# Patient Record
Sex: Female | Born: 1960
Health system: Southern US, Community
[De-identification: ages and names within clinical notes are randomized; demographics above are authoritative.]

## PROBLEM LIST (undated history)

## (undated) DIAGNOSIS — F419 Anxiety disorder, unspecified: Secondary | ICD-10-CM

## (undated) DIAGNOSIS — L719 Rosacea, unspecified: Secondary | ICD-10-CM

## (undated) DIAGNOSIS — M1990 Unspecified osteoarthritis, unspecified site: Secondary | ICD-10-CM

## (undated) DIAGNOSIS — J309 Allergic rhinitis, unspecified: Secondary | ICD-10-CM

## (undated) DIAGNOSIS — E2839 Other primary ovarian failure: Secondary | ICD-10-CM

## (undated) DIAGNOSIS — M47812 Spondylosis without myelopathy or radiculopathy, cervical region: Secondary | ICD-10-CM

## (undated) DIAGNOSIS — C801 Malignant (primary) neoplasm, unspecified: Secondary | ICD-10-CM

## (undated) DIAGNOSIS — E785 Hyperlipidemia, unspecified: Secondary | ICD-10-CM

## (undated) DIAGNOSIS — B009 Herpesviral infection, unspecified: Secondary | ICD-10-CM

## (undated) DIAGNOSIS — K5790 Diverticulosis of intestine, part unspecified, without perforation or abscess without bleeding: Secondary | ICD-10-CM

## (undated) DIAGNOSIS — G5600 Carpal tunnel syndrome, unspecified upper limb: Secondary | ICD-10-CM

## (undated) DIAGNOSIS — Z973 Presence of spectacles and contact lenses: Secondary | ICD-10-CM

## (undated) DIAGNOSIS — K219 Gastro-esophageal reflux disease without esophagitis: Secondary | ICD-10-CM

## (undated) DIAGNOSIS — C786 Secondary malignant neoplasm of retroperitoneum and peritoneum: Secondary | ICD-10-CM

## (undated) HISTORY — DX: Carpal tunnel syndrome, unspecified upper limb: G56.00

## (undated) HISTORY — DX: Other primary ovarian failure: E28.39

## (undated) HISTORY — DX: Anxiety disorder, unspecified: F41.9

## (undated) HISTORY — DX: Rosacea, unspecified: L71.9

## (undated) HISTORY — DX: Diverticulosis of intestine, part unspecified, without perforation or abscess without bleeding: K57.90

## (undated) HISTORY — DX: Spondylosis without myelopathy or radiculopathy, cervical region: M47.812

## (undated) HISTORY — PX: ANTERIOR CRUCIATE LIGAMENT REPAIR: SHX115

## (undated) HISTORY — DX: Hyperlipidemia, unspecified: E78.5

## (undated) HISTORY — DX: Allergic rhinitis, unspecified: J30.9

## (undated) HISTORY — DX: Herpesviral infection, unspecified: B00.9

---

## 2012-12-04 DIAGNOSIS — M5431 Sciatica, right side: Secondary | ICD-10-CM | POA: Insufficient documentation

## 2012-12-04 DIAGNOSIS — M1990 Unspecified osteoarthritis, unspecified site: Secondary | ICD-10-CM | POA: Insufficient documentation

## 2014-07-02 DIAGNOSIS — F419 Anxiety disorder, unspecified: Secondary | ICD-10-CM | POA: Insufficient documentation

## 2015-01-20 LAB — HM MAMMOGRAPHY

## 2015-02-06 LAB — HM PAP SMEAR: HM PAP: NEGATIVE

## 2016-01-02 DIAGNOSIS — C786 Secondary malignant neoplasm of retroperitoneum and peritoneum: Secondary | ICD-10-CM

## 2016-01-02 HISTORY — DX: Secondary malignant neoplasm of retroperitoneum and peritoneum: C78.6

## 2016-01-11 ENCOUNTER — Emergency Department
Admission: EM | Admit: 2016-01-11 | Discharge: 2016-01-11 | Disposition: A | Discharge status: Home / Self Care | Payer: BLUE CROSS/BLUE SHIELD | Attending: Emergency Medicine | Admitting: Emergency Medicine

## 2016-01-11 ENCOUNTER — Emergency Department: Payer: BLUE CROSS/BLUE SHIELD

## 2016-01-11 DIAGNOSIS — R1084 Generalized abdominal pain: Secondary | ICD-10-CM

## 2016-01-11 DIAGNOSIS — R188 Other ascites: Secondary | ICD-10-CM | POA: Diagnosis not present

## 2016-01-11 DIAGNOSIS — Z87891 Personal history of nicotine dependence: Secondary | ICD-10-CM | POA: Insufficient documentation

## 2016-01-11 DIAGNOSIS — R1010 Upper abdominal pain, unspecified: Secondary | ICD-10-CM | POA: Diagnosis present

## 2016-01-11 HISTORY — DX: Gastro-esophageal reflux disease without esophagitis: K21.9

## 2016-01-11 LAB — COMPREHENSIVE METABOLIC PANEL
ALK PHOS: 84 U/L (ref 38–126)
ALT: 11 U/L — ABNORMAL LOW (ref 14–54)
ANION GAP: 8 (ref 5–15)
AST: 17 U/L (ref 15–41)
Albumin: 3.5 g/dL (ref 3.5–5.0)
BUN: 10 mg/dL (ref 6–20)
CALCIUM: 8.8 mg/dL — AB (ref 8.9–10.3)
CHLORIDE: 102 mmol/L (ref 101–111)
CO2: 26 mmol/L (ref 22–32)
Creatinine, Ser: 0.72 mg/dL (ref 0.44–1.00)
Glucose, Bld: 93 mg/dL (ref 65–99)
Potassium: 3.7 mmol/L (ref 3.5–5.1)
SODIUM: 136 mmol/L (ref 135–145)
Total Bilirubin: 0.6 mg/dL (ref 0.3–1.2)
Total Protein: 7.2 g/dL (ref 6.5–8.1)

## 2016-01-11 LAB — CBC
HCT: 34.7 % — ABNORMAL LOW (ref 35.0–47.0)
HEMOGLOBIN: 11.9 g/dL — AB (ref 12.0–16.0)
MCH: 27.6 pg (ref 26.0–34.0)
MCHC: 34.4 g/dL (ref 32.0–36.0)
MCV: 80.2 fL (ref 80.0–100.0)
Platelets: 393 10*3/uL (ref 150–440)
RBC: 4.32 MIL/uL (ref 3.80–5.20)
RDW: 14.6 % — ABNORMAL HIGH (ref 11.5–14.5)
WBC: 7.1 10*3/uL (ref 3.6–11.0)

## 2016-01-11 LAB — URINALYSIS COMPLETE WITH MICROSCOPIC (ARMC ONLY)
BILIRUBIN URINE: NEGATIVE
Bacteria, UA: NONE SEEN
Glucose, UA: NEGATIVE mg/dL
HGB URINE DIPSTICK: NEGATIVE
LEUKOCYTES UA: NEGATIVE
Nitrite: NEGATIVE
PH: 5 (ref 5.0–8.0)
PROTEIN: 30 mg/dL — AB
SPECIFIC GRAVITY, URINE: 1.028 (ref 1.005–1.030)

## 2016-01-11 LAB — TROPONIN I: Troponin I: <0.03 ng/mL (ref <0.03)

## 2016-01-11 LAB — LIPASE, BLOOD: LIPASE: 23 U/L (ref 11–51)

## 2016-01-11 MED ORDER — IOPAMIDOL (ISOVUE-300) INJECTION 61%
100.0000 mL | Freq: Once | INTRAVENOUS | Status: AC | PRN
  Administered 2016-01-11: 100 mL via INTRAVENOUS

## 2016-01-11 MED ORDER — SODIUM CHLORIDE 0.9 % IV BOLUS (SEPSIS)
1000.0000 mL | Freq: Once | INTRAVENOUS | Status: AC
  Administered 2016-01-11: 1000 mL via INTRAVENOUS

## 2016-01-11 MED ORDER — DIATRIZOATE MEGLUMINE & SODIUM 66-10 % PO SOLN
15.0000 mL | Freq: Once | ORAL | Status: AC
  Administered 2016-01-11: 15 mL via ORAL

## 2016-01-11 NOTE — ED Notes (Signed)
Pt c/o upper abd pain, worse after eating and bloating, denies vomiting diarrhea. States having regular BM.Marland Kitchen States she was placed on protonix for acid reflux able 4 weeks ago. Also PCP was talking about possible gall bladder issues.

## 2016-01-11 NOTE — ED Provider Notes (Signed)
[facility] Emergency Department Provider Note   ____________________________________________  Time seen: Approximately 5 PM  I have reviewed the triage vital signs and the nursing notes.   HISTORY  Chief Complaint Abdominal Pain   HPI Valerie Horton is a 55 y.o. female with a history of acid reflux was presenting to the emergency department with abdominal pain. She says that the pain is been ongoing for several weeks. She says that she was placed on Protonix which helped a burning chest pain that she was having but the abdominal pain persisted. She says it worsens after eating and she often feels nauseous. She is not having any nausea or pain at this time. Says that the abdominal pain is also associated with distention. Says that she has also had loose stools but without bloody stools. Says that the pain is sharp and often radiates under her left breast. It is worsened with eating. She says that she was also put on simethicone which also has not helped with the abdominal pain. She is coming in the emergency department today after her husband brought her in because of concern for persistent symptoms.She said that she had a colonoscopy about a year ago which showed some diverticulosis as well as polyps. She says that this was a routine screening.   Past Medical History  Diagnosis Date  . Acid reflux     There are no active problems to display for this patient.   Past Surgical History  Procedure Laterality Date  . Cesarean section      No current outpatient prescriptions on file.  Allergies Review of patient's allergies indicates no known allergies.  No family history on file.  Social History Social History  Substance Use Topics  . Smoking status: Former Research scientist (life sciences)  . Smokeless tobacco: None  . Alcohol Use: Yes    Review of Systems Constitutional: No fever/chills Eyes: No visual changes. ENT: No sore throat. Cardiovascular: Denies chest  pain. Respiratory: Denies shortness of breath. Gastrointestinal:no vomiting.  No constipation. Genitourinary: Negative for dysuria. Musculoskeletal: Negative for back pain. Skin: Negative for rash. Neurological: Negative for headaches, focal weakness or numbness.  10-point ROS otherwise negative.  ____________________________________________   PHYSICAL EXAM:  VITAL SIGNS: ED Triage Vitals  Enc Vitals Group     BP 01/11/16 1520 125/55 mmHg     Pulse Rate 01/11/16 1520 93     Resp 01/11/16 1520 16     Temp 01/11/16 1520 98.5 F (36.9 C)     Temp Source 01/11/16 1520 Oral     SpO2 01/11/16 1520 98 %     Weight 01/11/16 1520 150 lb (68.04 kg)     Height 01/11/16 1520 5\' 3"  (1.6 m)     Head Cir --      Peak Flow --      Pain Score --      Pain Loc --      Pain Edu? --      Excl. in [place]? --     Constitutional: Alert and oriented. Well appearing and in no acute distress. Eyes: Conjunctivae are normal. PERRL. EOMI. Head: Atraumatic. Nose: No congestion/rhinnorhea. Mouth/Throat: Mucous membranes are moist.   Neck: No stridor.   Cardiovascular: Normal rate, regular rhythm. Grossly normal heart sounds.  Good peripheral circulation. Respiratory: Normal respiratory effort.  No retractions. Lungs CTAB. Gastrointestinal: Soft With lower abdominal tenderness to palpation without any rebound or guarding. The tenderness is across the lower abdomen. Mild distention. No CVA tenderness. Musculoskeletal:  No lower extremity tenderness nor edema.  No joint effusions. Neurologic:  Normal speech and language. No gross focal neurologic deficits are appreciated.  Skin:  Skin is warm, dry and intact. No rash noted. Psychiatric: Mood and affect are normal. Speech and behavior are normal.  ____________________________________________   LABS (all labs ordered are listed, but only abnormal results are displayed)  Labs Reviewed  COMPREHENSIVE METABOLIC PANEL - Abnormal; Notable for the following:     Calcium 8.8 (*)    ALT 11 (*)    All other components within normal limits  CBC - Abnormal; Notable for the following:    Hemoglobin 11.9 (*)    HCT 34.7 (*)    RDW 14.6 (*)    All other components within normal limits  URINALYSIS COMPLETEWITH MICROSCOPIC ([facility] ONLY) - Abnormal; Notable for the following:    Color, Urine YELLOW (*)    APPearance CLEAR (*)    Ketones, ur 1+ (*)    Protein, ur 30 (*)    Squamous Epithelial / LPF 0-5 (*)    All other components within normal limits  LIPASE, BLOOD  TROPONIN I  CA 125  CEA  CA 19-9 (SERIAL)   ____________________________________________  EKG  ED ECG REPORT I, Doran Stabler, the attending physician, personally viewed and interpreted this ECG.   Date: 01/11/2016  EKG Time: 1800  Rate: 82  Rhythm: normal sinus rhythm  Axis: Normal  Intervals:none  ST&T Change: No ST segment elevation or depression. No abnormal T-wave inversion.  ____________________________________________  RADIOLOGY  CT Abdomen Pelvis W Contrast (Final result) Result time: 01/11/16 19:42:04   Final result by Rad Results In Interface (01/11/16 19:42:04)   Narrative:   CLINICAL DATA: Generalized abdominal pain for 4 months. Distension. Bloating.  EXAM: CT ABDOMEN AND PELVIS WITH CONTRAST  TECHNIQUE: Multidetector CT imaging of the abdomen and pelvis was performed using the standard protocol following bolus administration of intravenous contrast.  CONTRAST: 134mL ISOVUE-300 IOPAMIDOL (ISOVUE-300) INJECTION 61%  COMPARISON: None.  FINDINGS: Lower chest: Mild bibasilar atelectasis.  Hepatobiliary: Liver appears normal. Gallbladder is unremarkable, difficult to definitively characterize due to surrounding fluid.  Pancreas: Normal.  Spleen: Within normal limits in size and appearance.  Adrenals/Urinary Tract: Adrenal glands appear normal. Kidneys appear normal without mass, stone or hydronephrosis. No ureteral or  bladder calculi identified.  Stomach/Bowel: Bowel is normal in caliber. No bowel wall thickening or bowel wall mass identified. Appendix is difficult to characterize, mostly obscured by adjacent fluid.  Vascular/Lymphatic: No pathologically enlarged lymph nodes. No evidence of abdominal aortic aneurysm.  Reproductive: No mass or other significant abnormality.  Other: Large volume free fluid throughout the abdomen and pelvis. Suspect nodular soft tissue thickening (omental caking) within the anterior mesentery, suggesting pseudomyxoma peritonei. No circumscribed fluid collection or abscess like collection. No free intraperitoneal air.  Musculoskeletal: No acute or suspicious osseous finding. Mild degenerative change within the lumbar spine. Superficial soft tissues are unremarkable.  IMPRESSION: 1. Large volume free fluid throughout the abdomen and pelvis. Suspect some nodular soft tissue thickening (omental caking) within the anterior mesentery. 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 No primary neoplastic mass identified. No adnexal mass seen. Appendix is mostly obscured by free fluid but does appear somewhat prominent where seen which could indicate a possible neoplastic source for a mucinous neoplasm. 2. Remainder of the exam is unremarkable, as detailed above. These results were called by telephone at the time of interpretation  on 01/11/2016 at 7:35 pm to Dr. Larae Grooms , who verbally acknowledged these results.   Electronically Signed By: Franki Cabot M.D.    ____________________________________________   PROCEDURES   Procedures  ____________________________________________   INITIAL IMPRESSION / ASSESSMENT AND PLAN / ED COURSE  Pertinent labs & imaging results that were available during my care of the patient were reviewed by me and considered in my medical decision  making (see chart for details).  ----------------------------------------- 8:06 PM on 01/11/2016 -----------------------------------------  I discussed case with Dr. Adonis Huguenin of surgery who says that the patient will likely need a paracentesis for analysis of the fluid. I also discussed case Dr. Grayland Ormond who took the patient's name and says that she should expect a call from his scheduling representative tomorrow. I explained the imaging to the patient as well as the family. She is aware of the large amount of free fluid as well as possible cancer causing this. She denies any weight loss. I feel that she'll be appropriate for outpatient follow-up with oncology in the office. She is aware that she will likely need a paracentesis for fluid sampling and further workup. As Dr. Grayland Ormond requested, I did send tumor markers. The patient understands the plan and is willing to comply. She says that if she does not see a call from the oncology office tomorrow by about midday that she will call for follow-up. She knows that she must be seen within one week. ____________________________________________   FINAL CLINICAL IMPRESSION(S) / ED DIAGNOSES  Abdominal pain with distention. Ascites.    NEW MEDICATIONS STARTED DURING THIS VISIT:  New Prescriptions   No medications on file     Note:  This document was prepared using Dragon voice recognition software and may include unintentional dictation errors.    Orbie Pyo, MD 01/11/16 2007

## 2016-01-11 NOTE — Discharge Instructions (Signed)
Abdominal Pain, Adult Many things can cause abdominal pain. Usually, abdominal pain is not caused by a disease and will improve without treatment. It can often be observed and treated at home. Your health care provider will do a physical exam and possibly order blood tests and X-rays to help determine the seriousness of your pain. However, in many cases, more time must pass before a clear cause of the pain can be found. Before that point, your health care provider may not know if you need more testing or further treatment. HOME CARE INSTRUCTIONS Monitor your abdominal pain for any changes. The following actions may help to alleviate any discomfort you are experiencing:  Only take over-the-counter or prescription medicines as directed by your health care provider.  Do not take laxatives unless directed to do so by your health care provider.  Try a clear liquid diet (broth, tea, or water) as directed by your health care provider. Slowly move to a bland diet as tolerated. SEEK MEDICAL CARE IF:  You have unexplained abdominal pain.  You have abdominal pain associated with nausea or diarrhea.  You have pain when you urinate or have a bowel movement.  You experience abdominal pain that wakes you in the night.  You have abdominal pain that is worsened or improved by eating food.  You have abdominal pain that is worsened with eating fatty foods.  You have a fever. SEEK IMMEDIATE MEDICAL CARE IF:  Your pain does not go away within 2 hours.  You keep throwing up (vomiting).  Your pain is felt only in portions of the abdomen, such as the right side or the left lower portion of the abdomen.  You pass bloody or black tarry stools. MAKE SURE YOU:  Understand these instructions.  Will watch your condition.  Will get help right away if you are not doing well or get worse.   This information is not intended to replace advice given to you by your health care provider. Make sure you discuss  any questions you have with your health care provider.   Document Released: 03/30/2005 Document Revised: 03/11/2015 Document Reviewed: 02/27/2013 Elsevier Interactive Patient Education 2016 Elsevier Inc[employer]  Ascites Ascites is a collection of excess fluid in the abdomen. Ascites can range from mild to severe. It can get worse without treatment. CAUSES Possible causes include:  Cirrhosis. This is the most common cause of ascites.  Infection or inflammation in the abdomen.  Cancer in the abdomen.  Heart failure.  Kidney disease.  Inflammation of the pancreas.  Clots in the veins of the liver. SIGNS AND SYMPTOMS Signs and symptoms may include:  A feeling of fullness in your abdomen. This is common.  An increase in the size of your abdomen or your waist.  Swelling in your legs.  Swelling of the scrotum in men.  Difficulty breathing.  Abdominal pain.  Sudden weight gain. If the condition is mild, you may not have symptoms. DIAGNOSIS To make a diagnosis, your health care provider will:  Ask about your medical history.  Perform a physical exam.  Order imaging tests, such as an ultrasound or CT scan of your abdomen. TREATMENT Treatment depends on the cause of the ascites. It may include:  Taking a pill to make you urinate. This is called a water pill (diuretic pill).  Strictly reducing your salt (sodium) intake. Salt can cause extra fluid to be kept in the body, and this makes ascites worse.  Having a procedure to remove fluid from your abdomen (  paracentesis).  Having a procedure to transfer fluid from your abdomen into a vein.  Having a procedure that connects two of the major veins within your liver and relieves pressure on your liver (TIPS procedure). Ascites may go away or improve with treatment of the condition that caused it.  HOME CARE INSTRUCTIONS  Keep track of your weight. To do this, weigh yourself at the same time every day and record your  weight.  Keep track of how much you drink and any changes in the amount you urinate.  Follow any instructions that your health care provider gives you about how much to drink.  Try not to eat salty (high-sodium) foods.  Take medicines only as directed by your health care provider.  Keep all follow-up visits as directed by your health care provider. This is important.  Report any changes in your health to your health care provider, especially if you develop new symptoms or your symptoms get worse. SEEK MEDICAL CARE IF:  Your gain more than 3 pounds in 3 days.  Your abdominal size or your waist size increases.  You have new swelling in your legs.  The swelling in your legs gets worse. SEEK IMMEDIATE MEDICAL CARE IF:  You develop a fever.  You develop confusion.  You develop new or worsening difficulty breathing.  You develop new or worsening abdominal pain.  You develop new or worsening swelling in the scrotum (in men).   This information is not intended to replace advice given to you by your health care provider. Make sure you discuss any questions you have with your health care provider.   Document Released: 06/20/2005 Document Revised: 07/11/2014 Document Reviewed: 01/17/2014 Elsevier Interactive Patient Education 2016 Elsevier Inc.   

## 2016-01-11 NOTE — ED Notes (Signed)
Discharge instructions reviewed with patient. Patient verbalized understanding. Patient ambulated to lobby without difficulty.   

## 2016-01-13 LAB — CA 19-9 (SERIAL): CA 19 9: 6 U/mL (ref 0–35)

## 2016-01-13 LAB — CEA: CEA: 1.2 ng/mL (ref 0.0–4.7)

## 2016-01-13 LAB — CA 125: CA 125: 191.9 U/mL — ABNORMAL HIGH (ref 0.0–38.1)

## 2016-01-14 ENCOUNTER — Inpatient Hospital Stay: Payer: BLUE CROSS/BLUE SHIELD | Attending: Oncology | Admitting: Oncology

## 2016-01-14 ENCOUNTER — Other Ambulatory Visit: Payer: Self-pay | Admitting: *Deleted

## 2016-01-14 VITALS — BP 132/84 | HR 111 | Temp 98.7°F | Resp 18 | Wt 150.4 lb

## 2016-01-14 DIAGNOSIS — R6881 Early satiety: Secondary | ICD-10-CM | POA: Diagnosis not present

## 2016-01-14 DIAGNOSIS — R971 Elevated cancer antigen 125 [CA 125]: Secondary | ICD-10-CM | POA: Diagnosis not present

## 2016-01-14 DIAGNOSIS — C801 Malignant (primary) neoplasm, unspecified: Secondary | ICD-10-CM | POA: Diagnosis not present

## 2016-01-14 DIAGNOSIS — B009 Herpesviral infection, unspecified: Secondary | ICD-10-CM | POA: Diagnosis not present

## 2016-01-14 DIAGNOSIS — R188 Other ascites: Secondary | ICD-10-CM

## 2016-01-14 DIAGNOSIS — C786 Secondary malignant neoplasm of retroperitoneum and peritoneum: Secondary | ICD-10-CM | POA: Insufficient documentation

## 2016-01-14 DIAGNOSIS — Z79899 Other long term (current) drug therapy: Secondary | ICD-10-CM | POA: Insufficient documentation

## 2016-01-14 DIAGNOSIS — R140 Abdominal distension (gaseous): Secondary | ICD-10-CM

## 2016-01-14 DIAGNOSIS — G893 Neoplasm related pain (acute) (chronic): Secondary | ICD-10-CM | POA: Diagnosis not present

## 2016-01-14 DIAGNOSIS — K219 Gastro-esophageal reflux disease without esophagitis: Secondary | ICD-10-CM | POA: Diagnosis not present

## 2016-01-14 DIAGNOSIS — Z87891 Personal history of nicotine dependence: Secondary | ICD-10-CM

## 2016-01-14 NOTE — Progress Notes (Signed)
  Oncology Nurse Navigator Documentation  Navigator Location: [facility] Onc (01/14/16 1600) Navigator Encounter Type: Initial MedOnc (01/14/16 1600)   Abnormal Finding Date: 01/11/16 (01/14/16 1600)       Patient Visit Type: MedOnc;Initial (01/14/16 1600) Treatment Phase: Abnormal Scans (01/14/16 1600) Barriers/Navigation Needs: Coordination of Care (01/14/16 1600)   Interventions: Coordination of Care (01/14/16 1600)   Coordination of Care: Appts (01/14/16 1600)        Acuity: Level 2 (01/14/16 1600)   Acuity Level 2: Initial guidance, education and coordination as needed;Educational needs;Assistance expediting appointments;Ongoing guidance and education throughout treatment as needed (01/14/16 1600)     Time Spent with Patient: 30 (01/14/16 1600)   Introduced Therapist, nutritional. Provided contact information for any further questions or concerns. Appr arranged with Dr Theora Gianotti Gyn Onc 7/19 at 1430. CT guided bx being arranged. Would be optimal get have bx prior to appt.

## 2016-01-14 NOTE — Progress Notes (Signed)
About 4 months ago symptoms started and pt began to experience heart burn, abdominal bloating, and abdominal fullness. Unable to eat due to always feeling full. Pt states has an appetite. Has occasional nausea with sharp, shooting, intermittent abdominal pain.

## 2016-01-15 ENCOUNTER — Other Ambulatory Visit: Payer: Self-pay | Admitting: Radiology

## 2016-01-18 ENCOUNTER — Ambulatory Visit (HOSPITAL_COMMUNITY)
Admission: RE | Admit: 2016-01-18 | Discharge: 2016-01-18 | Disposition: A | Discharge status: Home / Self Care | Payer: BLUE CROSS/BLUE SHIELD | Source: Ambulatory Visit | Attending: Oncology | Admitting: Oncology

## 2016-01-18 ENCOUNTER — Encounter (HOSPITAL_COMMUNITY): Payer: Self-pay

## 2016-01-18 ENCOUNTER — Ambulatory Visit (HOSPITAL_COMMUNITY)
Admission: RE | Admit: 2016-01-18 | Discharge: 2016-01-18 | Disposition: A | Discharge status: Home / Self Care | Payer: BLUE CROSS/BLUE SHIELD | Source: Ambulatory Visit | Attending: General Surgery | Admitting: General Surgery

## 2016-01-18 DIAGNOSIS — C801 Malignant (primary) neoplasm, unspecified: Secondary | ICD-10-CM | POA: Insufficient documentation

## 2016-01-18 DIAGNOSIS — C786 Secondary malignant neoplasm of retroperitoneum and peritoneum: Secondary | ICD-10-CM | POA: Diagnosis present

## 2016-01-18 DIAGNOSIS — Z7982 Long term (current) use of aspirin: Secondary | ICD-10-CM | POA: Insufficient documentation

## 2016-01-18 DIAGNOSIS — K219 Gastro-esophageal reflux disease without esophagitis: Secondary | ICD-10-CM | POA: Diagnosis not present

## 2016-01-18 DIAGNOSIS — Z87891 Personal history of nicotine dependence: Secondary | ICD-10-CM | POA: Diagnosis not present

## 2016-01-18 DIAGNOSIS — R188 Other ascites: Secondary | ICD-10-CM | POA: Diagnosis present

## 2016-01-18 LAB — CBC
HEMATOCRIT: 38.7 % (ref 36.0–46.0)
Hemoglobin: 12.5 g/dL (ref 12.0–15.0)
MCH: 26.8 pg (ref 26.0–34.0)
MCHC: 32.3 g/dL (ref 30.0–36.0)
MCV: 82.9 fL (ref 78.0–100.0)
Platelets: 417 10*3/uL — ABNORMAL HIGH (ref 150–400)
RBC: 4.67 MIL/uL (ref 3.87–5.11)
RDW: 14.7 % (ref 11.5–15.5)
WBC: 6.3 10*3/uL (ref 4.0–10.5)

## 2016-01-18 LAB — PROTIME-INR
INR: 1.13 (ref 0.00–1.49)
PROTHROMBIN TIME: 14.2 s (ref 11.6–15.2)

## 2016-01-18 LAB — APTT: APTT: 33 s (ref 24–37)

## 2016-01-18 MED ORDER — FENTANYL CITRATE (PF) 100 MCG/2ML IJ SOLN
INTRAMUSCULAR | Status: AC | PRN
  Administered 2016-01-18: 50 ug via INTRAVENOUS
  Administered 2016-01-18 (×2): 25 ug via INTRAVENOUS

## 2016-01-18 MED ORDER — MIDAZOLAM HCL 2 MG/2ML IJ SOLN
INTRAMUSCULAR | Status: AC
  Filled 2016-01-18: qty 6

## 2016-01-18 MED ORDER — FENTANYL CITRATE (PF) 100 MCG/2ML IJ SOLN
INTRAMUSCULAR | Status: AC
  Filled 2016-01-18: qty 4

## 2016-01-18 MED ORDER — MIDAZOLAM HCL 2 MG/2ML IJ SOLN
INTRAMUSCULAR | Status: AC | PRN
  Administered 2016-01-18 (×5): 1 mg via INTRAVENOUS

## 2016-01-18 MED ORDER — SODIUM CHLORIDE 0.9 % IV SOLN
INTRAVENOUS | Status: DC
  Administered 2016-01-18: 09:00:00 via INTRAVENOUS

## 2016-01-18 NOTE — Procedures (Signed)
Successful US guided paracentesis yielding 1.1 L of serous ascitic fluid. Sample sent to laboratory as requested.  Technically successful CT guided biopsy of omental caking   EBL: Minimal  No immediate post procedural complications.   Ronny Bacon, MD Pager #: [phone]

## 2016-01-18 NOTE — Progress Notes (Signed)
Spoke with Saverio Danker, PA re: patient current BP lower than admitting BP, patient asymptomatic after ambulation, no c/o feeling dizzy or nauseated.  OK to discharge if patient is asymptomatic.  Discussed sitting on the side of the bed before rising and standing for a few minutes before walking to prevent orthostatic hypotension.  Drink plenty of fluids to prevent hypotension.  Advised to seek medical assistance if feeling dizzy, nauseated or develop uncontrolled pain.  Patient and Ronalee Belts (husband) verbalized understanding.

## 2016-01-18 NOTE — Discharge Instructions (Signed)
Paracentesis Paracentesis is a procedure to remove excess fluid (ascites) from the belly (abdomen). Ascites can result from certain conditions, such as infection, inflammation, abdominal injury, heart failure, chronic scarring of the liver (cirrhosis), or cancer. Ascites is removed using a needle that is inserted through the skin and tissue into the abdomen. This procedure may be done:  To determine the cause of the ascites.  To relieve symptoms that are caused by the ascites, such as pain or shortness of breath.  To see if there is bleeding after an abdominal injury. LET [facility] CARE PROVIDER KNOW ABOUT:  Any allergies you have.  All medicines you are taking, including vitamins, herbs, eye drops, creams, and over-the-counter medicines.  Previous problems you or members of your family have had with the use of anesthetics.  Any blood disorders you have.  Previous surgeries you have had.  Any medical conditions you have.  Whether you are pregnant or may be pregnant. RISKS AND COMPLICATIONS Generally, this is a safe procedure. However, problems may occur, including:  Infection.  Bleeding.  Injury to an abdominal organ, such as the bowel (large intestine), liver, spleen, or bladder.  Low blood pressure (hypotension).  Spreading of cancer, if there are cancer cells in the abdominal fluid.  Mental status changes in people who have liver disease. These changes would be caused by shifts in the balance of fluids and minerals (electrolytes) in the body. BEFORE THE PROCEDURE  Ask your health care provider about:  Changing or stopping your regular medicines. This is especially important if you are taking diabetes medicines or blood thinners.  Taking medicines such as aspirin and ibuprofen. These medicines can thin your blood. Do not take these medicines before your procedure if your health care provider instructs you not to.  A blood sample may be done to determine your blood  clotting time.  You will be asked to urinate. PROCEDURE  You may be asked to lie on your back with your head raised (elevated).  To reduce your risk of infection:  Your health care team will wash or sanitize their hands.  Your skin will be washed with soap.  You will be given a medicine to numb the area (local anesthetic).  Your abdominal skin will be punctured with a needle or a scalpel.  A drainage tube will be inserted through the puncture site. Fluid will drain through the tube into a container.  After enough fluid has been removed, the tube will be removed.  A sample of the fluid will be sent for examination.  A bandage (dressing) will be placed over the puncture site. The procedure may vary among health care providers and hospitals. AFTER THE PROCEDURE  It is your responsibility to get your test results. Ask your health care provider or the department performing the test when your results will be ready.   This information is not intended to replace advice given to you by your health care provider. Make sure you discuss any questions you have with your health care provider.   Document Released: 01/03/2005 Document Revised: 03/11/2015 Document Reviewed: 09/02/2014 Elsevier Interactive Patient Education 2016 [place]. Paracentesis, Care After Refer to this sheet in the next few weeks. These instructions provide you with information about caring for yourself after your procedure. Your health care provider may also give you more specific instructions. Your treatment has been planned according to current medical practices, but problems sometimes occur. Call your health care provider if you have any problems or questions after  your procedure. WHAT TO EXPECT AFTER THE PROCEDURE After your procedure, it is common to have a small amount of clear fluid coming from the puncture site. HOME CARE INSTRUCTIONS  Return to your normal activities as told by your health care provider.  Ask your health care provider what activities are safe for you.  Take over-the-counter and prescription medicines only as told by your health care provider.  Do not take baths, swim, or use a hot tub until your health care provider approves.  Follow instructions from your health care provider about:  How to take care of your puncture site.  When and how you should change your bandage (dressing).  When you should remove your dressing.  Check your puncture area every day signs of infection. Watch for:  Redness, swelling, or pain.  Fluid, blood, or pus.  Keep all follow-up visits as told by your health care provider. This is important. SEEK MEDICAL CARE IF:  You have redness, swelling, or pain at your puncture site.  You start to have more clear fluid coming from your puncture site.  You have blood or pus coming from your puncture site.  You have chills.  You have a fever. SEEK IMMEDIATE MEDICAL CARE IF:  You develop chest pain or shortness of breath.  You develop increasing pain, discomfort, or swelling in your abdomen.  You feel dizzy or light-headed or you pass out.   This information is not intended to replace advice given to you by your health care provider. Make sure you discuss any questions you have with your health care provider.   Document Released: 11/04/2014 Document Reviewed: 11/04/2014 Elsevier Interactive Patient Education 2016 [employer] Needle Biopsy, Care After These instructions give you information about caring for yourself after your procedure. Your doctor may also give you more specific instructions. Call your doctor if you have any problems or questions after your procedure. HOME CARE  Rest as told by your doctor.  Take medicines only as told by your doctor.  There are many different ways to close and cover the biopsy site, including stitches (sutures), skin glue, and adhesive strips. Follow instructions from your doctor about:  How to  take care of your biopsy site.  When and how you should change your bandage (dressing).  When you should remove your dressing.  Removing whatever was used to close your biopsy site.  Check your biopsy site every day for signs of infection. Watch for:  Redness, swelling, or pain.  Fluid, blood, or pus. GET HELP IF:  You have a fever.  You have redness, swelling, or pain at the biopsy site, and it lasts longer than a few days.  You have fluid, blood, or pus coming from the biopsy site.  You feel sick to your stomach (nauseous).  You throw up (vomit). GET HELP RIGHT AWAY IF:  You are short of breath.  You have trouble breathing.  Your chest hurts.  You feel dizzy or you pass out (faint).  You have bleeding that does not stop with pressure or a bandage.  You cough up blood.  Your belly (abdomen) hurts.   This information is not intended to replace advice given to you by your health care provider. Make sure you discuss any questions you have with your health care provider.   Document Released: 06/02/2008 Document Revised: 11/04/2014 Document Reviewed: 06/16/2014 Elsevier Interactive Patient Education 2016 [employer]  Moderate Conscious Sedation, Adult, Care After Refer to this sheet in the next few weeks. These  instructions provide you with information on caring for yourself after your procedure. Your health care provider may also give you more specific instructions. Your treatment has been planned according to current medical practices, but problems sometimes occur. Call your health care provider if you have any problems or questions after your procedure. WHAT TO EXPECT AFTER THE PROCEDURE  After your procedure:  You may feel sleepy, clumsy, and have poor balance for several hours.  Vomiting may occur if you eat too soon after the procedure. HOME CARE INSTRUCTIONS  Do not participate in any activities where you could become injured for at least 24 hours. Do  not:  Drive.  Swim.  Ride a bicycle.  Operate heavy machinery.  Cook.  Use power tools.  Climb ladders.  Work from a high place.  Do not make important decisions or sign legal documents until you are improved.  If you vomit, drink water, juice, or soup when you can drink without vomiting. Make sure you have little or no nausea before eating solid foods.  Only take over-the-counter or prescription medicines for pain, discomfort, or fever as directed by your health care provider.  Make sure you and your family fully understand everything about the medicines given to you, including what side effects may occur.  You should not drink alcohol, take sleeping pills, or take medicines that cause drowsiness for at least 24 hours.  If you smoke, do not smoke without supervision.  If you are feeling better, you may resume normal activities 24 hours after you were sedated.  Keep all appointments with your health care provider. SEEK MEDICAL CARE IF:  Your skin is pale or bluish in color.  You continue to feel nauseous or vomit.  Your pain is getting worse and is not helped by medicine.  You have bleeding or swelling.  You are still sleepy or feeling clumsy after 24 hours. SEEK IMMEDIATE MEDICAL CARE IF:  You develop a rash.  You have difficulty breathing.  You develop any type of allergic problem.  You have a fever. MAKE SURE YOU:  Understand these instructions.  Will watch your condition.  Will get help right away if you are not doing well or get worse.   This information is not intended to replace advice given to you by your health care provider. Make sure you discuss any questions you have with your health care provider.   Document Released: 04/10/2013 Document Revised: 07/11/2014 Document Reviewed: 04/10/2013 Elsevier Interactive Patient Education [employer].

## 2016-01-18 NOTE — H&P (Signed)
Chief Complaint: omental caking, needs biopsy  Referring Physician:Dr. Juventino Slovak  Supervising Physician: Sandi Mariscal  Patient Status: Out-pt  HPI: Valerie Horton is an 55 y.o. female who has had abdominal bloating for the last several months.  She finally went to see her PCP who ordered a CT scan last week.  This revealed a large volume of free fluid along with nodular soft tissue thickening, or omental caking, within the anterior mesentery.  She was referred to oncology who has contacted [place] for an omental biopsy to help determine etiology.  The patient has no new complaints except for her chronic abdominal bloating and some intermittent discomfort.  Past Medical History:  Past Medical History  Diagnosis Date  . Acid reflux     Past Surgical History:  Past Surgical History  Procedure Laterality Date  . Cesarean section      Family History: History reviewed. No pertinent family history.  Social History:  reports that she has quit smoking. She does not have any smokeless tobacco history on file. She reports that she drinks alcohol. Her drug history is not on file.  Allergies: No Known Allergies  Medications:   Medication List    ASK your doctor about these medications        acyclovir 800 MG tablet  Commonly known as:  ZOVIRAX  Take 800 mg by mouth 3 (three) times a week.     Biotin 1000 MCG Chew  Chew 1 tablet by mouth daily.     escitalopram 10 MG tablet  Commonly known as:  LEXAPRO  Take 10 mg by mouth daily.     ibuprofen 200 MG tablet  Commonly known as:  ADVIL,MOTRIN  Take 200 mg by mouth every 6 (six) hours as needed.     pantoprazole 40 MG tablet  Commonly known as:  PROTONIX  Take 40 mg by mouth daily.     Vitamin D3 2000 units Tabs  Take 1 tablet by mouth daily.        Please HPI for pertinent positives, otherwise complete 10 system ROS negative.  Mallampati Score: MD Evaluation Airway: WNL Heart: WNL Abdomen: WNL Chest/ Lungs:  WNL ASA  Classification: 2 Mallampati/Airway Score: Two  Physical Exam: BP 119/54 mmHg  Pulse 89  Temp(Src) 98.4 F (36.9 C) (Oral)  Resp 16  Wt 149 lb (67.586 kg)  SpO2 100% Body mass index is 26.4 kg/(m^2). General: pleasant, WD, WN white female who is laying in bed in NAD HEENT: head is normocephalic, atraumatic.  Sclera are noninjected.  PERRL.  Ears and nose without any masses or lesions.  Mouth is pink and moist Heart: regular, rate, and rhythm.  Normal s1,s2. No obvious murmurs, gallops, or rubs noted.  Palpable radial and pedal pulses bilaterally Lungs: CTAB, no wheezes, rhonchi, or rales noted.  Respiratory effort nonlabored Abd: soft, NT, bloated/distended, +BS, no masses, hernias, or organomegaly MS: all 4 extremities are symmetrical with no cyanosis, clubbing, or edema. Psych: A&Ox3 with an appropriate affect.   Labs: Results for orders placed or performed during the hospital encounter of 01/18/16 (from the past 48 hour(s))  APTT upon arrival     Status: None   Collection Time: 01/18/16  9:20 AM  Result Value Ref Range   aPTT 33 24 - 37 seconds  CBC upon arrival     Status: Abnormal   Collection Time: 01/18/16  9:20 AM  Result Value Ref Range   WBC 6.3 4.0 - 10.5 K/uL  RBC 4.67 3.87 - 5.11 MIL/uL   Hemoglobin 12.5 12.0 - 15.0 g/dL   HCT 38.7 36.0 - 46.0 %   MCV 82.9 78.0 - 100.0 fL   MCH 26.8 26.0 - 34.0 pg   MCHC 32.3 30.0 - 36.0 g/dL   RDW 14.7 11.5 - 15.5 %   Platelets 417 (H) 150 - 400 K/uL  Protime-INR upon arrival     Status: None   Collection Time: 01/18/16  9:20 AM  Result Value Ref Range   Prothrombin Time 14.2 11.6 - 15.2 seconds   INR 1.13 0.00 - 1.49    Imaging: No results found.  Assessment/Plan 1. Omental caking, etiology unclear -we will plan to proceed with an omental biopsy today.   -labs and vitals have been reviewed -Risks and Benefits discussed with the patient including, but not limited to bleeding, infection, damage to adjacent  structures or low yield requiring additional tests. All of the patient's questions were answered, patient is agreeable to proceed. Consent signed and in chart.   Thank you for this interesting consult.  I greatly enjoyed meeting Valerie Horton and look forward to participating in their care.  A copy of this report was sent to the requesting provider on this date.  Electronically Signed: Henreitta Cea 01/18/2016, 10:32 AM   I spent a total of  30 Minutes  in face to face in clinical consultation, greater than 50% of which was counseling/coordinating care for omental caking

## 2016-01-20 ENCOUNTER — Inpatient Hospital Stay (HOSPITAL_BASED_OUTPATIENT_CLINIC_OR_DEPARTMENT_OTHER): Payer: BLUE CROSS/BLUE SHIELD | Admitting: Obstetrics and Gynecology

## 2016-01-20 ENCOUNTER — Encounter: Payer: Self-pay | Admitting: Obstetrics and Gynecology

## 2016-01-20 VITALS — BP 117/66 | HR 87 | Temp 98.1°F | Ht 64.0 in | Wt 151.0 lb

## 2016-01-20 DIAGNOSIS — C786 Secondary malignant neoplasm of retroperitoneum and peritoneum: Secondary | ICD-10-CM

## 2016-01-20 DIAGNOSIS — Z79899 Other long term (current) drug therapy: Secondary | ICD-10-CM

## 2016-01-20 DIAGNOSIS — R6881 Early satiety: Secondary | ICD-10-CM

## 2016-01-20 DIAGNOSIS — K219 Gastro-esophageal reflux disease without esophagitis: Secondary | ICD-10-CM

## 2016-01-20 DIAGNOSIS — C801 Malignant (primary) neoplasm, unspecified: Secondary | ICD-10-CM

## 2016-01-20 DIAGNOSIS — R978 Other abnormal tumor markers: Secondary | ICD-10-CM

## 2016-01-20 DIAGNOSIS — B009 Herpesviral infection, unspecified: Secondary | ICD-10-CM

## 2016-01-20 DIAGNOSIS — R140 Abdominal distension (gaseous): Secondary | ICD-10-CM

## 2016-01-20 DIAGNOSIS — Z87891 Personal history of nicotine dependence: Secondary | ICD-10-CM

## 2016-01-20 DIAGNOSIS — G893 Neoplasm related pain (acute) (chronic): Secondary | ICD-10-CM

## 2016-01-20 DIAGNOSIS — R971 Elevated cancer antigen 125 [CA 125]: Secondary | ICD-10-CM | POA: Diagnosis not present

## 2016-01-20 DIAGNOSIS — R188 Other ascites: Secondary | ICD-10-CM

## 2016-01-20 NOTE — Progress Notes (Signed)
Gynecologic Oncology Consult Visit   Referring Provider: Delight Hoh, MD  Chief Concern: peritoneal carcinomatosis concern for ovarian malignancy  Subjective:  Valerie Horton is a 55 y.o. female who is seen in consultation from Dr. Grayland Ormond for peritoneal carcinomatosis.  Patient is a pleasant patient who started developing symptoms of GERD which progressed to abdominal pain, bloating and fullness over the previous four months. She also complained of early satiety. Subsequent evaluation in the emergency room reviewed peritoneal carcinomatosis, omental caking and significant ascites. She had a colonoscopy in 2016 and had benign polyps removed per her report. All Paps have been normal and last Pap was 2016.   Lab Results  Component Value Date   CA125 191.9* 01/11/2016   Lab Results  Component Value Date   CEA 1.2 01/11/2016   CA19-9 = 6  Albumin = 3.5   CT scan 01/11/2016 IMPRESSION: Large volume free fluid throughout the abdomen and pelvis. Suspect some nodular soft tissue thickening (omental caking) within the anterior mesentery. 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 No primary neoplastic mass identified. No adnexal mass seen. Appendix is mostly obscured by free fluid but does appear somewhat prominent where seen which could indicate a possible neoplastic source for a mucinous neoplasm.  She underwent IR paracentesis and cytology is pending.   She was seen by Dr. Grayland Ormond. He has ordered a biopsy that has been completed. Results received during clinic visit: metastatic adenocarcinoma with signet ring cells and extracellular mucin: positive CK20 and CDX2; negative for CK7, PAX8, ER, chromogranin, CD56, and synaptophysin  Problem List: Patient Active Problem List   Diagnosis Date Noted  . Peritoneal carcinomatosis ([place]) 01/20/2016    Past Medical History: Past Medical History   Diagnosis Date  . Acid reflux     Past Surgical History: Past Surgical History  Procedure Laterality Date  . Cesarean section    . Anterior cruciate ligament repair Left     Past Gynecologic History:  Menarche: 12 History of Abnormal pap: No Last pap: 2016 History of STDs: Herpes   OB History:  OB History  Gravida Para Term Preterm AB SAB TAB Ectopic Multiple Living  3 2   1     3     # Outcome Date GA Lbr Len/2nd Weight Sex Delivery Anes PTL Lv  3 AB           2 Para      CS-Unspec        Comments: Twins  1 Para               Family History: Adopted  Social History: Social History   Social History  . Marital Status: Married    Spouse Name: N/A  . Number of Children: 3  . Years of Education: N/A   Occupational History  . Homemaker    Social History Main Topics  . Smoking status: Former Research scientist (life sciences)  . Smokeless tobacco: Not on file     Comment: Quit in her 20's  . Alcohol Use: 0.0 oz/week    0 Standard drinks or equivalent per week  . Drug Use: Not on file  . Sexual Activity: Not on file   Other Topics Concern  . Not on file   Social History Narrative    Allergies: No Known Allergies  Current Medications: Current Outpatient Prescriptions  Medication Sig Dispense Refill  . acyclovir (ZOVIRAX) 800 MG tablet Take 800 mg by mouth 3 (three) times  a week.    . Biotin 1000 MCG CHEW Chew 1 tablet by mouth daily.    . Cholecalciferol (VITAMIN D3) 2000 units TABS Take 1 tablet by mouth daily.    Marland Kitchen escitalopram (LEXAPRO) 10 MG tablet Take 10 mg by mouth daily.    Marland Kitchen ibuprofen (ADVIL,MOTRIN) 200 MG tablet Take 200 mg by mouth every 6 (six) hours as needed.    . pantoprazole (PROTONIX) 40 MG tablet Take 40 mg by mouth daily.     No current facility-administered medications for this visit.    ROS: General: weight loss/gain  HEENT: no complaints  Lungs: no complaints  Cardiac: no complaints  GI: GERD, abdominal swelling, bloating, and discomfort. Change in  stool caliber with narrowing and mucous like stool. No BRBPR or hematochezia.   GU: no complaints; no abnormal vaginal bleeding  Musculoskeletal: no complaints  Extremities: no complaints  Skin: no complaints  Neuro: no complaints  Endocrine: no complaints  Psych: no complaints       Objective:  Physical Examination:  BP 117/66 mmHg  Pulse 87  Temp(Src) 98.1 F (36.7 C) (Tympanic)  Ht 5\' 4"  (1.626 m)  Wt 151 lb 0.2 oz (68.5 kg)  BMI 25.91 kg/m2   ECOG Performance Status: 1 - Symptomatic but completely ambulatory  General appearance: alert, cooperative and appears stated age HEENT:PERRLA, extra ocular movement intact and sclera clear, anicteric Lymph node survey: non-palpable, axillary, inguinal, supraclavicular Cardiovascular: regular rate and rhythm Respiratory: normal air entry, lungs clear to auscultation Abdomen: soft, protuberant, distended, nontender, no masses palpated, no hepatosplenomegaly, positive ascites, no hernias  Back: inspection of back is normal Extremities: extremities normal, atraumatic, no cyanosis or edema Skin exam - normal coloration and turgor, no rashes, no suspicious skin lesions noted. Neurological exam reveals alert, oriented, normal speech, no focal findings or movement disorder noted.  Pelvic: exam chaperoned by nurse;  Vulva: normal appearing vulva with no masses, tenderness or lesions; Vagina: normal vagina; Adnexa:fullness bilaterally with possible nodularity on the right; Uterus: uterus is normal size, shape, consistency and nontender; Cervix: no lesions; Rectal: confirmatory with nodular mass anterior surface of the rectum    Lab Review Labs on site today: reviewed  Radiologic Imaging: Reviewed with patient and her husband    Assessment:  Valerie Horton is a 55 y.o. female diagnosed with peritoneal carcinomatosis, elevated CA125 and omental caking with signet ring cell on biopsy and findings concerning for metastatic GI malignancy.   Plan:   Problem List Items Addressed This Visit      Other   Peritoneal carcinomatosis ([place]) - Primary    Other Visit Diagnoses    Elevated tumor markers        Ascites           We discussed options for management with Dr. Rogue Bussing. He recommended chest CT to complete metastatic evaluation as well as endoscopy and colonoscopy. We will order these tests. She is scheduled to see Dr. Grayland Ormond on 01/25/2016.   The patient's diagnosis, an outline of the further diagnostic and laboratory studies which will be required, the recommendation, and alternatives were discussed.  All questions were answered to the patient's satisfaction.  We will follow up as needed.   Gillis Ends, MD    CC:  Dr. Delight Hoh

## 2016-01-20 NOTE — Progress Notes (Addendum)
[place]  [phone] [phone]  ID: Valerie Horton OB: [birth year]  MR#: [id number]  CSN#:[id number]  Patient Care Team: Provider Not In System as PCP - General  CHIEF COMPLAINT: Peritoneal carcinomatosis  INTERVAL HISTORY: Patient is a 55 year old female who started developing symptoms of abdominal pain, bloating and fullness over the previous four months. She also complained of early satiety.  Subsequent evaluation in the emergency room reviewed peritoneal carcinomatosis, omental caking and significant ascites. Patient also noted to have an increased ca-125.  Currently, she is anxious but otherwise feels well.  She has no neurological complains.  She denies any recent fevers or illnesses.  She has no chest pain or shortness of breath.  She denies and nausea, vomiting, constipation, or diarrhea. She has no melena or hematochezia. She has no urinary complains.  Patient offer no further specific complaints.  REVIEW OF SYSTEMS:   Review of Systems  Constitutional: Negative for fever, weight loss and malaise/fatigue.  Respiratory: Negative.  Negative for cough and shortness of breath.   Cardiovascular: Negative.  Negative for chest pain.  Gastrointestinal: Positive for abdominal pain. Negative for nausea, vomiting, diarrhea, constipation, blood in stool and melena.  Genitourinary: Negative.   Musculoskeletal: Negative.   Neurological: Negative.  Negative for weakness.  Psychiatric/Behavioral: The patient is nervous/anxious.     As per HPI. Otherwise, a complete review of systems is negatve.  PAST MEDICAL HISTORY: Past Medical History  Diagnosis Date  . Acid reflux     PAST SURGICAL HISTORY: Past Surgical History  Procedure Laterality Date  . Cesarean section      FAMILY HISTORY: Unknown.  Patient is adopted.     ADVANCED DIRECTIVES:    HEALTH MAINTENANCE: Social History  Substance Use Topics  . Smoking status: Former Research scientist (life sciences)  .  Smokeless tobacco: Not on file  . Alcohol Use: Yes     Colonoscopy:  PAP:  Bone density:  Lipid panel:  No Known Allergies  Current Outpatient Prescriptions  Medication Sig Dispense Refill  . acyclovir (ZOVIRAX) 800 MG tablet Take 800 mg by mouth 3 (three) times a week.    . Biotin 1000 MCG CHEW Chew 1 tablet by mouth daily.    . Cholecalciferol (VITAMIN D3) 2000 units TABS Take 1 tablet by mouth daily.    Marland Kitchen escitalopram (LEXAPRO) 10 MG tablet Take 10 mg by mouth daily.    Marland Kitchen ibuprofen (ADVIL,MOTRIN) 200 MG tablet Take 200 mg by mouth every 6 (six) hours as needed.    . pantoprazole (PROTONIX) 40 MG tablet Take 40 mg by mouth daily.     No current facility-administered medications for this visit.    OBJECTIVE: Filed Vitals:   01/14/16 1452  BP: 132/84  Pulse: 111  Temp: 98.7 F (37.1 C)  Resp: 18     Body mass index is 26.64 kg/(m^2).    ECOG FS:1 - Symptomatic but completely ambulatory  General: Well-developed, well-nourished, no acute distress. Eyes: Pink conjunctiva, anicteric sclera. HEENT: Normocephalic, moist mucous membranes, clear oropharnyx. Lungs: Clear to auscultation bilaterally. Heart: Regular rate and rhythm. No rubs, murmurs, or gallops. Abdomen: Soft, nontender, mildly distended.  Musculoskeletal: No edema, cyanosis, or clubbing. Neuro: Alert, answering all questions appropriately. Cranial nerves grossly intact. Skin: No rashes or petechiae noted. Psych: Normal affect. Lymphatics: No cervical, calvicular, axillary or inguinal LAD.   LAB RESULTS:  Lab Results  Component Value Date   NA 136 01/11/2016   K 3.7 01/11/2016   CL 102  01/11/2016   CO2 26 01/11/2016   GLUCOSE 93 01/11/2016   BUN 10 01/11/2016   CREATININE 0.72 01/11/2016   CALCIUM 8.8* 01/11/2016   PROT 7.2 01/11/2016   ALBUMIN 3.5 01/11/2016   AST 17 01/11/2016   ALT 11* 01/11/2016   ALKPHOS 84 01/11/2016   BILITOT 0.6 01/11/2016   GFRNONAA >60 01/11/2016   GFRAA >60 01/11/2016      Lab Results  Component Value Date   WBC 6.3 01/18/2016   HGB 12.5 01/18/2016   HCT 38.7 01/18/2016   MCV 82.9 01/18/2016   PLT 417* 01/18/2016   Lab Results  Component Value Date   CA125 191.9* 01/11/2016     STUDIES: Ct Abdomen Pelvis W Contrast  01/11/2016  CLINICAL DATA:  Generalized abdominal pain for 4 months. Distension. Bloating. EXAM: CT ABDOMEN AND PELVIS WITH CONTRAST TECHNIQUE: Multidetector CT imaging of the abdomen and pelvis was performed using the standard protocol following bolus administration of intravenous contrast. CONTRAST:  157mL ISOVUE-300 IOPAMIDOL (ISOVUE-300) INJECTION 61% COMPARISON:  None. FINDINGS: Lower chest:  Mild bibasilar atelectasis. Hepatobiliary: Liver appears normal. Gallbladder is unremarkable, difficult to definitively characterize due to surrounding fluid. Pancreas: Normal. Spleen: Within normal limits in size and appearance. Adrenals/Urinary Tract: Adrenal glands appear normal. Kidneys appear normal without mass, stone or hydronephrosis. No ureteral or bladder calculi identified. Stomach/Bowel: Bowel is normal in caliber. No bowel wall thickening or bowel wall mass identified. Appendix is difficult to characterize, mostly obscured by adjacent fluid. Vascular/Lymphatic: No pathologically enlarged lymph nodes. No evidence of abdominal aortic aneurysm. Reproductive: No mass or other significant abnormality. Other: Large volume free fluid throughout the abdomen and pelvis. Suspect nodular soft tissue thickening (omental caking) within the anterior mesentery, suggesting pseudomyxoma peritonei. No circumscribed fluid collection or abscess like collection. No free intraperitoneal air. Musculoskeletal: No acute or suspicious osseous finding. Mild degenerative change within the lumbar spine. Superficial soft tissues are unremarkable. IMPRESSION: 1. Large volume free fluid throughout the abdomen and pelvis. Suspect some nodular soft tissue thickening (omental  caking) within the anterior mesentery. 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 No primary neoplastic mass identified. No adnexal mass seen. Appendix is mostly obscured by free fluid but does appear somewhat prominent where seen which could indicate a possible neoplastic source for a mucinous neoplasm. 2. Remainder of the exam is unremarkable, as detailed above. These results were called by telephone at the time of interpretation on 01/11/2016 at 7:35 pm to Dr. Larae Grooms , who verbally acknowledged these results. Electronically Signed   By: Franki Cabot M.D.   On: 01/11/2016 19:42   Ct Biopsy  01/18/2016  INDICATION: No known primary, now with omental caking and presumably malignant ascites. Please perform ultrasound-guided paracentesis and CT-guided omental mass biopsy for tissue diagnostic purposes. EXAM: 1. CT-GUIDED BIOPSY OF OMENTAL CAKING 2. IR PARACENTESIS COMPARISON:  CT of the abdomen and pelvis - 01/11/2016 MEDICATIONS: None ANESTHESIA/SEDATION: Fentanyl 50 mcg IV; Versed 2 mg IV Sedation time: 19 minutes; The patient was continuously monitored during the procedure by the interventional radiology nurse under my direct supervision. CONTRAST:  None COMPLICATIONS: None immediate. PROCEDURE: Informed consent was obtained from the patient following an explanation of the procedure, risks, benefits and alternatives. A time out was performed prior to the initiation of the procedure. The patient was positioned supine on the CT table. Attention was initially paid towards the paracentesis. Initial ultrasound scanning demonstrates a moderate amount of ascites within the  right right mid abdominal quadrant. The right mid abdomen was prepped and draped in the usual sterile fashion. 1% lidocaine with epinephrine was used for local anesthesia. Under direct ultrasound guidance, a 19 gauge, 7-cm, Yueh catheter was introduced. An  ultrasound image was saved for documentation purposed. The paracentesis was performed. All aspirated fluid was sent to the laboratory for cytologic analysis. The catheter was removed and a dressing was applied. The patient tolerated the procedure well without immediate post procedural complication. Attention was now paid towards the CT-guided omental mass biopsy. Limited CT was performed for procedural planning demonstrating unchanged appearance of omental caking with dominant component centered within the ventral aspect of the lower abdomen / upper pelvis. The procedure was planned. The operative site was prepped and draped in the usual sterile fashion. Appropriate trajectory was confirmed with a 22 gauge spinal needle after the adjacent tissues were anesthetized with 1% Lidocaine with epinephrine. Under intermittent CT guidance, a 17 gauge coaxial needle was advanced into the peripheral aspect of the omental caking Appropriate positioning was confirmed and a core needle biopsy samples were obtained with an 18 gauge core needle biopsy device. The co-axial needle was removed and hemostasis was achieved with manual compression. A limited postprocedural CT was negative for hemorrhage or additional complication. A dressing was placed. The patient tolerated the procedure well without immediate postprocedural complication. IMPRESSION: 1. Technically successful CT guided core needle biopsy of omental caking. 2. Technically successful ultrasound-guided paracentesis yielding 1.1 L of serous fluid. Aspirated fluid was sent to the laboratory for cytologic analysis. Electronically Signed   By: Sandi Mariscal M.D.   On: 01/18/2016 13:11   Ir Paracentesis  01/18/2016  INDICATION: No known primary, now with omental caking and presumably malignant ascites. Please perform ultrasound-guided paracentesis and CT-guided omental mass biopsy for tissue diagnostic purposes. EXAM: 1. CT-GUIDED BIOPSY OF OMENTAL CAKING 2. IR PARACENTESIS  COMPARISON:  CT of the abdomen and pelvis - 01/11/2016 MEDICATIONS: None ANESTHESIA/SEDATION: Fentanyl 50 mcg IV; Versed 2 mg IV Sedation time: 19 minutes; The patient was continuously monitored during the procedure by the interventional radiology nurse under my direct supervision. CONTRAST:  None COMPLICATIONS: None immediate. PROCEDURE: Informed consent was obtained from the patient following an explanation of the procedure, risks, benefits and alternatives. A time out was performed prior to the initiation of the procedure. The patient was positioned supine on the CT table. Attention was initially paid towards the paracentesis. Initial ultrasound scanning demonstrates a moderate amount of ascites within the right right mid abdominal quadrant. The right mid abdomen was prepped and draped in the usual sterile fashion. 1% lidocaine with epinephrine was used for local anesthesia. Under direct ultrasound guidance, a 19 gauge, 7-cm, Yueh catheter was introduced. An ultrasound image was saved for documentation purposed. The paracentesis was performed. All aspirated fluid was sent to the laboratory for cytologic analysis. The catheter was removed and a dressing was applied. The patient tolerated the procedure well without immediate post procedural complication. Attention was now paid towards the CT-guided omental mass biopsy. Limited CT was performed for procedural planning demonstrating unchanged appearance of omental caking with dominant component centered within the ventral aspect of the lower abdomen / upper pelvis. The procedure was planned. The operative site was prepped and draped in the usual sterile fashion. Appropriate trajectory was confirmed with a 22 gauge spinal needle after the adjacent tissues were anesthetized with 1% Lidocaine with epinephrine. Under intermittent CT guidance, a 17 gauge coaxial needle was advanced into the peripheral  aspect of the omental caking Appropriate positioning was confirmed and  a core needle biopsy samples were obtained with an 18 gauge core needle biopsy device. The co-axial needle was removed and hemostasis was achieved with manual compression. A limited postprocedural CT was negative for hemorrhage or additional complication. A dressing was placed. The patient tolerated the procedure well without immediate postprocedural complication. IMPRESSION: 1. Technically successful CT guided core needle biopsy of omental caking. 2. Technically successful ultrasound-guided paracentesis yielding 1.1 L of serous fluid. Aspirated fluid was sent to the laboratory for cytologic analysis. Electronically Signed   By: Sandi Mariscal M.D.   On: 01/18/2016 13:11    ASSESSMENT: Peritoneal carcinomatosis.  PLAN:    1. Peritoneal carcinomatosis: Ct scan result reviewed independently and reported as above. Given her elevated ca-125, this is highly suspicious for underlying ovarian cancer. Will get a omental biopsy as well as paracentesis to obtain a diagnosis.  A referral was also given to Gyn-Onc for further evaluation. Once a diagnosis is obtained, patient will require a PET scan to complete the staging work up.  Ultimately, she likely will require neo-adjuvant chemotherapy, followed by surgical debulking and adjuvant chemotherapy.  Patient will return to clinic 4-5 days after her biopsy for further evaluation and to discuss the results. 2: Genetic testing: Patient is adopted, but given her age and diagnosis have recommended patient undergo genetic testing to which she agreed.  Approximately 45 minutes was spent in discussion of which greater than 50% was consultation.  Patient expressed understanding and was in agreement with this plan. She also understands that She can call clinic at any time with any questions, concerns, or complaints.   No matching staging information was found for the patient.  Lloyd Huger, MD   01/20/2016 7:55 AM

## 2016-01-20 NOTE — Progress Notes (Signed)
Patient here for consult no complaints today.

## 2016-01-21 NOTE — Progress Notes (Signed)
  Oncology Nurse Navigator Documentation  Navigator Location: [facility] (01/21/16 1300) Navigator Encounter Type: Clinic/[facility]C (01/21/16 1300)     Confirmed Diagnosis Date: 01/20/16 (01/21/16 1300)     Patient Visit Type:  (Gyn/Onc) (01/21/16 1300)                              Time Spent with Patient: 45 (01/21/16 1300)   Chaperoned pelvic exam. No preference for GI specialist. Referral placed for Dr Allen Norris for upper and lower luminal exam. Chest CT ordered. Will continue to follow. Per patient request will route Gyn note to PCP, at [facility], Dr Monia Sabal. Ebony Hail.

## 2016-01-25 ENCOUNTER — Inpatient Hospital Stay: Payer: BLUE CROSS/BLUE SHIELD | Admitting: Oncology

## 2016-01-25 ENCOUNTER — Ambulatory Visit: Admission: RE | Admit: 2016-01-25 | Payer: BLUE CROSS/BLUE SHIELD | Source: Ambulatory Visit

## 2016-01-25 ENCOUNTER — Ambulatory Visit
Admission: RE | Admit: 2016-01-25 | Discharge: 2016-01-25 | Disposition: A | Discharge status: Home / Self Care | Payer: BLUE CROSS/BLUE SHIELD | Source: Ambulatory Visit | Attending: Obstetrics and Gynecology | Admitting: Obstetrics and Gynecology

## 2016-01-25 DIAGNOSIS — C801 Malignant (primary) neoplasm, unspecified: Secondary | ICD-10-CM | POA: Diagnosis present

## 2016-01-25 DIAGNOSIS — C786 Secondary malignant neoplasm of retroperitoneum and peritoneum: Secondary | ICD-10-CM | POA: Insufficient documentation

## 2016-01-25 MED ORDER — IOPAMIDOL (ISOVUE-300) INJECTION 61%
75.0000 mL | Freq: Once | INTRAVENOUS | Status: AC | PRN
  Administered 2016-01-25: 75 mL via INTRAVENOUS

## 2016-01-26 ENCOUNTER — Telehealth: Payer: Self-pay

## 2016-01-26 ENCOUNTER — Other Ambulatory Visit: Payer: Self-pay

## 2016-01-26 ENCOUNTER — Encounter (INDEPENDENT_AMBULATORY_CARE_PROVIDER_SITE_OTHER): Payer: Self-pay

## 2016-01-26 ENCOUNTER — Inpatient Hospital Stay (HOSPITAL_BASED_OUTPATIENT_CLINIC_OR_DEPARTMENT_OTHER): Payer: BLUE CROSS/BLUE SHIELD | Admitting: Oncology

## 2016-01-26 VITALS — BP 113/76 | HR 98 | Temp 97.6°F | Wt 149.3 lb

## 2016-01-26 DIAGNOSIS — R188 Other ascites: Secondary | ICD-10-CM | POA: Diagnosis not present

## 2016-01-26 DIAGNOSIS — K219 Gastro-esophageal reflux disease without esophagitis: Secondary | ICD-10-CM

## 2016-01-26 DIAGNOSIS — C801 Malignant (primary) neoplasm, unspecified: Secondary | ICD-10-CM

## 2016-01-26 DIAGNOSIS — R140 Abdominal distension (gaseous): Secondary | ICD-10-CM

## 2016-01-26 DIAGNOSIS — C786 Secondary malignant neoplasm of retroperitoneum and peritoneum: Secondary | ICD-10-CM

## 2016-01-26 DIAGNOSIS — R971 Elevated cancer antigen 125 [CA 125]: Secondary | ICD-10-CM

## 2016-01-26 DIAGNOSIS — R6881 Early satiety: Secondary | ICD-10-CM

## 2016-01-26 DIAGNOSIS — G893 Neoplasm related pain (acute) (chronic): Secondary | ICD-10-CM

## 2016-01-26 DIAGNOSIS — Z87891 Personal history of nicotine dependence: Secondary | ICD-10-CM

## 2016-01-26 DIAGNOSIS — Z79899 Other long term (current) drug therapy: Secondary | ICD-10-CM

## 2016-01-26 MED ORDER — PEG 3350-KCL-NABCB-NACL-NASULF 236 G PO SOLR: 4000.0000 mL | Freq: Once | ORAL | 0 refills | Status: AC

## 2016-01-26 NOTE — Telephone Encounter (Signed)
Left vm for pt to return my call to schedule a colonoscopy and EGD asap.

## 2016-01-26 NOTE — Telephone Encounter (Signed)
Pt has been scheduled for a colonoscopy and EGD at [facility] on Friday, July 28th. Instructs have been emailed to pt and rx faxed to her pharmacy.

## 2016-01-27 ENCOUNTER — Other Ambulatory Visit: Payer: Self-pay | Admitting: *Deleted

## 2016-01-27 ENCOUNTER — Encounter: Payer: Self-pay | Admitting: *Deleted

## 2016-01-27 ENCOUNTER — Other Ambulatory Visit: Payer: Self-pay

## 2016-01-27 DIAGNOSIS — C786 Secondary malignant neoplasm of retroperitoneum and peritoneum: Secondary | ICD-10-CM

## 2016-01-27 DIAGNOSIS — C801 Malignant (primary) neoplasm, unspecified: Principal | ICD-10-CM

## 2016-01-27 MED ORDER — NA SULFATE-K SULFATE-MG SULF 17.5-3.13-1.6 GM/177ML PO SOLN: 1.0000 | ORAL | 0 refills | Status: DC

## 2016-01-28 MED ORDER — LIDOCAINE-PRILOCAINE 2.5-2.5 % EX CREA: TOPICAL_CREAM | CUTANEOUS | 3 refills | Status: DC

## 2016-01-28 MED ORDER — ONDANSETRON HCL 8 MG PO TABS: 8.0000 mg | ORAL_TABLET | Freq: Two times a day (BID) | ORAL | 1 refills | Status: DC | PRN

## 2016-01-28 MED ORDER — PROCHLORPERAZINE MALEATE 10 MG PO TABS: 10.0000 mg | ORAL_TABLET | Freq: Four times a day (QID) | ORAL | 1 refills | Status: DC | PRN

## 2016-01-28 NOTE — Discharge Instructions (Signed)

## 2016-01-28 NOTE — Progress Notes (Addendum)
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  INTERVAL HISTORY: Patient returns to clinic today discuss her pathology results, and imaging results, and treatment planning. He is to have mild abdominal pain and bloating, but otherwise feels well. She has no neurological complaints.  She denies any recent fevers or illnesses.  She has no chest pain or shortness of breath.  She denies and nausea, vomiting, constipation, or diarrhea. She has no melena or hematochezia. She has no urinary complains.  Patient offers no further specific complaints.  REVIEW OF SYSTEMS:   Review of Systems  Constitutional: Negative for fever, malaise/fatigue and weight loss.  Respiratory: Negative.  Negative for cough and shortness of breath.   Cardiovascular: Negative.  Negative for chest pain.  Gastrointestinal: Positive for abdominal pain. Negative for blood in stool, constipation, diarrhea, melena, nausea and vomiting.  Genitourinary: Negative.   Musculoskeletal: Negative.   Neurological: Negative.  Negative for weakness.  Psychiatric/Behavioral: The patient is not nervous/anxious.     As per HPI. Otherwise, a complete review of systems is negatve.  PAST MEDICAL HISTORY: Past Medical History:  Diagnosis Date  . Acid reflux   . Arthritis    left knee  . Wears contact lenses     PAST SURGICAL HISTORY: Past Surgical History:  Procedure Laterality Date  . ANTERIOR CRUCIATE LIGAMENT REPAIR Left   . CESAREAN SECTION      FAMILY HISTORY: Unknown.  Patient is adopted.     ADVANCED DIRECTIVES:    HEALTH MAINTENANCE: Social History  Substance Use Topics  . Smoking status: Former Research scientist (life sciences)  . Smokeless  tobacco: Never Used     Comment: Quit in her 48's  . Alcohol use 0.0 oz/week     Comment: 1 drink/mo     Colonoscopy:  PAP:  Bone density:  Lipid panel:  No Known Allergies  Current Outpatient Prescriptions  Medication Sig Dispense Refill  . acyclovir (ZOVIRAX) 800 MG tablet TAKE ONE TABLET BY MOUTH EVERY DAY    . Biotin 1 MG CAPS Take by mouth.    . Cholecalciferol (VITAMIN D3) 1000 units CAPS Take by mouth.    . escitalopram (LEXAPRO) 10 MG tablet TAKE 1 TABLET (10 MG TOTAL) BY MOUTH DAILY.    Marland Kitchen ibuprofen (ADVIL,MOTRIN) 200 MG tablet Take by mouth.    . pantoprazole (PROTONIX) 40 MG tablet Take by mouth.    . Na Sulfate-K Sulfate-Mg Sulf (SUPREP BOWEL PREP KIT) 17.5-3.13-1.6 GM/180ML SOLN Take 1 kit by mouth as directed. 1 Bottle 0   No current facility-administered medications for this visit.     OBJECTIVE: Vitals:   01/26/16 1046  BP: 113/76  Pulse: 98  Temp: 97.6 F (36.4 C)     Body mass index is 25.62 kg/m.    ECOG FS:1 - Symptomatic but completely ambulatory  General: Well-developed, well-nourished, no acute distress. Eyes: Pink conjunctiva, anicteric sclera. HEENT: Normocephalic, moist mucous membranes, clear oropharnyx. Lungs: Clear to auscultation bilaterally. Heart: Regular rate and rhythm. No rubs, murmurs, or gallops. Abdomen: Soft, nontender, mildly distended.  Musculoskeletal: No edema, cyanosis, or clubbing. Neuro: Alert, answering all questions appropriately. Cranial nerves grossly intact. Skin: No rashes or petechiae noted. Psych: Normal affect. Lymphatics: No cervical, calvicular, axillary or inguinal LAD.  LAB RESULTS:  Lab Results  Component Value Date   NA 136 01/11/2016   K 3.7 01/11/2016   CL 102 01/11/2016   CO2 26 01/11/2016   GLUCOSE 93 01/11/2016   BUN 10 01/11/2016   CREATININE 0.72 01/11/2016   CALCIUM 8.8 (L) 01/11/2016   PROT 7.2 01/11/2016   ALBUMIN 3.5 01/11/2016   AST 17 01/11/2016   ALT 11 (L) 01/11/2016   ALKPHOS  84 01/11/2016   BILITOT 0.6 01/11/2016   GFRNONAA >60 01/11/2016   GFRAA >60 01/11/2016    Lab Results  Component Value Date   WBC 6.3 01/18/2016   HGB 12.5 01/18/2016   HCT 38.7 01/18/2016   MCV 82.9 01/18/2016   PLT 417 (H) 01/18/2016   Lab Results  Component Value Date   CA125 191.9 (H) 01/11/2016     STUDIES: Ct Chest W Contrast  Result Date: 01/25/2016 CLINICAL DATA:  Recent diagnosis of peritoneal carcinomatosis. Staging chest CT. EXAM: CT CHEST WITH CONTRAST TECHNIQUE: Multidetector CT imaging of the chest was performed during intravenous contrast administration. CONTRAST:  74m ISOVUE-300 IOPAMIDOL (ISOVUE-300) INJECTION 61% COMPARISON:  Abdominal CT scan 01/11/2016 FINDINGS: Cardiovascular: The heart is normal in size. No pericardial effusion. The aorta is normal in caliber. No dissection. No significant atherosclerotic calcifications. No definite coronary artery calcifications. Mediastinum/Nodes: Small scattered mediastinal and hilar lymph nodes and small amount of fluid in the pericardial recesses. No mass or overt adenopathy. The esophagus is grossly normal. Lungs/Pleura: No acute pulmonary findings. No worrisome pulmonary lesions to suggest pulmonary metastatic disease. Linear areas of atelectasis or scarring. Subpleural dependent atelectasis. No bronchiectasis or interstitial lung disease. Upper Abdomen: Large volume ascites and omental and peritoneal surface disease. Musculoskeletal: No significant osseous findings. No breast masses, supraclavicular or axillary lymphadenopathy. The thyroid gland appears normal. IMPRESSION: 1. No CT findings for pulmonary metastatic disease. 2. No acute pulmonary findings. Electronically Signed   By: PMarijo SanesM.D.   On: 01/25/2016 15:15  Ct Abdomen Pelvis W Contrast  Result Date: 01/11/2016 CLINICAL DATA:  Generalized abdominal pain for 4 months. Distension. Bloating. EXAM: CT ABDOMEN AND PELVIS WITH CONTRAST TECHNIQUE: Multidetector CT  imaging of the abdomen and pelvis was performed using the standard protocol following bolus administration of intravenous contrast. CONTRAST:  1064mISOVUE-300 IOPAMIDOL (ISOVUE-300) INJECTION 61% COMPARISON:  None. FINDINGS: Lower chest:  Mild bibasilar atelectasis. Hepatobiliary: Liver appears normal. Gallbladder is unremarkable, difficult to definitively characterize due to surrounding fluid. Pancreas: Normal. Spleen: Within normal limits in size and appearance. Adrenals/Urinary Tract: Adrenal glands appear normal. Kidneys appear normal without mass, stone or hydronephrosis. No ureteral or bladder calculi identified. Stomach/Bowel: Bowel is normal in caliber. No bowel wall thickening or bowel wall mass identified. Appendix is difficult to characterize, mostly obscured by adjacent fluid. Vascular/Lymphatic: No pathologically enlarged lymph nodes. No evidence of abdominal aortic aneurysm. Reproductive: No mass or other significant abnormality. Other: Large volume free fluid throughout the abdomen and pelvis. Suspect nodular soft tissue thickening (omental caking) within the anterior mesentery, suggesting pseudomyxoma peritonei. No circumscribed fluid collection or abscess like collection. No free intraperitoneal air. Musculoskeletal: No acute or suspicious osseous finding. Mild degenerative change within the lumbar spine. Superficial soft tissues are unremarkable. IMPRESSION: 1. Large volume free fluid throughout the abdomen and pelvis. Suspect some nodular soft tissue thickening (omental caking) within the anterior mesentery. 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 No primary neoplastic mass identified. No adnexal mass seen. Appendix is mostly  obscured by free fluid but does appear somewhat prominent where seen which could indicate a possible neoplastic source for a mucinous neoplasm. 2. Remainder of the exam is unremarkable, as  detailed above. These results were called by telephone at the time of interpretation on 01/11/2016 at 7:35 pm to Dr. Larae Grooms , who verbally acknowledged these results. Electronically Signed   By: Franki Cabot M.D.   On: 01/11/2016 19:42   Ct Biopsy  Result Date: 01/18/2016 INDICATION: No known primary, now with omental caking and presumably malignant ascites. Please perform ultrasound-guided paracentesis and CT-guided omental mass biopsy for tissue diagnostic purposes. EXAM: 1. CT-GUIDED BIOPSY OF OMENTAL CAKING 2. IR PARACENTESIS COMPARISON:  CT of the abdomen and pelvis - 01/11/2016 MEDICATIONS: None ANESTHESIA/SEDATION: Fentanyl 50 mcg IV; Versed 2 mg IV Sedation time: 19 minutes; The patient was continuously monitored during the procedure by the interventional radiology nurse under my direct supervision. CONTRAST:  None COMPLICATIONS: None immediate. PROCEDURE: Informed consent was obtained from the patient following an explanation of the procedure, risks, benefits and alternatives. A time out was performed prior to the initiation of the procedure. The patient was positioned supine on the CT table. Attention was initially paid towards the paracentesis. Initial ultrasound scanning demonstrates a moderate amount of ascites within the right right mid abdominal quadrant. The right mid abdomen was prepped and draped in the usual sterile fashion. 1% lidocaine with epinephrine was used for local anesthesia. Under direct ultrasound guidance, a 19 gauge, 7-cm, Yueh catheter was introduced. An ultrasound image was saved for documentation purposed. The paracentesis was performed. All aspirated fluid was sent to the laboratory for cytologic analysis. The catheter was removed and a dressing was applied. The patient tolerated the procedure well without immediate post procedural complication. Attention was now paid towards the CT-guided omental mass biopsy. Limited CT was performed for procedural planning  demonstrating unchanged appearance of omental caking with dominant component centered within the ventral aspect of the lower abdomen / upper pelvis. The procedure was planned. The operative site was prepped and draped in the usual sterile fashion. Appropriate trajectory was confirmed with a 22 gauge spinal needle after the adjacent tissues were anesthetized with 1% Lidocaine with epinephrine. Under intermittent CT guidance, a 17 gauge coaxial needle was advanced into the peripheral aspect of the omental caking Appropriate positioning was confirmed and a core needle biopsy samples were obtained with an 18 gauge core needle biopsy device. The co-axial needle was removed and hemostasis was achieved with manual compression. A limited postprocedural CT was negative for hemorrhage or additional complication. A dressing was placed. The patient tolerated the procedure well without immediate postprocedural complication. IMPRESSION: 1. Technically successful CT guided core needle biopsy of omental caking. 2. Technically successful ultrasound-guided paracentesis yielding 1.1 L of serous fluid. Aspirated fluid was sent to the laboratory for cytologic analysis. Electronically Signed   By: Sandi Mariscal M.D.   On: 01/18/2016 13:11   Ir Paracentesis  Result Date: 01/18/2016 INDICATION: No known primary, now with omental caking and presumably malignant ascites. Please perform ultrasound-guided paracentesis and CT-guided omental mass biopsy for tissue diagnostic purposes. EXAM: 1. CT-GUIDED BIOPSY OF OMENTAL CAKING 2. IR PARACENTESIS COMPARISON:  CT of the abdomen and pelvis - 01/11/2016 MEDICATIONS: None ANESTHESIA/SEDATION: Fentanyl 50 mcg IV; Versed 2 mg IV Sedation time: 19 minutes; The patient was continuously monitored during the procedure by the interventional radiology nurse under my direct supervision. CONTRAST:  None COMPLICATIONS: None immediate. PROCEDURE: Informed consent was obtained from the  patient following an  explanation of the procedure, risks, benefits and alternatives. A time out was performed prior to the initiation of the procedure. The patient was positioned supine on the CT table. Attention was initially paid towards the paracentesis. Initial ultrasound scanning demonstrates a moderate amount of ascites within the right right mid abdominal quadrant. The right mid abdomen was prepped and draped in the usual sterile fashion. 1% lidocaine with epinephrine was used for local anesthesia. Under direct ultrasound guidance, a 19 gauge, 7-cm, Yueh catheter was introduced. An ultrasound image was saved for documentation purposed. The paracentesis was performed. All aspirated fluid was sent to the laboratory for cytologic analysis. The catheter was removed and a dressing was applied. The patient tolerated the procedure well without immediate post procedural complication. Attention was now paid towards the CT-guided omental mass biopsy. Limited CT was performed for procedural planning demonstrating unchanged appearance of omental caking with dominant component centered within the ventral aspect of the lower abdomen / upper pelvis. The procedure was planned. The operative site was prepped and draped in the usual sterile fashion. Appropriate trajectory was confirmed with a 22 gauge spinal needle after the adjacent tissues were anesthetized with 1% Lidocaine with epinephrine. Under intermittent CT guidance, a 17 gauge coaxial needle was advanced into the peripheral aspect of the omental caking Appropriate positioning was confirmed and a core needle biopsy samples were obtained with an 18 gauge core needle biopsy device. The co-axial needle was removed and hemostasis was achieved with manual compression. A limited postprocedural CT was negative for hemorrhage or additional complication. A dressing was placed. The patient tolerated the procedure well without immediate postprocedural complication. IMPRESSION: 1. Technically  successful CT guided core needle biopsy of omental caking. 2. Technically successful ultrasound-guided paracentesis yielding 1.1 L of serous fluid. Aspirated fluid was sent to the laboratory for cytologic analysis. Electronically Signed   By: Sandi Mariscal M.D.   On: 01/18/2016 13:11    ASSESSMENT: Peritoneal carcinomatosis, metastatic adenocarcinoma of likely lower GI primary.  PLAN:    1. Peritoneal carcinomatosis: Pathology results reviewed, immunohistochemistry suggested lower GI primary. CT results reviewed independently and reported as above with metastatic disease throughout the abdomen. Prior to initiating chemotherapy with FOLFOX, patient will have colonoscopy and EGD to confirm a primary lesion. Of note, patient had a normal colonoscopy on October 16, 2014 in [place], [place]. She also require port placement. Patient has also stated she is getting a second opinion at [facility]. Return to clinic on February 17, 2016 for further evaluation and initiation of cycle 1 of 12 of FOLFOX. Plan to reimage after 6 cycles.  2: Genetic testing: Patient is adopted, but given her age and diagnosis have recommended patient undergo genetic testing to which she agreed.  Approximately 30 minutes was spent in discussion of which greater than 50% was consultation.  Patient expressed understanding and was in agreement with this plan. She also understands that She can call clinic at any time with any questions, concerns, or complaints.   No matching staging information was found for the patient.  Lloyd Huger, MD   01/28/2016 11:10 PM

## 2016-01-29 ENCOUNTER — Ambulatory Visit: Payer: BLUE CROSS/BLUE SHIELD | Admitting: Anesthesiology

## 2016-01-29 ENCOUNTER — Ambulatory Visit
Admission: RE | Admit: 2016-01-29 | Discharge: 2016-01-29 | Disposition: A | Discharge status: Home / Self Care | Payer: BLUE CROSS/BLUE SHIELD | Source: Ambulatory Visit | Attending: Gastroenterology | Admitting: Gastroenterology

## 2016-01-29 ENCOUNTER — Encounter: Payer: Self-pay | Admitting: *Deleted

## 2016-01-29 ENCOUNTER — Encounter
Admission: RE | Disposition: A | Discharge status: Home / Self Care | Payer: Self-pay | Source: Ambulatory Visit | Attending: Gastroenterology

## 2016-01-29 DIAGNOSIS — Z87891 Personal history of nicotine dependence: Secondary | ICD-10-CM | POA: Insufficient documentation

## 2016-01-29 DIAGNOSIS — K6389 Other specified diseases of intestine: Secondary | ICD-10-CM | POA: Insufficient documentation

## 2016-01-29 DIAGNOSIS — K219 Gastro-esophageal reflux disease without esophagitis: Secondary | ICD-10-CM | POA: Insufficient documentation

## 2016-01-29 DIAGNOSIS — R933 Abnormal findings on diagnostic imaging of other parts of digestive tract: Secondary | ICD-10-CM | POA: Insufficient documentation

## 2016-01-29 DIAGNOSIS — C786 Secondary malignant neoplasm of retroperitoneum and peritoneum: Secondary | ICD-10-CM | POA: Insufficient documentation

## 2016-01-29 DIAGNOSIS — M1990 Unspecified osteoarthritis, unspecified site: Secondary | ICD-10-CM | POA: Insufficient documentation

## 2016-01-29 DIAGNOSIS — R198 Other specified symptoms and signs involving the digestive system and abdomen: Secondary | ICD-10-CM

## 2016-01-29 HISTORY — DX: Unspecified osteoarthritis, unspecified site: M19.90

## 2016-01-29 HISTORY — DX: Presence of spectacles and contact lenses: Z97.3

## 2016-01-29 HISTORY — PX: ESOPHAGOGASTRODUODENOSCOPY (EGD) WITH PROPOFOL: SHX5813

## 2016-01-29 HISTORY — PX: COLONOSCOPY WITH PROPOFOL: SHX5780

## 2016-01-29 SURGERY — COLONOSCOPY WITH PROPOFOL
Anesthesia: Monitor Anesthesia Care | Site: Throat | Wound class: Contaminated

## 2016-01-29 MED ORDER — OXYCODONE HCL 5 MG/5ML PO SOLN: 5.0000 mg | Freq: Once | ORAL | Status: DC | PRN

## 2016-01-29 MED ORDER — STERILE WATER FOR IRRIGATION IR SOLN
Status: DC | PRN
  Administered 2016-01-29: 11:00:00

## 2016-01-29 MED ORDER — GLYCOPYRROLATE 0.2 MG/ML IJ SOLN
INTRAMUSCULAR | Status: DC | PRN
  Administered 2016-01-29: 0.2 mg via INTRAVENOUS

## 2016-01-29 MED ORDER — OXYCODONE HCL 5 MG PO TABS: 5.0000 mg | ORAL_TABLET | Freq: Once | ORAL | Status: DC | PRN

## 2016-01-29 MED ORDER — PROPOFOL 10 MG/ML IV BOLUS
INTRAVENOUS | Status: DC | PRN
  Administered 2016-01-29 (×3): 50 mg via INTRAVENOUS
  Administered 2016-01-29 (×2): 20 mg via INTRAVENOUS
  Administered 2016-01-29: 50 mg via INTRAVENOUS
  Administered 2016-01-29 (×2): 20 mg via INTRAVENOUS

## 2016-01-29 MED ORDER — LACTATED RINGERS IV SOLN
INTRAVENOUS | Status: DC
  Administered 2016-01-29: 10:00:00 via INTRAVENOUS

## 2016-01-29 MED ORDER — LIDOCAINE HCL (CARDIAC) 20 MG/ML IV SOLN
INTRAVENOUS | Status: DC | PRN
  Administered 2016-01-29: 50 mg via INTRAVENOUS

## 2016-01-29 SURGICAL SUPPLY — 35 items
BALLN DILATOR 10-12 8 (BALLOONS)
BALLN DILATOR 12-15 8 (BALLOONS)
BALLN DILATOR 15-18 8 (BALLOONS)
BALLN DILATOR CRE 0-12 8 (BALLOONS)
BALLN DILATOR ESOPH 8 10 CRE (MISCELLANEOUS) IMPLANT
BALLOON DILATOR 12-15 8 (BALLOONS) IMPLANT
BALLOON DILATOR 15-18 8 (BALLOONS) IMPLANT
BALLOON DILATOR CRE 0-12 8 (BALLOONS) IMPLANT
BLOCK BITE 60FR ADLT L/F GRN (MISCELLANEOUS) ×3 IMPLANT
CANISTER SUCT 1200ML W/VALVE (MISCELLANEOUS) ×3 IMPLANT
CLIP HMST 235XBRD CATH ROT (MISCELLANEOUS) ×2 IMPLANT
CLIP RESOLUTION 360 11X235 (MISCELLANEOUS) ×1
FCP ESCP3.2XJMB 240X2.8X (MISCELLANEOUS)
FORCEPS BIOP RAD 4 LRG CAP 4 (CUTTING FORCEPS) ×3 IMPLANT
FORCEPS BIOP RJ4 240 W/NDL (MISCELLANEOUS)
FORCEPS ESCP3.2XJMB 240X2.8X (MISCELLANEOUS) IMPLANT
GOWN CVR UNV OPN BCK APRN NK (MISCELLANEOUS) ×4 IMPLANT
GOWN ISOL THUMB LOOP REG UNIV (MISCELLANEOUS) ×2
INJECTOR VARIJECT VIN23 (MISCELLANEOUS) IMPLANT
KIT DEFENDO VALVE AND CONN (KITS) IMPLANT
KIT ENDO PROCEDURE OLY (KITS) ×3 IMPLANT
MARKER SPOT ENDO TATTOO 5ML (MISCELLANEOUS) IMPLANT
PAD GROUND ADULT SPLIT (MISCELLANEOUS) IMPLANT
PROBE APC STR FIRE (PROBE) IMPLANT
RETRIEVER NET PLAT FOOD (MISCELLANEOUS) IMPLANT
RETRIEVER NET ROTH 2.5X230 LF (MISCELLANEOUS) ×3 IMPLANT
SNARE SHORT THROW 13M SML OVAL (MISCELLANEOUS) IMPLANT
SNARE SHORT THROW 30M LRG OVAL (MISCELLANEOUS) IMPLANT
SNARE SNG USE RND 15MM (INSTRUMENTS) IMPLANT
SPOT EX ENDOSCOPIC TATTOO (MISCELLANEOUS)
SYR INFLATION 60ML (SYRINGE) IMPLANT
TRAP ETRAP POLY (MISCELLANEOUS) IMPLANT
VARIJECT INJECTOR VIN23 (MISCELLANEOUS)
WATER STERILE IRR 250ML POUR (IV SOLUTION) ×3 IMPLANT
WIRE CRE 18-20MM 8CM F G (MISCELLANEOUS) IMPLANT

## 2016-01-29 NOTE — Transfer of Care (Signed)
Immediate Anesthesia Transfer of Care Note  Patient: Valerie Horton  Procedure(s) Performed: Procedure(s): COLONOSCOPY WITH PROPOFOL (N/A) ESOPHAGOGASTRODUODENOSCOPY (EGD) WITH PROPOFOL (N/A)  Patient Location: PACU  Anesthesia Type: MAC  Level of Consciousness: awake, alert  and patient cooperative  Airway and Oxygen Therapy: Patient Spontanous Breathing and Patient connected to supplemental oxygen  Post-op Assessment: Post-op Vital signs reviewed, Patient's Cardiovascular Status Stable, Respiratory Function Stable, Patent Airway and No signs of Nausea or vomiting  Post-op Vital Signs: Reviewed and stable  Complications: No apparent anesthesia complications

## 2016-01-29 NOTE — Anesthesia Preprocedure Evaluation (Addendum)
Anesthesia Evaluation  Patient identified by MRN, date of birth, ID band  Reviewed: NPO status   History of Anesthesia Complications Negative for: history of anesthetic complications  Airway Mallampati: II  TM Distance: >3 FB Neck ROM: full    Dental no notable dental hx.    Pulmonary neg pulmonary ROS, former smoker,    Pulmonary exam normal        Cardiovascular Exercise Tolerance: Good negative cardio ROS Normal cardiovascular exam     Neuro/Psych Anxiety negative neurological ROS     GI/Hepatic Neg liver ROS, GERD  Controlled,  Endo/Other  negative endocrine ROS  Renal/GU negative Renal ROS  negative genitourinary   Musculoskeletal  (+) Arthritis ,   Abdominal   Peds  Hematology  Peritoneal carcinomatosis, metastatic adenocarcinoma  Drained 1.5 L from belly 12/2015   Anesthesia Other Findings Pt is adopted.  Reproductive/Obstetrics                            Anesthesia Physical Anesthesia Plan  ASA: II  Anesthesia Plan: MAC   Post-op Pain Management:    Induction:   Airway Management Planned:   Additional Equipment:   Intra-op Plan:   Post-operative Plan:   Informed Consent: I have reviewed the patients History and Physical, chart, labs and discussed the procedure including the risks, benefits and alternatives for the proposed anesthesia with the patient or authorized representative who has indicated his/her understanding and acceptance.     Plan Discussed with: CRNA  Anesthesia Plan Comments:        Anesthesia Quick Evaluation

## 2016-01-29 NOTE — Anesthesia Procedure Notes (Signed)
Procedure Name: MAC Performed by: Netasha Wehrli Pre-anesthesia Checklist: Patient identified, Emergency Drugs available, Suction available, Timeout performed and Patient being monitored Patient Re-evaluated:Patient Re-evaluated prior to inductionOxygen Delivery Method: Nasal cannula Placement Confirmation: positive ETCO2     

## 2016-01-29 NOTE — Op Note (Signed)
[facility] Patient Name: Valerie Horton Procedure Date: 01/29/2016 10:20 AM MRN: [id number] Account #: [id number] Date of Birth: 05/04/1961 Admit Type: Outpatient Age: 55 Room: [facility] OR ROOM 01 Gender: Female Note Status: Finalized Procedure:            Upper GI endoscopy Indications:          Abnormal CT of the GI tract Providers:            Lucilla Lame MD, MD Referring MD:         Kathlene November. Grayland Ormond, MD (Referring MD) Medicines:            Propofol per Anesthesia Complications:        No immediate complications. Procedure:            Pre-Anesthesia Assessment:                       - Prior to the procedure, a History and Physical was                        performed, and patient medications and allergies were                        reviewed. The patient's tolerance of previous                        anesthesia was also reviewed. The risks and benefits of                        the procedure and the sedation options and risks were                        discussed with the patient. All questions were                        answered, and informed consent was obtained. Prior                        Anticoagulants: The patient has taken no previous                        anticoagulant or antiplatelet agents. ASA Grade                        Assessment: II - A patient with mild systemic disease.                        After reviewing the risks and benefits, the patient was                        deemed in satisfactory condition to undergo the                        procedure.                       After obtaining informed consent, the endoscope was                        passed under direct vision. Throughout the procedure,  the patient's blood pressure, pulse, and oxygen                        saturations were monitored continuously. The Olympus                        GIF H180J colonscope [id number]) was introduced              through the mouth, and advanced to the second part of                        duodenum. The upper GI endoscopy was accomplished                        without difficulty. The patient tolerated the procedure                        well. Findings:      The esophagus was normal.      The stomach was normal.      The examined duodenum was normal. Impression:           - Normal esophagus.                       - Normal stomach.                       - Normal examined duodenum.                       - No specimens collected. Recommendation:       - Perform a colonoscopy today. Procedure Code(s):    --- Professional ---                       [phone], Esophagogastroduodenoscopy, flexible, transoral;                        diagnostic, including collection of specimen(s) by                        brushing or washing, when performed (separate procedure) Diagnosis Code(s):    --- Professional ---                       R93.3, Abnormal findings on diagnostic imaging of other                        parts of digestive tract CPT copyright 2016 [employer]. All rights reserved. The codes documented in this report are preliminary and upon coder review may  be revised to meet current compliance requirements. Lucilla Lame MD, MD 01/29/2016 10:36:00 AM This report has been signed electronically. Number of Addenda: 0 Note Initiated On: 01/29/2016 10:20 AM      [facility]

## 2016-01-29 NOTE — H&P (Signed)
Lucilla Lame, MD [address] [place], [place] [address] Phone: [phone] Fax : [phone]  Primary Care Physician:  PROVIDER NOT IN SYSTEM Primary Gastroenterologist:  Dr. Allen Norris  Pre-Procedure History & Physical: HPI:  Valerie Horton is a 55 y.o. female is here for an endoscopy and colonoscopy.   Past Medical History:  Diagnosis Date  . Acid reflux   . Arthritis    left knee  . Wears contact lenses     Past Surgical History:  Procedure Laterality Date  . ANTERIOR CRUCIATE LIGAMENT REPAIR Left   . CESAREAN SECTION      Prior to Admission medications   Medication Sig Start Date End Date Taking? Authorizing Provider  Cholecalciferol (VITAMIN D3) 1000 units CAPS Take by mouth.   Yes Historical Provider, MD  escitalopram (LEXAPRO) 10 MG tablet TAKE 1 TABLET (10 MG TOTAL) BY MOUTH DAILY. 12/11/15  Yes Historical Provider, MD  ibuprofen (ADVIL,MOTRIN) 200 MG tablet Take by mouth.   Yes Historical Provider, MD  Na Sulfate-K Sulfate-Mg Sulf (SUPREP BOWEL PREP KIT) 17.5-3.13-1.6 GM/180ML SOLN Take 1 kit by mouth as directed. 01/27/16  Yes Lucilla Lame, MD  ondansetron (ZOFRAN) 8 MG tablet Take 1 tablet (8 mg total) by mouth 2 (two) times daily as needed for refractory nausea / vomiting. 01/28/16  Yes Lloyd Huger, MD  pantoprazole (PROTONIX) 40 MG tablet Take by mouth. 01/06/16 01/05/17 Yes Historical Provider, MD  prochlorperazine (COMPAZINE) 10 MG tablet Take 1 tablet (10 mg total) by mouth every 6 (six) hours as needed (Nausea or vomiting). 01/28/16  Yes Lloyd Huger, MD  acyclovir (ZOVIRAX) 800 MG tablet TAKE ONE TABLET BY MOUTH EVERY DAY 07/03/15   Historical Provider, MD  Biotin 1 MG CAPS Take by mouth.    Historical Provider, MD  lidocaine-prilocaine (EMLA) cream Apply to affected area once Patient not taking: Reported on 01/29/2016 01/28/16   Lloyd Huger, MD    Allergies as of 01/26/2016  . (No Known Allergies)    History reviewed. No pertinent  family history.  Social History   Social History  . Marital status: Married    Spouse name: N/A  . Number of children: 3  . Years of education: N/A   Occupational History  . Homemaker    Social History Main Topics  . Smoking status: Former Research scientist (life sciences)  . Smokeless tobacco: Never Used     Comment: Quit in her 44's  . Alcohol use 0.0 oz/week     Comment: 1 drink/mo  . Drug use: Unknown  . Sexual activity: Not on file   Other Topics Concern  . Not on file   Social History Narrative  . No narrative on file    Review of Systems: See HPI, otherwise negative ROS  Physical Exam: BP (!) 110/57   Pulse 87   Temp 98.7 F (37.1 C)   Resp 16   Ht '5\' 4"'  (1.626 m)   Wt 145 lb (65.8 kg)   SpO2 95%   BMI 24.89 kg/m  General:   Alert,  pleasant and cooperative in NAD Head:  Normocephalic and atraumatic. Neck:  Supple; no masses or thyromegaly. Lungs:  Clear throughout to auscultation.    Heart:  Regular rate and rhythm. Abdomen:  Soft, nontender and nondistended. Normal bowel sounds, without guarding, and without rebound.   Neurologic:  Alert and  oriented x4;  grossly normal neurologically.  Impression/Plan: Valerie Horton is here for an endoscopy and colonoscopy to be performed for  peritoneal carcinomatosis  Risks, benefits, limitations, and alternatives regarding  endoscopy and colonoscopy have been reviewed with the patient.  Questions have been answered.  All parties agreeable.   Lucilla Lame, MD  01/29/2016, 9:49 AM

## 2016-01-29 NOTE — Op Note (Signed)
[facility] Gastroenterology Patient Name: Valerie Horton Procedure Date: 01/29/2016 10:36 AM MRN: [id number] Account #: [id number] Date of Birth: 1960/08/07 Admit Type: Outpatient Age: 55 Room: [facility] OR ROOM 01 Gender: Female Note Status: Finalized Procedure:            Colonoscopy Indications:          Abnormal CT of the GI tract Providers:            Lucilla Lame MD, MD Referring MD:         Kathlene November. Grayland Ormond, MD (Referring MD) Medicines:            Propofol per Anesthesia Complications:        No immediate complications. Procedure:            Pre-Anesthesia Assessment:                       - Prior to the procedure, a History and Physical was                        performed, and patient medications and allergies were                        reviewed. The patient's tolerance of previous                        anesthesia was also reviewed. The risks and benefits of                        the procedure and the sedation options and risks were                        discussed with the patient. All questions were                        answered, and informed consent was obtained. Prior                        Anticoagulants: The patient has taken no previous                        anticoagulant or antiplatelet agents. ASA Grade                        Assessment: II - A patient with mild systemic disease.                        After reviewing the risks and benefits, the patient was                        deemed in satisfactory condition to undergo the                        procedure.                       After obtaining informed consent, the colonoscope was                        passed under direct vision. Throughout the procedure,  the patient's blood pressure, pulse, and oxygen                        saturations were monitored continuously. The was                        introduced through the anus and advanced to the the        cecum, identified by appendiceal orifice and ileocecal                        valve. The colonoscopy was performed without                        difficulty. The patient tolerated the procedure well.                        The quality of the bowel preparation was excellent. Findings:      The perianal and digital rectal examinations were normal.      A localized area of moderately erythematous mucosa was found appendiceal       orifice. This was biopsied with a cold forceps for histology. To repair       the defect, the tissue edges were approximated and two hemostatic clips       were successfully placed (MR conditional). Closure of the defect was       successful. There was no bleeding at the end of the procedure. Impression:           - Erythematous mucosa at the appendiceal orifice.                        Biopsied. Clips (MR conditional) were placed. Recommendation:       - Await pathology results. Procedure Code(s):    --- Professional ---                       [phone], Colonoscopy, flexible; with biopsy, single or                        multiple Diagnosis Code(s):    --- Professional ---                       R93.3, Abnormal findings on diagnostic imaging of other                        parts of digestive tract                       K63.89, Other specified diseases of intestine CPT copyright 2016 American Medical Association. All rights reserved. The codes documented in this report are preliminary and upon coder review may  be revised to meet current compliance requirements. Lucilla Lame MD, MD 01/29/2016 10:54:47 AM This report has been signed electronically. Number of Addenda: 0 Note Initiated On: 01/29/2016 10:36 AM Scope Withdrawal Time: 0 hours 10 minutes 54 seconds  Total Procedure Duration: 0 hours 15 minutes 13 seconds       [facility]

## 2016-01-29 NOTE — Anesthesia Postprocedure Evaluation (Signed)
Anesthesia Post Note  Patient: Valerie Horton  Procedure(s) Performed: Procedure(s) (LRB): COLONOSCOPY WITH PROPOFOL (N/A) ESOPHAGOGASTRODUODENOSCOPY (EGD) WITH PROPOFOL (N/A)  Patient location during evaluation: PACU Anesthesia Type: MAC Level of consciousness: awake and alert Pain management: pain level controlled Vital Signs Assessment: post-procedure vital signs reviewed and stable Respiratory status: spontaneous breathing, nonlabored ventilation, respiratory function stable and patient connected to nasal cannula oxygen Cardiovascular status: stable and blood pressure returned to baseline Anesthetic complications: no    Laquasha Groome

## 2016-02-01 ENCOUNTER — Other Ambulatory Visit: Payer: Self-pay | Admitting: Vascular Surgery

## 2016-02-01 ENCOUNTER — Ambulatory Visit: Payer: BLUE CROSS/BLUE SHIELD | Admitting: Oncology

## 2016-02-01 ENCOUNTER — Encounter: Payer: Self-pay | Admitting: Gastroenterology

## 2016-02-01 NOTE — Progress Notes (Signed)
  Oncology Nurse Navigator Documentation  Navigator Location: [facility] (02/01/16 1600) Navigator Encounter Type: Letter/Fax/Email (02/01/16 1600)                                          Time Spent with Patient: 30 (02/01/16 1600)   Per spouse request, I have contacted Dr Fanny Skates and inquired if there are any further records I can provide before her 8/14 consult at [facility]. He has also requested that prognosis and life expectancy not be mentioned at this consult.

## 2016-02-02 LAB — SURGICAL PATHOLOGY

## 2016-02-03 ENCOUNTER — Encounter: Payer: Self-pay | Admitting: Gastroenterology

## 2016-02-09 ENCOUNTER — Other Ambulatory Visit: Payer: Self-pay | Admitting: Vascular Surgery

## 2016-02-09 ENCOUNTER — Other Ambulatory Visit
Admission: RE | Admit: 2016-02-09 | Payer: BLUE CROSS/BLUE SHIELD | Source: Ambulatory Visit | Admitting: Vascular Surgery

## 2016-02-09 NOTE — Patient Instructions (Signed)

## 2016-02-10 ENCOUNTER — Encounter: Payer: Self-pay | Admitting: *Deleted

## 2016-02-10 ENCOUNTER — Encounter
Admission: RE | Disposition: A | Discharge status: Home / Self Care | Payer: Self-pay | Source: Ambulatory Visit | Attending: Vascular Surgery

## 2016-02-10 ENCOUNTER — Ambulatory Visit
Admission: RE | Admit: 2016-02-10 | Discharge: 2016-02-10 | Disposition: A | Discharge status: Home / Self Care | Payer: BLUE CROSS/BLUE SHIELD | Source: Ambulatory Visit | Attending: Vascular Surgery | Admitting: Vascular Surgery

## 2016-02-10 DIAGNOSIS — Z87891 Personal history of nicotine dependence: Secondary | ICD-10-CM | POA: Insufficient documentation

## 2016-02-10 DIAGNOSIS — K219 Gastro-esophageal reflux disease without esophagitis: Secondary | ICD-10-CM | POA: Diagnosis not present

## 2016-02-10 DIAGNOSIS — M1712 Unilateral primary osteoarthritis, left knee: Secondary | ICD-10-CM | POA: Insufficient documentation

## 2016-02-10 DIAGNOSIS — C801 Malignant (primary) neoplasm, unspecified: Secondary | ICD-10-CM | POA: Diagnosis not present

## 2016-02-10 DIAGNOSIS — C786 Secondary malignant neoplasm of retroperitoneum and peritoneum: Secondary | ICD-10-CM

## 2016-02-10 HISTORY — PX: PERIPHERAL VASCULAR CATHETERIZATION: SHX172C

## 2016-02-10 SURGERY — PORTA CATH INSERTION
Anesthesia: Moderate Sedation

## 2016-02-10 MED ORDER — SODIUM CHLORIDE 0.9 % IV SOLN: INTRAVENOUS | Status: DC

## 2016-02-10 MED ORDER — HEPARIN (PORCINE) IN NACL 2-0.9 UNIT/ML-% IJ SOLN
INTRAMUSCULAR | Status: AC
  Filled 2016-02-10: qty 500

## 2016-02-10 MED ORDER — SODIUM CHLORIDE 0.9 % IR SOLN: Freq: Once | Status: DC

## 2016-02-10 MED ORDER — MIDAZOLAM HCL 2 MG/2ML IJ SOLN
INTRAMUSCULAR | Status: DC | PRN
  Administered 2016-02-10: 1 mg via INTRAVENOUS
  Administered 2016-02-10: 2 mg via INTRAVENOUS

## 2016-02-10 MED ORDER — ONDANSETRON HCL 4 MG/2ML IJ SOLN: 4.0000 mg | Freq: Four times a day (QID) | INTRAMUSCULAR | Status: DC | PRN

## 2016-02-10 MED ORDER — HYDROMORPHONE HCL 1 MG/ML IJ SOLN: 1.0000 mg | Freq: Once | INTRAMUSCULAR | Status: DC

## 2016-02-10 MED ORDER — DEXTROSE 5 % IV SOLN: 1.5000 g | INTRAVENOUS | Status: DC

## 2016-02-10 MED ORDER — FENTANYL CITRATE (PF) 100 MCG/2ML IJ SOLN
INTRAMUSCULAR | Status: DC | PRN
  Administered 2016-02-10: 50 ug via INTRAVENOUS
  Administered 2016-02-10 (×2): 25 ug via INTRAVENOUS

## 2016-02-10 MED ORDER — FENTANYL CITRATE (PF) 100 MCG/2ML IJ SOLN
INTRAMUSCULAR | Status: AC
  Filled 2016-02-10: qty 2

## 2016-02-10 MED ORDER — LIDOCAINE-EPINEPHRINE (PF) 1 %-1:200000 IJ SOLN
INTRAMUSCULAR | Status: AC
  Filled 2016-02-10: qty 30

## 2016-02-10 MED ORDER — MIDAZOLAM HCL 5 MG/5ML IJ SOLN
INTRAMUSCULAR | Status: AC
  Filled 2016-02-10: qty 5

## 2016-02-10 MED ORDER — DEXTROSE 5 % IV SOLN
1.5000 g | INTRAVENOUS | Status: AC
  Administered 2016-02-10: 1.5 g via INTRAVENOUS

## 2016-02-10 SURGICAL SUPPLY — 10 items
BAG DECANTER STRL (MISCELLANEOUS) ×2 IMPLANT
KIT PORT POWER 8FR ISP CVUE (Catheter) ×2 IMPLANT
PACK ANGIOGRAPHY (CUSTOM PROCEDURE TRAY) ×2 IMPLANT
PAD GROUND ADULT SPLIT (MISCELLANEOUS) ×2 IMPLANT
PENCIL ELECTRO HAND CTR (MISCELLANEOUS) ×2 IMPLANT
PREP CHG 10.5 TEAL (MISCELLANEOUS) ×2 IMPLANT
SUT MNCRL AB 4-0 PS2 18 (SUTURE) ×2 IMPLANT
SUT PROLENE 0 CT 1 30 (SUTURE) ×2 IMPLANT
SUTURE VIC 3-0 (SUTURE) ×2 IMPLANT
TOWEL OR 17X26 4PK STRL BLUE (TOWEL DISPOSABLE) ×2 IMPLANT

## 2016-02-10 NOTE — Op Note (Signed)
      [facility]       Operative Note  Date: 02/10/2016  Preoperative diagnosis:  1. Peritoneal carcinomatosis  Postoperative diagnosis:  Same as above  Procedures: #1. Ultrasound guidance for vascular access to Valerie right internal jugular vein. #2. Fluoroscopic guidance for placement of catheter. #3. Placement of CT compatible Port-A-Cath, right internal jugular vein.  Surgeon: Leotis Pain, MD.   Anesthesia: Local with moderate conscious sedation for approximately 25  minutes using 3 mg of Versed and 100 mcg of Fentanyl  Fluoroscopy time: less than 1 minute  Contrast used: 0  Estimated blood loss: 15 cc  Indication for Valerie procedure:  Valerie Horton is a 55 y.o.female with peritoneal carcinomatosis.  Valerie Horton needs a Port-A-Cath for durable venous access, chemotherapy, lab draws, and CT scans. We are asked to place this. Risks and benefits were discussed and informed consent was obtained.  Description of procedure: Valerie Horton was brought to Valerie vascular and interventional radiology suite.  Moderate conscious sedation was administered throughout Valerie procedure during a face to face encounter with Valerie Horton with my supervision of the RN administering medicines and monitoring Valerie Horton's vital signs, pulse oximetry, telemetry and mental status throughout from Valerie start of Valerie procedure until Valerie Horton was taken to Valerie recovery room. Valerie right neck chest and shoulder were sterilely prepped and draped, and a sterile surgical field was created. Ultrasound was used to help visualize a patent right internal jugular vein. This was then accessed under direct ultrasound guidance without difficulty with Valerie Seldinger needle and a permanent image was recorded. A J-wire was placed. After skin nick and dilatation, Valerie peel-away sheath was then placed over Valerie wire. I then anesthetized an area under Valerie clavicle approximately 1-2 fingerbreadths. A transverse incision was created  and an inferior pocket was created with electrocautery and blunt dissection. Valerie port was then brought onto Valerie field, placed into Valerie pocket and secured to Valerie chest wall with 2 Prolene sutures. Valerie catheter was connected to Valerie port and tunneled from Valerie subclavicular incision to Valerie access site. Fluoroscopic guidance was then used to cut Valerie catheter to an appropriate length. Valerie catheter was then placed through Valerie peel-away sheath and Valerie peel-away sheath was removed. Valerie catheter tip was parked in excellent location under fluorocoscopic guidance in Valerie cavoatrial junction. Valerie pocket was then irrigated with antibiotic impregnated saline and Valerie wound was closed with a running 3-0 Vicryl and a 4-0 Monocryl. Valerie access incision was closed with a single 4-0 Monocryl. Valerie Huber needle was used to withdraw blood and flush Valerie port with heparinized saline. Dermabond was then placed as a dressing. Valerie Horton tolerated Valerie procedure well and was taken to Valerie recovery room in stable condition.   DEW,JASON 02/10/2016 9:53 AM

## 2016-02-10 NOTE — H&P (Signed)
  [facility] History & Physical Update  The patient was interviewed and re-examined.  The patient's previous History and Physical has been reviewed and is unchanged.  There is no change in the plan of care. We plan to proceed with the scheduled procedure.  DEW,JASON, MD  02/10/2016, 8:09 AM

## 2016-02-11 ENCOUNTER — Inpatient Hospital Stay: Payer: BLUE CROSS/BLUE SHIELD | Attending: Oncology

## 2016-02-11 ENCOUNTER — Telehealth: Payer: Self-pay

## 2016-02-11 ENCOUNTER — Other Ambulatory Visit: Payer: Self-pay | Admitting: *Deleted

## 2016-02-11 ENCOUNTER — Ambulatory Visit: Admission: RE | Admit: 2016-02-11 | Payer: BLUE CROSS/BLUE SHIELD | Source: Ambulatory Visit

## 2016-02-11 ENCOUNTER — Inpatient Hospital Stay: Payer: BLUE CROSS/BLUE SHIELD

## 2016-02-11 ENCOUNTER — Encounter: Payer: Self-pay | Admitting: Vascular Surgery

## 2016-02-11 DIAGNOSIS — Z5111 Encounter for antineoplastic chemotherapy: Secondary | ICD-10-CM | POA: Diagnosis not present

## 2016-02-11 DIAGNOSIS — C801 Malignant (primary) neoplasm, unspecified: Secondary | ICD-10-CM | POA: Insufficient documentation

## 2016-02-11 DIAGNOSIS — G629 Polyneuropathy, unspecified: Secondary | ICD-10-CM | POA: Insufficient documentation

## 2016-02-11 DIAGNOSIS — R109 Unspecified abdominal pain: Secondary | ICD-10-CM | POA: Diagnosis not present

## 2016-02-11 DIAGNOSIS — R140 Abdominal distension (gaseous): Secondary | ICD-10-CM | POA: Insufficient documentation

## 2016-02-11 DIAGNOSIS — M1990 Unspecified osteoarthritis, unspecified site: Secondary | ICD-10-CM | POA: Insufficient documentation

## 2016-02-11 DIAGNOSIS — Z79899 Other long term (current) drug therapy: Secondary | ICD-10-CM | POA: Diagnosis not present

## 2016-02-11 DIAGNOSIS — Z87891 Personal history of nicotine dependence: Secondary | ICD-10-CM | POA: Insufficient documentation

## 2016-02-11 DIAGNOSIS — C786 Secondary malignant neoplasm of retroperitoneum and peritoneum: Secondary | ICD-10-CM

## 2016-02-11 DIAGNOSIS — K219 Gastro-esophageal reflux disease without esophagitis: Secondary | ICD-10-CM | POA: Insufficient documentation

## 2016-02-11 DIAGNOSIS — D649 Anemia, unspecified: Secondary | ICD-10-CM | POA: Diagnosis not present

## 2016-02-11 LAB — CBC WITH DIFFERENTIAL/PLATELET
Basophils Absolute: 0 10*3/uL (ref 0–0.1)
Basophils Relative: 1 %
Eosinophils Absolute: 0 10*3/uL (ref 0–0.7)
Eosinophils Relative: 1 %
HCT: 34 % — ABNORMAL LOW (ref 35.0–47.0)
Hemoglobin: 11.3 g/dL — ABNORMAL LOW (ref 12.0–16.0)
Lymphocytes Relative: 11 %
Lymphs Abs: 0.9 10*3/uL — ABNORMAL LOW (ref 1.0–3.6)
MCH: 26.1 pg (ref 26.0–34.0)
MCHC: 33.2 g/dL (ref 32.0–36.0)
MCV: 78.5 fL — ABNORMAL LOW (ref 80.0–100.0)
Monocytes Absolute: 0.6 10*3/uL (ref 0.2–0.9)
Monocytes Relative: 7 %
Neutro Abs: 6.6 10*3/uL — ABNORMAL HIGH (ref 1.4–6.5)
Neutrophils Relative %: 80 %
Platelets: 465 10*3/uL — ABNORMAL HIGH (ref 150–440)
RBC: 4.33 MIL/uL (ref 3.80–5.20)
RDW: 15.4 % — ABNORMAL HIGH (ref 11.5–14.5)
WBC: 8.1 10*3/uL (ref 3.6–11.0)

## 2016-02-11 LAB — PROTIME-INR
INR: 1.09
Prothrombin Time: 14.1 seconds (ref 11.4–15.2)

## 2016-02-11 NOTE — Progress Notes (Signed)
  Oncology Nurse Navigator Documentation  Navigator Location: [facility] (02/11/16 1100) Navigator Encounter Type: Other (02/11/16 1100)               Barriers/Navigation Needs: Coordination of Care (02/11/16 1100)   Interventions: Coordination of Care (02/11/16 1100)                      Time Spent with Patient: 30 (02/11/16 1100)   Met with Valerie Horton this am during chemo class. She is feeling distended and tender again in her abdomen. Asking for therapeutic paracentesis. Hayley RN arranged with Dr Mike Gip and paracentesis will be performed today. Scheduling will notify of instructions after chemo class. I have instructed to go to the cancer center lab after class for labs prior to paracentesis.

## 2016-02-11 NOTE — Telephone Encounter (Signed)
  Oncology Nurse Navigator Documentation  Navigator Location: [facility] (02/11/16 1600) Navigator Encounter Type: Telephone (02/11/16 1600) Telephone: Lahoma Crocker Call;Appt Confirmation/Clarification (02/11/16 1600)             Barriers/Navigation Needs: Coordination of Care (02/11/16 1600)   Interventions: Coordination of Care (02/11/16 1600)   Coordination of Care: Appts (02/11/16 1600)                  Time Spent with Patient: 15 (02/11/16 1600)   Called and spoke with Valerie Horton. Notified that scheduling at cancer center is in the process of getting paracentesis rescheduled due to miscommunication. We will notify her when it has been arranged. She verbalized understanding.

## 2016-02-12 ENCOUNTER — Ambulatory Visit
Admission: RE | Admit: 2016-02-12 | Discharge: 2016-02-12 | Disposition: A | Discharge status: Home / Self Care | Payer: BLUE CROSS/BLUE SHIELD | Source: Ambulatory Visit | Attending: Hematology and Oncology | Admitting: Hematology and Oncology

## 2016-02-12 DIAGNOSIS — C786 Secondary malignant neoplasm of retroperitoneum and peritoneum: Secondary | ICD-10-CM | POA: Insufficient documentation

## 2016-02-12 DIAGNOSIS — R188 Other ascites: Secondary | ICD-10-CM | POA: Diagnosis not present

## 2016-02-12 DIAGNOSIS — C801 Malignant (primary) neoplasm, unspecified: Secondary | ICD-10-CM | POA: Insufficient documentation

## 2016-02-12 NOTE — Procedures (Signed)
Successful US guided paracentesis yielding 600 cc of serous ascitic fluid.  EBL: None  No immediate post procedural complications.   Ronny Bacon, MD Pager #: [phone]

## 2016-02-15 NOTE — Progress Notes (Signed)
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  INTERVAL HISTORY: Patient returns to clinic today for further evaluation and initiation of cycle 1 of 12 of FOLFOX. She continues to have mild abdominal pain and bloating, but otherwise feels well. She has no neurological complaints.  She denies any recent fevers or illnesses.  She has no chest pain or shortness of breath.  She denies and nausea, vomiting, constipation, or diarrhea. She has no melena or hematochezia. She has no urinary complains.  Patient offers no further specific complaints.  REVIEW OF SYSTEMS:   Review of Systems  Constitutional: Negative for fever, malaise/fatigue and weight loss.  Respiratory: Negative.  Negative for cough and shortness of breath.   Cardiovascular: Negative.  Negative for chest pain.  Gastrointestinal: Positive for abdominal pain. Negative for blood in stool, constipation, diarrhea, melena, nausea and vomiting.  Genitourinary: Negative.   Musculoskeletal: Negative.   Neurological: Negative.  Negative for weakness.  Psychiatric/Behavioral: The patient is not nervous/anxious.     As per HPI. Otherwise, a complete review of systems is negatve.  PAST MEDICAL HISTORY: Past Medical History:  Diagnosis Date  . Acid reflux   . Arthritis    left knee  . Wears contact lenses     PAST SURGICAL HISTORY: Past Surgical History:  Procedure Laterality Date  . ANTERIOR CRUCIATE LIGAMENT REPAIR Left   . CESAREAN SECTION    . COLONOSCOPY WITH PROPOFOL N/A 01/29/2016   Procedure: COLONOSCOPY WITH PROPOFOL;  Surgeon: Lucilla Lame, MD;  Location: [facility];  Service: Endoscopy;  Laterality: N/A;  .  ESOPHAGOGASTRODUODENOSCOPY (EGD) WITH PROPOFOL N/A 01/29/2016   Procedure: ESOPHAGOGASTRODUODENOSCOPY (EGD) WITH PROPOFOL;  Surgeon: Lucilla Lame, MD;  Location: [facility];  Service: Endoscopy;  Laterality: N/A;  . PERIPHERAL VASCULAR CATHETERIZATION N/A 02/10/2016   Procedure: Glori Luis Cath Insertion;  Surgeon: Algernon Huxley, MD;  Location: [facility];  Service: Cardiovascular;  Laterality: N/A;    FAMILY HISTORY: Unknown.  Patient is adopted.     ADVANCED DIRECTIVES:    HEALTH MAINTENANCE: Social History  Substance Use Topics  . Smoking status: Former Research scientist (life sciences)  . Smokeless tobacco: Never Used     Comment: Quit in her 40's  . Alcohol use 0.0 oz/week     Comment: 1 drink/mo     Colonoscopy:  PAP:  Bone density:  Lipid panel:  No Known Allergies  Current Outpatient Prescriptions  Medication Sig Dispense Refill  . acyclovir (ZOVIRAX) 800 MG tablet TAKE ONE TABLET BY MOUTH EVERY DAY    . escitalopram (LEXAPRO) 10 MG tablet TAKE 1 TABLET (10 MG TOTAL) BY MOUTH DAILY.    Marland Kitchen ibuprofen (ADVIL,MOTRIN) 200 MG tablet Take 200 mg by mouth every 6 (six) hours as needed for moderate pain.     Marland Kitchen lidocaine-prilocaine (EMLA) cream Apply to affected area once 30 g 3  . Na Sulfate-K Sulfate-Mg Sulf (SUPREP BOWEL PREP KIT) 17.5-3.13-1.6 GM/180ML SOLN Take 1 kit by mouth as directed. 1 Bottle 0  . ondansetron (ZOFRAN) 8 MG tablet Take 1 tablet (8 mg total) by mouth 2 (two) times daily as needed for refractory nausea / vomiting. 30 tablet 1  . pantoprazole (PROTONIX) 40 MG tablet Take 40 mg by mouth daily.     Marland Kitchen  prochlorperazine (COMPAZINE) 10 MG tablet Take 1 tablet (10 mg total) by mouth every 6 (six) hours as needed (Nausea or vomiting). 30 tablet 1   No current facility-administered medications for this visit.     OBJECTIVE: Vitals:   02/16/16 0941  BP: 115/70  Pulse: 97  Resp: 18  Temp: 98.5 F (36.9 C)     Body mass index is 25.22 kg/m.    ECOG FS:1 - Symptomatic but  completely ambulatory  General: Well-developed, well-nourished, no acute distress. Eyes: Pink conjunctiva, anicteric sclera. HEENT: Normocephalic, moist mucous membranes, clear oropharnyx. Lungs: Clear to auscultation bilaterally. Heart: Regular rate and rhythm. No rubs, murmurs, or gallops. Abdomen: Soft, nontender, mildly distended.  Musculoskeletal: No edema, cyanosis, or clubbing. Neuro: Alert, answering all questions appropriately. Cranial nerves grossly intact. Skin: No rashes or petechiae noted. Psych: Normal affect. Lymphatics: No cervical, calvicular, axillary or inguinal LAD.   LAB RESULTS:  Lab Results  Component Value Date   NA 132 (L) 02/16/2016   K 3.7 02/16/2016   CL 102 02/16/2016   CO2 23 02/16/2016   GLUCOSE 105 (H) 02/16/2016   BUN 10 02/16/2016   CREATININE 0.62 02/16/2016   CALCIUM 8.3 (L) 02/16/2016   PROT 7.0 02/16/2016   ALBUMIN 3.3 (L) 02/16/2016   AST 15 02/16/2016   ALT 10 (L) 02/16/2016   ALKPHOS 80 02/16/2016   BILITOT 0.4 02/16/2016   GFRNONAA >60 02/16/2016   GFRAA >60 02/16/2016    Lab Results  Component Value Date   WBC 7.3 02/16/2016   NEUTROABS 5.7 02/16/2016   HGB 11.4 (L) 02/16/2016   HCT 34.0 (L) 02/16/2016   MCV 77.3 (L) 02/16/2016   PLT 375 02/16/2016   Lab Results  Component Value Date   CA125 191.9 (H) 01/11/2016     STUDIES: Ct Chest W Contrast  Result Date: 01/25/2016 CLINICAL DATA:  Recent diagnosis of peritoneal carcinomatosis. Staging chest CT. EXAM: CT CHEST WITH CONTRAST TECHNIQUE: Multidetector CT imaging of the chest was performed during intravenous contrast administration. CONTRAST:  10m ISOVUE-300 IOPAMIDOL (ISOVUE-300) INJECTION 61% COMPARISON:  Abdominal CT scan 01/11/2016 FINDINGS: Cardiovascular: The heart is normal in size. No pericardial effusion. The aorta is normal in caliber. No dissection. No significant atherosclerotic calcifications. No definite coronary artery calcifications. Mediastinum/Nodes:  Small scattered mediastinal and hilar lymph nodes and small amount of fluid in the pericardial recesses. No mass or overt adenopathy. The esophagus is grossly normal. Lungs/Pleura: No acute pulmonary findings. No worrisome pulmonary lesions to suggest pulmonary metastatic disease. Linear areas of atelectasis or scarring. Subpleural dependent atelectasis. No bronchiectasis or interstitial lung disease. Upper Abdomen: Large volume ascites and omental and peritoneal surface disease. Musculoskeletal: No significant osseous findings. No breast masses, supraclavicular or axillary lymphadenopathy. The thyroid gland appears normal. IMPRESSION: 1. No CT findings for pulmonary metastatic disease. 2. No acute pulmonary findings. Electronically Signed   By: PMarijo SanesM.D.   On: 01/25/2016 15:15  [place]acentesis  Result Date: 02/12/2016 INDICATION: History of peritoneal carcinomatosis, now with recurrent symptomatic ascites. Please perform ultrasound-guided paracentesis for therapeutic purposes. EXAM: ULTRASOUND-GUIDED PARACENTESIS COMPARISON:  CT the abdomen pelvis - 01/11/2016; ultrasound-guided paracentesis - 01/18/2016 MEDICATIONS: None. COMPLICATIONS: None immediate. TECHNIQUE: Informed written consent was obtained from the patient after a discussion of the risks, benefits and alternatives to treatment. A timeout was performed prior to the initiation of the procedure. Initial ultrasound scanning demonstrates a moderate amount of ascites within the abdomen. Note is made of rather significant peritoneal and omental caking, findings  compatible with recent abdominal CT. The right mid lateral abdomen was prepped and draped in the usual sterile fashion. 1% lidocaine with epinephrine was used for local anesthesia. Initially, an 8 [place] Safe-T-Centesis catheter was introduced however only a trace amount of abdominal ascites could be aspirated secondary to apposition of the catheter against the peritoneal / omental caking.  As such, under direct ultrasound guidance, a 19 gauge, 7-cm, Yueh catheter was introduced. An ultrasound image was saved for documentation purposed. The paracentesis was performed. The catheter was removed and a dressing was applied. The patient tolerated the procedure well without immediate post procedural complication. FINDINGS: A total of approximately 600 cc of serous fluid was removed. Postprocedural scanning demonstrated a small amount of residual intra-abdominal ascites. IMPRESSION: Successful ultrasound-guided paracentesis yielding 600 cc liters of peritoneal fluid. Note, there is incomplete evacuation of the abdominal ascites secondary to significant peritoneal and omental caking which resulted in premature occlusion of the paracentesis catheter. Electronically Signed   By: Sandi Mariscal M.D.   On: 02/12/2016 10:30   Ct Biopsy  Result Date: 01/18/2016 INDICATION: No known primary, now with omental caking and presumably malignant ascites. Please perform ultrasound-guided paracentesis and CT-guided omental mass biopsy for tissue diagnostic purposes. EXAM: 1. CT-GUIDED BIOPSY OF OMENTAL CAKING 2. IR PARACENTESIS COMPARISON:  CT of the abdomen and pelvis - 01/11/2016 MEDICATIONS: None ANESTHESIA/SEDATION: Fentanyl 50 mcg IV; Versed 2 mg IV Sedation time: 19 minutes; The patient was continuously monitored during the procedure by the interventional radiology nurse under my direct supervision. CONTRAST:  None COMPLICATIONS: None immediate. PROCEDURE: Informed consent was obtained from the patient following an explanation of the procedure, risks, benefits and alternatives. A time out was performed prior to the initiation of the procedure. The patient was positioned supine on the CT table. Attention was initially paid towards the paracentesis. Initial ultrasound scanning demonstrates a moderate amount of ascites within the right right mid abdominal quadrant. The right mid abdomen was prepped and draped in the  usual sterile fashion. 1% lidocaine with epinephrine was used for local anesthesia. Under direct ultrasound guidance, a 19 gauge, 7-cm, Yueh catheter was introduced. An ultrasound image was saved for documentation purposed. The paracentesis was performed. All aspirated fluid was sent to the laboratory for cytologic analysis. The catheter was removed and a dressing was applied. The patient tolerated the procedure well without immediate post procedural complication. Attention was now paid towards the CT-guided omental mass biopsy. Limited CT was performed for procedural planning demonstrating unchanged appearance of omental caking with dominant component centered within the ventral aspect of the lower abdomen / upper pelvis. The procedure was planned. The operative site was prepped and draped in the usual sterile fashion. Appropriate trajectory was confirmed with a 22 gauge spinal needle after the adjacent tissues were anesthetized with 1% Lidocaine with epinephrine. Under intermittent CT guidance, a 17 gauge coaxial needle was advanced into the peripheral aspect of the omental caking Appropriate positioning was confirmed and a core needle biopsy samples were obtained with an 18 gauge core needle biopsy device. The co-axial needle was removed and hemostasis was achieved with manual compression. A limited postprocedural CT was negative for hemorrhage or additional complication. A dressing was placed. The patient tolerated the procedure well without immediate postprocedural complication. IMPRESSION: 1. Technically successful CT guided core needle biopsy of omental caking. 2. Technically successful ultrasound-guided paracentesis yielding 1.1 L of serous fluid. Aspirated fluid was sent to the laboratory for cytologic analysis. Electronically Signed   By: Jenny Reichmann  Watts M.D.   On: 01/18/2016 13:11   Ir Paracentesis  Result Date: 01/18/2016 INDICATION: No known primary, now with omental caking and presumably malignant  ascites. Please perform ultrasound-guided paracentesis and CT-guided omental mass biopsy for tissue diagnostic purposes. EXAM: 1. CT-GUIDED BIOPSY OF OMENTAL CAKING 2. IR PARACENTESIS COMPARISON:  CT of the abdomen and pelvis - 01/11/2016 MEDICATIONS: None ANESTHESIA/SEDATION: Fentanyl 50 mcg IV; Versed 2 mg IV Sedation time: 19 minutes; The patient was continuously monitored during the procedure by the interventional radiology nurse under my direct supervision. CONTRAST:  None COMPLICATIONS: None immediate. PROCEDURE: Informed consent was obtained from the patient following an explanation of the procedure, risks, benefits and alternatives. A time out was performed prior to the initiation of the procedure. The patient was positioned supine on the CT table. Attention was initially paid towards the paracentesis. Initial ultrasound scanning demonstrates a moderate amount of ascites within the right right mid abdominal quadrant. The right mid abdomen was prepped and draped in the usual sterile fashion. 1% lidocaine with epinephrine was used for local anesthesia. Under direct ultrasound guidance, a 19 gauge, 7-cm, Yueh catheter was introduced. An ultrasound image was saved for documentation purposed. The paracentesis was performed. All aspirated fluid was sent to the laboratory for cytologic analysis. The catheter was removed and a dressing was applied. The patient tolerated the procedure well without immediate post procedural complication. Attention was now paid towards the CT-guided omental mass biopsy. Limited CT was performed for procedural planning demonstrating unchanged appearance of omental caking with dominant component centered within the ventral aspect of the lower abdomen / upper pelvis. The procedure was planned. The operative site was prepped and draped in the usual sterile fashion. Appropriate trajectory was confirmed with a 22 gauge spinal needle after the adjacent tissues were anesthetized with 1%  Lidocaine with epinephrine. Under intermittent CT guidance, a 17 gauge coaxial needle was advanced into the peripheral aspect of the omental caking Appropriate positioning was confirmed and a core needle biopsy samples were obtained with an 18 gauge core needle biopsy device. The co-axial needle was removed and hemostasis was achieved with manual compression. A limited postprocedural CT was negative for hemorrhage or additional complication. A dressing was placed. The patient tolerated the procedure well without immediate postprocedural complication. IMPRESSION: 1. Technically successful CT guided core needle biopsy of omental caking. 2. Technically successful ultrasound-guided paracentesis yielding 1.1 L of serous fluid. Aspirated fluid was sent to the laboratory for cytologic analysis. Electronically Signed   By: Sandi Mariscal M.D.   On: 01/18/2016 13:11    ASSESSMENT: Peritoneal carcinomatosis, metastatic adenocarcinoma of likely lower GI primary.  PLAN:    1. Peritoneal carcinomatosis: Pathology results reviewed, immunohistochemistry suggested lower GI primary. CT results reviewed independently and reported as above with metastatic disease throughout the abdomen. Of note, patient had a normal colonoscopy on October 16, 2014 in [place], [place]. Patient also had second opinion at [facility] that concurs with the plan. Proceed with cycle 1 of 12 of FOLFOX. Return to clinic in 2 days for pump removal, 1 week for laboratory work, and then in 2 weeks for consideration of cycle 2. Plan to reimage after 6 cycles.  2: Genetic testing: Patient is adopted, but given her age and diagnosis have recommended patient undergo genetic testing to which she agreed. 3. Anemia: Mild, monitor.  Patient expressed understanding and was in agreement with this plan. She also understands that She can call clinic at any time with any questions, concerns, or  complaints.   No matching staging information was found for  the patient.  Lloyd Huger, MD   02/17/2016 10:24 AM

## 2016-02-16 ENCOUNTER — Inpatient Hospital Stay: Payer: BLUE CROSS/BLUE SHIELD

## 2016-02-16 ENCOUNTER — Inpatient Hospital Stay (HOSPITAL_BASED_OUTPATIENT_CLINIC_OR_DEPARTMENT_OTHER): Payer: BLUE CROSS/BLUE SHIELD | Admitting: Oncology

## 2016-02-16 ENCOUNTER — Other Ambulatory Visit: Payer: BLUE CROSS/BLUE SHIELD

## 2016-02-16 VITALS — BP 115/70 | HR 97 | Temp 98.5°F | Resp 18 | Wt 146.9 lb

## 2016-02-16 DIAGNOSIS — Z87891 Personal history of nicotine dependence: Secondary | ICD-10-CM

## 2016-02-16 DIAGNOSIS — C801 Malignant (primary) neoplasm, unspecified: Secondary | ICD-10-CM

## 2016-02-16 DIAGNOSIS — R140 Abdominal distension (gaseous): Secondary | ICD-10-CM

## 2016-02-16 DIAGNOSIS — M1990 Unspecified osteoarthritis, unspecified site: Secondary | ICD-10-CM

## 2016-02-16 DIAGNOSIS — K219 Gastro-esophageal reflux disease without esophagitis: Secondary | ICD-10-CM

## 2016-02-16 DIAGNOSIS — D649 Anemia, unspecified: Secondary | ICD-10-CM | POA: Diagnosis not present

## 2016-02-16 DIAGNOSIS — C786 Secondary malignant neoplasm of retroperitoneum and peritoneum: Secondary | ICD-10-CM

## 2016-02-16 DIAGNOSIS — R109 Unspecified abdominal pain: Secondary | ICD-10-CM

## 2016-02-16 DIAGNOSIS — Z79899 Other long term (current) drug therapy: Secondary | ICD-10-CM

## 2016-02-16 LAB — CBC WITH DIFFERENTIAL/PLATELET
BASOS ABS: 0 10*3/uL (ref 0–0.1)
Basophils Relative: 1 %
EOS ABS: 0.1 10*3/uL (ref 0–0.7)
EOS PCT: 1 %
HEMATOCRIT: 34 % — AB (ref 35.0–47.0)
Hemoglobin: 11.4 g/dL — ABNORMAL LOW (ref 12.0–16.0)
Lymphocytes Relative: 13 %
Lymphs Abs: 1 10*3/uL (ref 1.0–3.6)
MCH: 25.9 pg — ABNORMAL LOW (ref 26.0–34.0)
MCHC: 33.5 g/dL (ref 32.0–36.0)
MCV: 77.3 fL — AB (ref 80.0–100.0)
Monocytes Absolute: 0.6 10*3/uL (ref 0.2–0.9)
Monocytes Relative: 8 %
Neutro Abs: 5.7 10*3/uL (ref 1.4–6.5)
Neutrophils Relative %: 77 %
Platelets: 375 10*3/uL (ref 150–440)
RBC: 4.4 MIL/uL (ref 3.80–5.20)
RDW: 15.9 % — AB (ref 11.5–14.5)
WBC: 7.3 10*3/uL (ref 3.6–11.0)

## 2016-02-16 LAB — COMPREHENSIVE METABOLIC PANEL
ALBUMIN: 3.3 g/dL — AB (ref 3.5–5.0)
ALK PHOS: 80 U/L (ref 38–126)
ALT: 10 U/L — AB (ref 14–54)
AST: 15 U/L (ref 15–41)
Anion gap: 7 (ref 5–15)
BILIRUBIN TOTAL: 0.4 mg/dL (ref 0.3–1.2)
BUN: 10 mg/dL (ref 6–20)
CO2: 23 mmol/L (ref 22–32)
CREATININE: 0.62 mg/dL (ref 0.44–1.00)
Calcium: 8.3 mg/dL — ABNORMAL LOW (ref 8.9–10.3)
Chloride: 102 mmol/L (ref 101–111)
GFR calc Af Amer: >60 mL/min (ref >60)
GLUCOSE: 105 mg/dL — AB (ref 65–99)
POTASSIUM: 3.7 mmol/L (ref 3.5–5.1)
Sodium: 132 mmol/L — ABNORMAL LOW (ref 135–145)
TOTAL PROTEIN: 7 g/dL (ref 6.5–8.1)

## 2016-02-16 MED ORDER — SODIUM CHLORIDE 0.9% FLUSH
10.0000 mL | Freq: Once | INTRAVENOUS | Status: AC
  Administered 2016-02-16: 10 mL via INTRAVENOUS
  Filled 2016-02-16: qty 10

## 2016-02-16 MED ORDER — DEXTROSE 5 % IV SOLN
Freq: Once | INTRAVENOUS | Status: AC
  Administered 2016-02-16: 10:00:00 via INTRAVENOUS
  Filled 2016-02-16: qty 1000

## 2016-02-16 MED ORDER — SODIUM CHLORIDE 0.9 % IV SOLN
10.0000 mg | Freq: Once | INTRAVENOUS | Status: AC
  Administered 2016-02-16: 10 mg via INTRAVENOUS
  Filled 2016-02-16: qty 1

## 2016-02-16 MED ORDER — HEPARIN SOD (PORK) LOCK FLUSH 100 UNIT/ML IV SOLN: 500.0000 [IU] | Freq: Once | INTRAVENOUS | Status: DC

## 2016-02-16 MED ORDER — FLUOROURACIL CHEMO INJECTION 5 GM/100ML
2400.0000 mg/m2 | INTRAVENOUS | Status: DC
  Administered 2016-02-16: 4200 mg via INTRAVENOUS
  Filled 2016-02-16: qty 84

## 2016-02-16 MED ORDER — OXALIPLATIN CHEMO INJECTION 100 MG/20ML
85.0000 mg/m2 | Freq: Once | INTRAVENOUS | Status: AC
  Administered 2016-02-16: 150 mg via INTRAVENOUS
  Filled 2016-02-16: qty 10

## 2016-02-16 MED ORDER — PALONOSETRON HCL INJECTION 0.25 MG/5ML
0.2500 mg | Freq: Once | INTRAVENOUS | Status: AC
  Administered 2016-02-16: 0.25 mg via INTRAVENOUS
  Filled 2016-02-16: qty 5

## 2016-02-16 MED ORDER — LEUCOVORIN CALCIUM INJECTION 350 MG
400.0000 mg/m2 | Freq: Once | INTRAVENOUS | Status: AC
  Administered 2016-02-16: 700 mg via INTRAVENOUS
  Filled 2016-02-16: qty 25

## 2016-02-16 MED ORDER — FLUOROURACIL CHEMO INJECTION 2.5 GM/50ML
400.0000 mg/m2 | Freq: Once | INTRAVENOUS | Status: AC
  Administered 2016-02-16: 700 mg via INTRAVENOUS
  Filled 2016-02-16: qty 14

## 2016-02-16 NOTE — Progress Notes (Signed)
  Oncology Nurse Navigator Documentation  Navigator Location: [facility] (02/16/16 1200) Navigator Encounter Type: Treatment (02/16/16 1200)         Treatment Initiated Date: 02/16/16 (02/16/16 1200)   Treatment Phase: First Chemo Tx (02/16/16 1200) Barriers/Navigation Needs: No barriers at this time (02/16/16 1200)                          Time Spent with Patient: 15 (02/16/16 1200)   Doing well during first chemo treatment. Reports no navigational needs at present.

## 2016-02-16 NOTE — Progress Notes (Signed)
States has abdominal discomfort and bloating. Had paracentesis last week.

## 2016-02-18 ENCOUNTER — Inpatient Hospital Stay: Payer: BLUE CROSS/BLUE SHIELD

## 2016-02-18 DIAGNOSIS — C786 Secondary malignant neoplasm of retroperitoneum and peritoneum: Secondary | ICD-10-CM | POA: Diagnosis not present

## 2016-02-18 DIAGNOSIS — C801 Malignant (primary) neoplasm, unspecified: Secondary | ICD-10-CM

## 2016-02-18 MED ORDER — HEPARIN SOD (PORK) LOCK FLUSH 100 UNIT/ML IV SOLN
500.0000 [IU] | Freq: Once | INTRAVENOUS | Status: AC
  Administered 2016-02-18: 500 [IU] via INTRAVENOUS

## 2016-02-18 MED ORDER — HEPARIN SOD (PORK) LOCK FLUSH 100 UNIT/ML IV SOLN
INTRAVENOUS | Status: AC
  Filled 2016-02-18: qty 5

## 2016-02-18 MED ORDER — SODIUM CHLORIDE 0.9 % IJ SOLN
10.0000 mL | Freq: Once | INTRAMUSCULAR | Status: AC
  Administered 2016-02-18: 10 mL via INTRAVENOUS
  Filled 2016-02-18: qty 10

## 2016-02-23 ENCOUNTER — Telehealth: Payer: Self-pay | Admitting: *Deleted

## 2016-02-23 ENCOUNTER — Inpatient Hospital Stay: Payer: BLUE CROSS/BLUE SHIELD

## 2016-02-23 DIAGNOSIS — C786 Secondary malignant neoplasm of retroperitoneum and peritoneum: Secondary | ICD-10-CM | POA: Diagnosis not present

## 2016-02-23 DIAGNOSIS — C801 Malignant (primary) neoplasm, unspecified: Principal | ICD-10-CM

## 2016-02-23 LAB — CBC WITH DIFFERENTIAL/PLATELET
BASOS ABS: 0 10*3/uL (ref 0–0.1)
BASOS PCT: 1 %
Eosinophils Absolute: 0.1 10*3/uL (ref 0–0.7)
Eosinophils Relative: 2 %
HEMATOCRIT: 33.6 % — AB (ref 35.0–47.0)
Hemoglobin: 11.3 g/dL — ABNORMAL LOW (ref 12.0–16.0)
LYMPHS PCT: 21 %
Lymphs Abs: 1.1 10*3/uL (ref 1.0–3.6)
MCH: 26 pg (ref 26.0–34.0)
MCHC: 33.6 g/dL (ref 32.0–36.0)
MCV: 77.4 fL — AB (ref 80.0–100.0)
Monocytes Absolute: 0.3 10*3/uL (ref 0.2–0.9)
Monocytes Relative: 6 %
NEUTROS ABS: 3.8 10*3/uL (ref 1.4–6.5)
Neutrophils Relative %: 70 %
PLATELETS: 261 10*3/uL (ref 150–440)
RBC: 4.34 MIL/uL (ref 3.80–5.20)
RDW: 15.8 % — ABNORMAL HIGH (ref 11.5–14.5)
WBC: 5.3 10*3/uL (ref 3.6–11.0)

## 2016-02-23 LAB — COMPREHENSIVE METABOLIC PANEL
ALBUMIN: 3.2 g/dL — AB (ref 3.5–5.0)
ALT: 9 U/L — AB (ref 14–54)
AST: 15 U/L (ref 15–41)
Alkaline Phosphatase: 74 U/L (ref 38–126)
Anion gap: 7 (ref 5–15)
BILIRUBIN TOTAL: 0.3 mg/dL (ref 0.3–1.2)
BUN: 11 mg/dL (ref 6–20)
CHLORIDE: 102 mmol/L (ref 101–111)
CO2: 25 mmol/L (ref 22–32)
CREATININE: 0.61 mg/dL (ref 0.44–1.00)
Calcium: 8.3 mg/dL — ABNORMAL LOW (ref 8.9–10.3)
GFR calc Af Amer: >60 mL/min (ref >60)
GLUCOSE: 110 mg/dL — AB (ref 65–99)
POTASSIUM: 3.6 mmol/L (ref 3.5–5.1)
Sodium: 134 mmol/L — ABNORMAL LOW (ref 135–145)
Total Protein: 7 g/dL (ref 6.5–8.1)

## 2016-02-23 MED ORDER — MAGIC MOUTHWASH: 5.0000 mL | Freq: Three times a day (TID) | ORAL | 0 refills | Status: DC | PRN

## 2016-02-23 NOTE — Telephone Encounter (Signed)
Pt having mouth sores and abd cramping. Per Dr. Grayland Ormond, will call in magic mouthwash for mouth sores and cramping is caused from last chemotherapy treatment.

## 2016-02-29 NOTE — Progress Notes (Signed)
[place]  [phone] [phone]  ID: Valerie Horton OB: Aug 27, 1960  MR#: [id number]  [id number]  Patient Care Team: Provider Not In System as PCP - General Clent Jacks, RN as Registered Nurse  CHIEF COMPLAINT: Peritoneal carcinomatosis, metastatic adenocarcinoma of likely lower GI primary.  INTERVAL HISTORY: Patient returns to clinic today for further evaluation and initiation of cycle 2 of 12 of FOLFOX. She tolerated her first treatment well without significant side effects. 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 She continues to have mild abdominal pain and bloating. She has no neurological complaints.  She denies any recent fevers or illnesses.  She has no chest pain or shortness of breath.  She denies and nausea, vomiting, constipation, or diarrhea. She has no melena or hematochezia. She has no urinary complains.  Patient offers no further specific complaints.  REVIEW OF SYSTEMS:   Review of Systems  Constitutional: Negative for fever, malaise/fatigue and weight loss.  Respiratory: Negative.  Negative for cough and shortness of breath.   Cardiovascular: Negative.  Negative for chest pain.  Gastrointestinal: Positive for abdominal pain. Negative for blood in stool, constipation, diarrhea, melena, nausea and vomiting.  Genitourinary: Negative.   Musculoskeletal: Negative.   Neurological: Positive for sensory change. Negative for weakness.  Psychiatric/Behavioral: The patient is not nervous/anxious.     As per HPI. Otherwise, a complete review of systems is negative.  PAST MEDICAL HISTORY: Past Medical History:  Diagnosis Date  . Acid reflux   . Arthritis    left knee  . Wears contact lenses     PAST SURGICAL HISTORY: Past Surgical History:  Procedure Laterality Date  . ANTERIOR CRUCIATE LIGAMENT REPAIR Left   . CESAREAN SECTION    . COLONOSCOPY WITH PROPOFOL N/A 01/29/2016   Procedure: COLONOSCOPY WITH PROPOFOL;   Surgeon: Lucilla Lame, MD;  Location: [facility];  Service: Endoscopy;  Laterality: N/A;  . ESOPHAGOGASTRODUODENOSCOPY (EGD) WITH PROPOFOL N/A 01/29/2016   Procedure: ESOPHAGOGASTRODUODENOSCOPY (EGD) WITH PROPOFOL;  Surgeon: Lucilla Lame, MD;  Location: [facility];  Service: Endoscopy;  Laterality: N/A;  . PERIPHERAL VASCULAR CATHETERIZATION N/A 02/10/2016   Procedure: Glori Luis Cath Insertion;  Surgeon: Algernon Huxley, MD;  Location: [facility];  Service: Cardiovascular;  Laterality: N/A;    FAMILY HISTORY: Unknown.  Patient is adopted.     ADVANCED DIRECTIVES:    HEALTH MAINTENANCE: Social History  Substance Use Topics  . Smoking status: Former Research scientist (life sciences)  . Smokeless tobacco: Never Used     Comment: Quit in her 1's  . Alcohol use 0.0 oz/week     Comment: 1 drink/mo     Colonoscopy:  PAP:  Bone density:  Lipid panel:  No Known Allergies  Current Outpatient Prescriptions  Medication Sig Dispense Refill  . acyclovir (ZOVIRAX) 800 MG tablet TAKE ONE TABLET BY MOUTH EVERY DAY    . escitalopram (LEXAPRO) 10 MG tablet TAKE 1 TABLET (10 MG TOTAL) BY MOUTH DAILY.    Marland Kitchen ibuprofen (ADVIL,MOTRIN) 200 MG tablet Take 200 mg by mouth every 6 (six) hours as needed for moderate pain.     Marland Kitchen lidocaine-prilocaine (EMLA) cream Apply to affected area once 30 g 3  . ondansetron (ZOFRAN) 8 MG tablet Take 1 tablet (8 mg total) by mouth 2 (two) times daily as needed for refractory nausea / vomiting. 30 tablet 1  . pantoprazole (PROTONIX) 40 MG tablet Take 40 mg by mouth daily.     . prochlorperazine (COMPAZINE) 10 MG tablet  Take 1 tablet (10 mg total) by mouth every 6 (six) hours as needed (Nausea or vomiting). 30 tablet 1  . magic mouthwash SOLN Take 5 mLs by mouth 3 (three) times daily as needed for mouth pain. (Patient not taking: Reported on 03/01/2016) 240 mL 0   No current facility-administered medications for this visit.    Facility-Administered Medications Ordered in Other  Visits  Medication Dose Route Frequency Provider Last Rate Last Dose  . fluorouracil (ADRUCIL) 4,200 mg in sodium chloride 0.9 % 66 mL chemo infusion  2,400 mg/m2 (Treatment Plan Recorded) Intravenous 1 day or 1 dose Lloyd Huger, MD      . fluorouracil (ADRUCIL) chemo injection 700 mg  400 mg/m2 (Treatment Plan Recorded) Intravenous Once Lloyd Huger, MD      . leucovorin 700 mg in dextrose 5 % 250 mL infusion  400 mg/m2 (Treatment Plan Recorded) Intravenous Once Lloyd Huger, MD 143 mL/hr at 03/01/16 1115 700 mg at 03/01/16 1115  . oxaliplatin (ELOXATIN) 150 mg in dextrose 5 % 500 mL chemo infusion  85 mg/m2 (Treatment Plan Recorded) Intravenous Once Lloyd Huger, MD 265 mL/hr at 03/01/16 1115 150 mg at 03/01/16 1115    OBJECTIVE: Vitals:   03/01/16 0928  BP: 112/75  Pulse: 85  Resp: 16  Temp: 97.2 F (36.2 C)     Body mass index is 24.6 kg/m.    ECOG FS:0 - Asymptomatic  General: Well-developed, well-nourished, no acute distress. Eyes: Pink conjunctiva, anicteric sclera. Lungs: Clear to auscultation bilaterally. Heart: Regular rate and rhythm. No rubs, murmurs, or gallops. Abdomen: Soft, nontender, mildly distended.  Musculoskeletal: No edema, cyanosis, or clubbing. Neuro: Alert, answering all questions appropriately. Cranial nerves grossly intact. Skin: No rashes or petechiae noted. Psych: Normal affect.   LAB RESULTS:  Lab Results  Component Value Date   NA 134 (L) 03/01/2016   K 3.5 03/01/2016   CL 104 03/01/2016   CO2 24 03/01/2016   GLUCOSE 99 03/01/2016   BUN 10 03/01/2016   CREATININE 0.58 03/01/2016   CALCIUM 8.4 (L) 03/01/2016   PROT 6.9 03/01/2016   ALBUMIN 3.3 (L) 03/01/2016   AST 19 03/01/2016   ALT 10 (L) 03/01/2016   ALKPHOS 80 03/01/2016   BILITOT 0.3 03/01/2016   GFRNONAA >60 03/01/2016   GFRAA >60 03/01/2016    Lab Results  Component Value Date   WBC 3.9 03/01/2016   NEUTROABS 2.4 03/01/2016   HGB 10.5 (L) 03/01/2016     HCT 31.6 (L) 03/01/2016   MCV 77.7 (L) 03/01/2016   PLT 265 03/01/2016   Lab Results  Component Value Date   CA125 191.9 (H) 01/11/2016     STUDIES: US Paracentesis  Result Date: 02/12/2016 INDICATION: History of peritoneal carcinomatosis, now with recurrent symptomatic ascites. Please perform ultrasound-guided paracentesis for therapeutic purposes. EXAM: ULTRASOUND-GUIDED PARACENTESIS COMPARISON:  CT the abdomen pelvis - 01/11/2016; ultrasound-guided paracentesis - 01/18/2016 MEDICATIONS: None. COMPLICATIONS: None immediate. TECHNIQUE: Informed written consent was obtained from the patient after a discussion of the risks, benefits and alternatives to treatment. A timeout was performed prior to the initiation of the procedure. Initial ultrasound scanning demonstrates a moderate amount of ascites within the abdomen. Note is made of rather significant peritoneal and omental caking, findings compatible with recent abdominal CT. The right mid lateral abdomen was prepped and draped in the usual sterile fashion. 1% lidocaine with epinephrine was used for local anesthesia. Initially, an 8 [place] Safe-T-Centesis catheter was introduced however only a trace  amount of abdominal ascites could be aspirated secondary to apposition of the catheter against the peritoneal / omental caking. As such, under direct ultrasound guidance, a 19 gauge, 7-cm, Yueh catheter was introduced. An ultrasound image was saved for documentation purposed. The paracentesis was performed. The catheter was removed and a dressing was applied. The patient tolerated the procedure well without immediate post procedural complication. FINDINGS: A total of approximately 600 cc of serous fluid was removed. Postprocedural scanning demonstrated a small amount of residual intra-abdominal ascites. IMPRESSION: Successful ultrasound-guided paracentesis yielding 600 cc liters of peritoneal fluid. Note, there is incomplete evacuation of the abdominal  ascites secondary to significant peritoneal and omental caking which resulted in premature occlusion of the paracentesis catheter. Electronically Signed   By: Sandi Mariscal M.D.   On: 02/12/2016 10:30    ASSESSMENT: Peritoneal carcinomatosis, metastatic adenocarcinoma of likely lower GI primary.  PLAN:    1. Peritoneal carcinomatosis: Pathology results reviewed, immunohistochemistry suggested lower GI primary. CT results reviewed independently and reported as above with metastatic disease throughout the abdomen. Of note, patient had a normal colonoscopy on October 16, 2014 in [place], [place]. Patient also had second opinion at [facility] that concurs with the plan. Proceed with cycle 2 of 12 of FOLFOX. Return to clinic in 2 days for pump removal and then in 2 weeks for consideration of cycle 2. Plan to reimage after 6 cycles.  2: Genetic testing: Patient is adopted, but given her age and diagnosis have recommended patient undergo genetic testing to which she agreed. Blood work was sent today. 3. Anemia: Mild, monitor.  Patient expressed understanding and was in agreement with this plan. She also understands that She can call clinic at any time with any questions, concerns, or complaints.   No matching staging information was found for the patient.  Lloyd Huger, MD   03/01/2016 12:17 PM

## 2016-03-01 ENCOUNTER — Encounter: Payer: Self-pay | Admitting: Oncology

## 2016-03-01 ENCOUNTER — Inpatient Hospital Stay: Payer: BLUE CROSS/BLUE SHIELD

## 2016-03-01 ENCOUNTER — Inpatient Hospital Stay (HOSPITAL_BASED_OUTPATIENT_CLINIC_OR_DEPARTMENT_OTHER): Payer: BLUE CROSS/BLUE SHIELD | Admitting: Oncology

## 2016-03-01 VITALS — BP 112/75 | HR 85 | Temp 97.2°F | Resp 16 | Ht 64.0 in | Wt 143.3 lb

## 2016-03-01 DIAGNOSIS — Z79899 Other long term (current) drug therapy: Secondary | ICD-10-CM

## 2016-03-01 DIAGNOSIS — K219 Gastro-esophageal reflux disease without esophagitis: Secondary | ICD-10-CM

## 2016-03-01 DIAGNOSIS — C801 Malignant (primary) neoplasm, unspecified: Secondary | ICD-10-CM | POA: Diagnosis not present

## 2016-03-01 DIAGNOSIS — R140 Abdominal distension (gaseous): Secondary | ICD-10-CM

## 2016-03-01 DIAGNOSIS — C786 Secondary malignant neoplasm of retroperitoneum and peritoneum: Secondary | ICD-10-CM

## 2016-03-01 DIAGNOSIS — G629 Polyneuropathy, unspecified: Secondary | ICD-10-CM

## 2016-03-01 DIAGNOSIS — R109 Unspecified abdominal pain: Secondary | ICD-10-CM

## 2016-03-01 DIAGNOSIS — Z87891 Personal history of nicotine dependence: Secondary | ICD-10-CM

## 2016-03-01 DIAGNOSIS — D649 Anemia, unspecified: Secondary | ICD-10-CM

## 2016-03-01 DIAGNOSIS — M1990 Unspecified osteoarthritis, unspecified site: Secondary | ICD-10-CM

## 2016-03-01 LAB — CBC WITH DIFFERENTIAL/PLATELET
BASOS ABS: 0 10*3/uL (ref 0–0.1)
Basophils Relative: 1 %
EOS ABS: 0 10*3/uL (ref 0–0.7)
EOS PCT: 1 %
HCT: 31.6 % — ABNORMAL LOW (ref 35.0–47.0)
Hemoglobin: 10.5 g/dL — ABNORMAL LOW (ref 12.0–16.0)
Lymphocytes Relative: 25 %
Lymphs Abs: 1 10*3/uL (ref 1.0–3.6)
MCH: 25.7 pg — ABNORMAL LOW (ref 26.0–34.0)
MCHC: 33.1 g/dL (ref 32.0–36.0)
MCV: 77.7 fL — ABNORMAL LOW (ref 80.0–100.0)
Monocytes Absolute: 0.4 10*3/uL (ref 0.2–0.9)
Monocytes Relative: 11 %
NEUTROS PCT: 62 %
Neutro Abs: 2.4 10*3/uL (ref 1.4–6.5)
PLATELETS: 265 10*3/uL (ref 150–440)
RBC: 4.07 MIL/uL (ref 3.80–5.20)
RDW: 16.6 % — ABNORMAL HIGH (ref 11.5–14.5)
WBC: 3.9 10*3/uL (ref 3.6–11.0)

## 2016-03-01 LAB — COMPREHENSIVE METABOLIC PANEL
ALBUMIN: 3.3 g/dL — AB (ref 3.5–5.0)
ALT: 10 U/L — AB (ref 14–54)
AST: 19 U/L (ref 15–41)
Alkaline Phosphatase: 80 U/L (ref 38–126)
Anion gap: 6 (ref 5–15)
BUN: 10 mg/dL (ref 6–20)
CHLORIDE: 104 mmol/L (ref 101–111)
CO2: 24 mmol/L (ref 22–32)
CREATININE: 0.58 mg/dL (ref 0.44–1.00)
Calcium: 8.4 mg/dL — ABNORMAL LOW (ref 8.9–10.3)
GFR calc Af Amer: >60 mL/min (ref >60)
GFR calc non Af Amer: >60 mL/min (ref >60)
Glucose, Bld: 99 mg/dL (ref 65–99)
Potassium: 3.5 mmol/L (ref 3.5–5.1)
SODIUM: 134 mmol/L — AB (ref 135–145)
Total Bilirubin: 0.3 mg/dL (ref 0.3–1.2)
Total Protein: 6.9 g/dL (ref 6.5–8.1)

## 2016-03-01 MED ORDER — OXALIPLATIN CHEMO INJECTION 100 MG/20ML
85.0000 mg/m2 | Freq: Once | INTRAVENOUS | Status: AC
  Administered 2016-03-01: 150 mg via INTRAVENOUS
  Filled 2016-03-01: qty 10

## 2016-03-01 MED ORDER — PALONOSETRON HCL INJECTION 0.25 MG/5ML
0.2500 mg | Freq: Once | INTRAVENOUS | Status: AC
  Administered 2016-03-01: 0.25 mg via INTRAVENOUS
  Filled 2016-03-01: qty 5

## 2016-03-01 MED ORDER — SODIUM CHLORIDE 0.9 % IV SOLN
10.0000 mg | Freq: Once | INTRAVENOUS | Status: AC
  Administered 2016-03-01: 10 mg via INTRAVENOUS
  Filled 2016-03-01: qty 1

## 2016-03-01 MED ORDER — SODIUM CHLORIDE 0.9 % IV SOLN
2400.0000 mg/m2 | INTRAVENOUS | Status: DC
  Administered 2016-03-01: 4200 mg via INTRAVENOUS
  Filled 2016-03-01: qty 84

## 2016-03-01 MED ORDER — LEUCOVORIN CALCIUM INJECTION 350 MG
400.0000 mg/m2 | Freq: Once | INTRAVENOUS | Status: AC
  Administered 2016-03-01: 700 mg via INTRAVENOUS
  Filled 2016-03-01: qty 35

## 2016-03-01 MED ORDER — DEXTROSE 5 % IV SOLN
Freq: Once | INTRAVENOUS | Status: AC
  Administered 2016-03-01: 11:00:00 via INTRAVENOUS
  Filled 2016-03-01: qty 1000

## 2016-03-01 MED ORDER — FLUOROURACIL CHEMO INJECTION 2.5 GM/50ML
400.0000 mg/m2 | Freq: Once | INTRAVENOUS | Status: AC
  Administered 2016-03-01: 700 mg via INTRAVENOUS
  Filled 2016-03-01: qty 14

## 2016-03-01 NOTE — Progress Notes (Signed)
No changes since last visit. 

## 2016-03-03 ENCOUNTER — Inpatient Hospital Stay: Payer: BLUE CROSS/BLUE SHIELD

## 2016-03-03 VITALS — BP 113/72 | HR 80 | Temp 97.2°F | Resp 18

## 2016-03-03 DIAGNOSIS — C801 Malignant (primary) neoplasm, unspecified: Principal | ICD-10-CM

## 2016-03-03 DIAGNOSIS — C786 Secondary malignant neoplasm of retroperitoneum and peritoneum: Secondary | ICD-10-CM

## 2016-03-03 MED ORDER — SODIUM CHLORIDE 0.9% FLUSH
10.0000 mL | INTRAVENOUS | Status: DC | PRN
  Administered 2016-03-03: 10 mL
  Filled 2016-03-03: qty 10

## 2016-03-03 MED ORDER — HEPARIN SOD (PORK) LOCK FLUSH 100 UNIT/ML IV SOLN
INTRAVENOUS | Status: AC
  Filled 2016-03-03: qty 5

## 2016-03-03 MED ORDER — HEPARIN SOD (PORK) LOCK FLUSH 100 UNIT/ML IV SOLN
500.0000 [IU] | Freq: Once | INTRAVENOUS | Status: AC | PRN
  Administered 2016-03-03: 500 [IU]

## 2016-03-03 NOTE — Progress Notes (Signed)
Patient returned to clinic to d/c pump.  Stated that she has itchy rash on chest and abdomen.  Dr. Grayland Ormond informed.  He ask that patient monitor for now to make sure it does not get worse and take benadryl if needed. Patient understands these directions.

## 2016-03-10 ENCOUNTER — Ambulatory Visit (INDEPENDENT_AMBULATORY_CARE_PROVIDER_SITE_OTHER): Payer: BLUE CROSS/BLUE SHIELD | Admitting: Family Medicine

## 2016-03-10 ENCOUNTER — Encounter: Payer: Self-pay | Admitting: Family Medicine

## 2016-03-10 VITALS — BP 125/85 | HR 85 | Temp 98.7°F | Ht 63.4 in | Wt 139.0 lb

## 2016-03-10 DIAGNOSIS — B009 Herpesviral infection, unspecified: Secondary | ICD-10-CM | POA: Diagnosis not present

## 2016-03-10 DIAGNOSIS — E785 Hyperlipidemia, unspecified: Secondary | ICD-10-CM | POA: Diagnosis not present

## 2016-03-10 DIAGNOSIS — F419 Anxiety disorder, unspecified: Secondary | ICD-10-CM | POA: Insufficient documentation

## 2016-03-10 DIAGNOSIS — L719 Rosacea, unspecified: Secondary | ICD-10-CM

## 2016-03-10 DIAGNOSIS — C801 Malignant (primary) neoplasm, unspecified: Secondary | ICD-10-CM

## 2016-03-10 DIAGNOSIS — C786 Secondary malignant neoplasm of retroperitoneum and peritoneum: Secondary | ICD-10-CM

## 2016-03-10 DIAGNOSIS — Z23 Encounter for immunization: Secondary | ICD-10-CM | POA: Diagnosis not present

## 2016-03-10 DIAGNOSIS — F411 Generalized anxiety disorder: Secondary | ICD-10-CM

## 2016-03-10 MED ORDER — ACYCLOVIR 800 MG PO TABS: 800.0000 mg | ORAL_TABLET | Freq: Every day | ORAL | 1 refills | Status: DC

## 2016-03-10 MED ORDER — ESCITALOPRAM OXALATE 10 MG PO TABS: ORAL_TABLET | ORAL | 1 refills | Status: DC

## 2016-03-10 NOTE — Progress Notes (Signed)
BP 125/85 (BP Location: Left Arm, Patient Position: Sitting, Cuff Size: Normal)   Pulse 85   Temp 98.7 F (37.1 C)   Ht 5' 3.4" (1.61 m)   Wt 139 lb (63 kg)   SpO2 98%   BMI 24.31 kg/m    Subjective:    Patient ID: Natale Lay, female    DOB: September 01, 1960, 55 y.o.   MRN: [id number]  HPI: ORELIA SPERBECK is a 55 y.o. female who presents today to establish care.   Chief Complaint  Patient presents with  . [place] presented to the ER at the beginning of July for chronic abdominal pain that had concerned her and her husband because it wasn't going away after 4 months. Had had a colonoscopy done a year ago that had diverticulosis and polyps, but just a routine screening. Started on protonix, which helped heart burn, but not abdominal pain. 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 Large amount of free fluid with nodular soft tissue caking.  While in the ER on 7/10, patient was hooked up with Dr. Adonis Huguenin and Dr. Grayland Ormond. She saw Dr. Grayland Ormond 1st time in his office on 01/14/16. Had an elevated CA-125, he was very concerned for underlying ovarian cancer. He got her a referral to GYN-Onc and set her up for a PET scan. He also set her up for genetic testing, as she is adopted.   Had biopsy done which showed metastatic adenocarcinoma with signet ring cells concerning for metastatic GI malignancy, was set up to have CT of her chest with EGD and colonoscopy.   01/25/16- CT of the chest showed no pulmonary metastatic disease.  01/29/16- EGD normal (no biopsies) 01/29/16- Colonoscopy- erythematous mucosa at appediceal orifice (Biopsied)   Has had port placed (02/10/16). Started on Chemo 03/03/16 (FOLFOX)  Has had a 2nd opinion through [facility]. They agree that it is from the GI tract and did not start with GYN organs.   Had to have another paracentesis done 02/12/16 due to recurrent ascites.  Has been doing OK.  Tolerating every thing pretty well. Has a history of anxiety, but doing well with her lexapro. Finally started feeling better. Not having as much bloating and pressure. Hasn't had any pain.   Health maintenance-  Colonoscopy 2016- benign polyps removed Last Pap- 2016, all have been normal Last Mammo- 2016 in [facility]- thinks she is up to date Pneumovax- never had  Active Ambulatory Problems    Diagnosis Date Noted  . Peritoneal carcinomatosis ([place]) 01/20/2016  . Abnormal findings-gastrointestinal tract   . Other specified diseases of intestine   . Anxiety disorder 03/10/2016  . Rosacea   . HSV-2 (herpes simplex virus 2) infection   . Hyperlipidemia    Resolved Ambulatory Problems    Diagnosis Date Noted  . No Resolved Ambulatory Problems   Past Medical History:  Diagnosis Date  . Acid reflux   . Allergic rhinitis   . Anxiety   . Arthritis   . Cancer ([place])   . Cervical spondylosis   . CTS (carpal tunnel syndrome)   . Diverticulosis   . HSV-2 (herpes simplex virus 2) infection   . Hyperlipidemia   . Ovarian failure   . Rosacea   . Wears contact lenses    No Known Allergies  Past Surgical History:  Procedure Laterality Date  . ANTERIOR CRUCIATE LIGAMENT REPAIR Left   . CESAREAN SECTION    .  COLONOSCOPY WITH PROPOFOL N/A 01/29/2016   Procedure: COLONOSCOPY WITH PROPOFOL;  Surgeon: Lucilla Lame, MD;  Location: [facility];  Service: Endoscopy;  Laterality: N/A;  . ESOPHAGOGASTRODUODENOSCOPY (EGD) WITH PROPOFOL N/A 01/29/2016   Procedure: ESOPHAGOGASTRODUODENOSCOPY (EGD) WITH PROPOFOL;  Surgeon: Lucilla Lame, MD;  Location: [facility];  Service: Endoscopy;  Laterality: N/A;  . PERIPHERAL VASCULAR CATHETERIZATION N/A 02/10/2016   Procedure: Glori Luis Cath Insertion;  Surgeon: Algernon Huxley, MD;  Location: [facility];  Service: Cardiovascular;  Laterality: N/A;   Social History   Social History  . Marital status: Married    Spouse name: N/A  .  Number of children: 3  . Years of education: N/A   Occupational History  . Homemaker    Social History Main Topics  . Smoking status: Former Research scientist (life sciences)  . Smokeless tobacco: Never Used     Comment: Quit in her 5's  . Alcohol use 0.0 oz/week     Comment: 1 drink/mo  . Drug use: No  . Sexual activity: Yes   Other Topics Concern  . Not on file   Social History Narrative  . No narrative on file   Family History  Problem Relation Age of Onset  . Adopted: Yes   Review of Systems  Constitutional: Negative.   Respiratory: Negative.   Cardiovascular: Negative.   Gastrointestinal: Positive for abdominal distention. Negative for abdominal pain, anal bleeding, blood in stool, constipation, diarrhea, nausea, rectal pain and vomiting.  Psychiatric/Behavioral: Negative.     Per HPI unless specifically indicated above     Objective:    BP 125/85 (BP Location: Left Arm, Patient Position: Sitting, Cuff Size: Normal)   Pulse 85   Temp 98.7 F (37.1 C)   Ht 5' 3.4" (1.61 m)   Wt 139 lb (63 kg)   SpO2 98%   BMI 24.31 kg/m   Wt Readings from Last 3 Encounters:  03/10/16 139 lb (63 kg)  03/01/16 143 lb 4.8 oz (65 kg)  02/16/16 146 lb 15 oz (66.7 kg)    Physical Exam  Constitutional: She is oriented to person, place, and time. She appears well-developed and well-nourished. No distress.  HENT:  Head: Normocephalic and atraumatic.  Right Ear: Hearing normal.  Left Ear: Hearing normal.  Nose: Nose normal.  Eyes: Conjunctivae and lids are normal. Right eye exhibits no discharge. Left eye exhibits no discharge. No scleral icterus.  Cardiovascular: Normal rate, regular rhythm, normal heart sounds and intact distal pulses.  Exam reveals no gallop and no friction rub.   No murmur heard. Pulmonary/Chest: Effort normal and breath sounds normal. No respiratory distress. She has no wheezes. She has no rales. She exhibits no tenderness.  Abdominal: Soft. Bowel sounds are normal. She exhibits  distension. She exhibits no mass. There is no tenderness. There is no rebound and no guarding.  Musculoskeletal: Normal range of motion.  Neurological: She is alert and oriented to person, place, and time.  Skin: Skin is warm, dry and intact. No rash noted. No erythema. No pallor.  Psychiatric: She has a normal mood and affect. Her speech is normal and behavior is normal. Judgment and thought content normal. Cognition and memory are normal.  Nursing note and vitals reviewed.   Results for orders placed or performed in visit on 03/10/16  HM MAMMOGRAPHY  Result Value Ref Range   HM Mammogram 0-4 Bi-Rad 0-4 Bi-Rad, Self Reported Normal  HM PAP SMEAR  Result Value Ref Range   HM Pap smear Neg  Pap, No cotesting       Assessment & Plan:   Problem List Items Addressed This Visit      Musculoskeletal and Integument   Rosacea    Was seeing dermatology in the past. Had laser treatment.         Other   Peritoneal carcinomatosis ([place])    Continue to follow with oncology. Tolerating chemo well. Continue to monitor.       Relevant Medications   acyclovir (ZOVIRAX) 800 MG tablet   Anxiety disorder    Under good control. Continue current regimen. Continue to monitor. Refill given today.       HSV-2 (herpes simplex virus 2) infection    Refill of acyclovir given today.       Relevant Medications   acyclovir (ZOVIRAX) 800 MG tablet   Hyperlipidemia    Will recheck in 6 months.        Other Visit Diagnoses    Immunization due    -  Primary   Pneumovax given today, will check with Dr. Grayland Ormond about when to have her flu shot.    Relevant Orders   Pneumococcal polysaccharide vaccine 23-valent greater than or equal to 2yo subcutaneous/IM (Completed)       Follow up plan: Return in about 6 months (around 09/07/2016) for Follow up anxiety, Records release please.

## 2016-03-10 NOTE — Patient Instructions (Addendum)
Pneumococcal Polysaccharide Vaccine: What You Need to Know  1. Why get vaccinated?  Vaccination can protect older adults (and some children and younger adults) from pneumococcal disease.  Pneumococcal disease is caused by bacteria that can spread from person to person through close contact. It can cause ear infections, and it can also lead to more serious infections of the:   · Lungs (pneumonia),  · Blood (bacteremia), and  · Covering of the brain and spinal cord (meningitis). Meningitis can cause deafness and brain damage, and it can be fatal.  Anyone can get pneumococcal disease, but children under 2 years of age, people with certain medical conditions, adults over 65 years of age, and cigarette smokers are at the highest risk.  About 18,000 older adults die each year from pneumococcal disease in the [place].  Treatment of pneumococcal infections with penicillin and other drugs used to be more effective. But some strains of the disease have become resistant to these drugs. This makes prevention of the disease, through vaccination, even more important.  2. Pneumococcal polysaccharide vaccine (PPSV23)  Pneumococcal polysaccharide vaccine (PPSV23) protects against 23 types of pneumococcal bacteria. It will not prevent all pneumococcal disease.  PPSV23 is recommended for:  · All adults 65 years of age and older,  · Anyone 2 through 57 years of age with certain long-term health problems,  · Anyone 2 through 57 years of age with a weakened immune system,  · Adults 19 through 57 years of age who smoke cigarettes or have asthma.  Most people need only one dose of PPSV. A second dose is recommended for certain high-risk groups. People 65 and older should get a dose even if they have gotten one or more doses of the vaccine before they turned 65.  Your healthcare provider can give you more information about these recommendations.  Most healthy adults develop protection within 2 to 3 weeks of getting the shot.  3. Some  people should not get this vaccine  · Anyone who has had a life-threatening allergic reaction to PPSV should not get another dose.  · Anyone who has a severe allergy to any component of PPSV should not receive it. Tell your provider if you have any severe allergies.  · Anyone who is moderately or severely ill when the shot is scheduled may be asked to wait until they recover before getting the vaccine. Someone with a mild illness can usually be vaccinated.  · Children less than 2 years of age should not receive this vaccine.  · There is no evidence that PPSV is harmful to either a pregnant woman or to her fetus. However, as a precaution, women who need the vaccine should be vaccinated before becoming pregnant, if possible.  4. Risks of a vaccine reaction  With any medicine, including vaccines, there is a chance of side effects. These are usually mild and go away on their own, but serious reactions are also possible.  About half of people who get PPSV have mild side effects, such as redness or pain where the shot is given, which go away within about two days.  Less than 1 out of 100 people develop a fever, muscle aches, or more severe local reactions.  Problems that could happen after any vaccine:  · People sometimes faint after a medical procedure, including vaccination. Sitting or lying down for about 15 minutes can help prevent fainting, and injuries caused by a fall. Tell your doctor if you feel dizzy, or have vision changes or   ringing in the ears.  · Some people get severe pain in the shoulder and have difficulty moving the arm where a shot was given. This happens very rarely.  · Any medication can cause a severe allergic reaction. Such reactions from a vaccine are very rare, estimated at about 1 in a million doses, and would happen within a few minutes to a few hours after the vaccination.  As with any medicine, there is a very remote chance of a vaccine causing a serious injury or death.  The safety of  vaccines is always being monitored. For more information, visit: www.cdc.gov/vaccinesafety/  5. What if there is a serious reaction?  What should I look for?  Look for anything that concerns you, such as signs of a severe allergic reaction, very high fever, or unusual behavior.   Signs of a severe allergic reaction can include hives, swelling of the face and throat, difficulty breathing, a fast heartbeat, dizziness, and weakness. These would usually start a few minutes to a few hours after the vaccination.  What should I do?  If you think it is a severe allergic reaction or other emergency that can't wait, call 9-1-1 or get to the nearest hospital. Otherwise, call your doctor.  Afterward, the reaction should be reported to the Vaccine Adverse Event Reporting System (VAERS). Your doctor might file this report, or you can do it yourself through the VAERS web site at www.vaers.hhs.gov, or by calling 1-800-822-7967.   VAERS does not give medical advice.  6. How can I learn more?  · Ask your doctor. He or she can give you the vaccine package insert or suggest other sources of information.  · Call your local or state health department.  · Contact the Centers for Disease Control and Prevention (CDC):    Call 1-800-232-4636 (1-800-CDC-INFO) or    Visit CDC's website at www.cdc.gov/vaccines  CDC Pneumococcal Polysaccharide Vaccine VIS (10/25/13)     This information is not intended to replace advice given to you by your health care provider. Make sure you discuss any questions you have with your health care provider.     Document Released: 04/17/2006 Document Revised: 07/11/2014 Document Reviewed: 10/28/2013  Elsevier Interactive Patient Education ©2016 Elsevier Inc.

## 2016-03-10 NOTE — Assessment & Plan Note (Signed)
Will recheck in 6 months.

## 2016-03-10 NOTE — Assessment & Plan Note (Signed)
Under good control. Continue current regimen. Continue to monitor. Refill given today. 

## 2016-03-10 NOTE — Assessment & Plan Note (Signed)
Continue to follow with oncology. Tolerating chemo well. Continue to monitor.

## 2016-03-10 NOTE — Assessment & Plan Note (Signed)
Was seeing dermatology in the past. Had laser treatment.

## 2016-03-10 NOTE — Assessment & Plan Note (Signed)
Refill of acyclovir given today.

## 2016-03-14 NOTE — Progress Notes (Signed)
[place]  Telephone:(336) [phone] Fax:(336) [phone]  ID: Valerie Horton OB: [birth year]  MR#: [id number]  [id number]  Patient Care Team: Provider Not In System as PCP - General Clent Jacks, RN as Registered Nurse  CHIEF COMPLAINT: Peritoneal carcinomatosis, metastatic adenocarcinoma of likely lower GI primary.  INTERVAL HISTORY: Patient returns to clinic today for further evaluation and initiation of cycle 3 of 12 of FOLFOX. She had some mild nausea after her last treatment, but this resolves with her prescription medication. 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 Her abdominal pain and bloating have improved. She has no neurological complaints.  She denies any recent fevers or illnesses.  She has no chest pain or shortness of breath.  She denies and nausea, vomiting, constipation, or diarrhea. She has no melena or hematochezia. She has no urinary complains.  Patient offers no further specific complaints.  REVIEW OF SYSTEMS:   Review of Systems  Constitutional: Negative for fever, malaise/fatigue and weight loss.  Respiratory: Negative.  Negative for cough and shortness of breath.   Cardiovascular: Negative.  Negative for chest pain.  Gastrointestinal: Positive for abdominal pain. Negative for blood in stool, constipation, diarrhea, melena, nausea and vomiting.  Genitourinary: Negative.   Musculoskeletal: Negative.   Neurological: Positive for sensory change. Negative for weakness.  Psychiatric/Behavioral: The patient is not nervous/anxious.     As per HPI. Otherwise, a complete review of systems is negative.  PAST MEDICAL HISTORY: Past Medical History:  Diagnosis Date  . Acid reflux   . Allergic rhinitis   . Anxiety   . Arthritis    left knee  . Cancer ([place])   . Cervical spondylosis   . CTS (carpal tunnel syndrome)    Right  . Diverticulosis   . HSV-2 (herpes simplex virus 2) infection   . Hyperlipidemia   . Ovarian failure   .  Rosacea   . Wears contact lenses     PAST SURGICAL HISTORY: Past Surgical History:  Procedure Laterality Date  . ANTERIOR CRUCIATE LIGAMENT REPAIR Left   . CESAREAN SECTION    . COLONOSCOPY WITH PROPOFOL N/A 01/29/2016   Procedure: COLONOSCOPY WITH PROPOFOL;  Surgeon: Lucilla Lame, MD;  Location: [facility] [place];  Service: Endoscopy;  Laterality: N/A;  . ESOPHAGOGASTRODUODENOSCOPY (EGD) WITH PROPOFOL N/A 01/29/2016   Procedure: ESOPHAGOGASTRODUODENOSCOPY (EGD) WITH PROPOFOL;  Surgeon: Lucilla Lame, MD;  Location: [place];  Service: Endoscopy;  Laterality: N/A;  . PERIPHERAL VASCULAR CATHETERIZATION N/A 02/10/2016   Procedure: Glori Luis Cath Insertion;  Surgeon: Algernon Huxley, MD;  Location: [facility];  Service: Cardiovascular;  Laterality: N/A;    FAMILY HISTORY: Unknown.  Patient is adopted.     ADVANCED DIRECTIVES:    HEALTH MAINTENANCE: Social History  Substance Use Topics  . Smoking status: Former Research scientist (life sciences)  . Smokeless tobacco: Never Used     Comment: Quit in her 11's  . Alcohol use 0.0 oz/week     Comment: 1 drink/mo     Colonoscopy:  PAP:  Bone density:  Lipid panel:  No Known Allergies  Current Outpatient Prescriptions  Medication Sig Dispense Refill  . acyclovir (ZOVIRAX) 800 MG tablet Take 1 tablet (800 mg total) by mouth daily. 90 tablet 1  . escitalopram (LEXAPRO) 10 MG tablet TAKE 1 TABLET (10 MG TOTAL) BY MOUTH DAILY. 90 tablet 1  . lidocaine-prilocaine (EMLA) cream Apply to affected area once 30 g 3  . magic mouthwash SOLN Take 5 mLs by mouth 3 (three)  times daily as needed for mouth pain. 240 mL 0  . ondansetron (ZOFRAN) 8 MG tablet Take 1 tablet (8 mg total) by mouth 2 (two) times daily as needed for refractory nausea / vomiting. 30 tablet 1  . pantoprazole (PROTONIX) 40 MG tablet Take 40 mg by mouth daily.     . prochlorperazine (COMPAZINE) 10 MG tablet Take 1 tablet (10 mg total) by mouth every 6 (six) hours as needed (Nausea or  vomiting). 30 tablet 1   No current facility-administered medications for this visit.    Facility-Administered Medications Ordered in Other Visits  Medication Dose Route Frequency Provider Last Rate Last Dose  . fluorouracil (ADRUCIL) 4,200 mg in sodium chloride 0.9 % 66 mL chemo infusion  2,400 mg/m2 (Treatment Plan Recorded) Intravenous 1 day or 1 dose Lloyd Huger, MD      . fluorouracil (ADRUCIL) chemo injection 700 mg  400 mg/m2 (Treatment Plan Recorded) Intravenous Once Lloyd Huger, MD      . heparin lock flush 100 unit/mL  500 Units Intracatheter Once PRN Lloyd Huger, MD      . leucovorin 700 mg in dextrose 5 % 250 mL infusion  400 mg/m2 (Treatment Plan Recorded) Intravenous Once Lloyd Huger, MD 143 mL/hr at 03/15/16 1030 700 mg at 03/15/16 1030  . oxaliplatin (ELOXATIN) 150 mg in dextrose 5 % 500 mL chemo infusion  85 mg/m2 (Treatment Plan Recorded) Intravenous Once Lloyd Huger, MD 265 mL/hr at 03/15/16 1030 150 mg at 03/15/16 1030  . sodium chloride flush (NS) 0.9 % injection 10 mL  10 mL Intracatheter PRN Lloyd Huger, MD        OBJECTIVE: Vitals:   03/15/16 0903  BP: 125/75  Pulse: 82  Resp: 18  Temp: 97.3 F (36.3 C)     Body mass index is 24.29 kg/m.    ECOG FS:0 - Asymptomatic  General: Well-developed, well-nourished, no acute distress. Eyes: Pink conjunctiva, anicteric sclera. Lungs: Clear to auscultation bilaterally. Heart: Regular rate and rhythm. No rubs, murmurs, or gallops. Abdomen: Soft, nontender, mildly distended.  Musculoskeletal: No edema, cyanosis, or clubbing. Neuro: Alert, answering all questions appropriately. Cranial nerves grossly intact. Skin: No rashes or petechiae noted. Psych: Normal affect.   LAB RESULTS:  Lab Results  Component Value Date   NA 131 (L) 03/15/2016   K 3.7 03/15/2016   CL 102 03/15/2016   CO2 22 03/15/2016   GLUCOSE 102 (H) 03/15/2016   BUN 12 03/15/2016   CREATININE 0.62  03/15/2016   CALCIUM 8.3 (L) 03/15/2016   PROT 7.0 03/15/2016   ALBUMIN 3.5 03/15/2016   AST 24 03/15/2016   ALT 15 03/15/2016   ALKPHOS 86 03/15/2016   BILITOT 0.4 03/15/2016   GFRNONAA >60 03/15/2016   GFRAA >60 03/15/2016    Lab Results  Component Value Date   WBC 4.2 03/15/2016   NEUTROABS 2.6 03/15/2016   HGB 10.5 (L) 03/15/2016   HCT 31.0 (L) 03/15/2016   MCV 77.8 (L) 03/15/2016   PLT 168 03/15/2016   Lab Results  Component Value Date   CA125 191.9 (H) 01/11/2016     STUDIES: No results found.  ASSESSMENT: Peritoneal carcinomatosis, metastatic adenocarcinoma of likely lower GI primary.  PLAN:    1. Peritoneal carcinomatosis: Pathology results reviewed, immunohistochemistry suggested lower GI primary. CT results reviewed independently and reported as above with metastatic disease throughout the abdomen. Of note, patient had a normal colonoscopy on October 16, 2014 in [place], [place]. Patient  also had second opinion at [facility] that concurs with the plan. Proceed with cycle 3 of 12 of FOLFOX. Return to clinic in 2 days for pump removal and then in 2 weeks for consideration of cycle 4. Plan to reimage after 6 cycles.  2: Genetic testing: Patient is adopted, but given her age and diagnosis have recommended patient undergo genetic testing to which she agreed. Results are pending at time of dictation. 3. Anemia: Mild, monitor.  Patient expressed understanding and was in agreement with this plan. She also understands that She can call clinic at any time with any questions, concerns, or complaints.   No matching staging information was found for the patient.  Lloyd Huger, MD   03/15/2016 11:58 AM

## 2016-03-15 ENCOUNTER — Inpatient Hospital Stay: Payer: BLUE CROSS/BLUE SHIELD | Attending: Oncology | Admitting: Oncology

## 2016-03-15 ENCOUNTER — Inpatient Hospital Stay: Payer: BLUE CROSS/BLUE SHIELD

## 2016-03-15 VITALS — BP 125/75 | HR 82 | Temp 97.3°F | Resp 18 | Wt 138.9 lb

## 2016-03-15 DIAGNOSIS — C801 Malignant (primary) neoplasm, unspecified: Principal | ICD-10-CM

## 2016-03-15 DIAGNOSIS — G629 Polyneuropathy, unspecified: Secondary | ICD-10-CM | POA: Insufficient documentation

## 2016-03-15 DIAGNOSIS — E785 Hyperlipidemia, unspecified: Secondary | ICD-10-CM | POA: Diagnosis not present

## 2016-03-15 DIAGNOSIS — C786 Secondary malignant neoplasm of retroperitoneum and peritoneum: Secondary | ICD-10-CM

## 2016-03-15 DIAGNOSIS — T451X5S Adverse effect of antineoplastic and immunosuppressive drugs, sequela: Secondary | ICD-10-CM | POA: Insufficient documentation

## 2016-03-15 DIAGNOSIS — D649 Anemia, unspecified: Secondary | ICD-10-CM | POA: Insufficient documentation

## 2016-03-15 DIAGNOSIS — K219 Gastro-esophageal reflux disease without esophagitis: Secondary | ICD-10-CM | POA: Insufficient documentation

## 2016-03-15 DIAGNOSIS — K5790 Diverticulosis of intestine, part unspecified, without perforation or abscess without bleeding: Secondary | ICD-10-CM | POA: Insufficient documentation

## 2016-03-15 DIAGNOSIS — M1712 Unilateral primary osteoarthritis, left knee: Secondary | ICD-10-CM | POA: Diagnosis not present

## 2016-03-15 DIAGNOSIS — Z87891 Personal history of nicotine dependence: Secondary | ICD-10-CM | POA: Diagnosis not present

## 2016-03-15 DIAGNOSIS — R110 Nausea: Secondary | ICD-10-CM | POA: Insufficient documentation

## 2016-03-15 DIAGNOSIS — Z79899 Other long term (current) drug therapy: Secondary | ICD-10-CM | POA: Diagnosis not present

## 2016-03-15 DIAGNOSIS — R109 Unspecified abdominal pain: Secondary | ICD-10-CM | POA: Insufficient documentation

## 2016-03-15 DIAGNOSIS — M1990 Unspecified osteoarthritis, unspecified site: Secondary | ICD-10-CM | POA: Diagnosis not present

## 2016-03-15 DIAGNOSIS — D61810 Antineoplastic chemotherapy induced pancytopenia: Secondary | ICD-10-CM | POA: Insufficient documentation

## 2016-03-15 LAB — COMPREHENSIVE METABOLIC PANEL
ALK PHOS: 86 U/L (ref 38–126)
ALT: 15 U/L (ref 14–54)
AST: 24 U/L (ref 15–41)
Albumin: 3.5 g/dL (ref 3.5–5.0)
Anion gap: 7 (ref 5–15)
BILIRUBIN TOTAL: 0.4 mg/dL (ref 0.3–1.2)
BUN: 12 mg/dL (ref 6–20)
CALCIUM: 8.3 mg/dL — AB (ref 8.9–10.3)
CO2: 22 mmol/L (ref 22–32)
CREATININE: 0.62 mg/dL (ref 0.44–1.00)
Chloride: 102 mmol/L (ref 101–111)
GFR calc non Af Amer: >60 mL/min (ref >60)
Glucose, Bld: 102 mg/dL — ABNORMAL HIGH (ref 65–99)
Potassium: 3.7 mmol/L (ref 3.5–5.1)
SODIUM: 131 mmol/L — AB (ref 135–145)
TOTAL PROTEIN: 7 g/dL (ref 6.5–8.1)

## 2016-03-15 LAB — CBC WITH DIFFERENTIAL/PLATELET
Basophils Absolute: 0 10*3/uL (ref 0–0.1)
Basophils Relative: 1 %
EOS ABS: 0 10*3/uL (ref 0–0.7)
Eosinophils Relative: 0 %
HCT: 31 % — ABNORMAL LOW (ref 35.0–47.0)
Hemoglobin: 10.5 g/dL — ABNORMAL LOW (ref 12.0–16.0)
LYMPHS ABS: 1 10*3/uL (ref 1.0–3.6)
LYMPHS PCT: 25 %
MCH: 26.4 pg (ref 26.0–34.0)
MCHC: 34 g/dL (ref 32.0–36.0)
MCV: 77.8 fL — AB (ref 80.0–100.0)
Monocytes Absolute: 0.6 10*3/uL (ref 0.2–0.9)
Monocytes Relative: 14 %
NEUTROS ABS: 2.6 10*3/uL (ref 1.4–6.5)
NEUTROS PCT: 60 %
Platelets: 168 10*3/uL (ref 150–440)
RBC: 3.99 MIL/uL (ref 3.80–5.20)
RDW: 17.6 % — ABNORMAL HIGH (ref 11.5–14.5)
WBC: 4.2 10*3/uL (ref 3.6–11.0)

## 2016-03-15 MED ORDER — DEXTROSE 5 % IV SOLN
Freq: Once | INTRAVENOUS | Status: AC
  Administered 2016-03-15: 10:00:00 via INTRAVENOUS
  Filled 2016-03-15: qty 1000

## 2016-03-15 MED ORDER — HEPARIN SOD (PORK) LOCK FLUSH 100 UNIT/ML IV SOLN: 500.0000 [IU] | Freq: Once | INTRAVENOUS | Status: DC | PRN

## 2016-03-15 MED ORDER — SODIUM CHLORIDE 0.9% FLUSH
10.0000 mL | INTRAVENOUS | Status: DC | PRN
  Filled 2016-03-15: qty 10

## 2016-03-15 MED ORDER — DEXTROSE 5 % IV SOLN
85.0000 mg/m2 | Freq: Once | INTRAVENOUS | Status: AC
  Administered 2016-03-15: 150 mg via INTRAVENOUS
  Filled 2016-03-15: qty 20

## 2016-03-15 MED ORDER — SODIUM CHLORIDE 0.9 % IV SOLN
2400.0000 mg/m2 | INTRAVENOUS | Status: DC
  Administered 2016-03-15: 4200 mg via INTRAVENOUS
  Filled 2016-03-15: qty 84

## 2016-03-15 MED ORDER — PALONOSETRON HCL INJECTION 0.25 MG/5ML
0.2500 mg | Freq: Once | INTRAVENOUS | Status: AC
  Administered 2016-03-15: 0.25 mg via INTRAVENOUS
  Filled 2016-03-15: qty 5

## 2016-03-15 MED ORDER — SODIUM CHLORIDE 0.9 % IV SOLN
10.0000 mg | Freq: Once | INTRAVENOUS | Status: AC
Start: 2016-03-15 — End: 2016-03-15
  Administered 2016-03-15: 10 mg via INTRAVENOUS
  Filled 2016-03-15: qty 1

## 2016-03-15 MED ORDER — LEUCOVORIN CALCIUM INJECTION 350 MG
400.0000 mg/m2 | Freq: Once | INTRAVENOUS | Status: AC
  Administered 2016-03-15: 700 mg via INTRAVENOUS
  Filled 2016-03-15: qty 35

## 2016-03-15 MED ORDER — FLUOROURACIL CHEMO INJECTION 2.5 GM/50ML
400.0000 mg/m2 | Freq: Once | INTRAVENOUS | Status: AC
  Administered 2016-03-15: 700 mg via INTRAVENOUS
  Filled 2016-03-15: qty 14

## 2016-03-15 NOTE — Progress Notes (Signed)
States is feeling well. Tolerated last treatment well. States felt nauseated once and had mild fatigue.

## 2016-03-17 ENCOUNTER — Inpatient Hospital Stay: Payer: BLUE CROSS/BLUE SHIELD

## 2016-03-17 DIAGNOSIS — C786 Secondary malignant neoplasm of retroperitoneum and peritoneum: Secondary | ICD-10-CM

## 2016-03-17 DIAGNOSIS — C801 Malignant (primary) neoplasm, unspecified: Secondary | ICD-10-CM

## 2016-03-17 MED ORDER — SODIUM CHLORIDE 0.9 % IJ SOLN
10.0000 mL | Freq: Once | INTRAMUSCULAR | Status: AC
  Administered 2016-03-17: 10 mL via INTRAVENOUS
  Filled 2016-03-17: qty 10

## 2016-03-17 MED ORDER — HEPARIN SOD (PORK) LOCK FLUSH 100 UNIT/ML IV SOLN
500.0000 [IU] | Freq: Once | INTRAVENOUS | Status: AC
  Administered 2016-03-17: 500 [IU] via INTRAVENOUS
  Filled 2016-03-17: qty 5

## 2016-03-28 NOTE — Progress Notes (Signed)
[place]  Telephone:(336) [phone] Fax:(336) [phone]  ID: Valerie Horton OB: 11/29/1960  MR#: [id number]  [id number]  Patient Care Team: Provider Not In System as PCP - General Clent Jacks, RN as Registered Nurse  CHIEF COMPLAINT: Peritoneal carcinomatosis, metastatic adenocarcinoma of likely lower GI primary.  INTERVAL HISTORY: Patient returns to clinic today for further evaluation and consideration of cycle 4 of 12 of FOLFOX. She has some mild nausea after treatments, but otherwise is tolerating them well. 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 Her abdominal pain and bloating have improved. She has no neurological complaints.  She denies any recent fevers or illnesses.  She has no chest pain or shortness of breath.  She denies and nausea, vomiting, constipation, or diarrhea. She has no melena or hematochezia. She has no urinary complains.  Patient offers no further specific complaints.  REVIEW OF SYSTEMS:   Review of Systems  Constitutional: Negative for fever, malaise/fatigue and weight loss.  Respiratory: Negative.  Negative for cough and shortness of breath.   Cardiovascular: Negative.  Negative for chest pain.  Gastrointestinal: Negative for abdominal pain, blood in stool, constipation, diarrhea, melena and vomiting.  Genitourinary: Negative.   Musculoskeletal: Negative.   Neurological: Positive for sensory change. Negative for weakness.  Psychiatric/Behavioral: The patient is not nervous/anxious.     As per HPI. Otherwise, a complete review of systems is negative.  PAST MEDICAL HISTORY: Past Medical History:  Diagnosis Date  . Acid reflux   . Allergic rhinitis   . Anxiety   . Arthritis    left knee  . Cancer ([place])   . Cervical spondylosis   . CTS (carpal tunnel syndrome)    Right  . Diverticulosis   . HSV-2 (herpes simplex virus 2) infection   . Hyperlipidemia   . Ovarian failure   . Rosacea   . Wears contact lenses      PAST SURGICAL HISTORY: Past Surgical History:  Procedure Laterality Date  . ANTERIOR CRUCIATE LIGAMENT REPAIR Left   . CESAREAN SECTION    . COLONOSCOPY WITH PROPOFOL N/A 01/29/2016   Procedure: COLONOSCOPY WITH PROPOFOL;  Surgeon: Lucilla Lame, MD;  Location: [place];  Service: Endoscopy;  Laterality: N/A;  . ESOPHAGOGASTRODUODENOSCOPY (EGD) WITH PROPOFOL N/A 01/29/2016   Procedure: ESOPHAGOGASTRODUODENOSCOPY (EGD) WITH PROPOFOL;  Surgeon: Lucilla Lame, MD;  Location: [place];  Service: Endoscopy;  Laterality: N/A;  . PERIPHERAL VASCULAR CATHETERIZATION N/A 02/10/2016   Procedure: Glori Luis Cath Insertion;  Surgeon: Algernon Huxley, MD;  Location: [place] [facility];  Service: Cardiovascular;  Laterality: N/A;    FAMILY HISTORY: Unknown.  Patient is adopted.     ADVANCED DIRECTIVES:    HEALTH MAINTENANCE: Social History  Substance Use Topics  . Smoking status: Former Research scientist (life sciences)  . Smokeless tobacco: Never Used     Comment: Quit in her 47's  . Alcohol use 0.0 oz/week     Comment: 1 drink/mo     Colonoscopy:  PAP:  Bone density:  Lipid panel:  No Known Allergies  Current Outpatient Prescriptions  Medication Sig Dispense Refill  . acyclovir (ZOVIRAX) 800 MG tablet Take 1 tablet (800 mg total) by mouth daily. 90 tablet 1  . escitalopram (LEXAPRO) 10 MG tablet TAKE 1 TABLET (10 MG TOTAL) BY MOUTH DAILY. 90 tablet 1  . lidocaine-prilocaine (EMLA) cream Apply to affected area once 30 g 3  . magic mouthwash SOLN Take 5 mLs by mouth 3 (three) times daily as needed for mouth  pain. 240 mL 0  . ondansetron (ZOFRAN) 8 MG tablet Take 1 tablet (8 mg total) by mouth 2 (two) times daily as needed for refractory nausea / vomiting. 30 tablet 1  . pantoprazole (PROTONIX) 40 MG tablet Take 40 mg by mouth daily.     . prochlorperazine (COMPAZINE) 10 MG tablet Take 1 tablet (10 mg total) by mouth every 6 (six) hours as needed (Nausea or vomiting). 30 tablet 1   No current  facility-administered medications for this visit.     OBJECTIVE: Vitals:   03/29/16 0933  BP: 121/68  Pulse: 81  Resp: 18  Temp: (!) 96.8 F (36 C)     Body mass index is 23.89 kg/m.    ECOG FS:0 - Asymptomatic  General: Well-developed, well-nourished, no acute distress. Eyes: Pink conjunctiva, anicteric sclera. Lungs: Clear to auscultation bilaterally. Heart: Regular rate and rhythm. No rubs, murmurs, or gallops. Abdomen: Soft, nontender, mildly distended.  Musculoskeletal: No edema, cyanosis, or clubbing. Neuro: Alert, answering all questions appropriately. Cranial nerves grossly intact. Skin: No rashes or petechiae noted. Psych: Normal affect.   LAB RESULTS:  Lab Results  Component Value Date   NA 135 03/29/2016   K 3.9 03/29/2016   CL 103 03/29/2016   CO2 25 03/29/2016   GLUCOSE 100 (H) 03/29/2016   BUN 11 03/29/2016   CREATININE 0.57 03/29/2016   CALCIUM 8.8 (L) 03/29/2016   PROT 7.3 03/29/2016   ALBUMIN 3.5 03/29/2016   AST 52 (H) 03/29/2016   ALT 40 03/29/2016   ALKPHOS 107 03/29/2016   BILITOT 0.5 03/29/2016   GFRNONAA >60 03/29/2016   GFRAA >60 03/29/2016    Lab Results  Component Value Date   WBC 3.2 (L) 03/29/2016   NEUTROABS 1.5 03/29/2016   HGB 10.6 (L) 03/29/2016   HCT 31.4 (L) 03/29/2016   MCV 79.1 (L) 03/29/2016   PLT 121 (L) 03/29/2016   Lab Results  Component Value Date   CA125 86.6 (H) 03/29/2016     STUDIES: No results found.  ASSESSMENT: Peritoneal carcinomatosis, metastatic adenocarcinoma of likely lower GI primary.  PLAN:    1. Peritoneal carcinomatosis: Pathology results reviewed, immunohistochemistry suggested lower GI primary. CT results reviewed independently and reported as above with metastatic disease throughout the abdomen. Of note, patient had a normal colonoscopy on October 16, 2014 in [place], [place]. Patient also had second opinion at [facility] that concurs with the plan. Proceed with cycle 4 of 12  of FOLFOX. Return to clinic in 2 days for pump removal and then in 2 weeks for consideration of cycle 5. Plan to reimage after 6 cycles.  2: Genetic testing: Negative. 3. Anemia: Mild, monitor. 4. Pancytopenia: Secondary chemotherapy, monitor.  Patient expressed understanding and was in agreement with this plan. She also understands that She can call clinic at any time with any questions, concerns, or complaints.   No matching staging information was found for the patient.  Lloyd Huger, MD   03/31/2016 8:55 AM

## 2016-03-29 ENCOUNTER — Inpatient Hospital Stay: Payer: BLUE CROSS/BLUE SHIELD

## 2016-03-29 ENCOUNTER — Inpatient Hospital Stay (HOSPITAL_BASED_OUTPATIENT_CLINIC_OR_DEPARTMENT_OTHER): Payer: BLUE CROSS/BLUE SHIELD | Admitting: Oncology

## 2016-03-29 VITALS — BP 121/68 | HR 81 | Temp 96.8°F | Resp 18 | Wt 136.6 lb

## 2016-03-29 DIAGNOSIS — C786 Secondary malignant neoplasm of retroperitoneum and peritoneum: Secondary | ICD-10-CM

## 2016-03-29 DIAGNOSIS — D61810 Antineoplastic chemotherapy induced pancytopenia: Secondary | ICD-10-CM

## 2016-03-29 DIAGNOSIS — K219 Gastro-esophageal reflux disease without esophagitis: Secondary | ICD-10-CM

## 2016-03-29 DIAGNOSIS — Z79899 Other long term (current) drug therapy: Secondary | ICD-10-CM

## 2016-03-29 DIAGNOSIS — M1990 Unspecified osteoarthritis, unspecified site: Secondary | ICD-10-CM

## 2016-03-29 DIAGNOSIS — Z87891 Personal history of nicotine dependence: Secondary | ICD-10-CM

## 2016-03-29 DIAGNOSIS — T451X5S Adverse effect of antineoplastic and immunosuppressive drugs, sequela: Secondary | ICD-10-CM | POA: Diagnosis not present

## 2016-03-29 DIAGNOSIS — E785 Hyperlipidemia, unspecified: Secondary | ICD-10-CM

## 2016-03-29 DIAGNOSIS — K5790 Diverticulosis of intestine, part unspecified, without perforation or abscess without bleeding: Secondary | ICD-10-CM

## 2016-03-29 DIAGNOSIS — C801 Malignant (primary) neoplasm, unspecified: Principal | ICD-10-CM

## 2016-03-29 DIAGNOSIS — G629 Polyneuropathy, unspecified: Secondary | ICD-10-CM

## 2016-03-29 DIAGNOSIS — R110 Nausea: Secondary | ICD-10-CM

## 2016-03-29 DIAGNOSIS — M1712 Unilateral primary osteoarthritis, left knee: Secondary | ICD-10-CM

## 2016-03-29 LAB — CBC WITH DIFFERENTIAL/PLATELET
Basophils Absolute: 0 10*3/uL (ref 0–0.1)
Basophils Relative: 1 %
EOS PCT: 1 %
Eosinophils Absolute: 0 10*3/uL (ref 0–0.7)
HEMATOCRIT: 31.4 % — AB (ref 35.0–47.0)
Hemoglobin: 10.6 g/dL — ABNORMAL LOW (ref 12.0–16.0)
LYMPHS ABS: 1.2 10*3/uL (ref 1.0–3.6)
LYMPHS PCT: 38 %
MCH: 26.6 pg (ref 26.0–34.0)
MCHC: 33.6 g/dL (ref 32.0–36.0)
MCV: 79.1 fL — AB (ref 80.0–100.0)
MONO ABS: 0.5 10*3/uL (ref 0.2–0.9)
MONOS PCT: 15 %
NEUTROS ABS: 1.5 10*3/uL (ref 1.4–6.5)
Neutrophils Relative %: 47 %
PLATELETS: 121 10*3/uL — AB (ref 150–440)
RBC: 3.97 MIL/uL (ref 3.80–5.20)
RDW: 20.7 % — AB (ref 11.5–14.5)
WBC: 3.2 10*3/uL — ABNORMAL LOW (ref 3.6–11.0)

## 2016-03-29 LAB — COMPREHENSIVE METABOLIC PANEL
ALT: 40 U/L (ref 14–54)
AST: 52 U/L — AB (ref 15–41)
Albumin: 3.5 g/dL (ref 3.5–5.0)
Alkaline Phosphatase: 107 U/L (ref 38–126)
Anion gap: 7 (ref 5–15)
BILIRUBIN TOTAL: 0.5 mg/dL (ref 0.3–1.2)
BUN: 11 mg/dL (ref 6–20)
CO2: 25 mmol/L (ref 22–32)
CREATININE: 0.57 mg/dL (ref 0.44–1.00)
Calcium: 8.8 mg/dL — ABNORMAL LOW (ref 8.9–10.3)
Chloride: 103 mmol/L (ref 101–111)
GFR calc Af Amer: >60 mL/min (ref >60)
GFR calc non Af Amer: >60 mL/min (ref >60)
GLUCOSE: 100 mg/dL — AB (ref 65–99)
POTASSIUM: 3.9 mmol/L (ref 3.5–5.1)
Sodium: 135 mmol/L (ref 135–145)
TOTAL PROTEIN: 7.3 g/dL (ref 6.5–8.1)

## 2016-03-29 MED ORDER — SODIUM CHLORIDE 0.9 % IJ SOLN
10.0000 mL | Freq: Once | INTRAMUSCULAR | Status: AC
  Administered 2016-03-29: 10 mL via INTRAVENOUS
  Filled 2016-03-29: qty 10

## 2016-03-29 MED ORDER — LEUCOVORIN CALCIUM INJECTION 350 MG
400.0000 mg/m2 | Freq: Once | INTRAMUSCULAR | Status: AC
  Administered 2016-03-29: 700 mg via INTRAVENOUS
  Filled 2016-03-29: qty 35

## 2016-03-29 MED ORDER — PALONOSETRON HCL INJECTION 0.25 MG/5ML
0.2500 mg | Freq: Once | INTRAVENOUS | Status: AC
  Administered 2016-03-29: 0.25 mg via INTRAVENOUS
  Filled 2016-03-29: qty 5

## 2016-03-29 MED ORDER — SODIUM CHLORIDE 0.9 % IV SOLN
10.0000 mg | Freq: Once | INTRAVENOUS | Status: AC
  Administered 2016-03-29: 10 mg via INTRAVENOUS
  Filled 2016-03-29 (×2): qty 1

## 2016-03-29 MED ORDER — FLUOROURACIL CHEMO INJECTION 2.5 GM/50ML
400.0000 mg/m2 | Freq: Once | INTRAVENOUS | Status: AC
  Administered 2016-03-29: 700 mg via INTRAVENOUS
  Filled 2016-03-29: qty 14

## 2016-03-29 MED ORDER — DEXTROSE 5 % IV SOLN
Freq: Once | INTRAVENOUS | Status: AC
  Administered 2016-03-29: 10:00:00 via INTRAVENOUS
  Filled 2016-03-29: qty 1000

## 2016-03-29 MED ORDER — HEPARIN SOD (PORK) LOCK FLUSH 100 UNIT/ML IV SOLN
500.0000 [IU] | Freq: Once | INTRAVENOUS | Status: DC
  Filled 2016-03-29: qty 5

## 2016-03-29 MED ORDER — SODIUM CHLORIDE 0.9 % IV SOLN
2400.0000 mg/m2 | INTRAVENOUS | Status: DC
  Administered 2016-03-29: 4200 mg via INTRAVENOUS
  Filled 2016-03-29: qty 84

## 2016-03-29 MED ORDER — OXALIPLATIN CHEMO INJECTION 100 MG/20ML
85.0000 mg/m2 | Freq: Once | INTRAVENOUS | Status: AC
  Administered 2016-03-29: 150 mg via INTRAVENOUS
  Filled 2016-03-29: qty 20

## 2016-03-29 NOTE — Progress Notes (Signed)
States is feeling well today. Complains of rash and itchiness to port site and pt thinks it is a mild adhesive allergy.

## 2016-03-30 LAB — CA 125: CA 125: 86.6 U/mL — AB (ref 0.0–38.1)

## 2016-03-31 ENCOUNTER — Inpatient Hospital Stay: Payer: BLUE CROSS/BLUE SHIELD

## 2016-03-31 VITALS — BP 99/65 | HR 74 | Temp 97.0°F | Resp 18

## 2016-03-31 DIAGNOSIS — C786 Secondary malignant neoplasm of retroperitoneum and peritoneum: Secondary | ICD-10-CM

## 2016-03-31 DIAGNOSIS — C801 Malignant (primary) neoplasm, unspecified: Principal | ICD-10-CM

## 2016-03-31 MED ORDER — HEPARIN SOD (PORK) LOCK FLUSH 100 UNIT/ML IV SOLN
500.0000 [IU] | Freq: Once | INTRAVENOUS | Status: AC | PRN
  Administered 2016-03-31: 500 [IU]

## 2016-03-31 MED ORDER — SODIUM CHLORIDE 0.9% FLUSH
10.0000 mL | INTRAVENOUS | Status: DC | PRN
  Administered 2016-03-31: 10 mL
  Filled 2016-03-31: qty 10

## 2016-03-31 MED ORDER — HEPARIN SOD (PORK) LOCK FLUSH 100 UNIT/ML IV SOLN
INTRAVENOUS | Status: AC
  Filled 2016-03-31: qty 5

## 2016-04-10 NOTE — Progress Notes (Signed)
[place]  Telephone:(336) [phone] Fax:(336) [phone]  ID: Valerie Horton OB: Jun 03, 1961  MR#: [id number]  [id number]  Patient Care Team: Provider Not In System as PCP - General Clent Jacks, RN as Registered Nurse  CHIEF COMPLAINT: Peritoneal carcinomatosis, metastatic adenocarcinoma of likely lower GI primary.  INTERVAL HISTORY: Patient returns to clinic today for further evaluation and consideration of cycle 5 of 12 of FOLFOX. She has some mild nausea after treatments, but otherwise is tolerating them well. 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 Her abdominal pain and bloating have improved. She has no neurological complaints.  She denies any recent fevers or illnesses.  She has no chest pain or shortness of breath.  She denies and nausea, vomiting, constipation, or diarrhea. She has no melena or hematochezia. She has no urinary complains.  Patient offers no further specific complaints.  REVIEW OF SYSTEMS:   Review of Systems  Constitutional: Negative for fever, malaise/fatigue and weight loss.  Respiratory: Negative.  Negative for cough and shortness of breath.   Cardiovascular: Negative.  Negative for chest pain.  Gastrointestinal: Negative for abdominal pain, blood in stool, constipation, diarrhea, melena and vomiting.  Genitourinary: Negative.   Musculoskeletal: Negative.   Neurological: Positive for sensory change. Negative for weakness.  Psychiatric/Behavioral: The patient is not nervous/anxious.     As per HPI. Otherwise, a complete review of systems is negative.  PAST MEDICAL HISTORY: Past Medical History:  Diagnosis Date  . Acid reflux   . Allergic rhinitis   . Anxiety   . Arthritis    left knee  . Cancer ([place])   . Cervical spondylosis   . CTS (carpal tunnel syndrome)    Right  . Diverticulosis   . HSV-2 (herpes simplex virus 2) infection   . Hyperlipidemia   . Ovarian failure   . Rosacea   . Wears contact lenses      PAST SURGICAL HISTORY: Past Surgical History:  Procedure Laterality Date  . ANTERIOR CRUCIATE LIGAMENT REPAIR Left   . CESAREAN SECTION    . COLONOSCOPY WITH PROPOFOL N/A 01/29/2016   Procedure: COLONOSCOPY WITH PROPOFOL;  Surgeon: Lucilla Lame, MD;  Location: [place];  Service: Endoscopy;  Laterality: N/A;  . ESOPHAGOGASTRODUODENOSCOPY (EGD) WITH PROPOFOL N/A 01/29/2016   Procedure: ESOPHAGOGASTRODUODENOSCOPY (EGD) WITH PROPOFOL;  Surgeon: Lucilla Lame, MD;  Location: [place];  Service: Endoscopy;  Laterality: N/A;  . PERIPHERAL VASCULAR CATHETERIZATION N/A 02/10/2016   Procedure: Glori Luis Cath Insertion;  Surgeon: Algernon Huxley, MD;  Location: [place] [facility];  Service: Cardiovascular;  Laterality: N/A;    FAMILY HISTORY: Unknown.  Patient is adopted.     ADVANCED DIRECTIVES:    HEALTH MAINTENANCE: Social History  Substance Use Topics  . Smoking status: Former Research scientist (life sciences)  . Smokeless tobacco: Never Used     Comment: Quit in her 14's  . Alcohol use 0.0 oz/week     Comment: 1 drink/mo     Colonoscopy:  PAP:  Bone density:  Lipid panel:  No Known Allergies  Current Outpatient Prescriptions  Medication Sig Dispense Refill  . acyclovir (ZOVIRAX) 800 MG tablet Take 1 tablet (800 mg total) by mouth daily. 90 tablet 1  . escitalopram (LEXAPRO) 10 MG tablet TAKE 1 TABLET (10 MG TOTAL) BY MOUTH DAILY. 90 tablet 1  . lidocaine-prilocaine (EMLA) cream Apply to affected area once 30 g 3  . magic mouthwash SOLN Take 5 mLs by mouth 3 (three) times daily as needed for mouth  pain. 240 mL 0  . ondansetron (ZOFRAN) 8 MG tablet Take 1 tablet (8 mg total) by mouth 2 (two) times daily as needed for refractory nausea / vomiting. 30 tablet 1  . pantoprazole (PROTONIX) 40 MG tablet Take 40 mg by mouth daily.     . prochlorperazine (COMPAZINE) 10 MG tablet Take 1 tablet (10 mg total) by mouth every 6 (six) hours as needed (Nausea or vomiting). 30 tablet 1   No current  facility-administered medications for this visit.    Facility-Administered Medications Ordered in Other Visits  Medication Dose Route Frequency Provider Last Rate Last Dose  . sodium chloride flush (NS) 0.9 % injection 10 mL  10 mL Intracatheter PRN Lloyd Huger, MD   10 mL at 03/31/16 1144    OBJECTIVE: Vitals:   04/12/16 0936  BP: 116/73  Pulse: 87  Resp: 18  Temp: (!) 95.9 F (35.5 C)     Body mass index is 23.62 kg/m.    ECOG FS:0 - Asymptomatic  General: Well-developed, well-nourished, no acute distress. Eyes: Pink conjunctiva, anicteric sclera. Lungs: Clear to auscultation bilaterally. Heart: Regular rate and rhythm. No rubs, murmurs, or gallops. Abdomen: Soft, nontender, mildly distended.  Musculoskeletal: No edema, cyanosis, or clubbing. Neuro: Alert, answering all questions appropriately. Cranial nerves grossly intact. Skin: No rashes or petechiae noted. Psych: Normal affect.   LAB RESULTS:  Lab Results  Component Value Date   NA 135 04/12/2016   K 3.7 04/12/2016   CL 103 04/12/2016   CO2 24 04/12/2016   GLUCOSE 104 (H) 04/12/2016   BUN 12 04/12/2016   CREATININE 0.74 04/12/2016   CALCIUM 8.8 (L) 04/12/2016   PROT 7.2 04/12/2016   ALBUMIN 3.4 (L) 04/12/2016   AST 35 04/12/2016   ALT 22 04/12/2016   ALKPHOS 112 04/12/2016   BILITOT 0.6 04/12/2016   GFRNONAA >60 04/12/2016   GFRAA >60 04/12/2016    Lab Results  Component Value Date   WBC 2.4 (L) 04/12/2016   NEUTROABS 0.7 (L) 04/12/2016   HGB 10.6 (L) 04/12/2016   HCT 31.8 (L) 04/12/2016   MCV 81.8 04/12/2016   PLT 107 (L) 04/12/2016   Lab Results  Component Value Date   CA125 86.6 (H) 03/29/2016     STUDIES: No results found.  ASSESSMENT: Peritoneal carcinomatosis, metastatic adenocarcinoma of likely lower GI primary.  PLAN:    1. Peritoneal carcinomatosis: Pathology results reviewed, immunohistochemistry suggested lower GI primary. CT results reviewed independently with  metastatic disease throughout the abdomen. Of note, patient had a normal colonoscopy on October 16, 2014 in [place], [place]. Patient also had second opinion at [facility] that concurs with the plan. Delay cycle 5 of 12 of FOLFOX secondary to neutropenia. Return to clinic in 1 week for reconsideration of treatment. We will add Neulasta with the remainder of her treatments. Plan to reimage after 6 cycles.  2: Genetic testing: Negative. 3. Anemia: Mild, monitor. 4. Thrombocytopenia: Mild, secondary to chemotherapy, monitor. 5. Neutropenia: Neulasta with the remainder treatments as above.  Patient expressed understanding and was in agreement with this plan. She also understands that She can call clinic at any time with any questions, concerns, or complaints.   No matching staging information was found for the patient.  Lloyd Huger, MD   04/17/2016 7:15 AM

## 2016-04-12 ENCOUNTER — Telehealth: Payer: Self-pay | Admitting: *Deleted

## 2016-04-12 ENCOUNTER — Inpatient Hospital Stay (HOSPITAL_BASED_OUTPATIENT_CLINIC_OR_DEPARTMENT_OTHER): Payer: BLUE CROSS/BLUE SHIELD | Admitting: Oncology

## 2016-04-12 ENCOUNTER — Inpatient Hospital Stay: Payer: BLUE CROSS/BLUE SHIELD | Attending: Oncology

## 2016-04-12 ENCOUNTER — Inpatient Hospital Stay: Payer: BLUE CROSS/BLUE SHIELD

## 2016-04-12 VITALS — BP 116/73 | HR 87 | Temp 95.9°F | Resp 18 | Wt 135.0 lb

## 2016-04-12 DIAGNOSIS — R110 Nausea: Secondary | ICD-10-CM | POA: Diagnosis not present

## 2016-04-12 DIAGNOSIS — Z9221 Personal history of antineoplastic chemotherapy: Secondary | ICD-10-CM | POA: Insufficient documentation

## 2016-04-12 DIAGNOSIS — Z87891 Personal history of nicotine dependence: Secondary | ICD-10-CM | POA: Insufficient documentation

## 2016-04-12 DIAGNOSIS — K219 Gastro-esophageal reflux disease without esophagitis: Secondary | ICD-10-CM

## 2016-04-12 DIAGNOSIS — E785 Hyperlipidemia, unspecified: Secondary | ICD-10-CM | POA: Insufficient documentation

## 2016-04-12 DIAGNOSIS — Z79899 Other long term (current) drug therapy: Secondary | ICD-10-CM | POA: Diagnosis not present

## 2016-04-12 DIAGNOSIS — C801 Malignant (primary) neoplasm, unspecified: Principal | ICD-10-CM

## 2016-04-12 DIAGNOSIS — M1990 Unspecified osteoarthritis, unspecified site: Secondary | ICD-10-CM | POA: Diagnosis not present

## 2016-04-12 DIAGNOSIS — D6959 Other secondary thrombocytopenia: Secondary | ICD-10-CM

## 2016-04-12 DIAGNOSIS — C786 Secondary malignant neoplasm of retroperitoneum and peritoneum: Secondary | ICD-10-CM

## 2016-04-12 DIAGNOSIS — T451X5S Adverse effect of antineoplastic and immunosuppressive drugs, sequela: Secondary | ICD-10-CM | POA: Insufficient documentation

## 2016-04-12 DIAGNOSIS — D701 Agranulocytosis secondary to cancer chemotherapy: Secondary | ICD-10-CM | POA: Insufficient documentation

## 2016-04-12 DIAGNOSIS — F419 Anxiety disorder, unspecified: Secondary | ICD-10-CM | POA: Insufficient documentation

## 2016-04-12 DIAGNOSIS — Z5111 Encounter for antineoplastic chemotherapy: Secondary | ICD-10-CM | POA: Insufficient documentation

## 2016-04-12 DIAGNOSIS — D649 Anemia, unspecified: Secondary | ICD-10-CM | POA: Diagnosis not present

## 2016-04-12 LAB — COMPREHENSIVE METABOLIC PANEL
ALT: 22 U/L (ref 14–54)
ANION GAP: 8 (ref 5–15)
AST: 35 U/L (ref 15–41)
Albumin: 3.4 g/dL — ABNORMAL LOW (ref 3.5–5.0)
Alkaline Phosphatase: 112 U/L (ref 38–126)
BUN: 12 mg/dL (ref 6–20)
CHLORIDE: 103 mmol/L (ref 101–111)
CO2: 24 mmol/L (ref 22–32)
CREATININE: 0.74 mg/dL (ref 0.44–1.00)
Calcium: 8.8 mg/dL — ABNORMAL LOW (ref 8.9–10.3)
Glucose, Bld: 104 mg/dL — ABNORMAL HIGH (ref 65–99)
POTASSIUM: 3.7 mmol/L (ref 3.5–5.1)
SODIUM: 135 mmol/L (ref 135–145)
Total Bilirubin: 0.6 mg/dL (ref 0.3–1.2)
Total Protein: 7.2 g/dL (ref 6.5–8.1)

## 2016-04-12 LAB — CBC WITH DIFFERENTIAL/PLATELET
Basophils Absolute: 0 10*3/uL (ref 0–0.1)
Basophils Relative: 0 %
EOS PCT: 1 %
Eosinophils Absolute: 0 10*3/uL (ref 0–0.7)
HCT: 31.8 % — ABNORMAL LOW (ref 35.0–47.0)
Hemoglobin: 10.6 g/dL — ABNORMAL LOW (ref 12.0–16.0)
LYMPHS ABS: 1.2 10*3/uL (ref 1.0–3.6)
LYMPHS PCT: 51 %
MCH: 27.2 pg (ref 26.0–34.0)
MCHC: 33.2 g/dL (ref 32.0–36.0)
MCV: 81.8 fL (ref 80.0–100.0)
MONO ABS: 0.4 10*3/uL (ref 0.2–0.9)
Monocytes Relative: 18 %
Neutro Abs: 0.7 10*3/uL — ABNORMAL LOW (ref 1.4–6.5)
Neutrophils Relative %: 30 %
PLATELETS: 107 10*3/uL — AB (ref 150–440)
RBC: 3.88 MIL/uL (ref 3.80–5.20)
RDW: 23.8 % — AB (ref 11.5–14.5)
WBC: 2.4 10*3/uL — AB (ref 3.6–11.0)

## 2016-04-12 MED ORDER — HEPARIN SOD (PORK) LOCK FLUSH 100 UNIT/ML IV SOLN
500.0000 [IU] | Freq: Once | INTRAVENOUS | Status: AC
  Administered 2016-04-12: 500 [IU] via INTRAVENOUS
  Filled 2016-04-12: qty 5

## 2016-04-12 MED ORDER — SODIUM CHLORIDE 0.9% FLUSH
10.0000 mL | INTRAVENOUS | Status: DC | PRN
  Administered 2016-04-12: 10 mL via INTRAVENOUS
  Filled 2016-04-12: qty 10

## 2016-04-12 NOTE — Progress Notes (Signed)
Questions if can stop taking protonix since current prescription is almost finished. Offers no complaints.

## 2016-04-12 NOTE — Telephone Encounter (Signed)
Asking if she needs to continue to take Protonix. Please advise, if so, she needs a refill

## 2016-04-12 NOTE — Telephone Encounter (Signed)
Per Dr Grayland Ormond, Stop Protinix for now and if sx return, call back and can refill at that time

## 2016-04-14 ENCOUNTER — Inpatient Hospital Stay: Payer: BLUE CROSS/BLUE SHIELD

## 2016-04-18 NOTE — Progress Notes (Signed)
[facility]  Telephone:(336) [phone] [phone]) [phone]  ID: Valerie Horton OB: 1960-09-29  MR#: [id number]  [id number]  Patient Care Team: Provider Not In System as PCP - General Clent Jacks, RN as Registered Nurse  CHIEF COMPLAINT: Peritoneal carcinomatosis, metastatic adenocarcinoma of likely lower GI primary.  INTERVAL HISTORY: Patient returns to clinic today for further evaluation and reconsideration of cycle 5 of 12 of FOLFOX. She has some mild nausea after treatments, but otherwise is tolerating them well. 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 Her abdominal pain and bloating have improved. She has no neurological complaints.  She denies any recent fevers or illnesses.  She has no chest pain or shortness of breath.  She denies and nausea, vomiting, constipation, or diarrhea. She has no melena or hematochezia. She has no urinary complains.  Patient offers no further specific complaints.  REVIEW OF SYSTEMS:   Review of Systems  Constitutional: Negative for fever, malaise/fatigue and weight loss.  Respiratory: Negative.  Negative for cough and shortness of breath.   Cardiovascular: Negative.  Negative for chest pain.  Gastrointestinal: Negative for abdominal pain, blood in stool, constipation, diarrhea, melena and vomiting.  Genitourinary: Negative.   Musculoskeletal: Negative.   Neurological: Positive for sensory change. Negative for weakness.  Psychiatric/Behavioral: The patient is not nervous/anxious.     As per HPI. Otherwise, a complete review of systems is negative.  PAST MEDICAL HISTORY: Past Medical History:  Diagnosis Date  . Acid reflux   . Allergic rhinitis   . Anxiety   . Arthritis    left knee  . Cancer ([place])   . Cervical spondylosis   . CTS (carpal tunnel syndrome)    Right  . Diverticulosis   . HSV-2 (herpes simplex virus 2) infection   . Hyperlipidemia   . Ovarian failure   . Rosacea   . Wears contact lenses      PAST SURGICAL HISTORY: Past Surgical History:  Procedure Laterality Date  . ANTERIOR CRUCIATE LIGAMENT REPAIR Left   . CESAREAN SECTION    . COLONOSCOPY WITH PROPOFOL N/A 01/29/2016   Procedure: COLONOSCOPY WITH PROPOFOL;  Surgeon: Lucilla Lame, MD;  Location: [facility];  Service: Endoscopy;  Laterality: N/A;  . ESOPHAGOGASTRODUODENOSCOPY (EGD) WITH PROPOFOL N/A 01/29/2016   Procedure: ESOPHAGOGASTRODUODENOSCOPY (EGD) WITH PROPOFOL;  Surgeon: Lucilla Lame, MD;  Location: [facility];  Service: Endoscopy;  Laterality: N/A;  . PERIPHERAL VASCULAR CATHETERIZATION N/A 02/10/2016   Procedure: Glori Luis Cath Insertion;  Surgeon: Algernon Huxley, MD;  Location: [facility];  Service: Cardiovascular;  Laterality: N/A;    FAMILY HISTORY: Unknown.  Patient is adopted.     ADVANCED DIRECTIVES:    HEALTH MAINTENANCE: Social History  Substance Use Topics  . Smoking status: Former Research scientist (life sciences)  . Smokeless tobacco: Never Used     Comment: Quit in her 27's  . Alcohol use 0.0 oz/week     Comment: 1 drink/mo     Colonoscopy:  PAP:  Bone density:  Lipid panel:  No Known Allergies  Current Outpatient Prescriptions  Medication Sig Dispense Refill  . acyclovir (ZOVIRAX) 800 MG tablet Take 1 tablet (800 mg total) by mouth daily. 90 tablet 1  . escitalopram (LEXAPRO) 10 MG tablet TAKE 1 TABLET (10 MG TOTAL) BY MOUTH DAILY. 90 tablet 1  . lidocaine-prilocaine (EMLA) cream Apply to affected area once 30 g 3  . magic mouthwash SOLN Take 5 mLs by mouth 3 (three) times daily as needed for mouth  pain. 240 mL 0  . ondansetron (ZOFRAN) 8 MG tablet Take 1 tablet (8 mg total) by mouth 2 (two) times daily as needed for refractory nausea / vomiting. 30 tablet 1  . prochlorperazine (COMPAZINE) 10 MG tablet Take 1 tablet (10 mg total) by mouth every 6 (six) hours as needed (Nausea or vomiting). 30 tablet 1   No current facility-administered medications for this visit.    Facility-Administered  Medications Ordered in Other Visits  Medication Dose Route Frequency Provider Last Rate Last Dose  . sodium chloride flush (NS) 0.9 % injection 10 mL  10 mL Intracatheter PRN Lloyd Huger, MD   10 mL at 03/31/16 1144    OBJECTIVE: Vitals:   04/19/16 0918  BP: 116/75  Pulse: 92  Resp: 18  Temp: 98.1 F (36.7 C)     Body mass index is 23.54 kg/m.    ECOG FS:0 - Asymptomatic  General: Well-developed, well-nourished, no acute distress. Eyes: Pink conjunctiva, anicteric sclera. Lungs: Clear to auscultation bilaterally. Heart: Regular rate and rhythm. No rubs, murmurs, or gallops. Abdomen: Soft, nontender, non-distended.  Musculoskeletal: No edema, cyanosis, or clubbing. Neuro: Alert, answering all questions appropriately. Cranial nerves grossly intact. Skin: No rashes or petechiae noted. Psych: Normal affect.   LAB RESULTS:  Lab Results  Component Value Date   NA 133 (L) 04/19/2016   K 3.9 04/19/2016   CL 102 04/19/2016   CO2 24 04/19/2016   GLUCOSE 101 (H) 04/19/2016   BUN 12 04/19/2016   CREATININE 0.69 04/19/2016   CALCIUM 8.7 (L) 04/19/2016   PROT 7.6 04/19/2016   ALBUMIN 3.5 04/19/2016   AST 31 04/19/2016   ALT 17 04/19/2016   ALKPHOS 126 04/19/2016   BILITOT 0.6 04/19/2016   GFRNONAA >60 04/19/2016   GFRAA >60 04/19/2016    Lab Results  Component Value Date   WBC 3.4 (L) 04/19/2016   NEUTROABS 1.5 04/19/2016   HGB 11.2 (L) 04/19/2016   HCT 32.9 (L) 04/19/2016   MCV 82.1 04/19/2016   PLT 235 04/19/2016   Lab Results  Component Value Date   CA125 86.6 (H) 03/29/2016     STUDIES: No results found.  ASSESSMENT: Peritoneal carcinomatosis, metastatic adenocarcinoma of likely lower GI primary.  PLAN:    1. Peritoneal carcinomatosis: Pathology results reviewed, immunohistochemistry suggested lower GI primary. CT results reviewed independently with metastatic disease throughout the abdomen. Of note, patient had a normal colonoscopy on October 16, 2014 in [place], [place]. Patient also had second opinion at [facility] that concurs with the plan. Proceed with cycle 5 of 12 of FOLFOX today. Will add Neulasta with discontinuation of pump for the remainder of the cycles. Return to clinic in 2 days for pump removal and Neulasta, in 1 week for laboratory work only, then in 2 weeks for consideration of cycle 6. Plan to reimage after 6 cycles.  2: Genetic testing: Negative. 3. Anemia: Mild, monitor. 4. Thrombocytopenia: Resolved. 5. Neutropenia: Neulasta with the remainder treatments as above.  Patient expressed understanding and was in agreement with this plan. She also understands that She can call clinic at any time with any questions, concerns, or complaints.   No matching staging information was found for the patient.  Lloyd Huger, MD   04/22/2016 12:51 PM

## 2016-04-19 ENCOUNTER — Inpatient Hospital Stay: Payer: BLUE CROSS/BLUE SHIELD

## 2016-04-19 ENCOUNTER — Inpatient Hospital Stay (HOSPITAL_BASED_OUTPATIENT_CLINIC_OR_DEPARTMENT_OTHER): Payer: BLUE CROSS/BLUE SHIELD | Admitting: Oncology

## 2016-04-19 VITALS — BP 116/75 | HR 92 | Temp 98.1°F | Resp 18 | Wt 134.6 lb

## 2016-04-19 DIAGNOSIS — C786 Secondary malignant neoplasm of retroperitoneum and peritoneum: Secondary | ICD-10-CM | POA: Diagnosis not present

## 2016-04-19 DIAGNOSIS — T451X5S Adverse effect of antineoplastic and immunosuppressive drugs, sequela: Secondary | ICD-10-CM | POA: Diagnosis not present

## 2016-04-19 DIAGNOSIS — C801 Malignant (primary) neoplasm, unspecified: Secondary | ICD-10-CM

## 2016-04-19 DIAGNOSIS — F419 Anxiety disorder, unspecified: Secondary | ICD-10-CM

## 2016-04-19 DIAGNOSIS — D701 Agranulocytosis secondary to cancer chemotherapy: Secondary | ICD-10-CM | POA: Diagnosis not present

## 2016-04-19 DIAGNOSIS — R110 Nausea: Secondary | ICD-10-CM

## 2016-04-19 DIAGNOSIS — M1990 Unspecified osteoarthritis, unspecified site: Secondary | ICD-10-CM

## 2016-04-19 DIAGNOSIS — K219 Gastro-esophageal reflux disease without esophagitis: Secondary | ICD-10-CM

## 2016-04-19 DIAGNOSIS — E785 Hyperlipidemia, unspecified: Secondary | ICD-10-CM

## 2016-04-19 DIAGNOSIS — D649 Anemia, unspecified: Secondary | ICD-10-CM

## 2016-04-19 DIAGNOSIS — Z87891 Personal history of nicotine dependence: Secondary | ICD-10-CM

## 2016-04-19 DIAGNOSIS — Z79899 Other long term (current) drug therapy: Secondary | ICD-10-CM

## 2016-04-19 LAB — COMPREHENSIVE METABOLIC PANEL
ALT: 17 U/L (ref 14–54)
ANION GAP: 7 (ref 5–15)
AST: 31 U/L (ref 15–41)
Albumin: 3.5 g/dL (ref 3.5–5.0)
Alkaline Phosphatase: 126 U/L (ref 38–126)
BILIRUBIN TOTAL: 0.6 mg/dL (ref 0.3–1.2)
BUN: 12 mg/dL (ref 6–20)
CO2: 24 mmol/L (ref 22–32)
Calcium: 8.7 mg/dL — ABNORMAL LOW (ref 8.9–10.3)
Chloride: 102 mmol/L (ref 101–111)
Creatinine, Ser: 0.69 mg/dL (ref 0.44–1.00)
Glucose, Bld: 101 mg/dL — ABNORMAL HIGH (ref 65–99)
POTASSIUM: 3.9 mmol/L (ref 3.5–5.1)
Sodium: 133 mmol/L — ABNORMAL LOW (ref 135–145)
TOTAL PROTEIN: 7.6 g/dL (ref 6.5–8.1)

## 2016-04-19 LAB — CBC WITH DIFFERENTIAL/PLATELET
Basophils Absolute: 0 10*3/uL (ref 0–0.1)
Basophils Relative: 1 %
EOS PCT: 2 %
Eosinophils Absolute: 0.1 10*3/uL (ref 0–0.7)
HEMATOCRIT: 32.9 % — AB (ref 35.0–47.0)
Hemoglobin: 11.2 g/dL — ABNORMAL LOW (ref 12.0–16.0)
LYMPHS PCT: 35 %
Lymphs Abs: 1.2 10*3/uL (ref 1.0–3.6)
MCH: 28 pg (ref 26.0–34.0)
MCHC: 34.1 g/dL (ref 32.0–36.0)
MCV: 82.1 fL (ref 80.0–100.0)
MONO ABS: 0.7 10*3/uL (ref 0.2–0.9)
Monocytes Relative: 20 %
NEUTROS ABS: 1.5 10*3/uL (ref 1.4–6.5)
Neutrophils Relative %: 42 %
PLATELETS: 235 10*3/uL (ref 150–440)
RBC: 4.01 MIL/uL (ref 3.80–5.20)
RDW: 24.5 % — AB (ref 11.5–14.5)
WBC: 3.4 10*3/uL — ABNORMAL LOW (ref 3.6–11.0)

## 2016-04-19 MED ORDER — HEPARIN SOD (PORK) LOCK FLUSH 100 UNIT/ML IV SOLN
500.0000 [IU] | Freq: Once | INTRAVENOUS | Status: DC
  Filled 2016-04-19: qty 5

## 2016-04-19 MED ORDER — SODIUM CHLORIDE 0.9 % IV SOLN: 10.0000 mg | Freq: Once | INTRAVENOUS | Status: DC

## 2016-04-19 MED ORDER — LEUCOVORIN CALCIUM INJECTION 350 MG
400.0000 mg/m2 | Freq: Once | INTRAVENOUS | Status: AC
  Administered 2016-04-19: 700 mg via INTRAVENOUS
  Filled 2016-04-19: qty 35

## 2016-04-19 MED ORDER — FLUOROURACIL CHEMO INJECTION 2.5 GM/50ML
400.0000 mg/m2 | Freq: Once | INTRAVENOUS | Status: AC
  Administered 2016-04-19: 700 mg via INTRAVENOUS
  Filled 2016-04-19: qty 14

## 2016-04-19 MED ORDER — SODIUM CHLORIDE 0.9 % IJ SOLN
10.0000 mL | Freq: Once | INTRAMUSCULAR | Status: AC
  Administered 2016-04-19: 10 mL via INTRAVENOUS
  Filled 2016-04-19: qty 10

## 2016-04-19 MED ORDER — PALONOSETRON HCL INJECTION 0.25 MG/5ML
0.2500 mg | Freq: Once | INTRAVENOUS | Status: AC
  Administered 2016-04-19: 0.25 mg via INTRAVENOUS
  Filled 2016-04-19: qty 5

## 2016-04-19 MED ORDER — DEXAMETHASONE SODIUM PHOSPHATE 10 MG/ML IJ SOLN
10.0000 mg | Freq: Once | INTRAMUSCULAR | Status: AC
  Administered 2016-04-19: 10 mg via INTRAVENOUS
  Filled 2016-04-19: qty 1

## 2016-04-19 MED ORDER — SODIUM CHLORIDE 0.9 % IV SOLN
2400.0000 mg/m2 | INTRAVENOUS | Status: DC
  Administered 2016-04-19: 4200 mg via INTRAVENOUS
  Filled 2016-04-19: qty 84

## 2016-04-19 MED ORDER — OXALIPLATIN CHEMO INJECTION 100 MG/20ML
85.0000 mg/m2 | Freq: Once | INTRAVENOUS | Status: AC
  Administered 2016-04-19: 150 mg via INTRAVENOUS
  Filled 2016-04-19: qty 30

## 2016-04-19 MED ORDER — DEXTROSE 5 % IV SOLN
Freq: Once | INTRAVENOUS | Status: AC
  Administered 2016-04-19: 10:00:00 via INTRAVENOUS
  Filled 2016-04-19: qty 1000

## 2016-04-19 NOTE — Progress Notes (Signed)
Has occasional cramping. Stopped protonix and has not had any episodes of acid reflux at this time.

## 2016-04-21 ENCOUNTER — Inpatient Hospital Stay: Payer: BLUE CROSS/BLUE SHIELD

## 2016-04-21 VITALS — BP 108/71 | HR 72 | Temp 96.0°F | Resp 20

## 2016-04-21 DIAGNOSIS — C786 Secondary malignant neoplasm of retroperitoneum and peritoneum: Secondary | ICD-10-CM

## 2016-04-21 DIAGNOSIS — C801 Malignant (primary) neoplasm, unspecified: Principal | ICD-10-CM

## 2016-04-21 MED ORDER — HEPARIN SOD (PORK) LOCK FLUSH 100 UNIT/ML IV SOLN
500.0000 [IU] | Freq: Once | INTRAVENOUS | Status: AC | PRN
Start: 2016-04-21 — End: 2016-04-21
  Administered 2016-04-21: 500 [IU]
  Filled 2016-04-21: qty 5

## 2016-04-21 MED ORDER — PEGFILGRASTIM INJECTION 6 MG/0.6ML ~~LOC~~
6.0000 mg | PREFILLED_SYRINGE | Freq: Once | SUBCUTANEOUS | Status: AC
  Administered 2016-04-21: 6 mg via SUBCUTANEOUS
  Filled 2016-04-21: qty 0.6

## 2016-04-21 MED ORDER — SODIUM CHLORIDE 0.9% FLUSH
10.0000 mL | INTRAVENOUS | Status: DC | PRN
  Administered 2016-04-21: 10 mL
  Filled 2016-04-21: qty 10

## 2016-04-26 ENCOUNTER — Other Ambulatory Visit: Payer: Self-pay

## 2016-04-26 ENCOUNTER — Inpatient Hospital Stay: Payer: BLUE CROSS/BLUE SHIELD

## 2016-04-26 DIAGNOSIS — C801 Malignant (primary) neoplasm, unspecified: Principal | ICD-10-CM

## 2016-04-26 DIAGNOSIS — C786 Secondary malignant neoplasm of retroperitoneum and peritoneum: Secondary | ICD-10-CM

## 2016-04-26 LAB — COMPREHENSIVE METABOLIC PANEL
ALBUMIN: 3.5 g/dL (ref 3.5–5.0)
ALK PHOS: 149 U/L — AB (ref 38–126)
ALT: 13 U/L — AB (ref 14–54)
AST: 23 U/L (ref 15–41)
Anion gap: 9 (ref 5–15)
BILIRUBIN TOTAL: 0.5 mg/dL (ref 0.3–1.2)
BUN: 11 mg/dL (ref 6–20)
CALCIUM: 8.7 mg/dL — AB (ref 8.9–10.3)
CO2: 21 mmol/L — AB (ref 22–32)
Chloride: 102 mmol/L (ref 101–111)
Creatinine, Ser: 0.6 mg/dL (ref 0.44–1.00)
GFR calc Af Amer: >60 mL/min (ref >60)
GFR calc non Af Amer: >60 mL/min (ref >60)
GLUCOSE: 102 mg/dL — AB (ref 65–99)
Potassium: 3.9 mmol/L (ref 3.5–5.1)
SODIUM: 132 mmol/L — AB (ref 135–145)
TOTAL PROTEIN: 7.2 g/dL (ref 6.5–8.1)

## 2016-04-26 LAB — CBC WITH DIFFERENTIAL/PLATELET
BASOS ABS: 0 10*3/uL (ref 0–0.1)
BASOS PCT: 0 %
EOS ABS: 0 10*3/uL (ref 0–0.7)
Eosinophils Relative: 0 %
HEMATOCRIT: 32.4 % — AB (ref 35.0–47.0)
HEMOGLOBIN: 10.9 g/dL — AB (ref 12.0–16.0)
Lymphocytes Relative: 20 %
Lymphs Abs: 1.4 10*3/uL (ref 1.0–3.6)
MCH: 28.1 pg (ref 26.0–34.0)
MCHC: 33.8 g/dL (ref 32.0–36.0)
MCV: 83 fL (ref 80.0–100.0)
MONOS PCT: 13 %
Monocytes Absolute: 0.9 10*3/uL (ref 0.2–0.9)
NEUTROS ABS: 4.7 10*3/uL (ref 1.4–6.5)
NEUTROS PCT: 67 %
Platelets: 79 10*3/uL — ABNORMAL LOW (ref 150–440)
RBC: 3.9 MIL/uL (ref 3.80–5.20)
RDW: 23.4 % — ABNORMAL HIGH (ref 11.5–14.5)
WBC: 7.1 10*3/uL (ref 3.6–11.0)

## 2016-05-02 NOTE — Progress Notes (Signed)
[facility]  [phone]) [phone] [phone]) [phone]  ID: Valerie Horton OB: May 21, 1961  MR#: [id number]  [id number]  Patient Care Team: Provider Not In System as PCP - General Clent Jacks, RN as Registered Nurse  CHIEF COMPLAINT: Peritoneal carcinomatosis, metastatic adenocarcinoma of likely lower GI primary.  INTERVAL HISTORY: Patient returns to clinic today for further evaluation and consideration of cycle 6 of 12 of FOLFOX. She has some mild nausea after treatments, but otherwise is tolerating them well. 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 Her abdominal pain and bloating have improved. She has no neurological complaints.  She denies any recent fevers or illnesses.  She has no chest pain or shortness of breath.  She denies and nausea, vomiting, constipation, or diarrhea. She has no melena or hematochezia. She has no urinary complains.  Patient offers no further specific complaints.  REVIEW OF SYSTEMS:   Review of Systems  Constitutional: Negative for fever, malaise/fatigue and weight loss.  Respiratory: Negative.  Negative for cough and shortness of breath.   Cardiovascular: Negative.  Negative for chest pain.  Gastrointestinal: Negative for abdominal pain, blood in stool, constipation, diarrhea, melena and vomiting.  Genitourinary: Negative.   Musculoskeletal: Negative.   Neurological: Positive for sensory change. Negative for weakness.  Psychiatric/Behavioral: The patient is not nervous/anxious.     As per HPI. Otherwise, a complete review of systems is negative.  PAST MEDICAL HISTORY: Past Medical History:  Diagnosis Date  . Acid reflux   . Allergic rhinitis   . Anxiety   . Arthritis    left knee  . Cancer ([place])   . Cervical spondylosis   . CTS (carpal tunnel syndrome)    Right  . Diverticulosis   . HSV-2 (herpes simplex virus 2) infection   . Hyperlipidemia   . Ovarian failure   . Rosacea   . Wears contact lenses      PAST SURGICAL HISTORY: Past Surgical History:  Procedure Laterality Date  . ANTERIOR CRUCIATE LIGAMENT REPAIR Left   . CESAREAN SECTION    . COLONOSCOPY WITH PROPOFOL N/A 01/29/2016   Procedure: COLONOSCOPY WITH PROPOFOL;  Surgeon: Lucilla Lame, MD;  Location: [facility];  Service: Endoscopy;  Laterality: N/A;  . ESOPHAGOGASTRODUODENOSCOPY (EGD) WITH PROPOFOL N/A 01/29/2016   Procedure: ESOPHAGOGASTRODUODENOSCOPY (EGD) WITH PROPOFOL;  Surgeon: Lucilla Lame, MD;  Location: [facility];  Service: Endoscopy;  Laterality: N/A;  . PERIPHERAL VASCULAR CATHETERIZATION N/A 02/10/2016   Procedure: Glori Luis Cath Insertion;  Surgeon: Algernon Huxley, MD;  Location: [facility];  Service: Cardiovascular;  Laterality: N/A;    FAMILY HISTORY: Unknown.  Patient is adopted.     ADVANCED DIRECTIVES:    HEALTH MAINTENANCE: Social History  Substance Use Topics  . Smoking status: Former Research scientist (life sciences)  . Smokeless tobacco: Never Used     Comment: Quit in her 30's  . Alcohol use 0.0 oz/week     Comment: 1 drink/mo     Colonoscopy:  PAP:  Bone density:  Lipid panel:  No Known Allergies  Current Outpatient Prescriptions  Medication Sig Dispense Refill  . acyclovir (ZOVIRAX) 800 MG tablet Take 1 tablet (800 mg total) by mouth daily. 90 tablet 1  . escitalopram (LEXAPRO) 10 MG tablet TAKE 1 TABLET (10 MG TOTAL) BY MOUTH DAILY. 90 tablet 1  . lidocaine-prilocaine (EMLA) cream Apply to affected area once 30 g 3  . magic mouthwash SOLN Take 5 mLs by mouth 3 (three) times daily as needed for mouth  pain. 240 mL 0  . ondansetron (ZOFRAN) 8 MG tablet Take 1 tablet (8 mg total) by mouth 2 (two) times daily as needed for refractory nausea / vomiting. 30 tablet 1  . prochlorperazine (COMPAZINE) 10 MG tablet Take 1 tablet (10 mg total) by mouth every 6 (six) hours as needed (Nausea or vomiting). 30 tablet 1   No current facility-administered medications for this visit.    Facility-Administered  Medications Ordered in Other Visits  Medication Dose Route Frequency Provider Last Rate Last Dose  . fluorouracil (ADRUCIL) 4,200 mg in sodium chloride 0.9 % 66 mL chemo infusion  2,400 mg/m2 (Treatment Plan Recorded) Intravenous 1 day or 1 dose Lloyd Huger, MD      . fluorouracil (ADRUCIL) chemo injection 700 mg  400 mg/m2 (Treatment Plan Recorded) Intravenous Once Lloyd Huger, MD      . leucovorin 700 mg in dextrose 5 % 250 mL infusion  400 mg/m2 (Treatment Plan Recorded) Intravenous Once Lloyd Huger, MD 143 mL/hr at 05/03/16 1128 700 mg at 05/03/16 1128  . oxaliplatin (ELOXATIN) 150 mg in dextrose 5 % 500 mL chemo infusion  85 mg/m2 (Treatment Plan Recorded) Intravenous Once Lloyd Huger, MD 265 mL/hr at 05/03/16 1128 150 mg at 05/03/16 1128  . sodium chloride flush (NS) 0.9 % injection 10 mL  10 mL Intracatheter PRN Lloyd Huger, MD   10 mL at 03/31/16 1144  . sodium chloride flush (NS) 0.9 % injection 10 mL  10 mL Intracatheter PRN Lloyd Huger, MD   10 mL at 05/03/16 0923    OBJECTIVE: Vitals:   05/03/16 0944  BP: 111/74  Pulse: 85  Resp: 18  Temp: 98.9 F (37.2 C)     Body mass index is 23.37 kg/m.    ECOG FS:0 - Asymptomatic  General: Well-developed, well-nourished, no acute distress. Eyes: Pink conjunctiva, anicteric sclera. Lungs: Clear to auscultation bilaterally. Heart: Regular rate and rhythm. No rubs, murmurs, or gallops. Abdomen: Soft, nontender, non-distended.  Musculoskeletal: No edema, cyanosis, or clubbing. Neuro: Alert, answering all questions appropriately. Cranial nerves grossly intact. Skin: No rashes or petechiae noted. Psych: Normal affect.   LAB RESULTS:  Lab Results  Component Value Date   NA 135 05/03/2016   K 3.9 05/03/2016   CL 104 05/03/2016   CO2 24 05/03/2016   GLUCOSE 106 (H) 05/03/2016   BUN 11 05/03/2016   CREATININE 0.73 05/03/2016   CALCIUM 8.8 (L) 05/03/2016   PROT 7.4 05/03/2016   ALBUMIN 3.4  (L) 05/03/2016   AST 34 05/03/2016   ALT 20 05/03/2016   ALKPHOS 152 (H) 05/03/2016   BILITOT 0.5 05/03/2016   GFRNONAA >60 05/03/2016   GFRAA >60 05/03/2016    Lab Results  Component Value Date   WBC 11.7 (H) 05/03/2016   NEUTROABS 8.7 (H) 05/03/2016   HGB 10.8 (L) 05/03/2016   HCT 32.5 (L) 05/03/2016   MCV 84.1 05/03/2016   PLT 144 (L) 05/03/2016   Lab Results  Component Value Date   CA125 86.6 (H) 03/29/2016     STUDIES: No results found.  ASSESSMENT: Peritoneal carcinomatosis, metastatic adenocarcinoma of likely lower GI primary.  PLAN:    1. Peritoneal carcinomatosis: Pathology results reviewed, immunohistochemistry suggested lower GI primary. CT results reviewed independently with metastatic disease throughout the abdomen. Of note, patient had a normal colonoscopy on October 16, 2014 in [place], [place]. Patient also had second opinion at [facility] that concurs with the plan. Proceed with cycle  6 of 12 of FOLFOX today. Will add Neulasta with discontinuation of pump for the remainder of the cycles. Return to clinic in 2 days for pump removal and Neulasta, then in 2 weeks for consideration of cycle 6. Plan to reimage after In 2 weeks prior to cycle 6. 2: Genetic testing: Negative. 3. Anemia: Mild, monitor. 4. Thrombocytopenia: Resolved. 5. Neutropenia: Neulasta with the remainder treatments as above.  Patient expressed understanding and was in agreement with this plan. She also understands that She can call clinic at any time with any questions, concerns, or complaints.   No matching staging information was found for the patient.  Lloyd Huger, MD   05/03/2016 1:07 PM

## 2016-05-03 ENCOUNTER — Inpatient Hospital Stay: Payer: BLUE CROSS/BLUE SHIELD

## 2016-05-03 ENCOUNTER — Inpatient Hospital Stay (HOSPITAL_BASED_OUTPATIENT_CLINIC_OR_DEPARTMENT_OTHER): Payer: BLUE CROSS/BLUE SHIELD

## 2016-05-03 ENCOUNTER — Inpatient Hospital Stay (HOSPITAL_BASED_OUTPATIENT_CLINIC_OR_DEPARTMENT_OTHER): Payer: BLUE CROSS/BLUE SHIELD | Admitting: Oncology

## 2016-05-03 VITALS — BP 111/74 | HR 85 | Temp 98.9°F | Resp 18 | Wt 133.6 lb

## 2016-05-03 DIAGNOSIS — D6959 Other secondary thrombocytopenia: Secondary | ICD-10-CM

## 2016-05-03 DIAGNOSIS — E785 Hyperlipidemia, unspecified: Secondary | ICD-10-CM

## 2016-05-03 DIAGNOSIS — Z9221 Personal history of antineoplastic chemotherapy: Secondary | ICD-10-CM

## 2016-05-03 DIAGNOSIS — C801 Malignant (primary) neoplasm, unspecified: Secondary | ICD-10-CM

## 2016-05-03 DIAGNOSIS — D649 Anemia, unspecified: Secondary | ICD-10-CM

## 2016-05-03 DIAGNOSIS — Z87891 Personal history of nicotine dependence: Secondary | ICD-10-CM

## 2016-05-03 DIAGNOSIS — C786 Secondary malignant neoplasm of retroperitoneum and peritoneum: Secondary | ICD-10-CM | POA: Diagnosis not present

## 2016-05-03 DIAGNOSIS — M1990 Unspecified osteoarthritis, unspecified site: Secondary | ICD-10-CM

## 2016-05-03 DIAGNOSIS — Z79899 Other long term (current) drug therapy: Secondary | ICD-10-CM

## 2016-05-03 DIAGNOSIS — T451X5S Adverse effect of antineoplastic and immunosuppressive drugs, sequela: Secondary | ICD-10-CM

## 2016-05-03 DIAGNOSIS — D701 Agranulocytosis secondary to cancer chemotherapy: Secondary | ICD-10-CM

## 2016-05-03 DIAGNOSIS — K219 Gastro-esophageal reflux disease without esophagitis: Secondary | ICD-10-CM

## 2016-05-03 DIAGNOSIS — F419 Anxiety disorder, unspecified: Secondary | ICD-10-CM

## 2016-05-03 DIAGNOSIS — R110 Nausea: Secondary | ICD-10-CM

## 2016-05-03 LAB — COMPREHENSIVE METABOLIC PANEL
ALBUMIN: 3.4 g/dL — AB (ref 3.5–5.0)
ALK PHOS: 152 U/L — AB (ref 38–126)
ALT: 20 U/L (ref 14–54)
ANION GAP: 7 (ref 5–15)
AST: 34 U/L (ref 15–41)
BUN: 11 mg/dL (ref 6–20)
CALCIUM: 8.8 mg/dL — AB (ref 8.9–10.3)
CO2: 24 mmol/L (ref 22–32)
Chloride: 104 mmol/L (ref 101–111)
Creatinine, Ser: 0.73 mg/dL (ref 0.44–1.00)
GFR calc Af Amer: >60 mL/min (ref >60)
GFR calc non Af Amer: >60 mL/min (ref >60)
GLUCOSE: 106 mg/dL — AB (ref 65–99)
Potassium: 3.9 mmol/L (ref 3.5–5.1)
SODIUM: 135 mmol/L (ref 135–145)
Total Bilirubin: 0.5 mg/dL (ref 0.3–1.2)
Total Protein: 7.4 g/dL (ref 6.5–8.1)

## 2016-05-03 LAB — CBC WITH DIFFERENTIAL/PLATELET
BASOS ABS: 0.1 10*3/uL (ref 0–0.1)
BASOS PCT: 1 %
EOS ABS: 0 10*3/uL (ref 0–0.7)
Eosinophils Relative: 0 %
HCT: 32.5 % — ABNORMAL LOW (ref 35.0–47.0)
HEMOGLOBIN: 10.8 g/dL — AB (ref 12.0–16.0)
Lymphocytes Relative: 15 %
Lymphs Abs: 1.7 10*3/uL (ref 1.0–3.6)
MCH: 28 pg (ref 26.0–34.0)
MCHC: 33.3 g/dL (ref 32.0–36.0)
MCV: 84.1 fL (ref 80.0–100.0)
Monocytes Absolute: 1.1 10*3/uL — ABNORMAL HIGH (ref 0.2–0.9)
Monocytes Relative: 10 %
NEUTROS PCT: 74 %
Neutro Abs: 8.7 10*3/uL — ABNORMAL HIGH (ref 1.4–6.5)
Platelets: 144 10*3/uL — ABNORMAL LOW (ref 150–440)
RBC: 3.87 MIL/uL (ref 3.80–5.20)
RDW: 25 % — ABNORMAL HIGH (ref 11.5–14.5)
WBC: 11.7 10*3/uL — AB (ref 3.6–11.0)

## 2016-05-03 MED ORDER — FLUOROURACIL CHEMO INJECTION 5 GM/100ML
2400.0000 mg/m2 | INTRAVENOUS | Status: DC
  Administered 2016-05-03: 4200 mg via INTRAVENOUS
  Filled 2016-05-03: qty 84

## 2016-05-03 MED ORDER — PALONOSETRON HCL INJECTION 0.25 MG/5ML
0.2500 mg | Freq: Once | INTRAVENOUS | Status: AC
  Administered 2016-05-03: 0.25 mg via INTRAVENOUS
  Filled 2016-05-03: qty 5

## 2016-05-03 MED ORDER — LEUCOVORIN CALCIUM INJECTION 350 MG
400.0000 mg/m2 | Freq: Once | INTRAVENOUS | Status: AC
  Administered 2016-05-03: 700 mg via INTRAVENOUS
  Filled 2016-05-03: qty 35

## 2016-05-03 MED ORDER — DEXAMETHASONE SODIUM PHOSPHATE 10 MG/ML IJ SOLN
10.0000 mg | Freq: Once | INTRAMUSCULAR | Status: AC
  Administered 2016-05-03: 10 mg via INTRAVENOUS
  Filled 2016-05-03: qty 1

## 2016-05-03 MED ORDER — SODIUM CHLORIDE 0.9% FLUSH
10.0000 mL | INTRAVENOUS | Status: DC | PRN
  Administered 2016-05-03: 10 mL
  Filled 2016-05-03: qty 10

## 2016-05-03 MED ORDER — DEXTROSE 5 % IV SOLN
Freq: Once | INTRAVENOUS | Status: AC
  Administered 2016-05-03: 11:00:00 via INTRAVENOUS
  Filled 2016-05-03: qty 1000

## 2016-05-03 MED ORDER — FLUOROURACIL CHEMO INJECTION 2.5 GM/50ML
400.0000 mg/m2 | Freq: Once | INTRAVENOUS | Status: AC
  Administered 2016-05-03: 700 mg via INTRAVENOUS
  Filled 2016-05-03: qty 14

## 2016-05-03 MED ORDER — SODIUM CHLORIDE 0.9 % IV SOLN: 10.0000 mg | Freq: Once | INTRAVENOUS | Status: DC

## 2016-05-03 MED ORDER — OXALIPLATIN CHEMO INJECTION 100 MG/20ML
85.0000 mg/m2 | Freq: Once | INTRAVENOUS | Status: AC
  Administered 2016-05-03: 150 mg via INTRAVENOUS
  Filled 2016-05-03: qty 20

## 2016-05-03 NOTE — Progress Notes (Signed)
States has developed acid reflux since last visit and requests refill of protonix. Has intermittent abd tenderness.

## 2016-05-04 ENCOUNTER — Telehealth: Payer: Self-pay | Admitting: *Deleted

## 2016-05-04 MED ORDER — PANTOPRAZOLE SODIUM 40 MG PO TBEC: 40.0000 mg | DELAYED_RELEASE_TABLET | Freq: Every day | ORAL | 2 refills | Status: DC

## 2016-05-04 NOTE — Telephone Encounter (Signed)
Called to states she was going to get Protonix rx sent in yesterday, but pharmacy. Per Dr Grayland Ormond OK for her to have Protonix 40 mg daily #30 +2 rf. E Scribed

## 2016-05-05 ENCOUNTER — Telehealth: Payer: Self-pay | Admitting: *Deleted

## 2016-05-05 ENCOUNTER — Inpatient Hospital Stay: Payer: BLUE CROSS/BLUE SHIELD | Attending: Oncology

## 2016-05-05 ENCOUNTER — Inpatient Hospital Stay: Payer: BLUE CROSS/BLUE SHIELD

## 2016-05-05 VITALS — BP 107/71 | HR 87 | Temp 97.8°F | Resp 18

## 2016-05-05 DIAGNOSIS — Z452 Encounter for adjustment and management of vascular access device: Secondary | ICD-10-CM | POA: Diagnosis not present

## 2016-05-05 DIAGNOSIS — K219 Gastro-esophageal reflux disease without esophagitis: Secondary | ICD-10-CM | POA: Insufficient documentation

## 2016-05-05 DIAGNOSIS — Z87891 Personal history of nicotine dependence: Secondary | ICD-10-CM | POA: Insufficient documentation

## 2016-05-05 DIAGNOSIS — T451X5S Adverse effect of antineoplastic and immunosuppressive drugs, sequela: Secondary | ICD-10-CM | POA: Diagnosis not present

## 2016-05-05 DIAGNOSIS — R110 Nausea: Secondary | ICD-10-CM | POA: Insufficient documentation

## 2016-05-05 DIAGNOSIS — Z5111 Encounter for antineoplastic chemotherapy: Secondary | ICD-10-CM | POA: Insufficient documentation

## 2016-05-05 DIAGNOSIS — C786 Secondary malignant neoplasm of retroperitoneum and peritoneum: Secondary | ICD-10-CM | POA: Diagnosis not present

## 2016-05-05 DIAGNOSIS — R140 Abdominal distension (gaseous): Secondary | ICD-10-CM | POA: Diagnosis not present

## 2016-05-05 DIAGNOSIS — E785 Hyperlipidemia, unspecified: Secondary | ICD-10-CM | POA: Insufficient documentation

## 2016-05-05 DIAGNOSIS — G629 Polyneuropathy, unspecified: Secondary | ICD-10-CM | POA: Diagnosis not present

## 2016-05-05 DIAGNOSIS — D701 Agranulocytosis secondary to cancer chemotherapy: Secondary | ICD-10-CM | POA: Insufficient documentation

## 2016-05-05 DIAGNOSIS — R109 Unspecified abdominal pain: Secondary | ICD-10-CM | POA: Insufficient documentation

## 2016-05-05 DIAGNOSIS — M1712 Unilateral primary osteoarthritis, left knee: Secondary | ICD-10-CM | POA: Insufficient documentation

## 2016-05-05 DIAGNOSIS — Z79899 Other long term (current) drug therapy: Secondary | ICD-10-CM | POA: Diagnosis not present

## 2016-05-05 DIAGNOSIS — C801 Malignant (primary) neoplasm, unspecified: Secondary | ICD-10-CM | POA: Insufficient documentation

## 2016-05-05 DIAGNOSIS — D649 Anemia, unspecified: Secondary | ICD-10-CM | POA: Diagnosis not present

## 2016-05-05 MED ORDER — PEGFILGRASTIM INJECTION 6 MG/0.6ML ~~LOC~~
6.0000 mg | PREFILLED_SYRINGE | Freq: Once | SUBCUTANEOUS | Status: AC
  Administered 2016-05-05: 6 mg via SUBCUTANEOUS
  Filled 2016-05-05: qty 0.6

## 2016-05-05 MED ORDER — HEPARIN SOD (PORK) LOCK FLUSH 100 UNIT/ML IV SOLN
500.0000 [IU] | Freq: Once | INTRAVENOUS | Status: AC | PRN
  Administered 2016-05-05: 500 [IU]
  Filled 2016-05-05: qty 5

## 2016-05-05 MED ORDER — SODIUM CHLORIDE 0.9% FLUSH
10.0000 mL | INTRAVENOUS | Status: DC | PRN
  Administered 2016-05-05: 10 mL
  Filled 2016-05-05: qty 10

## 2016-05-05 NOTE — Telephone Encounter (Signed)
Patient came in today for pump d/c and reports worsening neuropathy since yesterday. Patient states yesterday neuropathy was interfering with daily activities, she could not start her car. Patient advised to monitor symptoms and call back if symptoms persist or worsen. Patient also advised worsening neuropathy would be addressed at her next clinic visit.

## 2016-05-05 NOTE — Telephone Encounter (Signed)
Agreed.  Thank you

## 2016-05-09 ENCOUNTER — Telehealth: Payer: Self-pay

## 2016-05-09 NOTE — Telephone Encounter (Signed)
  Oncology Nurse Navigator Documentation Received call from Valerie Horton stating that when she picked up her contrast for her CT it reminded her that she is unable to tolerate contrast without vomiting it back up. It also causes her to have diarrhea immediately after drinking. She is asking if there is an alternative or if it will pose a problem not drinking all of the contrast. Please advise.   Navigator Location: [facility] (05/09/16 1300)   )Navigator Encounter Type: Telephone (05/09/16 1300) Telephone: Incoming Call (05/09/16 1300)                                                  Time Spent with Patient: 15 (05/09/16 1300)

## 2016-05-09 NOTE — Telephone Encounter (Signed)
We could do it non-contrast, but would not be as good a study.

## 2016-05-10 ENCOUNTER — Ambulatory Visit: Payer: BLUE CROSS/BLUE SHIELD | Admitting: Oncology

## 2016-05-10 ENCOUNTER — Other Ambulatory Visit: Payer: BLUE CROSS/BLUE SHIELD

## 2016-05-10 ENCOUNTER — Ambulatory Visit: Payer: BLUE CROSS/BLUE SHIELD

## 2016-05-10 NOTE — Telephone Encounter (Signed)
That's fine.  Thank you.

## 2016-05-10 NOTE — Telephone Encounter (Signed)
Spoke with [place] in [place]. She has a different option for contrast that it tolerated better. She can also take imodium before the starts contrast to prevent diarrhea. Notified Valerie Horton and she will come to pick up new contrast.

## 2016-05-16 ENCOUNTER — Inpatient Hospital Stay: Payer: BLUE CROSS/BLUE SHIELD

## 2016-05-16 ENCOUNTER — Ambulatory Visit
Admission: RE | Admit: 2016-05-16 | Discharge: 2016-05-16 | Disposition: A | Discharge status: Home / Self Care | Payer: BLUE CROSS/BLUE SHIELD | Source: Ambulatory Visit | Attending: Oncology | Admitting: Oncology

## 2016-05-16 ENCOUNTER — Ambulatory Visit: Admission: RE | Admit: 2016-05-16 | Payer: BLUE CROSS/BLUE SHIELD | Source: Ambulatory Visit

## 2016-05-16 DIAGNOSIS — C801 Malignant (primary) neoplasm, unspecified: Secondary | ICD-10-CM | POA: Diagnosis present

## 2016-05-16 DIAGNOSIS — C786 Secondary malignant neoplasm of retroperitoneum and peritoneum: Secondary | ICD-10-CM | POA: Insufficient documentation

## 2016-05-16 DIAGNOSIS — J9811 Atelectasis: Secondary | ICD-10-CM | POA: Diagnosis not present

## 2016-05-16 DIAGNOSIS — J90 Pleural effusion, not elsewhere classified: Secondary | ICD-10-CM | POA: Diagnosis not present

## 2016-05-16 LAB — COMPREHENSIVE METABOLIC PANEL
ALK PHOS: 157 U/L — AB (ref 38–126)
ALT: 17 U/L (ref 14–54)
AST: 31 U/L (ref 15–41)
Albumin: 3.3 g/dL — ABNORMAL LOW (ref 3.5–5.0)
Anion gap: 8 (ref 5–15)
BILIRUBIN TOTAL: 0.2 mg/dL — AB (ref 0.3–1.2)
BUN: 12 mg/dL (ref 6–20)
CALCIUM: 8.5 mg/dL — AB (ref 8.9–10.3)
CO2: 26 mmol/L (ref 22–32)
CREATININE: 0.59 mg/dL (ref 0.44–1.00)
Chloride: 100 mmol/L — ABNORMAL LOW (ref 101–111)
Glucose, Bld: 108 mg/dL — ABNORMAL HIGH (ref 65–99)
Potassium: 3.5 mmol/L (ref 3.5–5.1)
SODIUM: 134 mmol/L — AB (ref 135–145)
Total Protein: 7.4 g/dL (ref 6.5–8.1)

## 2016-05-16 LAB — CBC WITH DIFFERENTIAL/PLATELET
Basophils Absolute: 0 10*3/uL (ref 0–0.1)
Basophils Relative: 0 %
Eosinophils Absolute: 0 10*3/uL (ref 0–0.7)
Eosinophils Relative: 0 %
HEMATOCRIT: 34.5 % — AB (ref 35.0–47.0)
HEMOGLOBIN: 11.3 g/dL — AB (ref 12.0–16.0)
LYMPHS ABS: 1.7 10*3/uL (ref 1.0–3.6)
LYMPHS PCT: 14 %
MCH: 28.8 pg (ref 26.0–34.0)
MCHC: 32.8 g/dL (ref 32.0–36.0)
MCV: 88 fL (ref 80.0–100.0)
Monocytes Absolute: 0.8 10*3/uL (ref 0.2–0.9)
Monocytes Relative: 7 %
NEUTROS ABS: 9.3 10*3/uL — AB (ref 1.4–6.5)
NEUTROS PCT: 79 %
Platelets: 113 10*3/uL — ABNORMAL LOW (ref 150–440)
RBC: 3.92 MIL/uL (ref 3.80–5.20)
RDW: 24.8 % — ABNORMAL HIGH (ref 11.5–14.5)
WBC: 11.8 10*3/uL — AB (ref 3.6–11.0)

## 2016-05-16 MED ORDER — IOPAMIDOL (ISOVUE-300) INJECTION 61%
100.0000 mL | Freq: Once | INTRAVENOUS | Status: AC | PRN
  Administered 2016-05-16: 100 mL via INTRAVENOUS

## 2016-05-16 NOTE — Progress Notes (Signed)
[place]  [phone]) [phone] [phone]  ID: Valerie Horton OB: 1961/04/04  MR#: [id number]  [id number]  Patient Care Team: Valerie Roys, DO as PCP - General (Family Medicine) Clent Jacks, RN as Registered Nurse  CHIEF COMPLAINT: Peritoneal carcinomatosis, metastatic adenocarcinoma of likely lower GI primary.  INTERVAL HISTORY: Patient returns to clinic today for further evaluation and consideration of cycle 7 of 12 of FOLFOX and discussion of her imaging results. She has some mild nausea after treatments, but otherwise is tolerating them well. 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 Her abdominal pain and bloating have improved. She has no neurological complaints.  She denies any recent fevers or illnesses.  She has no chest pain or shortness of breath.  She denies and nausea, vomiting, constipation, or diarrhea. She has no melena or hematochezia. She has no urinary complains.  Patient offers no further specific complaints.  REVIEW OF SYSTEMS:   Review of Systems  Constitutional: Negative for fever, malaise/fatigue and weight loss.  Respiratory: Negative.  Negative for cough and shortness of breath.   Cardiovascular: Negative.  Negative for chest pain.  Gastrointestinal: Negative for abdominal pain, blood in stool, constipation, diarrhea, melena and vomiting.  Genitourinary: Negative.   Musculoskeletal: Negative.   Neurological: Positive for sensory change. Negative for weakness.  Psychiatric/Behavioral: The patient is not nervous/anxious.     As per HPI. Otherwise, a complete review of systems is negative.  PAST MEDICAL HISTORY: Past Medical History:  Diagnosis Date  . Acid reflux   . Allergic rhinitis   . Anxiety   . Arthritis    left knee  . Cancer ([place])   . Cervical spondylosis   . CTS (carpal tunnel syndrome)    Right  . Diverticulosis   . HSV-2 (herpes simplex virus 2) infection   . Hyperlipidemia   . Ovarian  failure   . Rosacea   . Wears contact lenses     PAST SURGICAL HISTORY: Past Surgical History:  Procedure Laterality Date  . ANTERIOR CRUCIATE LIGAMENT REPAIR Left   . CESAREAN SECTION    . COLONOSCOPY WITH PROPOFOL N/A 01/29/2016   Procedure: COLONOSCOPY WITH PROPOFOL;  Surgeon: Lucilla Lame, MD;  Location: [facility];  Service: Endoscopy;  Laterality: N/A;  . ESOPHAGOGASTRODUODENOSCOPY (EGD) WITH PROPOFOL N/A 01/29/2016   Procedure: ESOPHAGOGASTRODUODENOSCOPY (EGD) WITH PROPOFOL;  Surgeon: Lucilla Lame, MD;  Location: [place];  Service: Endoscopy;  Laterality: N/A;  . PERIPHERAL VASCULAR CATHETERIZATION N/A 02/10/2016   Procedure: Glori Luis Cath Insertion;  Surgeon: Algernon Huxley, MD;  Location: [facility];  Service: Cardiovascular;  Laterality: N/A;    FAMILY HISTORY: Unknown.  Patient is adopted.     ADVANCED DIRECTIVES:    HEALTH MAINTENANCE: Social History  Substance Use Topics  . Smoking status: Former Research scientist (life sciences)  . Smokeless tobacco: Never Used     Comment: Quit in her 24's  . Alcohol use 0.0 oz/week     Comment: 1 drink/mo     Colonoscopy:  PAP:  Bone density:  Lipid panel:  No Known Allergies  Current Outpatient Prescriptions  Medication Sig Dispense Refill  . acyclovir (ZOVIRAX) 800 MG tablet Take 1 tablet (800 mg total) by mouth daily. 90 tablet 1  . escitalopram (LEXAPRO) 10 MG tablet TAKE 1 TABLET (10 MG TOTAL) BY MOUTH DAILY. 90 tablet 1  . lidocaine-prilocaine (EMLA) cream Apply to affected area once 30 g 3  . magic mouthwash SOLN Take 5 mLs by mouth  3 (three) times daily as needed for mouth pain. 240 mL 0  . ondansetron (ZOFRAN) 8 MG tablet Take 1 tablet (8 mg total) by mouth 2 (two) times daily as needed for refractory nausea / vomiting. 30 tablet 1  . pantoprazole (PROTONIX) 40 MG tablet Take 1 tablet (40 mg total) by mouth daily. 30 tablet 2  . prochlorperazine (COMPAZINE) 10 MG tablet Take 1 tablet (10 mg total) by mouth every 6  (six) hours as needed (Nausea or vomiting). 30 tablet 1   No current facility-administered medications for this visit.    Facility-Administered Medications Ordered in Other Visits  Medication Dose Route Frequency Provider Last Rate Last Dose  . sodium chloride flush (NS) 0.9 % injection 10 mL  10 mL Intracatheter PRN Lloyd Huger, MD   10 mL at 03/31/16 1144    OBJECTIVE: Vitals:   05/17/16 0953  BP: 120/74  Pulse: 88  Resp: 18  Temp: 97.5 F (36.4 C)     Body mass index is 23.52 kg/m.    ECOG FS:0 - Asymptomatic  General: Well-developed, well-nourished, no acute distress. Eyes: Pink conjunctiva, anicteric sclera. Lungs: Clear to auscultation bilaterally. Heart: Regular rate and rhythm. No rubs, murmurs, or gallops. Abdomen: Soft, nontender, non-distended.  Musculoskeletal: No edema, cyanosis, or clubbing. Neuro: Alert, answering all questions appropriately. Cranial nerves grossly intact. Skin: No rashes or petechiae noted. Psych: Normal affect.   LAB RESULTS:  Lab Results  Component Value Date   NA 134 (L) 05/16/2016   K 3.5 05/16/2016   CL 100 (L) 05/16/2016   CO2 26 05/16/2016   GLUCOSE 108 (H) 05/16/2016   BUN 12 05/16/2016   CREATININE 0.59 05/16/2016   CALCIUM 8.5 (L) 05/16/2016   PROT 7.4 05/16/2016   ALBUMIN 3.3 (L) 05/16/2016   AST 31 05/16/2016   ALT 17 05/16/2016   ALKPHOS 157 (H) 05/16/2016   BILITOT 0.2 (L) 05/16/2016   GFRNONAA >60 05/16/2016   GFRAA >60 05/16/2016    Lab Results  Component Value Date   WBC 11.8 (H) 05/16/2016   NEUTROABS 9.3 (H) 05/16/2016   HGB 11.3 (L) 05/16/2016   HCT 34.5 (L) 05/16/2016   MCV 88.0 05/16/2016   PLT 113 (L) 05/16/2016   Lab Results  Component Value Date   CA125 86.6 (H) 03/29/2016     STUDIES: Ct Abdomen Pelvis W Contrast  Result Date: 05/16/2016 CLINICAL DATA:  Peritoneal carcinomatosis. EXAM: CT ABDOMEN AND PELVIS WITH CONTRAST TECHNIQUE: Multidetector CT imaging of the abdomen and  pelvis was performed using the standard protocol following bolus administration of intravenous contrast. CONTRAST:  17mL ISOVUE-300 IOPAMIDOL (ISOVUE-300) INJECTION 61% COMPARISON:  01/11/2016. FINDINGS: Lower chest: Subsegmental atelectasis identified right lower lobe with compressive atelectasis left lung base and tiny left pleural effusion. Hepatobiliary: No focal abnormality within the liver parenchyma. There is no evidence for gallstones, gallbladder wall thickening, or pericholecystic fluid. No intrahepatic or extrahepatic biliary dilation. Pancreas: No focal mass lesion. No dilatation of the main duct. No intraparenchymal cyst. No peripancreatic edema. Spleen: No splenomegaly. No focal mass lesion. Adrenals/Urinary Tract: No adrenal nodule or mass. Kidneys are unremarkable. No evidence for hydroureter. The urinary bladder appears normal for the degree of distention. Stomach/Bowel: Stomach is nondistended. No gastric wall thickening. No evidence of outlet obstruction. Duodenum is normally positioned as is the ligament of Treitz. No small bowel wall thickening. No small bowel dilatation. The terminal ileum is normal. The appendix is not visualized, but there is no edema or inflammation in  the region of the cecum. No gross colonic mass. No colonic wall thickening. No substantial diverticular change. Vascular/Lymphatic: No abdominal aortic aneurysm. No abdominal aortic atherosclerotic calcification. Portal vein is patent. Paraesophageal varices are evident. Superior mesenteric vein and splenic vein are patent. No evidence for retroperitoneal lymphadenopathy. No pelvic sidewall lymphadenopathy. Reproductive: The uterus has normal CT imaging appearance. There is no adnexal mass. Other: Fluid volume around the liver appears decreased in the interval. Measuring adjacent to the inferior right hepatic lobe, the thickness of the peritoneal disease/ fluid was about 2.3 cm on the prior study which compares to 0.7 cm on  today's exam. Scattered areas of peritoneal thickening/irregularity are again noted. Omental disease appears stable in the interval. The peritoneal thickening and fluid in the cul-de-sac is stable in the interval. Musculoskeletal: Bone windows reveal no worrisome lytic or sclerotic osseous lesions. IMPRESSION: 1. No substantial interval change in exam. There may be slightly less fluid attenuation disease in the upper abdomen, but the degree of peritoneal thickening/irregularity and fluid volume in the pelvis is not appreciably changed in the interval. Electronically Signed   By: Misty Stanley M.D.   On: 05/16/2016 08:59    ASSESSMENT: Peritoneal carcinomatosis, metastatic adenocarcinoma of likely lower GI primary.  PLAN:    1. Peritoneal carcinomatosis: Pathology results reviewed, immunohistochemistry suggested lower GI primary. CT results from May 16, 2016 essentially revealed stable disease. Of note, patient had a normal colonoscopy on October 16, 2014 in [place], [place]. Patient also had second opinion at [facility] that concurs with the plan. Proceed with cycle 7 of 12 of FOLFOX today. Will add Neulasta with discontinuation of pump for the remainder of the cycles. Return to clinic in 2 days for pump removal and Neulasta, then in 2 weeks for consideration of cycle 8. Plan to reimage after at the conclusion of cycle 12. 2: Genetic testing: Negative. 3. Anemia: Mild, monitor. 4. Thrombocytopenia: Resolved. 5. Neutropenia: Neulasta with the remainder treatments as above. 6. Peripheral neuropathy: Monitor while receiving oxaliplatin.  Patient expressed understanding and was in agreement with this plan. She also understands that She can call clinic at any time with any questions, concerns, or complaints.   No matching staging information was found for the patient.  Lloyd Huger, MD   05/17/2016 10:16 AM

## 2016-05-17 ENCOUNTER — Inpatient Hospital Stay: Payer: BLUE CROSS/BLUE SHIELD

## 2016-05-17 ENCOUNTER — Other Ambulatory Visit: Payer: BLUE CROSS/BLUE SHIELD

## 2016-05-17 ENCOUNTER — Inpatient Hospital Stay (HOSPITAL_BASED_OUTPATIENT_CLINIC_OR_DEPARTMENT_OTHER): Payer: BLUE CROSS/BLUE SHIELD | Admitting: Oncology

## 2016-05-17 VITALS — BP 120/74 | HR 88 | Temp 97.5°F | Resp 18 | Wt 134.5 lb

## 2016-05-17 DIAGNOSIS — C801 Malignant (primary) neoplasm, unspecified: Secondary | ICD-10-CM

## 2016-05-17 DIAGNOSIS — K219 Gastro-esophageal reflux disease without esophagitis: Secondary | ICD-10-CM

## 2016-05-17 DIAGNOSIS — C786 Secondary malignant neoplasm of retroperitoneum and peritoneum: Secondary | ICD-10-CM

## 2016-05-17 DIAGNOSIS — T451X5S Adverse effect of antineoplastic and immunosuppressive drugs, sequela: Secondary | ICD-10-CM | POA: Diagnosis not present

## 2016-05-17 DIAGNOSIS — Z79899 Other long term (current) drug therapy: Secondary | ICD-10-CM

## 2016-05-17 DIAGNOSIS — Z87891 Personal history of nicotine dependence: Secondary | ICD-10-CM

## 2016-05-17 DIAGNOSIS — M1712 Unilateral primary osteoarthritis, left knee: Secondary | ICD-10-CM

## 2016-05-17 DIAGNOSIS — D701 Agranulocytosis secondary to cancer chemotherapy: Secondary | ICD-10-CM | POA: Diagnosis not present

## 2016-05-17 DIAGNOSIS — D649 Anemia, unspecified: Secondary | ICD-10-CM

## 2016-05-17 DIAGNOSIS — R110 Nausea: Secondary | ICD-10-CM

## 2016-05-17 DIAGNOSIS — E785 Hyperlipidemia, unspecified: Secondary | ICD-10-CM

## 2016-05-17 DIAGNOSIS — R140 Abdominal distension (gaseous): Secondary | ICD-10-CM

## 2016-05-17 DIAGNOSIS — G629 Polyneuropathy, unspecified: Secondary | ICD-10-CM

## 2016-05-17 MED ORDER — SODIUM CHLORIDE 0.9 % IV SOLN: 10.0000 mg | Freq: Once | INTRAVENOUS | Status: DC

## 2016-05-17 MED ORDER — FLUOROURACIL CHEMO INJECTION 2.5 GM/50ML
400.0000 mg/m2 | Freq: Once | INTRAVENOUS | Status: AC
  Administered 2016-05-17: 700 mg via INTRAVENOUS
  Filled 2016-05-17: qty 14

## 2016-05-17 MED ORDER — DEXTROSE 5 % IV SOLN
Freq: Once | INTRAVENOUS | Status: AC
  Administered 2016-05-17: 11:00:00 via INTRAVENOUS
  Filled 2016-05-17: qty 1000

## 2016-05-17 MED ORDER — PALONOSETRON HCL INJECTION 0.25 MG/5ML
0.2500 mg | Freq: Once | INTRAVENOUS | Status: AC
  Administered 2016-05-17: 0.25 mg via INTRAVENOUS
  Filled 2016-05-17: qty 5

## 2016-05-17 MED ORDER — SODIUM CHLORIDE 0.9 % IV SOLN
2400.0000 mg/m2 | INTRAVENOUS | Status: DC
  Administered 2016-05-17: 4200 mg via INTRAVENOUS
  Filled 2016-05-17: qty 84

## 2016-05-17 MED ORDER — DEXAMETHASONE SODIUM PHOSPHATE 10 MG/ML IJ SOLN
10.0000 mg | Freq: Once | INTRAMUSCULAR | Status: AC
  Administered 2016-05-17: 10 mg via INTRAVENOUS

## 2016-05-17 MED ORDER — LEUCOVORIN CALCIUM INJECTION 350 MG
400.0000 mg/m2 | Freq: Once | INTRAVENOUS | Status: AC
  Administered 2016-05-17: 700 mg via INTRAVENOUS
  Filled 2016-05-17: qty 20

## 2016-05-17 MED ORDER — OXALIPLATIN CHEMO INJECTION 100 MG/20ML
77.0000 mg/m2 | Freq: Once | INTRAVENOUS | Status: AC
  Administered 2016-05-17: 135 mg via INTRAVENOUS
  Filled 2016-05-17: qty 20

## 2016-05-17 NOTE — Progress Notes (Signed)
Had a couple days of severe neuropathy and could not start car. Neuropathy has resolved today.

## 2016-05-19 ENCOUNTER — Encounter: Payer: Self-pay | Admitting: Oncology

## 2016-05-19 ENCOUNTER — Other Ambulatory Visit: Payer: Self-pay | Admitting: *Deleted

## 2016-05-19 ENCOUNTER — Inpatient Hospital Stay: Payer: BLUE CROSS/BLUE SHIELD

## 2016-05-19 VITALS — BP 106/67 | HR 86 | Temp 99.1°F | Resp 20

## 2016-05-19 DIAGNOSIS — C786 Secondary malignant neoplasm of retroperitoneum and peritoneum: Secondary | ICD-10-CM | POA: Diagnosis not present

## 2016-05-19 DIAGNOSIS — C801 Malignant (primary) neoplasm, unspecified: Secondary | ICD-10-CM

## 2016-05-19 MED ORDER — SODIUM CHLORIDE 0.9 % IJ SOLN
10.0000 mL | Freq: Once | INTRAMUSCULAR | Status: AC
  Administered 2016-05-19: 10 mL via INTRAVENOUS
  Filled 2016-05-19: qty 10

## 2016-05-19 MED ORDER — HEPARIN SOD (PORK) LOCK FLUSH 100 UNIT/ML IV SOLN
500.0000 [IU] | Freq: Once | INTRAVENOUS | Status: AC
  Administered 2016-05-19: 500 [IU] via INTRAVENOUS
  Filled 2016-05-19: qty 5

## 2016-05-19 MED ORDER — PEGFILGRASTIM INJECTION 6 MG/0.6ML ~~LOC~~
6.0000 mg | PREFILLED_SYRINGE | Freq: Once | SUBCUTANEOUS | Status: AC
  Administered 2016-05-19: 6 mg via SUBCUTANEOUS
  Filled 2016-05-19: qty 0.6

## 2016-05-30 NOTE — Progress Notes (Signed)
[place]  [phone]) [phone] [phone]) [phone]  ID: SKARLET TROCHEZ OB: [birth year]  MR#: [id number]  [id number]  Patient Care Team: Valerie Roys, DO as PCP - General (Family Medicine) Clent Jacks, RN as Registered Nurse Garnetta Buddy, MD as Consulting Physician (Internal Medicine)  CHIEF COMPLAINT: Peritoneal carcinomatosis, metastatic adenocarcinoma of likely lower GI primary.  INTERVAL HISTORY: Patient returns to clinic today for further evaluation and consideration of cycle 8 of 12 of FOLFOX. She has some mild nausea after treatments, but otherwise is tolerating them well. 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 Her abdominal pain and bloating have improved. She has no neurological complaints.  She denies any recent fevers or illnesses.  She has no chest pain or shortness of breath.  She denies and nausea, vomiting, constipation, or diarrhea. She has no melena or hematochezia. She has no urinary complains.  Patient offers no further specific complaints.  REVIEW OF SYSTEMS:   Review of Systems  Constitutional: Negative for fever, malaise/fatigue and weight loss.  Respiratory: Negative.  Negative for cough and shortness of breath.   Cardiovascular: Negative.  Negative for chest pain.  Gastrointestinal: Negative for abdominal pain, blood in stool, constipation, diarrhea, melena and vomiting.  Genitourinary: Negative.   Musculoskeletal: Negative.   Neurological: Positive for sensory change. Negative for weakness.  Psychiatric/Behavioral: The patient is not nervous/anxious.     As per HPI. Otherwise, a complete review of systems is negative.  PAST MEDICAL HISTORY: Past Medical History:  Diagnosis Date  . Acid reflux   . Allergic rhinitis   . Anxiety   . Arthritis    left knee  . Cancer ([place])   . Cervical spondylosis   . CTS (carpal tunnel syndrome)    Right  . Diverticulosis   . HSV-2 (herpes simplex virus 2) infection   .  Hyperlipidemia   . Ovarian failure   . Rosacea   . Wears contact lenses     PAST SURGICAL HISTORY: Past Surgical History:  Procedure Laterality Date  . ANTERIOR CRUCIATE LIGAMENT REPAIR Left   . CESAREAN SECTION    . COLONOSCOPY WITH PROPOFOL N/A 01/29/2016   Procedure: COLONOSCOPY WITH PROPOFOL;  Surgeon: Lucilla Lame, MD;  Location: [facility];  Service: Endoscopy;  Laterality: N/A;  . ESOPHAGOGASTRODUODENOSCOPY (EGD) WITH PROPOFOL N/A 01/29/2016   Procedure: ESOPHAGOGASTRODUODENOSCOPY (EGD) WITH PROPOFOL;  Surgeon: Lucilla Lame, MD;  Location: [facility];  Service: Endoscopy;  Laterality: N/A;  . PERIPHERAL VASCULAR CATHETERIZATION N/A 02/10/2016   Procedure: Glori Luis Cath Insertion;  Surgeon: Algernon Huxley, MD;  Location: [facility];  Service: Cardiovascular;  Laterality: N/A;    FAMILY HISTORY: Unknown.  Patient is adopted.     ADVANCED DIRECTIVES:    HEALTH MAINTENANCE: Social History  Substance Use Topics  . Smoking status: Former Research scientist (life sciences)  . Smokeless tobacco: Never Used     Comment: Quit in her 75's  . Alcohol use 0.0 oz/week     Comment: 1 drink/mo     Colonoscopy:  PAP:  Bone density:  Lipid panel:  No Known Allergies  Current Outpatient Prescriptions  Medication Sig Dispense Refill  . acyclovir (ZOVIRAX) 800 MG tablet Take 1 tablet (800 mg total) by mouth daily. 90 tablet 1  . escitalopram (LEXAPRO) 10 MG tablet TAKE 1 TABLET (10 MG TOTAL) BY MOUTH DAILY. 90 tablet 1  . lidocaine-prilocaine (EMLA) cream Apply to affected area once 30 g 3  . magic mouthwash SOLN Take 5  mLs by mouth 3 (three) times daily as needed for mouth pain. 240 mL 0  . ondansetron (ZOFRAN) 8 MG tablet Take 1 tablet (8 mg total) by mouth 2 (two) times daily as needed for refractory nausea / vomiting. 30 tablet 1  . pantoprazole (PROTONIX) 40 MG tablet Take 1 tablet (40 mg total) by mouth daily. 30 tablet 2  . prochlorperazine (COMPAZINE) 10 MG tablet Take 1 tablet (10 mg  total) by mouth every 6 (six) hours as needed (Nausea or vomiting). 30 tablet 1   No current facility-administered medications for this visit.    Facility-Administered Medications Ordered in Other Visits  Medication Dose Route Frequency Provider Last Rate Last Dose  . sodium chloride flush (NS) 0.9 % injection 10 mL  10 mL Intracatheter PRN Lloyd Huger, MD   10 mL at 03/31/16 1144    OBJECTIVE: Vitals:   05/31/16 1039  BP: 114/76  Pulse: 84  Resp: 18  Temp: 98.7 F (37.1 C)     Body mass index is 24 kg/m.    ECOG FS:0 - Asymptomatic  General: Well-developed, well-nourished, no acute distress. Eyes: Pink conjunctiva, anicteric sclera. Lungs: Clear to auscultation bilaterally. Heart: Regular rate and rhythm. No rubs, murmurs, or gallops. Abdomen: Soft, nontender, non-distended.  Musculoskeletal: No edema, cyanosis, or clubbing. Neuro: Alert, answering all questions appropriately. Cranial nerves grossly intact. Skin: No rashes or petechiae noted. Psych: Normal affect.   LAB RESULTS:  Lab Results  Component Value Date   NA 135 05/31/2016   K 3.6 05/31/2016   CL 104 05/31/2016   CO2 23 05/31/2016   GLUCOSE 107 (H) 05/31/2016   BUN 11 05/31/2016   CREATININE 0.66 05/31/2016   CALCIUM 8.3 (L) 05/31/2016   PROT 6.9 05/31/2016   ALBUMIN 3.2 (L) 05/31/2016   AST 32 05/31/2016   ALT 16 05/31/2016   ALKPHOS 154 (H) 05/31/2016   BILITOT 0.4 05/31/2016   GFRNONAA >60 05/31/2016   GFRAA >60 05/31/2016    Lab Results  Component Value Date   WBC 12.1 (H) 05/31/2016   NEUTROABS 9.3 (H) 05/31/2016   HGB 10.3 (L) 05/31/2016   HCT 30.4 (L) 05/31/2016   MCV 91.1 05/31/2016   PLT 95 (L) 05/31/2016   Lab Results  Component Value Date   CA125 86.6 (H) 03/29/2016     STUDIES: Ct Abdomen Pelvis W Contrast  Result Date: 05/16/2016 CLINICAL DATA:  Peritoneal carcinomatosis. EXAM: CT ABDOMEN AND PELVIS WITH CONTRAST TECHNIQUE: Multidetector CT imaging of the abdomen  and pelvis was performed using the standard protocol following bolus administration of intravenous contrast. CONTRAST:  15mL ISOVUE-300 IOPAMIDOL (ISOVUE-300) INJECTION 61% COMPARISON:  01/11/2016. FINDINGS: Lower chest: Subsegmental atelectasis identified right lower lobe with compressive atelectasis left lung base and tiny left pleural effusion. Hepatobiliary: No focal abnormality within the liver parenchyma. There is no evidence for gallstones, gallbladder wall thickening, or pericholecystic fluid. No intrahepatic or extrahepatic biliary dilation. Pancreas: No focal mass lesion. No dilatation of the main duct. No intraparenchymal cyst. No peripancreatic edema. Spleen: No splenomegaly. No focal mass lesion. Adrenals/Urinary Tract: No adrenal nodule or mass. Kidneys are unremarkable. No evidence for hydroureter. The urinary bladder appears normal for the degree of distention. Stomach/Bowel: Stomach is nondistended. No gastric wall thickening. No evidence of outlet obstruction. Duodenum is normally positioned as is the ligament of Treitz. No small bowel wall thickening. No small bowel dilatation. The terminal ileum is normal. The appendix is not visualized, but there is no edema or inflammation in  the region of the cecum. No gross colonic mass. No colonic wall thickening. No substantial diverticular change. Vascular/Lymphatic: No abdominal aortic aneurysm. No abdominal aortic atherosclerotic calcification. Portal vein is patent. Paraesophageal varices are evident. Superior mesenteric vein and splenic vein are patent. No evidence for retroperitoneal lymphadenopathy. No pelvic sidewall lymphadenopathy. Reproductive: The uterus has normal CT imaging appearance. There is no adnexal mass. Other: Fluid volume around the liver appears decreased in the interval. Measuring adjacent to the inferior right hepatic lobe, the thickness of the peritoneal disease/ fluid was about 2.3 cm on the prior study which compares to 0.7 cm  on today's exam. Scattered areas of peritoneal thickening/irregularity are again noted. Omental disease appears stable in the interval. The peritoneal thickening and fluid in the cul-de-sac is stable in the interval. Musculoskeletal: Bone windows reveal no worrisome lytic or sclerotic osseous lesions. IMPRESSION: 1. No substantial interval change in exam. There may be slightly less fluid attenuation disease in the upper abdomen, but the degree of peritoneal thickening/irregularity and fluid volume in the pelvis is not appreciably changed in the interval. Electronically Signed   By: Misty Stanley M.D.   On: 05/16/2016 08:59    ASSESSMENT: Peritoneal carcinomatosis, metastatic adenocarcinoma of likely lower GI primary.  PLAN:    1. Peritoneal carcinomatosis: Pathology results reviewed, immunohistochemistry suggested lower GI primary. CT results from May 16, 2016 essentially revealed stable disease. Of note, patient had a normal colonoscopy on October 16, 2014 in [place], [place]. Patient also had [facility] opinion at [facility] that concurs with the plan. Proceed with cycle 8 of 12 of FOLFOX today. Will add Neulasta with discontinuation of pump for the remainder of the cycles. Return to clinic in 2 days for pump removal and Neulasta, then in 2 weeks for consideration of cycle 9. Plan to reimage after at the conclusion of cycle 12. 2: Genetic testing: Negative. 3. Anemia: Mild, monitor. 4. Thrombocytopenia: Resolved. 5. Neutropenia: Neulasta with the remainder treatments as above. 6. Peripheral neuropathy: Monitor while receiving oxaliplatin.  Patient expressed understanding and was in agreement with this plan. She also understands that She can call clinic at any time with any questions, concerns, or complaints.   No matching staging information was found for the patient.  Lloyd Huger, MD   06/04/2016 7:34 AM

## 2016-05-31 ENCOUNTER — Inpatient Hospital Stay: Payer: BLUE CROSS/BLUE SHIELD

## 2016-05-31 ENCOUNTER — Inpatient Hospital Stay (HOSPITAL_BASED_OUTPATIENT_CLINIC_OR_DEPARTMENT_OTHER): Payer: BLUE CROSS/BLUE SHIELD | Admitting: Oncology

## 2016-05-31 VITALS — BP 114/76 | HR 84 | Temp 98.7°F | Resp 18 | Wt 137.2 lb

## 2016-05-31 DIAGNOSIS — D701 Agranulocytosis secondary to cancer chemotherapy: Secondary | ICD-10-CM

## 2016-05-31 DIAGNOSIS — C786 Secondary malignant neoplasm of retroperitoneum and peritoneum: Secondary | ICD-10-CM | POA: Diagnosis not present

## 2016-05-31 DIAGNOSIS — C801 Malignant (primary) neoplasm, unspecified: Principal | ICD-10-CM

## 2016-05-31 DIAGNOSIS — Z79899 Other long term (current) drug therapy: Secondary | ICD-10-CM

## 2016-05-31 DIAGNOSIS — Z87891 Personal history of nicotine dependence: Secondary | ICD-10-CM

## 2016-05-31 DIAGNOSIS — T451X5S Adverse effect of antineoplastic and immunosuppressive drugs, sequela: Secondary | ICD-10-CM | POA: Diagnosis not present

## 2016-05-31 DIAGNOSIS — R140 Abdominal distension (gaseous): Secondary | ICD-10-CM

## 2016-05-31 DIAGNOSIS — K219 Gastro-esophageal reflux disease without esophagitis: Secondary | ICD-10-CM

## 2016-05-31 DIAGNOSIS — R109 Unspecified abdominal pain: Secondary | ICD-10-CM

## 2016-05-31 DIAGNOSIS — D649 Anemia, unspecified: Secondary | ICD-10-CM

## 2016-05-31 DIAGNOSIS — G629 Polyneuropathy, unspecified: Secondary | ICD-10-CM

## 2016-05-31 DIAGNOSIS — E785 Hyperlipidemia, unspecified: Secondary | ICD-10-CM

## 2016-05-31 DIAGNOSIS — M1712 Unilateral primary osteoarthritis, left knee: Secondary | ICD-10-CM

## 2016-05-31 LAB — COMPREHENSIVE METABOLIC PANEL
ALK PHOS: 154 U/L — AB (ref 38–126)
ALT: 16 U/L (ref 14–54)
AST: 32 U/L (ref 15–41)
Albumin: 3.2 g/dL — ABNORMAL LOW (ref 3.5–5.0)
Anion gap: 8 (ref 5–15)
BUN: 11 mg/dL (ref 6–20)
CALCIUM: 8.3 mg/dL — AB (ref 8.9–10.3)
CO2: 23 mmol/L (ref 22–32)
CREATININE: 0.66 mg/dL (ref 0.44–1.00)
Chloride: 104 mmol/L (ref 101–111)
GFR calc non Af Amer: >60 mL/min (ref >60)
Glucose, Bld: 107 mg/dL — ABNORMAL HIGH (ref 65–99)
Potassium: 3.6 mmol/L (ref 3.5–5.1)
SODIUM: 135 mmol/L (ref 135–145)
Total Bilirubin: 0.4 mg/dL (ref 0.3–1.2)
Total Protein: 6.9 g/dL (ref 6.5–8.1)

## 2016-05-31 LAB — CBC WITH DIFFERENTIAL/PLATELET
Basophils Absolute: 0 10*3/uL (ref 0–0.1)
Basophils Relative: 0 %
Eosinophils Absolute: 0 10*3/uL (ref 0–0.7)
Eosinophils Relative: 0 %
HCT: 30.4 % — ABNORMAL LOW (ref 35.0–47.0)
HEMOGLOBIN: 10.3 g/dL — AB (ref 12.0–16.0)
LYMPHS ABS: 1.7 10*3/uL (ref 1.0–3.6)
LYMPHS PCT: 14 %
MCH: 30.8 pg (ref 26.0–34.0)
MCHC: 33.9 g/dL (ref 32.0–36.0)
MCV: 91.1 fL (ref 80.0–100.0)
Monocytes Absolute: 1 10*3/uL — ABNORMAL HIGH (ref 0.2–0.9)
Monocytes Relative: 9 %
NEUTROS PCT: 77 %
Neutro Abs: 9.3 10*3/uL — ABNORMAL HIGH (ref 1.4–6.5)
Platelets: 95 10*3/uL — ABNORMAL LOW (ref 150–440)
RBC: 3.34 MIL/uL — AB (ref 3.80–5.20)
RDW: 25.2 % — ABNORMAL HIGH (ref 11.5–14.5)
WBC: 12.1 10*3/uL — AB (ref 3.6–11.0)

## 2016-05-31 MED ORDER — OXALIPLATIN CHEMO INJECTION 100 MG/20ML
77.0000 mg/m2 | Freq: Once | INTRAVENOUS | Status: AC
  Administered 2016-05-31: 135 mg via INTRAVENOUS
  Filled 2016-05-31: qty 7

## 2016-05-31 MED ORDER — DEXAMETHASONE SODIUM PHOSPHATE 10 MG/ML IJ SOLN
10.0000 mg | Freq: Once | INTRAMUSCULAR | Status: AC
  Administered 2016-05-31: 10 mg via INTRAVENOUS
  Filled 2016-05-31: qty 1

## 2016-05-31 MED ORDER — FLUOROURACIL CHEMO INJECTION 2.5 GM/50ML
400.0000 mg/m2 | Freq: Once | INTRAVENOUS | Status: AC
  Administered 2016-05-31: 700 mg via INTRAVENOUS
  Filled 2016-05-31: qty 14

## 2016-05-31 MED ORDER — DEXAMETHASONE SODIUM PHOSPHATE 100 MG/10ML IJ SOLN: 10.0000 mg | Freq: Once | INTRAMUSCULAR | Status: DC

## 2016-05-31 MED ORDER — SODIUM CHLORIDE 0.9 % IV SOLN
2400.0000 mg/m2 | INTRAVENOUS | Status: DC
  Administered 2016-05-31: 4200 mg via INTRAVENOUS
  Filled 2016-05-31: qty 84

## 2016-05-31 MED ORDER — LEUCOVORIN CALCIUM INJECTION 350 MG
400.0000 mg/m2 | Freq: Once | INTRAVENOUS | Status: AC
  Administered 2016-05-31: 700 mg via INTRAVENOUS
  Filled 2016-05-31: qty 35

## 2016-05-31 MED ORDER — SODIUM CHLORIDE 0.9% FLUSH
10.0000 mL | INTRAVENOUS | Status: DC | PRN
  Administered 2016-05-31: 10 mL via INTRAVENOUS
  Filled 2016-05-31: qty 10

## 2016-05-31 MED ORDER — DEXTROSE 5 % IV SOLN
Freq: Once | INTRAVENOUS | Status: AC
  Administered 2016-05-31: 12:00:00 via INTRAVENOUS
  Filled 2016-05-31: qty 1000

## 2016-05-31 MED ORDER — HEPARIN SOD (PORK) LOCK FLUSH 100 UNIT/ML IV SOLN: 500.0000 [IU] | Freq: Once | INTRAVENOUS | Status: DC

## 2016-05-31 MED ORDER — PALONOSETRON HCL INJECTION 0.25 MG/5ML
0.2500 mg | Freq: Once | INTRAVENOUS | Status: AC
  Administered 2016-05-31: 0.25 mg via INTRAVENOUS

## 2016-05-31 NOTE — Progress Notes (Signed)
Platelets: 95,000. MD, Dr. Grayland Ormond, notified via telephone and aware. Per MD order: proceed with treatment.

## 2016-05-31 NOTE — Progress Notes (Signed)
Offers no complaints. Feeling well. 

## 2016-06-02 ENCOUNTER — Inpatient Hospital Stay: Payer: BLUE CROSS/BLUE SHIELD

## 2016-06-02 VITALS — BP 118/69 | HR 83 | Temp 97.0°F | Resp 20

## 2016-06-02 DIAGNOSIS — C786 Secondary malignant neoplasm of retroperitoneum and peritoneum: Secondary | ICD-10-CM

## 2016-06-02 DIAGNOSIS — C801 Malignant (primary) neoplasm, unspecified: Principal | ICD-10-CM

## 2016-06-02 MED ORDER — HEPARIN SOD (PORK) LOCK FLUSH 100 UNIT/ML IV SOLN
500.0000 [IU] | Freq: Once | INTRAVENOUS | Status: AC | PRN
  Administered 2016-06-02: 500 [IU]
  Filled 2016-06-02: qty 5

## 2016-06-02 MED ORDER — PEGFILGRASTIM INJECTION 6 MG/0.6ML ~~LOC~~
6.0000 mg | PREFILLED_SYRINGE | Freq: Once | SUBCUTANEOUS | Status: AC
  Administered 2016-06-02: 6 mg via SUBCUTANEOUS
  Filled 2016-06-02: qty 0.6

## 2016-06-02 MED ORDER — SODIUM CHLORIDE 0.9% FLUSH
10.0000 mL | INTRAVENOUS | Status: DC | PRN
  Administered 2016-06-02: 10 mL
  Filled 2016-06-02: qty 10

## 2016-06-12 NOTE — Progress Notes (Signed)
[place]  Telephone:(336) [phone] [phone]) [phone]  ID: Valerie Horton OB: January 12, 1961  MR#: [id number]  [id number]  Patient Care Team: Valerie Roys, DO as PCP - General (Family Medicine) Clent Jacks, RN as Registered Nurse Garnetta Buddy, MD as Consulting Physician (Internal Medicine)  CHIEF COMPLAINT: Peritoneal carcinomatosis, metastatic adenocarcinoma of likely lower GI primary.  INTERVAL HISTORY: Patient returns to clinic today for further evaluation and consideration of cycle 9 of 12 of FOLFOX. She has some mild nausea after treatments, but otherwise is tolerating them well. 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 Her abdominal pain and bloating have improved. She has no neurological complaints.  She denies any recent fevers or illnesses.  She has no chest pain or shortness of breath.  She denies and nausea, vomiting, constipation, or diarrhea. She has no melena or hematochezia. She has no urinary complains.  Patient offers no further specific complaints.  REVIEW OF SYSTEMS:   Review of Systems  Constitutional: Negative for fever, malaise/fatigue and weight loss.  Respiratory: Negative.  Negative for cough and shortness of breath.   Cardiovascular: Negative.  Negative for chest pain.  Gastrointestinal: Negative for abdominal pain, blood in stool, constipation, diarrhea, melena and vomiting.  Genitourinary: Negative.   Musculoskeletal: Negative.   Neurological: Positive for sensory change. Negative for weakness.  Psychiatric/Behavioral: The patient is not nervous/anxious.     As per HPI. Otherwise, a complete review of systems is negative.  PAST MEDICAL HISTORY: Past Medical History:  Diagnosis Date  . Acid reflux   . Allergic rhinitis   . Anxiety   . Arthritis    left knee  . Cancer ([place])   . Cervical spondylosis   . CTS (carpal tunnel syndrome)    Right  . Diverticulosis   . HSV-2 (herpes simplex virus 2) infection   .  Hyperlipidemia   . Ovarian failure   . Rosacea   . Wears contact lenses     PAST SURGICAL HISTORY: Past Surgical History:  Procedure Laterality Date  . ANTERIOR CRUCIATE LIGAMENT REPAIR Left   . CESAREAN SECTION    . COLONOSCOPY WITH PROPOFOL N/A 01/29/2016   Procedure: COLONOSCOPY WITH PROPOFOL;  Surgeon: Lucilla Lame, MD;  Location: [facility];  Service: Endoscopy;  Laterality: N/A;  . ESOPHAGOGASTRODUODENOSCOPY (EGD) WITH PROPOFOL N/A 01/29/2016   Procedure: ESOPHAGOGASTRODUODENOSCOPY (EGD) WITH PROPOFOL;  Surgeon: Lucilla Lame, MD;  Location: [facility];  Service: Endoscopy;  Laterality: N/A;  . PERIPHERAL VASCULAR CATHETERIZATION N/A 02/10/2016   Procedure: Glori Luis Cath Insertion;  Surgeon: Algernon Huxley, MD;  Location: [facility];  Service: Cardiovascular;  Laterality: N/A;    FAMILY HISTORY: Unknown.  Patient is adopted.     ADVANCED DIRECTIVES:    HEALTH MAINTENANCE: Social History  Substance Use Topics  . Smoking status: Former Research scientist (life sciences)  . Smokeless tobacco: Never Used     Comment: Quit in her 74's  . Alcohol use 0.0 oz/week     Comment: 1 drink/mo     Colonoscopy:  PAP:  Bone density:  Lipid panel:  No Known Allergies  Current Outpatient Prescriptions  Medication Sig Dispense Refill  . acyclovir (ZOVIRAX) 800 MG tablet Take 1 tablet (800 mg total) by mouth daily. 90 tablet 1  . escitalopram (LEXAPRO) 10 MG tablet TAKE 1 TABLET (10 MG TOTAL) BY MOUTH DAILY. 90 tablet 1  . lidocaine-prilocaine (EMLA) cream Apply to affected area once 30 g 3  . magic mouthwash SOLN Take 5  mLs by mouth 3 (three) times daily as needed for mouth pain. 240 mL 0  . ondansetron (ZOFRAN) 8 MG tablet Take 1 tablet (8 mg total) by mouth 2 (two) times daily as needed for refractory nausea / vomiting. 30 tablet 1  . pantoprazole (PROTONIX) 40 MG tablet Take 1 tablet (40 mg total) by mouth daily. 30 tablet 2  . prochlorperazine (COMPAZINE) 10 MG tablet Take 1 tablet (10 mg  total) by mouth every 6 (six) hours as needed (Nausea or vomiting). 30 tablet 1   No current facility-administered medications for this visit.    Facility-Administered Medications Ordered in Other Visits  Medication Dose Route Frequency Provider Last Rate Last Dose  . sodium chloride flush (NS) 0.9 % injection 10 mL  10 mL Intracatheter PRN Lloyd Huger, MD   10 mL at 03/31/16 1144    OBJECTIVE: Vitals:   06/14/16 0922  BP: 104/61  Pulse: 91  Resp: 18  Temp: 97.9 F (36.6 C)     Body mass index is 24.04 kg/m.    ECOG FS:0 - Asymptomatic  General: Well-developed, well-nourished, no acute distress. Eyes: Pink conjunctiva, anicteric sclera. Lungs: Clear to auscultation bilaterally. Heart: Regular rate and rhythm. No rubs, murmurs, or gallops. Abdomen: Soft, nontender, non-distended.  Musculoskeletal: No edema, cyanosis, or clubbing. Neuro: Alert, answering all questions appropriately. Cranial nerves grossly intact. Skin: No rashes or petechiae noted. Psych: Normal affect.   LAB RESULTS:  Lab Results  Component Value Date   NA 137 06/14/2016   K 3.9 06/14/2016   CL 106 06/14/2016   CO2 23 06/14/2016   GLUCOSE 105 (H) 06/14/2016   BUN 10 06/14/2016   CREATININE 0.66 06/14/2016   CALCIUM 8.5 (L) 06/14/2016   PROT 7.0 06/14/2016   ALBUMIN 3.2 (L) 06/14/2016   AST 34 06/14/2016   ALT 16 06/14/2016   ALKPHOS 156 (H) 06/14/2016   BILITOT 0.4 06/14/2016   GFRNONAA >60 06/14/2016   GFRAA >60 06/14/2016    Lab Results  Component Value Date   WBC 8.5 06/14/2016   NEUTROABS 6.4 06/14/2016   HGB 10.6 (L) 06/14/2016   HCT 31.9 (L) 06/14/2016   MCV 95.3 06/14/2016   PLT 78 (L) 06/14/2016   Lab Results  Component Value Date   CA125 86.6 (H) 03/29/2016     STUDIES: Ct Abdomen Pelvis W Contrast  Result Date: 05/16/2016 CLINICAL DATA:  Peritoneal carcinomatosis. EXAM: CT ABDOMEN AND PELVIS WITH CONTRAST TECHNIQUE: Multidetector CT imaging of the abdomen and  pelvis was performed using the standard protocol following bolus administration of intravenous contrast. CONTRAST:  136mL ISOVUE-300 IOPAMIDOL (ISOVUE-300) INJECTION 61% COMPARISON:  01/11/2016. FINDINGS: Lower chest: Subsegmental atelectasis identified right lower lobe with compressive atelectasis left lung base and tiny left pleural effusion. Hepatobiliary: No focal abnormality within the liver parenchyma. There is no evidence for gallstones, gallbladder wall thickening, or pericholecystic fluid. No intrahepatic or extrahepatic biliary dilation. Pancreas: No focal mass lesion. No dilatation of the main duct. No intraparenchymal cyst. No peripancreatic edema. Spleen: No splenomegaly. No focal mass lesion. Adrenals/Urinary Tract: No adrenal nodule or mass. Kidneys are unremarkable. No evidence for hydroureter. The urinary bladder appears normal for the degree of distention. Stomach/Bowel: Stomach is nondistended. No gastric wall thickening. No evidence of outlet obstruction. Duodenum is normally positioned as is the ligament of Treitz. No small bowel wall thickening. No small bowel dilatation. The terminal ileum is normal. The appendix is not visualized, but there is no edema or inflammation in the region  of the cecum. No gross colonic mass. No colonic wall thickening. No substantial diverticular change. Vascular/Lymphatic: No abdominal aortic aneurysm. No abdominal aortic atherosclerotic calcification. Portal vein is patent. Paraesophageal varices are evident. Superior mesenteric vein and splenic vein are patent. No evidence for retroperitoneal lymphadenopathy. No pelvic sidewall lymphadenopathy. Reproductive: The uterus has normal CT imaging appearance. There is no adnexal mass. Other: Fluid volume around the liver appears decreased in the interval. Measuring adjacent to the inferior right hepatic lobe, the thickness of the peritoneal disease/ fluid was about 2.3 cm on the prior study which compares to 0.7 cm on  today's exam. Scattered areas of peritoneal thickening/irregularity are again noted. Omental disease appears stable in the interval. The peritoneal thickening and fluid in the cul-de-sac is stable in the interval. Musculoskeletal: Bone windows reveal no worrisome lytic or sclerotic osseous lesions. IMPRESSION: 1. No substantial interval change in exam. There may be slightly less fluid attenuation disease in the upper abdomen, but the degree of peritoneal thickening/irregularity and fluid volume in the pelvis is not appreciably changed in the interval. Electronically Signed   By: Misty Stanley M.D.   On: 05/16/2016 08:59    ASSESSMENT: Peritoneal carcinomatosis, metastatic adenocarcinoma of likely lower GI primary.  PLAN:    1. Peritoneal carcinomatosis: Pathology results reviewed, immunohistochemistry suggested lower GI primary. CT results from May 16, 2016 essentially revealed stable disease. Of note, patient had a normal colonoscopy on October 16, 2014 in [place], [place]. Patient also had second opinion at [facility] that concurs with the plan. Postpone cycle 9 due to platelet count of 78. Will reconsider cycle 9 in one week. Return in one week for repeat labs and reconsideration of cycle 9.  2: Genetic testing: Negative. 3. Anemia: Mild, monitor. 4. Thrombocytopenia: Resolved. 5. Neutropenia: Neulasta with the remainder treatments as above. 6. Peripheral neuropathy: Monitor while receiving oxaliplatin.  Patient expressed understanding and was in agreement with this plan. She also understands that She can call clinic at any time with any questions, concerns, or complaints.   No matching staging information was found for the patient.   Faythe Casa, NP 06/14/2016 12:00 PM   Patient was seen and evaluated independently and I agree with the assessment and plan as dictated above. Plan to reimage at the conclusion of cycle 12.  Lloyd Huger, MD 06/16/16 6:11 PM

## 2016-06-14 ENCOUNTER — Inpatient Hospital Stay: Payer: BLUE CROSS/BLUE SHIELD

## 2016-06-14 ENCOUNTER — Inpatient Hospital Stay: Payer: BLUE CROSS/BLUE SHIELD | Attending: Oncology | Admitting: Oncology

## 2016-06-14 VITALS — BP 104/61 | HR 91 | Temp 97.9°F | Resp 18 | Wt 137.5 lb

## 2016-06-14 DIAGNOSIS — G629 Polyneuropathy, unspecified: Secondary | ICD-10-CM

## 2016-06-14 DIAGNOSIS — Z87891 Personal history of nicotine dependence: Secondary | ICD-10-CM

## 2016-06-14 DIAGNOSIS — R140 Abdominal distension (gaseous): Secondary | ICD-10-CM

## 2016-06-14 DIAGNOSIS — K219 Gastro-esophageal reflux disease without esophagitis: Secondary | ICD-10-CM | POA: Insufficient documentation

## 2016-06-14 DIAGNOSIS — R109 Unspecified abdominal pain: Secondary | ICD-10-CM | POA: Insufficient documentation

## 2016-06-14 DIAGNOSIS — Z5111 Encounter for antineoplastic chemotherapy: Secondary | ICD-10-CM | POA: Insufficient documentation

## 2016-06-14 DIAGNOSIS — D701 Agranulocytosis secondary to cancer chemotherapy: Secondary | ICD-10-CM | POA: Diagnosis not present

## 2016-06-14 DIAGNOSIS — C801 Malignant (primary) neoplasm, unspecified: Secondary | ICD-10-CM | POA: Insufficient documentation

## 2016-06-14 DIAGNOSIS — Z79899 Other long term (current) drug therapy: Secondary | ICD-10-CM

## 2016-06-14 DIAGNOSIS — E785 Hyperlipidemia, unspecified: Secondary | ICD-10-CM | POA: Diagnosis not present

## 2016-06-14 DIAGNOSIS — T451X5S Adverse effect of antineoplastic and immunosuppressive drugs, sequela: Secondary | ICD-10-CM | POA: Insufficient documentation

## 2016-06-14 DIAGNOSIS — Z9221 Personal history of antineoplastic chemotherapy: Secondary | ICD-10-CM

## 2016-06-14 DIAGNOSIS — R110 Nausea: Secondary | ICD-10-CM

## 2016-06-14 DIAGNOSIS — C786 Secondary malignant neoplasm of retroperitoneum and peritoneum: Secondary | ICD-10-CM | POA: Insufficient documentation

## 2016-06-14 DIAGNOSIS — D649 Anemia, unspecified: Secondary | ICD-10-CM | POA: Diagnosis not present

## 2016-06-14 LAB — COMPREHENSIVE METABOLIC PANEL
ALT: 16 U/L (ref 14–54)
AST: 34 U/L (ref 15–41)
Albumin: 3.2 g/dL — ABNORMAL LOW (ref 3.5–5.0)
Alkaline Phosphatase: 156 U/L — ABNORMAL HIGH (ref 38–126)
Anion gap: 8 (ref 5–15)
BILIRUBIN TOTAL: 0.4 mg/dL (ref 0.3–1.2)
BUN: 10 mg/dL (ref 6–20)
CALCIUM: 8.5 mg/dL — AB (ref 8.9–10.3)
CO2: 23 mmol/L (ref 22–32)
CREATININE: 0.66 mg/dL (ref 0.44–1.00)
Chloride: 106 mmol/L (ref 101–111)
GFR calc Af Amer: >60 mL/min (ref >60)
Glucose, Bld: 105 mg/dL — ABNORMAL HIGH (ref 65–99)
POTASSIUM: 3.9 mmol/L (ref 3.5–5.1)
Sodium: 137 mmol/L (ref 135–145)
TOTAL PROTEIN: 7 g/dL (ref 6.5–8.1)

## 2016-06-14 LAB — CBC WITH DIFFERENTIAL/PLATELET
BASOS ABS: 0 10*3/uL (ref 0–0.1)
Basophils Relative: 0 %
Eosinophils Absolute: 0 10*3/uL (ref 0–0.7)
Eosinophils Relative: 0 %
HEMATOCRIT: 31.9 % — AB (ref 35.0–47.0)
Hemoglobin: 10.6 g/dL — ABNORMAL LOW (ref 12.0–16.0)
LYMPHS PCT: 15 %
Lymphs Abs: 1.3 10*3/uL (ref 1.0–3.6)
MCH: 31.7 pg (ref 26.0–34.0)
MCHC: 33.3 g/dL (ref 32.0–36.0)
MCV: 95.3 fL (ref 80.0–100.0)
MONO ABS: 0.8 10*3/uL (ref 0.2–0.9)
Monocytes Relative: 9 %
NEUTROS ABS: 6.4 10*3/uL (ref 1.4–6.5)
Neutrophils Relative %: 76 %
Platelets: 78 10*3/uL — ABNORMAL LOW (ref 150–440)
RBC: 3.34 MIL/uL — ABNORMAL LOW (ref 3.80–5.20)
RDW: 23.6 % — AB (ref 11.5–14.5)
WBC: 8.5 10*3/uL (ref 3.6–11.0)

## 2016-06-14 MED ORDER — HEPARIN SOD (PORK) LOCK FLUSH 100 UNIT/ML IV SOLN
500.0000 [IU] | Freq: Once | INTRAVENOUS | Status: AC
Start: 2016-06-14 — End: 2016-06-14
  Administered 2016-06-14: 500 [IU] via INTRAVENOUS

## 2016-06-14 NOTE — Progress Notes (Signed)
Complains of dryness in nasal passage with intermittent bleeding.

## 2016-06-16 ENCOUNTER — Inpatient Hospital Stay: Payer: BLUE CROSS/BLUE SHIELD

## 2016-06-20 NOTE — Progress Notes (Signed)
[place]  [phone]) [phone] Fax:(336) [phone]  ID: ESMER ECHARD OB: [birth year]  MR#: [id number]  [id number]  Patient Care Team: Valerie Roys, DO as PCP - General (Family Medicine) Clent Jacks, RN as Registered Nurse Garnetta Buddy, MD as Consulting Physician (Internal Medicine)  CHIEF COMPLAINT: Peritoneal carcinomatosis, metastatic adenocarcinoma of likely lower GI primary.  INTERVAL HISTORY: Patient returns to clinic today for further evaluation and reconsideration of cycle 9 of 12 of FOLFOX. She does complain of mild bloating in her abdomen. It is not causing her pain. She has some mild nausea after treatments, but otherwise is tolerating them well. Her neuropathy has improved. She has no neurological complaints.  She denies any recent fevers or illnesses.  She has no chest pain or shortness of breath.  She denies and nausea, vomiting, constipation, or diarrhea. She has no melena or hematochezia. She has no urinary complains.  Patient offers no further specific complaints.  REVIEW OF SYSTEMS:   Review of Systems  Constitutional: Negative for fever, malaise/fatigue and weight loss.  Respiratory: Negative.  Negative for cough and shortness of breath.   Cardiovascular: Negative.  Negative for chest pain.  Gastrointestinal: Negative for abdominal pain, blood in stool, constipation, diarrhea, melena and vomiting.       Positive for abdominal bloating  Genitourinary: Negative.   Musculoskeletal: Negative.   Neurological: Positive for sensory change. Negative for weakness.  Psychiatric/Behavioral: The patient is not nervous/anxious.     As per HPI. Otherwise, a complete review of systems is negative.  PAST MEDICAL HISTORY: Past Medical History:  Diagnosis Date  . Acid reflux   . Allergic rhinitis   . Anxiety   . Arthritis    left knee  . Cancer ([place])   . Cervical spondylosis   . CTS (carpal tunnel syndrome)    Right  . Diverticulosis    . HSV-2 (herpes simplex virus 2) infection   . Hyperlipidemia   . Ovarian failure   . Rosacea   . Wears contact lenses     PAST SURGICAL HISTORY: Past Surgical History:  Procedure Laterality Date  . ANTERIOR CRUCIATE LIGAMENT REPAIR Left   . CESAREAN SECTION    . COLONOSCOPY WITH PROPOFOL N/A 01/29/2016   Procedure: COLONOSCOPY WITH PROPOFOL;  Surgeon: Lucilla Lame, MD;  Location: [facility];  Service: Endoscopy;  Laterality: N/A;  . ESOPHAGOGASTRODUODENOSCOPY (EGD) WITH PROPOFOL N/A 01/29/2016   Procedure: ESOPHAGOGASTRODUODENOSCOPY (EGD) WITH PROPOFOL;  Surgeon: Lucilla Lame, MD;  Location: [facility];  Service: Endoscopy;  Laterality: N/A;  . PERIPHERAL VASCULAR CATHETERIZATION N/A 02/10/2016   Procedure: Glori Luis Cath Insertion;  Surgeon: Algernon Huxley, MD;  Location: [facility];  Service: Cardiovascular;  Laterality: N/A;    FAMILY HISTORY: Unknown.  Patient is adopted.     ADVANCED DIRECTIVES:    HEALTH MAINTENANCE: Social History  Substance Use Topics  . Smoking status: Former Research scientist (life sciences)  . Smokeless tobacco: Never Used     Comment: Quit in her 52's  . Alcohol use 0.0 oz/week     Comment: 1 drink/mo     Colonoscopy:  PAP:  Bone density:  Lipid panel:  No Known Allergies  Current Outpatient Prescriptions  Medication Sig Dispense Refill  . acyclovir (ZOVIRAX) 800 MG tablet Take 1 tablet (800 mg total) by mouth daily. 90 tablet 1  . escitalopram (LEXAPRO) 10 MG tablet TAKE 1 TABLET (10 MG TOTAL) BY MOUTH DAILY. 90 tablet 1  . lidocaine-prilocaine (EMLA) cream Apply  to affected area once 30 g 3  . magic mouthwash SOLN Take 5 mLs by mouth 3 (three) times daily as needed for mouth pain. 240 mL 0  . ondansetron (ZOFRAN) 8 MG tablet Take 1 tablet (8 mg total) by mouth 2 (two) times daily as needed for refractory nausea / vomiting. 30 tablet 1  . pantoprazole (PROTONIX) 40 MG tablet Take 1 tablet (40 mg total) by mouth daily. 30 tablet 2  .  prochlorperazine (COMPAZINE) 10 MG tablet Take 1 tablet (10 mg total) by mouth every 6 (six) hours as needed (Nausea or vomiting). 30 tablet 1   No current facility-administered medications for this visit.    Facility-Administered Medications Ordered in Other Visits  Medication Dose Route Frequency Provider Last Rate Last Dose  . fluorouracil (ADRUCIL) 4,200 mg in sodium chloride 0.9 % 66 mL chemo infusion  2,400 mg/m2 (Treatment Plan Recorded) Intravenous 1 day or 1 dose Lloyd Huger, MD      . fluorouracil (ADRUCIL) chemo injection 700 mg  400 mg/m2 (Treatment Plan Recorded) Intravenous Once Lloyd Huger, MD      . heparin lock flush 100 unit/mL  500 Units Intravenous Once Lloyd Huger, MD      . sodium chloride flush (NS) 0.9 % injection 10 mL  10 mL Intracatheter PRN Lloyd Huger, MD   10 mL at 03/31/16 1144    OBJECTIVE: Vitals:   06/21/16 0906  BP: 112/73  Pulse: 84  Resp: 18  Temp: 97.2 F (36.2 C)     Body mass index is 24.51 kg/m.    ECOG FS:0 - Asymptomatic  General: Well-developed, well-nourished, no acute distress. Eyes: Pink conjunctiva, anicteric sclera. Lungs: Clear to auscultation bilaterally. Heart: Regular rate and rhythm. No rubs, murmurs, or gallops. Abdomen: Soft, nontender, non-distended.  Musculoskeletal: No edema, cyanosis, or clubbing. Neuro: Alert, answering all questions appropriately. Cranial nerves grossly intact. Skin: No rashes or petechiae noted. Psych: Normal affect.   LAB RESULTS:  Lab Results  Component Value Date   NA 136 06/21/2016   K 3.8 06/21/2016   CL 106 06/21/2016   CO2 24 06/21/2016   GLUCOSE 102 (H) 06/21/2016   BUN 12 06/21/2016   CREATININE 0.57 06/21/2016   CALCIUM 8.4 (L) 06/21/2016   PROT 6.9 06/21/2016   ALBUMIN 3.1 (L) 06/21/2016   AST 29 06/21/2016   ALT 14 06/21/2016   ALKPHOS 132 (H) 06/21/2016   BILITOT 0.5 06/21/2016   GFRNONAA >60 06/21/2016   GFRAA >60 06/21/2016    Lab Results    Component Value Date   WBC 6.6 06/21/2016   NEUTROABS 4.7 06/21/2016   HGB 10.7 (L) 06/21/2016   HCT 31.9 (L) 06/21/2016   MCV 95.4 06/21/2016   PLT 147 (L) 06/21/2016   Lab Results  Component Value Date   CA125 86.6 (H) 03/29/2016     STUDIES: No results found.  ASSESSMENT: Peritoneal carcinomatosis, metastatic adenocarcinoma of likely lower GI primary.  PLAN:    1. Peritoneal carcinomatosis: Pathology results reviewed, immunohistochemistry suggested lower GI primary. CT results from May 16, 2016 essentially revealed stable disease. Of note, patient had a normal colonoscopy on October 16, 2014 in [place], [place]. Patient also had second opinion at [facility] that concurs with the plan. Continue with cycle 9 today. Platelet count is much better at 147 this morning. Return in 2 days for pump removal and Neulasta and then in two weeks for labs and consideration of cycle 10.  2:  Genetic testing: Negative. 3. Anemia: Mild, monitor. 4. Thrombocytopenia: Resolving. Better this morning. 5. Neutropenia: Neulasta with the remainder treatments as above. 6. Peripheral neuropathy: Resolved. Monitor while receiving oxaliplatin.  Patient expressed understanding and was in agreement with this plan. She also understands that She can call clinic at any time with any questions, concerns, or complaints.   No matching staging information was found for the patient.   Faythe Casa, NP 06/21/2016 09:00 AM  Patient was seen and evaluated independently and I agree with the assessment and plan as dictated above. Proceed with treatment above. Plan to reimage after cycle 12.  Lloyd Huger, MD 06/21/16 2:43 PM

## 2016-06-21 ENCOUNTER — Inpatient Hospital Stay (HOSPITAL_BASED_OUTPATIENT_CLINIC_OR_DEPARTMENT_OTHER): Payer: BLUE CROSS/BLUE SHIELD | Admitting: Oncology

## 2016-06-21 ENCOUNTER — Inpatient Hospital Stay: Payer: BLUE CROSS/BLUE SHIELD

## 2016-06-21 ENCOUNTER — Other Ambulatory Visit: Payer: BLUE CROSS/BLUE SHIELD

## 2016-06-21 ENCOUNTER — Ambulatory Visit: Payer: BLUE CROSS/BLUE SHIELD

## 2016-06-21 ENCOUNTER — Ambulatory Visit: Payer: BLUE CROSS/BLUE SHIELD | Admitting: Oncology

## 2016-06-21 VITALS — BP 112/73 | HR 84 | Temp 97.2°F | Resp 18 | Wt 140.1 lb

## 2016-06-21 DIAGNOSIS — R140 Abdominal distension (gaseous): Secondary | ICD-10-CM

## 2016-06-21 DIAGNOSIS — C801 Malignant (primary) neoplasm, unspecified: Principal | ICD-10-CM

## 2016-06-21 DIAGNOSIS — C786 Secondary malignant neoplasm of retroperitoneum and peritoneum: Secondary | ICD-10-CM

## 2016-06-21 DIAGNOSIS — Z79899 Other long term (current) drug therapy: Secondary | ICD-10-CM

## 2016-06-21 DIAGNOSIS — D649 Anemia, unspecified: Secondary | ICD-10-CM

## 2016-06-21 DIAGNOSIS — E785 Hyperlipidemia, unspecified: Secondary | ICD-10-CM

## 2016-06-21 DIAGNOSIS — G629 Polyneuropathy, unspecified: Secondary | ICD-10-CM

## 2016-06-21 DIAGNOSIS — D701 Agranulocytosis secondary to cancer chemotherapy: Secondary | ICD-10-CM | POA: Diagnosis not present

## 2016-06-21 DIAGNOSIS — Z9221 Personal history of antineoplastic chemotherapy: Secondary | ICD-10-CM | POA: Diagnosis not present

## 2016-06-21 DIAGNOSIS — R110 Nausea: Secondary | ICD-10-CM

## 2016-06-21 DIAGNOSIS — T451X5S Adverse effect of antineoplastic and immunosuppressive drugs, sequela: Secondary | ICD-10-CM

## 2016-06-21 DIAGNOSIS — Z87891 Personal history of nicotine dependence: Secondary | ICD-10-CM

## 2016-06-21 DIAGNOSIS — K219 Gastro-esophageal reflux disease without esophagitis: Secondary | ICD-10-CM

## 2016-06-21 LAB — COMPREHENSIVE METABOLIC PANEL
ALBUMIN: 3.1 g/dL — AB (ref 3.5–5.0)
ALT: 14 U/L (ref 14–54)
ANION GAP: 6 (ref 5–15)
AST: 29 U/L (ref 15–41)
Alkaline Phosphatase: 132 U/L — ABNORMAL HIGH (ref 38–126)
BUN: 12 mg/dL (ref 6–20)
CO2: 24 mmol/L (ref 22–32)
Calcium: 8.4 mg/dL — ABNORMAL LOW (ref 8.9–10.3)
Chloride: 106 mmol/L (ref 101–111)
Creatinine, Ser: 0.57 mg/dL (ref 0.44–1.00)
GFR calc Af Amer: >60 mL/min (ref >60)
GFR calc non Af Amer: >60 mL/min (ref >60)
GLUCOSE: 102 mg/dL — AB (ref 65–99)
POTASSIUM: 3.8 mmol/L (ref 3.5–5.1)
SODIUM: 136 mmol/L (ref 135–145)
TOTAL PROTEIN: 6.9 g/dL (ref 6.5–8.1)
Total Bilirubin: 0.5 mg/dL (ref 0.3–1.2)

## 2016-06-21 LAB — CBC WITH DIFFERENTIAL/PLATELET
BASOS PCT: 1 %
Basophils Absolute: 0 10*3/uL (ref 0–0.1)
EOS ABS: 0 10*3/uL (ref 0–0.7)
Eosinophils Relative: 0 %
HCT: 31.9 % — ABNORMAL LOW (ref 35.0–47.0)
Hemoglobin: 10.7 g/dL — ABNORMAL LOW (ref 12.0–16.0)
Lymphocytes Relative: 16 %
Lymphs Abs: 1 10*3/uL (ref 1.0–3.6)
MCH: 32.1 pg (ref 26.0–34.0)
MCHC: 33.6 g/dL (ref 32.0–36.0)
MCV: 95.4 fL (ref 80.0–100.0)
MONO ABS: 0.8 10*3/uL (ref 0.2–0.9)
MONOS PCT: 12 %
Neutro Abs: 4.7 10*3/uL (ref 1.4–6.5)
Neutrophils Relative %: 71 %
Platelets: 147 10*3/uL — ABNORMAL LOW (ref 150–440)
RBC: 3.34 MIL/uL — ABNORMAL LOW (ref 3.80–5.20)
RDW: 21.8 % — AB (ref 11.5–14.5)
WBC: 6.6 10*3/uL (ref 3.6–11.0)

## 2016-06-21 MED ORDER — HEPARIN SOD (PORK) LOCK FLUSH 100 UNIT/ML IV SOLN: 500.0000 [IU] | Freq: Once | INTRAVENOUS | Status: DC

## 2016-06-21 MED ORDER — OXALIPLATIN CHEMO INJECTION 100 MG/20ML
77.0000 mg/m2 | Freq: Once | INTRAVENOUS | Status: AC
  Administered 2016-06-21: 135 mg via INTRAVENOUS
  Filled 2016-06-21: qty 20

## 2016-06-21 MED ORDER — FLUOROURACIL CHEMO INJECTION 2.5 GM/50ML
400.0000 mg/m2 | Freq: Once | INTRAVENOUS | Status: AC
  Administered 2016-06-21: 700 mg via INTRAVENOUS
  Filled 2016-06-21: qty 14

## 2016-06-21 MED ORDER — SODIUM CHLORIDE 0.9% FLUSH
10.0000 mL | Freq: Once | INTRAVENOUS | Status: AC
  Administered 2016-06-21: 10 mL via INTRAVENOUS
  Filled 2016-06-21: qty 10

## 2016-06-21 MED ORDER — LEUCOVORIN CALCIUM INJECTION 350 MG
400.0000 mg/m2 | Freq: Once | INTRAMUSCULAR | Status: AC
  Administered 2016-06-21: 700 mg via INTRAVENOUS
  Filled 2016-06-21: qty 35

## 2016-06-21 MED ORDER — DEXAMETHASONE SODIUM PHOSPHATE 10 MG/ML IJ SOLN
10.0000 mg | Freq: Once | INTRAMUSCULAR | Status: AC
  Administered 2016-06-21: 10 mg via INTRAVENOUS
  Filled 2016-06-21: qty 1

## 2016-06-21 MED ORDER — DEXTROSE 5 % IV SOLN
Freq: Once | INTRAVENOUS | Status: AC
  Administered 2016-06-21: 10:00:00 via INTRAVENOUS
  Filled 2016-06-21: qty 1000

## 2016-06-21 MED ORDER — SODIUM CHLORIDE 0.9 % IV SOLN
2400.0000 mg/m2 | INTRAVENOUS | Status: DC
  Administered 2016-06-21: 4200 mg via INTRAVENOUS
  Filled 2016-06-21: qty 84

## 2016-06-21 MED ORDER — PALONOSETRON HCL INJECTION 0.25 MG/5ML
0.2500 mg | Freq: Once | INTRAVENOUS | Status: AC
  Administered 2016-06-21: 0.25 mg via INTRAVENOUS
  Filled 2016-06-21: qty 5

## 2016-06-21 NOTE — Progress Notes (Signed)
Complains of more fluid collecting in abdominal area. Feels more bloated today and having mild back pain.

## 2016-06-23 ENCOUNTER — Inpatient Hospital Stay: Payer: BLUE CROSS/BLUE SHIELD

## 2016-06-23 VITALS — BP 109/72 | HR 85 | Temp 97.0°F | Resp 20

## 2016-06-23 DIAGNOSIS — C786 Secondary malignant neoplasm of retroperitoneum and peritoneum: Secondary | ICD-10-CM | POA: Diagnosis not present

## 2016-06-23 DIAGNOSIS — C801 Malignant (primary) neoplasm, unspecified: Principal | ICD-10-CM

## 2016-06-23 MED ORDER — HEPARIN SOD (PORK) LOCK FLUSH 100 UNIT/ML IV SOLN
500.0000 [IU] | Freq: Once | INTRAVENOUS | Status: AC | PRN
  Administered 2016-06-23: 500 [IU]
  Filled 2016-06-23: qty 5

## 2016-06-23 MED ORDER — SODIUM CHLORIDE 0.9% FLUSH
10.0000 mL | INTRAVENOUS | Status: DC | PRN
  Administered 2016-06-23: 10 mL
  Filled 2016-06-23: qty 10

## 2016-06-23 MED ORDER — PEGFILGRASTIM INJECTION 6 MG/0.6ML ~~LOC~~
6.0000 mg | PREFILLED_SYRINGE | Freq: Once | SUBCUTANEOUS | Status: AC
  Administered 2016-06-23: 6 mg via SUBCUTANEOUS
  Filled 2016-06-23: qty 0.6

## 2016-07-04 NOTE — Progress Notes (Signed)
[facility]  [phone] [phone]) [phone]  ID: Valerie Horton OB: 1961-02-16  MR#: [id number]  [id number]  Patient Care Team: Valerie Roys, DO as PCP - General (Family Medicine) Clent Jacks, RN as Registered Nurse Garnetta Buddy, MD as Consulting Physician (Internal Medicine)  CHIEF COMPLAINT: Peritoneal carcinomatosis, metastatic adenocarcinoma of likely lower GI primary.  INTERVAL HISTORY: Patient returns to clinic today for further evaluation and consideration of cycle 10 of 12 of FOLFOX. She has increased fluid in her abdomen, but no pain. She has some mild nausea after treatments, but otherwise is tolerating them well. Her neuropathy has improved. She has no neurological complaints.  She denies any recent fevers or illnesses.  She has no chest pain or shortness of breath.  She denies and nausea, vomiting, constipation, or diarrhea. She has no melena or hematochezia. She has no urinary complains.  Patient offers no further specific complaints.  REVIEW OF SYSTEMS:   Review of Systems  Constitutional: Negative for fever, malaise/fatigue and weight loss.  Respiratory: Negative.  Negative for cough and shortness of breath.   Cardiovascular: Negative.  Negative for chest pain.  Gastrointestinal: Negative for abdominal pain, blood in stool, constipation, diarrhea, melena and vomiting.       Positive for abdominal bloating  Genitourinary: Negative.   Musculoskeletal: Negative.   Neurological: Positive for sensory change. Negative for weakness.  Psychiatric/Behavioral: The patient is not nervous/anxious.     As per HPI. Otherwise, a complete review of systems is negative.  PAST MEDICAL HISTORY: Past Medical History:  Diagnosis Date  . Acid reflux   . Allergic rhinitis   . Anxiety   . Arthritis    left knee  . Cancer ([place])   . Cervical spondylosis   . CTS (carpal tunnel syndrome)    Right  . Diverticulosis   . HSV-2 (herpes simplex  virus 2) infection   . Hyperlipidemia   . Ovarian failure   . Rosacea   . Wears contact lenses     PAST SURGICAL HISTORY: Past Surgical History:  Procedure Laterality Date  . ANTERIOR CRUCIATE LIGAMENT REPAIR Left   . CESAREAN SECTION    . COLONOSCOPY WITH PROPOFOL N/A 01/29/2016   Procedure: COLONOSCOPY WITH PROPOFOL;  Surgeon: Lucilla Lame, MD;  Location: [facility];  Service: Endoscopy;  Laterality: N/A;  . ESOPHAGOGASTRODUODENOSCOPY (EGD) WITH PROPOFOL N/A 01/29/2016   Procedure: ESOPHAGOGASTRODUODENOSCOPY (EGD) WITH PROPOFOL;  Surgeon: Lucilla Lame, MD;  Location: [facility];  Service: Endoscopy;  Laterality: N/A;  . PERIPHERAL VASCULAR CATHETERIZATION N/A 02/10/2016   Procedure: Glori Luis Cath Insertion;  Surgeon: Algernon Huxley, MD;  Location: [facility];  Service: Cardiovascular;  Laterality: N/A;    FAMILY HISTORY: Unknown.  Patient is adopted.     ADVANCED DIRECTIVES:    HEALTH MAINTENANCE: Social History  Substance Use Topics  . Smoking status: Former Research scientist (life sciences)  . Smokeless tobacco: Never Used     Comment: Quit in her 35's  . Alcohol use 0.0 oz/week     Comment: 1 drink/mo     Colonoscopy:  PAP:  Bone density:  Lipid panel:  No Known Allergies  Current Outpatient Prescriptions  Medication Sig Dispense Refill  . acyclovir (ZOVIRAX) 800 MG tablet Take 1 tablet (800 mg total) by mouth daily. 90 tablet 1  . escitalopram (LEXAPRO) 10 MG tablet TAKE 1 TABLET (10 MG TOTAL) BY MOUTH DAILY. 90 tablet 1  . lidocaine-prilocaine (EMLA) cream Apply to affected area once 30  g 3  . magic mouthwash SOLN Take 5 mLs by mouth 3 (three) times daily as needed for mouth pain. 240 mL 0  . ondansetron (ZOFRAN) 8 MG tablet Take 1 tablet (8 mg total) by mouth 2 (two) times daily as needed for refractory nausea / vomiting. 30 tablet 1  . pantoprazole (PROTONIX) 40 MG tablet Take 1 tablet (40 mg total) by mouth daily. 30 tablet 2  . prochlorperazine (COMPAZINE) 10 MG  tablet Take 1 tablet (10 mg total) by mouth every 6 (six) hours as needed (Nausea or vomiting). 30 tablet 1   No current facility-administered medications for this visit.    Facility-Administered Medications Ordered in Other Visits  Medication Dose Route Frequency Provider Last Rate Last Dose  . fluorouracil (ADRUCIL) 4,200 mg in sodium chloride 0.9 % 66 mL chemo infusion  2,400 mg/m2 (Treatment Plan Recorded) Intravenous 1 day or 1 dose Lloyd Huger, MD      . fluorouracil (ADRUCIL) chemo injection 700 mg  400 mg/m2 (Treatment Plan Recorded) Intravenous Once Lloyd Huger, MD      . heparin lock flush 100 unit/mL  500 Units Intracatheter Once PRN Lloyd Huger, MD      . leucovorin 700 mg in dextrose 5 % 250 mL infusion  400 mg/m2 (Treatment Plan Recorded) Intravenous Once Lloyd Huger, MD 143 mL/hr at 07/05/16 1140 700 mg at 07/05/16 1140  . oxaliplatin (ELOXATIN) 135 mg in dextrose 5 % 500 mL chemo infusion  77 mg/m2 (Treatment Plan Recorded) Intravenous Once Lloyd Huger, MD 264 mL/hr at 07/05/16 1140 135 mg at 07/05/16 1140  . sodium chloride flush (NS) 0.9 % injection 10 mL  10 mL Intracatheter PRN Lloyd Huger, MD   10 mL at 03/31/16 1144  . sodium chloride flush (NS) 0.9 % injection 10 mL  10 mL Intracatheter PRN Lloyd Huger, MD   10 mL at 07/05/16 1115    OBJECTIVE: Vitals:   07/05/16 1004  BP: 115/77  Pulse: 90  Resp: 18  Temp: 97.9 F (36.6 C)     Body mass index is 24.66 kg/m.    ECOG FS:0 - Asymptomatic  General: Well-developed, well-nourished, no acute distress. Eyes: Pink conjunctiva, anicteric sclera. Lungs: Clear to auscultation bilaterally. Heart: Regular rate and rhythm. No rubs, murmurs, or gallops. Abdomen: Mildly distended, nontender.  Musculoskeletal: No edema, cyanosis, or clubbing. Neuro: Alert, answering all questions appropriately. Cranial nerves grossly intact. Skin: No rashes or petechiae noted. Psych: Normal  affect.   LAB RESULTS:  Lab Results  Component Value Date   NA 135 07/05/2016   K 3.5 07/05/2016   CL 106 07/05/2016   CO2 23 07/05/2016   GLUCOSE 107 (H) 07/05/2016   BUN 11 07/05/2016   CREATININE 0.64 07/05/2016   CALCIUM 8.3 (L) 07/05/2016   PROT 6.9 07/05/2016   ALBUMIN 3.3 (L) 07/05/2016   AST 30 07/05/2016   ALT 13 (L) 07/05/2016   ALKPHOS 150 (H) 07/05/2016   BILITOT 0.5 07/05/2016   GFRNONAA >60 07/05/2016   GFRAA >60 07/05/2016    Lab Results  Component Value Date   WBC 9.2 07/05/2016   NEUTROABS 7.2 (H) 07/05/2016   HGB 11.2 (L) 07/05/2016   HCT 33.3 (L) 07/05/2016   MCV 96.8 07/05/2016   PLT 110 (L) 07/05/2016   Lab Results  Component Value Date   CA125 86.6 (H) 03/29/2016     STUDIES: No results found.  ASSESSMENT: Peritoneal carcinomatosis, metastatic adenocarcinoma of likely  lower GI primary.  PLAN:    1. Peritoneal carcinomatosis: Pathology results reviewed, immunohistochemistry suggested lower GI primary. CT results from May 16, 2016 essentially revealed stable disease. Of note, patient had a normal colonoscopy on October 16, 2014 in [place], [place]. Patient also had second opinion at [facility] that concurs with the plan. Proceed with cycle 10 today. Because of increased ascites, will get ultrasound guided paracentesis later this week as well as restage with CT scan. Patient will return to clinic in 1 week to discuss the results and then in 2 weeks for consideration of cycle 11 if current therapy is continued.  2: Genetic testing: Negative. 3. Anemia: Mild, monitor. 4. Thrombocytopenia: Resolved 5. Neutropenia: Neulasta with the remainder treatments as above. 6. Peripheral neuropathy: Resolved. Monitor while receiving oxaliplatin. 7. Ascites: Ultrasound-guided paracentesis as above. Repeat CT scan.  Patient expressed understanding and was in agreement with this plan. She also understands that She can call clinic at any time  with any questions, concerns, or complaints.   No matching staging information was found for the patient.   Lloyd Huger, MD 07/05/16 1:17 PM

## 2016-07-05 ENCOUNTER — Inpatient Hospital Stay: Payer: BLUE CROSS/BLUE SHIELD | Attending: Oncology | Admitting: Oncology

## 2016-07-05 ENCOUNTER — Telehealth: Payer: Self-pay | Admitting: *Deleted

## 2016-07-05 ENCOUNTER — Inpatient Hospital Stay: Payer: BLUE CROSS/BLUE SHIELD

## 2016-07-05 VITALS — BP 115/77 | HR 90 | Temp 97.9°F | Resp 18 | Wt 141.0 lb

## 2016-07-05 DIAGNOSIS — Z79899 Other long term (current) drug therapy: Secondary | ICD-10-CM | POA: Diagnosis not present

## 2016-07-05 DIAGNOSIS — R188 Other ascites: Secondary | ICD-10-CM | POA: Insufficient documentation

## 2016-07-05 DIAGNOSIS — K219 Gastro-esophageal reflux disease without esophagitis: Secondary | ICD-10-CM | POA: Diagnosis not present

## 2016-07-05 DIAGNOSIS — D6959 Other secondary thrombocytopenia: Secondary | ICD-10-CM | POA: Insufficient documentation

## 2016-07-05 DIAGNOSIS — D701 Agranulocytosis secondary to cancer chemotherapy: Secondary | ICD-10-CM | POA: Diagnosis not present

## 2016-07-05 DIAGNOSIS — Z87891 Personal history of nicotine dependence: Secondary | ICD-10-CM

## 2016-07-05 DIAGNOSIS — E785 Hyperlipidemia, unspecified: Secondary | ICD-10-CM

## 2016-07-05 DIAGNOSIS — T451X5S Adverse effect of antineoplastic and immunosuppressive drugs, sequela: Secondary | ICD-10-CM | POA: Insufficient documentation

## 2016-07-05 DIAGNOSIS — C786 Secondary malignant neoplasm of retroperitoneum and peritoneum: Secondary | ICD-10-CM

## 2016-07-05 DIAGNOSIS — D649 Anemia, unspecified: Secondary | ICD-10-CM | POA: Diagnosis not present

## 2016-07-05 DIAGNOSIS — Z5111 Encounter for antineoplastic chemotherapy: Secondary | ICD-10-CM | POA: Diagnosis not present

## 2016-07-05 DIAGNOSIS — R110 Nausea: Secondary | ICD-10-CM

## 2016-07-05 DIAGNOSIS — M1990 Unspecified osteoarthritis, unspecified site: Secondary | ICD-10-CM | POA: Insufficient documentation

## 2016-07-05 DIAGNOSIS — C801 Malignant (primary) neoplasm, unspecified: Secondary | ICD-10-CM

## 2016-07-05 DIAGNOSIS — I8500 Esophageal varices without bleeding: Secondary | ICD-10-CM | POA: Insufficient documentation

## 2016-07-05 LAB — COMPREHENSIVE METABOLIC PANEL
ALBUMIN: 3.3 g/dL — AB (ref 3.5–5.0)
ALT: 13 U/L — AB (ref 14–54)
AST: 30 U/L (ref 15–41)
Alkaline Phosphatase: 150 U/L — ABNORMAL HIGH (ref 38–126)
Anion gap: 6 (ref 5–15)
BILIRUBIN TOTAL: 0.5 mg/dL (ref 0.3–1.2)
BUN: 11 mg/dL (ref 6–20)
CO2: 23 mmol/L (ref 22–32)
CREATININE: 0.64 mg/dL (ref 0.44–1.00)
Calcium: 8.3 mg/dL — ABNORMAL LOW (ref 8.9–10.3)
Chloride: 106 mmol/L (ref 101–111)
GFR calc Af Amer: >60 mL/min (ref >60)
GLUCOSE: 107 mg/dL — AB (ref 65–99)
Potassium: 3.5 mmol/L (ref 3.5–5.1)
Sodium: 135 mmol/L (ref 135–145)
TOTAL PROTEIN: 6.9 g/dL (ref 6.5–8.1)

## 2016-07-05 LAB — CBC WITH DIFFERENTIAL/PLATELET
BASOS ABS: 0 10*3/uL (ref 0–0.1)
Basophils Relative: 1 %
Eosinophils Absolute: 0 10*3/uL (ref 0–0.7)
Eosinophils Relative: 0 %
HEMATOCRIT: 33.3 % — AB (ref 35.0–47.0)
HEMOGLOBIN: 11.2 g/dL — AB (ref 12.0–16.0)
LYMPHS PCT: 13 %
Lymphs Abs: 1.2 10*3/uL (ref 1.0–3.6)
MCH: 32.6 pg (ref 26.0–34.0)
MCHC: 33.7 g/dL (ref 32.0–36.0)
MCV: 96.8 fL (ref 80.0–100.0)
MONO ABS: 0.7 10*3/uL (ref 0.2–0.9)
Monocytes Relative: 7 %
NEUTROS ABS: 7.2 10*3/uL — AB (ref 1.4–6.5)
NEUTROS PCT: 79 %
Platelets: 110 10*3/uL — ABNORMAL LOW (ref 150–440)
RBC: 3.44 MIL/uL — AB (ref 3.80–5.20)
RDW: 19.4 % — ABNORMAL HIGH (ref 11.5–14.5)
WBC: 9.2 10*3/uL (ref 3.6–11.0)

## 2016-07-05 MED ORDER — FLUOROURACIL CHEMO INJECTION 2.5 GM/50ML
400.0000 mg/m2 | Freq: Once | INTRAVENOUS | Status: AC
  Administered 2016-07-05: 700 mg via INTRAVENOUS
  Filled 2016-07-05: qty 14

## 2016-07-05 MED ORDER — SODIUM CHLORIDE 0.9% FLUSH
10.0000 mL | INTRAVENOUS | Status: DC | PRN
  Administered 2016-07-05: 10 mL
  Filled 2016-07-05: qty 10

## 2016-07-05 MED ORDER — SODIUM CHLORIDE 0.9 % IV SOLN: 10.0000 mg | Freq: Once | INTRAVENOUS | Status: DC

## 2016-07-05 MED ORDER — OXALIPLATIN CHEMO INJECTION 100 MG/20ML
77.0000 mg/m2 | Freq: Once | INTRAVENOUS | Status: AC
  Administered 2016-07-05: 135 mg via INTRAVENOUS
  Filled 2016-07-05: qty 20

## 2016-07-05 MED ORDER — DEXTROSE 5 % IV SOLN
400.0000 mg/m2 | Freq: Once | INTRAVENOUS | Status: AC
  Administered 2016-07-05: 700 mg via INTRAVENOUS
  Filled 2016-07-05: qty 35

## 2016-07-05 MED ORDER — SODIUM CHLORIDE 0.9 % IV SOLN
2400.0000 mg/m2 | INTRAVENOUS | Status: DC
  Administered 2016-07-05: 4200 mg via INTRAVENOUS
  Filled 2016-07-05: qty 84

## 2016-07-05 MED ORDER — DEXAMETHASONE SODIUM PHOSPHATE 10 MG/ML IJ SOLN
10.0000 mg | Freq: Once | INTRAMUSCULAR | Status: AC
  Administered 2016-07-05: 10 mg via INTRAVENOUS
  Filled 2016-07-05: qty 1

## 2016-07-05 MED ORDER — PALONOSETRON HCL INJECTION 0.25 MG/5ML
0.2500 mg | Freq: Once | INTRAVENOUS | Status: AC
  Administered 2016-07-05: 0.25 mg via INTRAVENOUS
  Filled 2016-07-05: qty 5

## 2016-07-05 MED ORDER — HEPARIN SOD (PORK) LOCK FLUSH 100 UNIT/ML IV SOLN: 500.0000 [IU] | Freq: Once | INTRAVENOUS | Status: DC | PRN

## 2016-07-05 MED ORDER — DEXTROSE 5 % IV SOLN
Freq: Once | INTRAVENOUS | Status: AC
  Administered 2016-07-05: 11:00:00 via INTRAVENOUS
  Filled 2016-07-05: qty 1000

## 2016-07-05 NOTE — Telephone Encounter (Signed)
Spoke with Rhonda at [facility] pathology department regarding request for additional testing. MMR/MSI to be performed on slides from 01/18/16. Pathologist at [facility] will enter orders for additional testing.  

## 2016-07-05 NOTE — Progress Notes (Signed)
Complains of more fluid in abdomen. Pt having more discomfort in abdomen and would like to discuss if treatment is working since fluid has accumulated.

## 2016-07-06 ENCOUNTER — Ambulatory Visit
Admission: RE | Admit: 2016-07-06 | Discharge: 2016-07-06 | Disposition: A | Discharge status: Home / Self Care | Payer: BLUE CROSS/BLUE SHIELD | Source: Ambulatory Visit | Attending: Oncology | Admitting: Oncology

## 2016-07-06 DIAGNOSIS — C786 Secondary malignant neoplasm of retroperitoneum and peritoneum: Secondary | ICD-10-CM | POA: Diagnosis present

## 2016-07-06 DIAGNOSIS — C801 Malignant (primary) neoplasm, unspecified: Secondary | ICD-10-CM | POA: Insufficient documentation

## 2016-07-06 NOTE — Procedures (Signed)
Paracentesis without difficulty  Complications:  None  Blood Loss: none  See dictation in canopy pacs  

## 2016-07-07 ENCOUNTER — Inpatient Hospital Stay: Payer: BLUE CROSS/BLUE SHIELD

## 2016-07-07 VITALS — BP 115/55 | HR 96 | Temp 96.6°F | Resp 18

## 2016-07-07 DIAGNOSIS — C786 Secondary malignant neoplasm of retroperitoneum and peritoneum: Secondary | ICD-10-CM

## 2016-07-07 DIAGNOSIS — C801 Malignant (primary) neoplasm, unspecified: Principal | ICD-10-CM

## 2016-07-07 MED ORDER — SODIUM CHLORIDE 0.9% FLUSH
10.0000 mL | INTRAVENOUS | Status: DC | PRN
  Administered 2016-07-07: 10 mL
  Filled 2016-07-07: qty 10

## 2016-07-07 MED ORDER — HEPARIN SOD (PORK) LOCK FLUSH 100 UNIT/ML IV SOLN
500.0000 [IU] | Freq: Once | INTRAVENOUS | Status: AC | PRN
  Administered 2016-07-07: 500 [IU]
  Filled 2016-07-07: qty 5

## 2016-07-07 MED ORDER — PEGFILGRASTIM INJECTION 6 MG/0.6ML ~~LOC~~
6.0000 mg | PREFILLED_SYRINGE | Freq: Once | SUBCUTANEOUS | Status: AC
  Administered 2016-07-07: 6 mg via SUBCUTANEOUS
  Filled 2016-07-07: qty 0.6

## 2016-07-11 ENCOUNTER — Other Ambulatory Visit: Payer: Self-pay | Admitting: Oncology

## 2016-07-11 ENCOUNTER — Ambulatory Visit
Admission: RE | Admit: 2016-07-11 | Discharge: 2016-07-11 | Disposition: A | Discharge status: Home / Self Care | Payer: BLUE CROSS/BLUE SHIELD | Source: Ambulatory Visit | Attending: Oncology | Admitting: Oncology

## 2016-07-11 DIAGNOSIS — I8500 Esophageal varices without bleeding: Secondary | ICD-10-CM | POA: Diagnosis not present

## 2016-07-11 DIAGNOSIS — C786 Secondary malignant neoplasm of retroperitoneum and peritoneum: Secondary | ICD-10-CM | POA: Insufficient documentation

## 2016-07-11 DIAGNOSIS — R188 Other ascites: Secondary | ICD-10-CM | POA: Diagnosis not present

## 2016-07-11 DIAGNOSIS — C801 Malignant (primary) neoplasm, unspecified: Secondary | ICD-10-CM | POA: Diagnosis present

## 2016-07-11 LAB — CYTOLOGY - NON PAP

## 2016-07-11 MED ORDER — IOPAMIDOL (ISOVUE-300) INJECTION 61%
85.0000 mL | Freq: Once | INTRAVENOUS | Status: AC | PRN
  Administered 2016-07-11: 85 mL via INTRAVENOUS

## 2016-07-11 NOTE — Progress Notes (Signed)
[facility]  [phone]) [phone] [phone]) [phone]  ID: Valerie Horton OB: 15-Dec-1960  MR#: [id number]  [id number]  Patient Care Team: Valerie Roys, DO as PCP - General (Family Medicine) Clent Jacks, RN as Registered Nurse Garnetta Buddy, MD as Consulting Physician (Internal Medicine)  CHIEF COMPLAINT: Peritoneal carcinomatosis, metastatic adenocarcinoma of likely lower GI primary.  INTERVAL HISTORY: Patient returns to clinic today for further evaluation and discussion of her imaging results. She feels significantly improved after having a paracentesis removing 3.1 L of ascitic fluid. She has no neurological complaints.  She denies any recent fevers or illnesses.  She has no chest pain or shortness of breath.  She denies and nausea, vomiting, constipation, or diarrhea. She has no melena or hematochezia. She has no urinary complains.  Patient offers no further specific complaints.  REVIEW OF SYSTEMS:   Review of Systems  Constitutional: Negative for fever, malaise/fatigue and weight loss.  Respiratory: Negative.  Negative for cough and shortness of breath.   Cardiovascular: Negative.  Negative for chest pain.  Gastrointestinal: Negative for abdominal pain, blood in stool, constipation, diarrhea, melena and vomiting.       Positive for abdominal bloating  Genitourinary: Negative.   Musculoskeletal: Negative.   Neurological: Positive for sensory change. Negative for weakness.  Psychiatric/Behavioral: The patient is not nervous/anxious.     As per HPI. Otherwise, a complete review of systems is negative.  PAST MEDICAL HISTORY: Past Medical History:  Diagnosis Date  . Acid reflux   . Allergic rhinitis   . Anxiety   . Arthritis    left knee  . Cancer ([place])   . Cervical spondylosis   . CTS (carpal tunnel syndrome)    Right  . Diverticulosis   . HSV-2 (herpes simplex virus 2) infection   . Hyperlipidemia   . Ovarian failure   . Rosacea   .  Wears contact lenses     PAST SURGICAL HISTORY: Past Surgical History:  Procedure Laterality Date  . ANTERIOR CRUCIATE LIGAMENT REPAIR Left   . CESAREAN SECTION    . COLONOSCOPY WITH PROPOFOL N/A 01/29/2016   Procedure: COLONOSCOPY WITH PROPOFOL;  Surgeon: Lucilla Lame, MD;  Location: [facility];  Service: Endoscopy;  Laterality: N/A;  . ESOPHAGOGASTRODUODENOSCOPY (EGD) WITH PROPOFOL N/A 01/29/2016   Procedure: ESOPHAGOGASTRODUODENOSCOPY (EGD) WITH PROPOFOL;  Surgeon: Lucilla Lame, MD;  Location: [facility];  Service: Endoscopy;  Laterality: N/A;  . PERIPHERAL VASCULAR CATHETERIZATION N/A 02/10/2016   Procedure: Glori Luis Cath Insertion;  Surgeon: Algernon Huxley, MD;  Location: [facility];  Service: Cardiovascular;  Laterality: N/A;    FAMILY HISTORY: Unknown.  Patient is adopted.     ADVANCED DIRECTIVES:    HEALTH MAINTENANCE: Social History  Substance Use Topics  . Smoking status: Former Research scientist (life sciences)  . Smokeless tobacco: Never Used     Comment: Quit in her 65's  . Alcohol use 0.0 oz/week     Comment: 1 drink/mo     Colonoscopy:  PAP:  Bone density:  Lipid panel:  No Known Allergies  Current Outpatient Prescriptions  Medication Sig Dispense Refill  . acyclovir (ZOVIRAX) 800 MG tablet Take 1 tablet (800 mg total) by mouth daily. 90 tablet 1  . escitalopram (LEXAPRO) 10 MG tablet TAKE 1 TABLET (10 MG TOTAL) BY MOUTH DAILY. 90 tablet 1  . lidocaine-prilocaine (EMLA) cream Apply to affected area once 30 g 3  . magic mouthwash SOLN Take 5 mLs by mouth 3 (three) times daily  as needed for mouth pain. 240 mL 0  . ondansetron (ZOFRAN) 8 MG tablet Take 1 tablet (8 mg total) by mouth 2 (two) times daily as needed for refractory nausea / vomiting. 30 tablet 1  . pantoprazole (PROTONIX) 40 MG tablet Take 1 tablet (40 mg total) by mouth daily. 30 tablet 2  . prochlorperazine (COMPAZINE) 10 MG tablet Take 1 tablet (10 mg total) by mouth every 6 (six) hours as needed (Nausea  or vomiting). 30 tablet 1   No current facility-administered medications for this visit.    Facility-Administered Medications Ordered in Other Visits  Medication Dose Route Frequency Provider Last Rate Last Dose  . sodium chloride flush (NS) 0.9 % injection 10 mL  10 mL Intracatheter PRN Lloyd Huger, MD   10 mL at 03/31/16 1144    OBJECTIVE: Vitals:   07/12/16 1113  BP: 113/79  Pulse: 96  Resp: 18  Temp: 98.5 F (36.9 C)     Body mass index is 24.24 kg/m.    ECOG FS:0 - Asymptomatic  General: Well-developed, well-nourished, no acute distress. Eyes: Pink conjunctiva, anicteric sclera. Lungs: Clear to auscultation bilaterally. Heart: Regular rate and rhythm. No rubs, murmurs, or gallops. Abdomen: Mildly distended, nontender.  Musculoskeletal: No edema, cyanosis, or clubbing. Neuro: Alert, answering all questions appropriately. Cranial nerves grossly intact. Skin: No rashes or petechiae noted. Psych: Normal affect.   LAB RESULTS:  Lab Results  Component Value Date   NA 135 07/05/2016   K 3.5 07/05/2016   CL 106 07/05/2016   CO2 23 07/05/2016   GLUCOSE 107 (H) 07/05/2016   BUN 11 07/05/2016   CREATININE 0.64 07/05/2016   CALCIUM 8.3 (L) 07/05/2016   PROT 6.9 07/05/2016   ALBUMIN 3.3 (L) 07/05/2016   AST 30 07/05/2016   ALT 13 (L) 07/05/2016   ALKPHOS 150 (H) 07/05/2016   BILITOT 0.5 07/05/2016   GFRNONAA >60 07/05/2016   GFRAA >60 07/05/2016    Lab Results  Component Value Date   WBC 9.2 07/05/2016   NEUTROABS 7.2 (H) 07/05/2016   HGB 11.2 (L) 07/05/2016   HCT 33.3 (L) 07/05/2016   MCV 96.8 07/05/2016   PLT 110 (L) 07/05/2016   Lab Results  Component Value Date   CA125 86.6 (H) 03/29/2016     STUDIES: Ct Abdomen Pelvis W Contrast  Result Date: 07/11/2016 CLINICAL DATA:  Restaging peritoneal carcinomatosis. EXAM: CT ABDOMEN AND PELVIS WITH CONTRAST TECHNIQUE: Multidetector CT imaging of the abdomen and pelvis was performed using the standard  protocol following bolus administration of intravenous contrast. CONTRAST:  36mL ISOVUE-300 IOPAMIDOL (ISOVUE-300) INJECTION 61% COMPARISON:  None. FINDINGS: Lower chest: No acute abnormality. Hepatobiliary: 6 mm calcified implant along the dome of liver is identified and appears unchanged, image 62 of series 6. Contour the liver is slightly irregular. Hypertrophy of the caudate lobe and lateral segment of left lobe of liver noted. The gallbladder appears normal. No biliary dilatation. Pancreas: Unremarkable. No pancreatic ductal dilatation or surrounding inflammatory changes. Spleen: Prominent spleen measures 11.5 cm in length. No focal splenic abnormality. Adrenals/Urinary Tract: Adrenal glands are unremarkable. Kidneys are normal, without renal calculi, focal lesion, or hydronephrosis. Bladder is unremarkable. Stomach/Bowel: Stomach is within normal limits. Appendix appears normal. No evidence of bowel wall thickening, distention, or inflammatory changes. Vascular/Lymphatic: Small esophageal varices identified, image 16 of series 2. Normal appearance of the abdominal aorta. The portal vein appears patent. No upper abdominal or pelvic adenopathy. No inguinal adenopathy. Reproductive: Uterus and bilateral adnexa are unremarkable.  Other: There is a moderate to marked volume of ascites identified within the abdomen and pelvis. Fluid volume around the liver appears increased from previous exam. This currently measures 2.4 cm in thickness, image 38 of series 2. Previously 0.7 cm. Soft tissue infiltration within the omentum is again noted and appears similar to previous exam. Similar appearance of peritoneal thickening and fluid in the cul-de-sac. Musculoskeletal: No acute or significant osseous findings. IMPRESSION: 1. Increase in volume of abdominal ascites compared with previous exam. 2. Similar appearance of soft tissue infiltration within the omental fat as well as peritoneal thickening/enhancement. 3. No evidence  for bowel obstruction obstructive uropathy. 4. Morphologic features a liver are noted suggestive of early cirrhosis. Additionally, there are small esophageal varices identified. Electronically Signed   By: Kerby Moors M.D.   On: 07/11/2016 11:35   US Paracentesis  Result Date: 07/06/2016 INDICATION: Ascites and history of peritoneal carcinomatosis EXAM: ULTRASOUND GUIDED  PARACENTESIS MEDICATIONS: None. COMPLICATIONS: None immediate. PROCEDURE: Informed written consent was obtained from the patient after a discussion of the risks, benefits and alternatives to treatment. A timeout was performed prior to the initiation of the procedure. Initial ultrasound scanning demonstrates a moderate amount of ascites within the abdomen. The right abdomen was prepped and draped in the usual sterile fashion. 1% lidocaine with epinephrine was used for local anesthesia. Following this, a 6 Fr Safe-T-Centesis catheter was introduced. An ultrasound image was saved for documentation purposes. The paracentesis was performed. The catheter was removed and a dressing was applied. The patient tolerated the procedure well without immediate post procedural complication. FINDINGS: A total of approximately 3.1 L of clear yellow fluid was removed. Samples were sent to the laboratory as requested by the clinical team. IMPRESSION: Successful ultrasound-guided paracentesis yielding 3.1 liters of peritoneal fluid. Electronically Signed   By: Inez Catalina M.D.   On: 07/06/2016 14:20    ASSESSMENT: Peritoneal carcinomatosis, metastatic adenocarcinoma of likely lower GI primary.  PLAN:    1. Peritoneal carcinomatosis: Pathology results reviewed, immunohistochemistry suggested lower GI primary. CT results from July 11, 2016 essentially revealed stable disease. No malignant cells were seen on the ascitic fluid.  Of note, patient had a normal colonoscopy on October 16, 2014 in [place], [place]. Patient also had second opinion at [facility] that concurs with the plan. Return to clinic as previously scheduled in 1 week for consideration of cycle 11. Patient is considering a chemotherapy holiday at the conclusion of cycle 12. 2: Genetic testing: Negative. 3. Anemia: Mild, monitor. 4. Thrombocytopenia: Resolved 5. Neutropenia: Neulasta with the remainder treatments as above. 6. Peripheral neuropathy: Resolved. Monitor while receiving oxaliplatin. 7. Ascites: Ultrasound-guided paracentesis as above. No malignant cells seen and fluid.   Patient expressed understanding and was in agreement with this plan. She also understands that She can call clinic at any time with any questions, concerns, or complaints.   No matching staging information was found for the patient.   Lloyd Huger, MD 07/13/16 9:34 AM

## 2016-07-12 ENCOUNTER — Inpatient Hospital Stay (HOSPITAL_BASED_OUTPATIENT_CLINIC_OR_DEPARTMENT_OTHER): Payer: BLUE CROSS/BLUE SHIELD | Admitting: Oncology

## 2016-07-12 VITALS — BP 113/79 | HR 96 | Temp 98.5°F | Resp 18 | Wt 138.6 lb

## 2016-07-12 DIAGNOSIS — D649 Anemia, unspecified: Secondary | ICD-10-CM | POA: Diagnosis not present

## 2016-07-12 DIAGNOSIS — K219 Gastro-esophageal reflux disease without esophagitis: Secondary | ICD-10-CM

## 2016-07-12 DIAGNOSIS — T451X5S Adverse effect of antineoplastic and immunosuppressive drugs, sequela: Secondary | ICD-10-CM

## 2016-07-12 DIAGNOSIS — Z79899 Other long term (current) drug therapy: Secondary | ICD-10-CM

## 2016-07-12 DIAGNOSIS — R188 Other ascites: Secondary | ICD-10-CM

## 2016-07-12 DIAGNOSIS — D701 Agranulocytosis secondary to cancer chemotherapy: Secondary | ICD-10-CM | POA: Diagnosis not present

## 2016-07-12 DIAGNOSIS — C801 Malignant (primary) neoplasm, unspecified: Secondary | ICD-10-CM | POA: Diagnosis not present

## 2016-07-12 DIAGNOSIS — R110 Nausea: Secondary | ICD-10-CM

## 2016-07-12 DIAGNOSIS — C786 Secondary malignant neoplasm of retroperitoneum and peritoneum: Secondary | ICD-10-CM | POA: Diagnosis not present

## 2016-07-12 DIAGNOSIS — E785 Hyperlipidemia, unspecified: Secondary | ICD-10-CM

## 2016-07-12 DIAGNOSIS — Z87891 Personal history of nicotine dependence: Secondary | ICD-10-CM

## 2016-07-12 NOTE — Progress Notes (Signed)
Patient is here for follow up, she is doing well 

## 2016-07-18 NOTE — Progress Notes (Signed)
[place]  [phone] [phone]  ID: Valerie Horton OB: [birth year]  MR#: [id number]  [id number]  Patient Care Team: Valerie Roys, DO as PCP - General (Family Medicine) Clent Jacks, RN as Registered Nurse Garnetta Buddy, MD as Consulting Physician (Internal Medicine)  CHIEF COMPLAINT: Peritoneal carcinomatosis, metastatic adenocarcinoma of likely lower GI primary.  INTERVAL HISTORY: Patient returns to clinic today for further evaluation and consideration of cycle 11 of FOLFOX. Her ascitic fluid has rapidly returned and patient once again feels bloated. She denies any abdominal pain. She has no neurological complaints. She denies any recent fevers or illnesses.  She has no chest pain or shortness of breath.  She denies and nausea, vomiting, constipation, or diarrhea. She has no melena or hematochezia. She has no urinary complains.  Patient offers no further specific complaints.  REVIEW OF SYSTEMS:   Review of Systems  Constitutional: Negative for fever, malaise/fatigue and weight loss.  Respiratory: Negative.  Negative for cough and shortness of breath.   Cardiovascular: Negative.  Negative for chest pain.  Gastrointestinal: Negative for abdominal pain, blood in stool, constipation, diarrhea, melena and vomiting.       Positive for abdominal bloating  Genitourinary: Negative.   Musculoskeletal: Negative.   Neurological: Positive for sensory change. Negative for weakness.  Psychiatric/Behavioral: The patient is not nervous/anxious.     As per HPI. Otherwise, a complete review of systems is negative.  PAST MEDICAL HISTORY: Past Medical History:  Diagnosis Date  . Acid reflux   . Allergic rhinitis   . Anxiety   . Arthritis    left knee  . Cancer ([place])   . Cervical spondylosis   . CTS (carpal tunnel syndrome)    Right  . Diverticulosis   . HSV-2 (herpes simplex virus 2) infection   . Hyperlipidemia   . Ovarian failure     . Rosacea   . Wears contact lenses     PAST SURGICAL HISTORY: Past Surgical History:  Procedure Laterality Date  . ANTERIOR CRUCIATE LIGAMENT REPAIR Left   . CESAREAN SECTION    . COLONOSCOPY WITH PROPOFOL N/A 01/29/2016   Procedure: COLONOSCOPY WITH PROPOFOL;  Surgeon: Lucilla Lame, MD;  Location: [facility];  Service: Endoscopy;  Laterality: N/A;  . ESOPHAGOGASTRODUODENOSCOPY (EGD) WITH PROPOFOL N/A 01/29/2016   Procedure: ESOPHAGOGASTRODUODENOSCOPY (EGD) WITH PROPOFOL;  Surgeon: Lucilla Lame, MD;  Location: [facility];  Service: Endoscopy;  Laterality: N/A;  . PERIPHERAL VASCULAR CATHETERIZATION N/A 02/10/2016   Procedure: Glori Luis Cath Insertion;  Surgeon: Algernon Huxley, MD;  Location: [facility];  Service: Cardiovascular;  Laterality: N/A;    FAMILY HISTORY: Unknown.  Patient is adopted.     ADVANCED DIRECTIVES:    HEALTH MAINTENANCE: Social History  Substance Use Topics  . Smoking status: Former Research scientist (life sciences)  . Smokeless tobacco: Never Used     Comment: Quit in her 65's  . Alcohol use 0.0 oz/week     Comment: 1 drink/mo     Colonoscopy:  PAP:  Bone density:  Lipid panel:  No Known Allergies  Current Outpatient Prescriptions  Medication Sig Dispense Refill  . acyclovir (ZOVIRAX) 800 MG tablet Take 1 tablet (800 mg total) by mouth daily. 90 tablet 1  . escitalopram (LEXAPRO) 10 MG tablet TAKE 1 TABLET (10 MG TOTAL) BY MOUTH DAILY. 90 tablet 1  . lidocaine-prilocaine (EMLA) cream Apply to affected area once 30 g 3  . magic mouthwash SOLN Take 5 mLs by mouth  3 (three) times daily as needed for mouth pain. 240 mL 0  . ondansetron (ZOFRAN) 8 MG tablet Take 1 tablet (8 mg total) by mouth 2 (two) times daily as needed for refractory nausea / vomiting. 30 tablet 1  . pantoprazole (PROTONIX) 40 MG tablet Take 1 tablet (40 mg total) by mouth daily. 30 tablet 2  . prochlorperazine (COMPAZINE) 10 MG tablet Take 1 tablet (10 mg total) by mouth every 6 (six) hours  as needed (Nausea or vomiting). 30 tablet 1   No current facility-administered medications for this visit.    Facility-Administered Medications Ordered in Other Visits  Medication Dose Route Frequency Provider Last Rate Last Dose  . fluorouracil (ADRUCIL) 4,200 mg in sodium chloride 0.9 % 66 mL chemo infusion  2,400 mg/m2 (Treatment Plan Recorded) Intravenous 1 day or 1 dose Lloyd Huger, MD      . fluorouracil (ADRUCIL) chemo injection 700 mg  400 mg/m2 (Treatment Plan Recorded) Intravenous Once Lloyd Huger, MD      . heparin lock flush 100 unit/mL  500 Units Intravenous Once Lloyd Huger, MD      . leucovorin 700 mg in dextrose 5 % 250 mL infusion  400 mg/m2 (Treatment Plan Recorded) Intravenous Once Lloyd Huger, MD 143 mL/hr at 07/19/16 1133 700 mg at 07/19/16 1133  . oxaliplatin (ELOXATIN) 135 mg in dextrose 5 % 500 mL chemo infusion  77 mg/m2 (Treatment Plan Recorded) Intravenous Once Lloyd Huger, MD 264 mL/hr at 07/19/16 1133 135 mg at 07/19/16 1133  . sodium chloride flush (NS) 0.9 % injection 10 mL  10 mL Intracatheter PRN Lloyd Huger, MD   10 mL at 03/31/16 1144  . sodium chloride flush (NS) 0.9 % injection 10 mL  10 mL Intravenous PRN Lloyd Huger, MD   10 mL at 07/19/16 0853    OBJECTIVE: Vitals:   07/19/16 0917  BP: 108/63  Pulse: 84  Resp: 18  Temp: 97.8 F (36.6 C)     Body mass index is 24.8 kg/m.    ECOG FS:0 - Asymptomatic  General: Well-developed, well-nourished, no acute distress. Eyes: Pink conjunctiva, anicteric sclera. Lungs: Clear to auscultation bilaterally. Heart: Regular rate and rhythm. No rubs, murmurs, or gallops. Abdomen: Distended, nontender.  Musculoskeletal: No edema, cyanosis, or clubbing. Neuro: Alert, answering all questions appropriately. Cranial nerves grossly intact. Skin: No rashes or petechiae noted. Psych: Normal affect.   LAB RESULTS:  Lab Results  Component Value Date   NA 135 07/19/2016    K 3.7 07/19/2016   CL 106 07/19/2016   CO2 23 07/19/2016   GLUCOSE 107 (H) 07/19/2016   BUN 11 07/19/2016   CREATININE 0.62 07/19/2016   CALCIUM 8.3 (L) 07/19/2016   PROT 6.3 (L) 07/19/2016   ALBUMIN 2.8 (L) 07/19/2016   AST 29 07/19/2016   ALT 13 (L) 07/19/2016   ALKPHOS 141 (H) 07/19/2016   BILITOT 0.5 07/19/2016   GFRNONAA >60 07/19/2016   GFRAA >60 07/19/2016    Lab Results  Component Value Date   WBC 6.7 07/19/2016   NEUTROABS 5.0 07/19/2016   HGB 10.5 (L) 07/19/2016   HCT 31.1 (L) 07/19/2016   MCV 98.0 07/19/2016   PLT 77 (L) 07/19/2016   Lab Results  Component Value Date   CA125 86.6 (H) 03/29/2016     STUDIES: Ct Abdomen Pelvis W Contrast  Result Date: 07/11/2016 CLINICAL DATA:  Restaging peritoneal carcinomatosis. EXAM: CT ABDOMEN AND PELVIS WITH CONTRAST TECHNIQUE: Multidetector CT  imaging of the abdomen and pelvis was performed using the standard protocol following bolus administration of intravenous contrast. CONTRAST:  3mL ISOVUE-300 IOPAMIDOL (ISOVUE-300) INJECTION 61% COMPARISON:  None. FINDINGS: Lower chest: No acute abnormality. Hepatobiliary: 6 mm calcified implant along the dome of liver is identified and appears unchanged, image 62 of series 6. Contour the liver is slightly irregular. Hypertrophy of the caudate lobe and lateral segment of left lobe of liver noted. The gallbladder appears normal. No biliary dilatation. Pancreas: Unremarkable. No pancreatic ductal dilatation or surrounding inflammatory changes. Spleen: Prominent spleen measures 11.5 cm in length. No focal splenic abnormality. Adrenals/Urinary Tract: Adrenal glands are unremarkable. Kidneys are normal, without renal calculi, focal lesion, or hydronephrosis. Bladder is unremarkable. Stomach/Bowel: Stomach is within normal limits. Appendix appears normal. No evidence of bowel wall thickening, distention, or inflammatory changes. Vascular/Lymphatic: Small esophageal varices identified, image 16 of  series 2. Normal appearance of the abdominal aorta. The portal vein appears patent. No upper abdominal or pelvic adenopathy. No inguinal adenopathy. Reproductive: Uterus and bilateral adnexa are unremarkable. Other: There is a moderate to marked volume of ascites identified within the abdomen and pelvis. Fluid volume around the liver appears increased from previous exam. This currently measures 2.4 cm in thickness, image 38 of series 2. Previously 0.7 cm. Soft tissue infiltration within the omentum is again noted and appears similar to previous exam. Similar appearance of peritoneal thickening and fluid in the cul-de-sac. Musculoskeletal: No acute or significant osseous findings. IMPRESSION: 1. Increase in volume of abdominal ascites compared with previous exam. 2. Similar appearance of soft tissue infiltration within the omental fat as well as peritoneal thickening/enhancement. 3. No evidence for bowel obstruction obstructive uropathy. 4. Morphologic features a liver are noted suggestive of early cirrhosis. Additionally, there are small esophageal varices identified. Electronically Signed   By: Kerby Moors M.D.   On: 07/11/2016 11:35   US Paracentesis  Result Date: 07/06/2016 INDICATION: Ascites and history of peritoneal carcinomatosis EXAM: ULTRASOUND GUIDED  PARACENTESIS MEDICATIONS: None. COMPLICATIONS: None immediate. PROCEDURE: Informed written consent was obtained from the patient after a discussion of the risks, benefits and alternatives to treatment. A timeout was performed prior to the initiation of the procedure. Initial ultrasound scanning demonstrates a moderate amount of ascites within the abdomen. The right abdomen was prepped and draped in the usual sterile fashion. 1% lidocaine with epinephrine was used for local anesthesia. Following this, a 6 Fr Safe-T-Centesis catheter was introduced. An ultrasound image was saved for documentation purposes. The paracentesis was performed. The catheter was  removed and a dressing was applied. The patient tolerated the procedure well without immediate post procedural complication. FINDINGS: A total of approximately 3.1 L of clear yellow fluid was removed. Samples were sent to the laboratory as requested by the clinical team. IMPRESSION: Successful ultrasound-guided paracentesis yielding 3.1 liters of peritoneal fluid. Electronically Signed   By: Inez Catalina M.D.   On: 07/06/2016 14:20    ASSESSMENT: Peritoneal carcinomatosis, metastatic adenocarcinoma of likely lower GI primary.  PLAN:    1. Peritoneal carcinomatosis: Pathology results reviewed, immunohistochemistry suggested lower GI primary. CT results from July 11, 2016 essentially revealed stable disease. No malignant cells were seen on the ascitic fluid.  Although, with reaccumulation of fluid rapidly there is concern of progression of disease.  Of note, patient had a normal colonoscopy on October 16, 2014 in [place], [place]. Patient also had second opinion at [facility] that concurs with the plan. Despite concern of progression, will proceed with cycle  11 of FOLFOX today. Plan to discuss case further with Dr. Karmen Stabs at [facility] in the next several days. Return to clinic in 2 days for pump removal and then in 2 weeks for consideration of cycle 12. Patient is considering a chemotherapy holiday at the conclusion of cycle 12. 2: Genetic testing: Negative. 3. Anemia: Mild, monitor. 4. Thrombocytopenia: Proceed with treatment as above. 5. Neutropenia: Neulasta with the remainder treatments as above. 6. Peripheral neuropathy: Resolved. Monitor while receiving oxaliplatin. 7. Ascites: Repeat ultrasound-guided paracentesis.  Patient expressed understanding and was in agreement with this plan. She also understands that She can call clinic at any time with any questions, concerns, or complaints.   No matching staging information was found for the patient.   Lloyd Huger,  MD 07/19/16 11:41 AM

## 2016-07-19 ENCOUNTER — Inpatient Hospital Stay: Payer: BLUE CROSS/BLUE SHIELD

## 2016-07-19 ENCOUNTER — Ambulatory Visit
Admission: RE | Admit: 2016-07-19 | Discharge: 2016-07-19 | Disposition: A | Discharge status: Home / Self Care | Payer: BLUE CROSS/BLUE SHIELD | Source: Ambulatory Visit | Attending: Oncology | Admitting: Oncology

## 2016-07-19 ENCOUNTER — Inpatient Hospital Stay (HOSPITAL_BASED_OUTPATIENT_CLINIC_OR_DEPARTMENT_OTHER): Payer: BLUE CROSS/BLUE SHIELD | Admitting: Oncology

## 2016-07-19 VITALS — BP 108/63 | HR 84 | Temp 97.8°F | Resp 18 | Wt 141.8 lb

## 2016-07-19 DIAGNOSIS — I8500 Esophageal varices without bleeding: Secondary | ICD-10-CM

## 2016-07-19 DIAGNOSIS — E785 Hyperlipidemia, unspecified: Secondary | ICD-10-CM

## 2016-07-19 DIAGNOSIS — C801 Malignant (primary) neoplasm, unspecified: Secondary | ICD-10-CM | POA: Insufficient documentation

## 2016-07-19 DIAGNOSIS — R188 Other ascites: Secondary | ICD-10-CM

## 2016-07-19 DIAGNOSIS — Z87891 Personal history of nicotine dependence: Secondary | ICD-10-CM

## 2016-07-19 DIAGNOSIS — C786 Secondary malignant neoplasm of retroperitoneum and peritoneum: Secondary | ICD-10-CM | POA: Diagnosis not present

## 2016-07-19 DIAGNOSIS — D701 Agranulocytosis secondary to cancer chemotherapy: Secondary | ICD-10-CM | POA: Diagnosis not present

## 2016-07-19 DIAGNOSIS — R110 Nausea: Secondary | ICD-10-CM

## 2016-07-19 DIAGNOSIS — D6959 Other secondary thrombocytopenia: Secondary | ICD-10-CM

## 2016-07-19 DIAGNOSIS — T451X5S Adverse effect of antineoplastic and immunosuppressive drugs, sequela: Secondary | ICD-10-CM

## 2016-07-19 DIAGNOSIS — Z79899 Other long term (current) drug therapy: Secondary | ICD-10-CM

## 2016-07-19 DIAGNOSIS — D649 Anemia, unspecified: Secondary | ICD-10-CM | POA: Diagnosis not present

## 2016-07-19 DIAGNOSIS — K219 Gastro-esophageal reflux disease without esophagitis: Secondary | ICD-10-CM

## 2016-07-19 LAB — COMPREHENSIVE METABOLIC PANEL
ALT: 13 U/L — ABNORMAL LOW (ref 14–54)
ANION GAP: 6 (ref 5–15)
AST: 29 U/L (ref 15–41)
Albumin: 2.8 g/dL — ABNORMAL LOW (ref 3.5–5.0)
Alkaline Phosphatase: 141 U/L — ABNORMAL HIGH (ref 38–126)
BILIRUBIN TOTAL: 0.5 mg/dL (ref 0.3–1.2)
BUN: 11 mg/dL (ref 6–20)
CO2: 23 mmol/L (ref 22–32)
Calcium: 8.3 mg/dL — ABNORMAL LOW (ref 8.9–10.3)
Chloride: 106 mmol/L (ref 101–111)
Creatinine, Ser: 0.62 mg/dL (ref 0.44–1.00)
GFR calc Af Amer: >60 mL/min (ref >60)
Glucose, Bld: 107 mg/dL — ABNORMAL HIGH (ref 65–99)
POTASSIUM: 3.7 mmol/L (ref 3.5–5.1)
Sodium: 135 mmol/L (ref 135–145)
TOTAL PROTEIN: 6.3 g/dL — AB (ref 6.5–8.1)

## 2016-07-19 LAB — CBC WITH DIFFERENTIAL/PLATELET
Basophils Absolute: 0 10*3/uL (ref 0–0.1)
Basophils Relative: 0 %
EOS PCT: 0 %
Eosinophils Absolute: 0 10*3/uL (ref 0–0.7)
HEMATOCRIT: 31.1 % — AB (ref 35.0–47.0)
Hemoglobin: 10.5 g/dL — ABNORMAL LOW (ref 12.0–16.0)
LYMPHS PCT: 16 %
Lymphs Abs: 1 10*3/uL (ref 1.0–3.6)
MCH: 33.1 pg (ref 26.0–34.0)
MCHC: 33.8 g/dL (ref 32.0–36.0)
MCV: 98 fL (ref 80.0–100.0)
MONO ABS: 0.6 10*3/uL (ref 0.2–0.9)
MONOS PCT: 10 %
NEUTROS ABS: 5 10*3/uL (ref 1.4–6.5)
Neutrophils Relative %: 74 %
PLATELETS: 77 10*3/uL — AB (ref 150–440)
RBC: 3.17 MIL/uL — ABNORMAL LOW (ref 3.80–5.20)
RDW: 18.6 % — AB (ref 11.5–14.5)
WBC: 6.7 10*3/uL (ref 3.6–11.0)

## 2016-07-19 MED ORDER — OXALIPLATIN CHEMO INJECTION 100 MG/20ML
77.0000 mg/m2 | Freq: Once | INTRAVENOUS | Status: AC
  Administered 2016-07-19: 135 mg via INTRAVENOUS
  Filled 2016-07-19: qty 20

## 2016-07-19 MED ORDER — FLUOROURACIL CHEMO INJECTION 2.5 GM/50ML
400.0000 mg/m2 | Freq: Once | INTRAVENOUS | Status: AC
  Administered 2016-07-19: 700 mg via INTRAVENOUS
  Filled 2016-07-19: qty 14

## 2016-07-19 MED ORDER — LEUCOVORIN CALCIUM INJECTION 350 MG
400.0000 mg/m2 | Freq: Once | INTRAMUSCULAR | Status: AC
  Administered 2016-07-19: 700 mg via INTRAVENOUS
  Filled 2016-07-19: qty 35

## 2016-07-19 MED ORDER — PALONOSETRON HCL INJECTION 0.25 MG/5ML
0.2500 mg | Freq: Once | INTRAVENOUS | Status: AC
  Administered 2016-07-19: 0.25 mg via INTRAVENOUS
  Filled 2016-07-19: qty 5

## 2016-07-19 MED ORDER — DEXTROSE 5 % IV SOLN
Freq: Once | INTRAVENOUS | Status: AC
  Administered 2016-07-19: 11:00:00 via INTRAVENOUS
  Filled 2016-07-19: qty 1000

## 2016-07-19 MED ORDER — SODIUM CHLORIDE 0.9% FLUSH
10.0000 mL | INTRAVENOUS | Status: DC | PRN
  Administered 2016-07-19: 10 mL via INTRAVENOUS
  Filled 2016-07-19: qty 10

## 2016-07-19 MED ORDER — SODIUM CHLORIDE 0.9 % IV SOLN
2400.0000 mg/m2 | INTRAVENOUS | Status: DC
  Administered 2016-07-19: 4200 mg via INTRAVENOUS
  Filled 2016-07-19: qty 84

## 2016-07-19 MED ORDER — HEPARIN SOD (PORK) LOCK FLUSH 100 UNIT/ML IV SOLN: 500.0000 [IU] | Freq: Once | INTRAVENOUS | Status: AC

## 2016-07-19 MED ORDER — SODIUM CHLORIDE 0.9 % IV SOLN: 10.0000 mg | Freq: Once | INTRAVENOUS | Status: DC

## 2016-07-19 MED ORDER — DEXAMETHASONE SODIUM PHOSPHATE 10 MG/ML IJ SOLN
10.0000 mg | Freq: Once | INTRAMUSCULAR | Status: AC
  Administered 2016-07-19: 10 mg via INTRAVENOUS
  Filled 2016-07-19: qty 1

## 2016-07-19 NOTE — Progress Notes (Signed)
Pt has had fluid drawn off abdomen but has gained fluid back in a few days. Pt has had appx 9 lb weight gain. Pt is very concerned as to why the fluid accumulated so quickly. Starting to have mild back pain due to increased fluid in abd.

## 2016-07-19 NOTE — Progress Notes (Signed)
Spoke with MD regarding platelets of 77, continue with tx for today

## 2016-07-21 ENCOUNTER — Inpatient Hospital Stay: Payer: BLUE CROSS/BLUE SHIELD

## 2016-07-21 VITALS — BP 105/69 | HR 89 | Temp 97.5°F | Resp 18

## 2016-07-21 DIAGNOSIS — C786 Secondary malignant neoplasm of retroperitoneum and peritoneum: Secondary | ICD-10-CM | POA: Diagnosis not present

## 2016-07-21 MED ORDER — PEGFILGRASTIM INJECTION 6 MG/0.6ML ~~LOC~~
6.0000 mg | PREFILLED_SYRINGE | Freq: Once | SUBCUTANEOUS | Status: AC
  Administered 2016-07-21: 6 mg via SUBCUTANEOUS

## 2016-07-21 MED ORDER — SODIUM CHLORIDE 0.9% FLUSH
10.0000 mL | INTRAVENOUS | Status: DC | PRN
  Administered 2016-07-21: 10 mL
  Filled 2016-07-21: qty 10

## 2016-07-21 MED ORDER — HEPARIN SOD (PORK) LOCK FLUSH 100 UNIT/ML IV SOLN
500.0000 [IU] | Freq: Once | INTRAVENOUS | Status: AC | PRN
  Administered 2016-07-21: 500 [IU]

## 2016-07-22 ENCOUNTER — Ambulatory Visit: Payer: BLUE CROSS/BLUE SHIELD

## 2016-07-24 ENCOUNTER — Encounter: Payer: Self-pay | Admitting: Oncology

## 2016-07-25 ENCOUNTER — Other Ambulatory Visit: Payer: Self-pay | Admitting: Oncology

## 2016-07-25 ENCOUNTER — Other Ambulatory Visit: Payer: Self-pay | Admitting: *Deleted

## 2016-07-25 DIAGNOSIS — C801 Malignant (primary) neoplasm, unspecified: Principal | ICD-10-CM

## 2016-07-25 DIAGNOSIS — Z7189 Other specified counseling: Secondary | ICD-10-CM | POA: Insufficient documentation

## 2016-07-25 DIAGNOSIS — R188 Other ascites: Secondary | ICD-10-CM

## 2016-07-25 DIAGNOSIS — C786 Secondary malignant neoplasm of retroperitoneum and peritoneum: Secondary | ICD-10-CM

## 2016-07-25 NOTE — Progress Notes (Signed)
START ON PATHWAY REGIMEN - Colorectal  [id number]: FOLFIRI   A cycle is every 14 days:     Irinotecan (Camptosar(R)) 180 mg/m2 in 500 mL D5W IV over 90 minutes day 1, q14 days Dose Mod: None     Leucovorin 400 mg/m2 in 250 mL D5W IV over 2 hours day 1, q14 days, followed immediately by Dose Mod: None     5-Fluorouracil 400 mg/m2 IV bolus over 2-4 minutes day 1, q14 days Dose Mod: None     5-Fluorouracil 2,400 mg/m2 in ______mL NS CIV as a 46 hour infusion starting on day 1, q14 days Dose Mod: None Additional Orders: **Note: order sheet contains two q14 day cycles** Loperamide 4 mg PO first dose then 2 mg PO with every loose bowel movement up to a total of 16 mg/day (optional)  **Always confirm dose/schedule in your pharmacy ordering system**    Patient Characteristics: Metastatic Colorectal, Second Line, KRAS Mutation Positive/Unknown, BRAF Wild-Type/Unknown, Bevacizumab Ineligible Current evidence of distant metastases? Yes AJCC T Category: [place] AJCC N Category: NX AJCC M Category: M1c AJCC 8 Stage Grouping: IVC BRAF Mutation Status: Wild Type (no mutation) KRAS/NRAS Mutation Status: Mutation Positive Line of therapy: Second Line Would you be surprised if this patient died  in the next year? I would NOT be surprised if this patient died in the next year  Intent of Therapy: Non-Curative / Palliative Intent, Discussed with Patient

## 2016-07-27 ENCOUNTER — Ambulatory Visit
Admission: RE | Admit: 2016-07-27 | Discharge: 2016-07-27 | Disposition: A | Discharge status: Home / Self Care | Payer: BLUE CROSS/BLUE SHIELD | Source: Ambulatory Visit | Attending: Oncology | Admitting: Oncology

## 2016-07-27 ENCOUNTER — Other Ambulatory Visit: Payer: Self-pay | Admitting: *Deleted

## 2016-07-27 DIAGNOSIS — R188 Other ascites: Secondary | ICD-10-CM | POA: Diagnosis not present

## 2016-07-27 DIAGNOSIS — C786 Secondary malignant neoplasm of retroperitoneum and peritoneum: Secondary | ICD-10-CM | POA: Diagnosis present

## 2016-07-27 DIAGNOSIS — C801 Malignant (primary) neoplasm, unspecified: Secondary | ICD-10-CM

## 2016-07-27 MED ORDER — PANTOPRAZOLE SODIUM 40 MG PO TBEC: 40.0000 mg | DELAYED_RELEASE_TABLET | Freq: Every day | ORAL | 2 refills | Status: DC

## 2016-07-27 NOTE — Procedures (Signed)
US paracentesis without difficulty  Complications:  None  Blood Loss: none  See dictation in canopy pacs  

## 2016-07-29 ENCOUNTER — Ambulatory Visit: Payer: BLUE CROSS/BLUE SHIELD

## 2016-08-01 ENCOUNTER — Other Ambulatory Visit: Payer: Self-pay | Admitting: Oncology

## 2016-08-01 NOTE — Progress Notes (Signed)
[place]  Telephone:(336) [phone] Fax:(336) [phone]  ID: Valerie Horton OB: 1960-11-22  MR#: [id number]  [id number]  Patient Care Team: Valerie Roys, DO as PCP - General (Family Medicine) Clent Jacks, RN as Registered Nurse Garnetta Buddy, MD as Consulting Physician (Internal Medicine)  CHIEF COMPLAINT: Peritoneal carcinomatosis, metastatic adenocarcinoma of likely lower GI primary.  INTERVAL HISTORY: Patient returns to clinic today for further evaluation and consideration of cycle 1 of FOLFIRI. Her ascitic fluid has rapidly returned and patient once again feels bloated. She denies any abdominal pain. She has no neurological complaints. She denies any recent fevers or illnesses.  She has no chest pain or shortness of breath.  She denies and nausea, vomiting, constipation, or diarrhea. She has no melena or hematochezia. She has no urinary complains.  Patient offers no further specific complaints.  REVIEW OF SYSTEMS:   Review of Systems  Constitutional: Negative for fever, malaise/fatigue and weight loss.  Respiratory: Negative.  Negative for cough and shortness of breath.   Cardiovascular: Negative.  Negative for chest pain.  Gastrointestinal: Negative for abdominal pain, blood in stool, constipation, diarrhea, melena and vomiting.       Positive for abdominal bloating  Genitourinary: Negative.   Musculoskeletal: Negative.   Neurological: Positive for sensory change. Negative for weakness.  Psychiatric/Behavioral: The patient is not nervous/anxious.     As per HPI. Otherwise, a complete review of systems is negative.  PAST MEDICAL HISTORY: Past Medical History:  Diagnosis Date  . Acid reflux   . Allergic rhinitis   . Anxiety   . Arthritis    left knee  . Cancer ([place])   . Cervical spondylosis   . CTS (carpal tunnel syndrome)    Right  . Diverticulosis   . HSV-2 (herpes simplex virus 2) infection   . Hyperlipidemia   . Ovarian failure     . Rosacea   . Wears contact lenses     PAST SURGICAL HISTORY: Past Surgical History:  Procedure Laterality Date  . ANTERIOR CRUCIATE LIGAMENT REPAIR Left   . CESAREAN SECTION    . COLONOSCOPY WITH PROPOFOL N/A 01/29/2016   Procedure: COLONOSCOPY WITH PROPOFOL;  Surgeon: Lucilla Lame, MD;  Location: [facility];  Service: Endoscopy;  Laterality: N/A;  . ESOPHAGOGASTRODUODENOSCOPY (EGD) WITH PROPOFOL N/A 01/29/2016   Procedure: ESOPHAGOGASTRODUODENOSCOPY (EGD) WITH PROPOFOL;  Surgeon: Lucilla Lame, MD;  Location: [facility];  Service: Endoscopy;  Laterality: N/A;  . PERIPHERAL VASCULAR CATHETERIZATION N/A 02/10/2016   Procedure: Glori Luis Cath Insertion;  Surgeon: Algernon Huxley, MD;  Location: [facility];  Service: Cardiovascular;  Laterality: N/A;    FAMILY HISTORY: Unknown.  Patient is adopted.     ADVANCED DIRECTIVES:    HEALTH MAINTENANCE: Social History  Substance Use Topics  . Smoking status: Former Research scientist (life sciences)  . Smokeless tobacco: Never Used     Comment: Quit in her 89's  . Alcohol use 0.0 oz/week     Comment: 1 drink/mo     Colonoscopy:  PAP:  Bone density:  Lipid panel:  No Known Allergies  Current Outpatient Prescriptions  Medication Sig Dispense Refill  . acyclovir (ZOVIRAX) 800 MG tablet Take 1 tablet (800 mg total) by mouth daily. 90 tablet 1  . escitalopram (LEXAPRO) 10 MG tablet TAKE 1 TABLET (10 MG TOTAL) BY MOUTH DAILY. 90 tablet 1  . magic mouthwash SOLN Take 5 mLs by mouth 3 (three) times daily as needed for mouth pain. 240 mL 0  .  ondansetron (ZOFRAN) 8 MG tablet Take by mouth every 8 (eight) hours as needed for nausea or vomiting.    . pantoprazole (PROTONIX) 40 MG tablet Take 1 tablet (40 mg total) by mouth daily. 30 tablet 2  . prochlorperazine (COMPAZINE) 10 MG tablet Take 10 mg by mouth every 6 (six) hours as needed for nausea or vomiting.     No current facility-administered medications for this visit.     OBJECTIVE: Vitals:    08/02/16 0928  BP: 113/70  Pulse: 96  Resp: 18  Temp: 98.4 F (36.9 C)     Body mass index is 24.39 kg/m.    ECOG FS:0 - Asymptomatic  General: Well-developed, well-nourished, no acute distress. Eyes: Pink conjunctiva, anicteric sclera. Lungs: Clear to auscultation bilaterally. Heart: Regular rate and rhythm. No rubs, murmurs, or gallops. Abdomen: Distended, nontender.  Musculoskeletal: No edema, cyanosis, or clubbing. Neuro: Alert, answering all questions appropriately. Cranial nerves grossly intact. Skin: No rashes or petechiae noted. Psych: Normal affect.   LAB RESULTS:  Lab Results  Component Value Date   NA 137 08/02/2016   K 3.5 08/02/2016   CL 108 08/02/2016   CO2 22 08/02/2016   GLUCOSE 109 (H) 08/02/2016   BUN 11 08/02/2016   CREATININE 0.65 08/02/2016   CALCIUM 8.1 (L) 08/02/2016   PROT 6.1 (L) 08/02/2016   ALBUMIN 2.5 (L) 08/02/2016   AST 29 08/02/2016   ALT 13 (L) 08/02/2016   ALKPHOS 132 (H) 08/02/2016   BILITOT 0.4 08/02/2016   GFRNONAA >60 08/02/2016   GFRAA >60 08/02/2016    Lab Results  Component Value Date   WBC 9.7 08/02/2016   NEUTROABS 7.2 (H) 08/02/2016   HGB 10.6 (L) 08/02/2016   HCT 31.2 (L) 08/02/2016   MCV 98.2 08/02/2016   PLT 85 (L) 08/02/2016   Lab Results  Component Value Date   CA125 86.6 (H) 03/29/2016     STUDIES: Ct Abdomen Pelvis W Contrast  Result Date: 07/11/2016 CLINICAL DATA:  Restaging peritoneal carcinomatosis. EXAM: CT ABDOMEN AND PELVIS WITH CONTRAST TECHNIQUE: Multidetector CT imaging of the abdomen and pelvis was performed using the standard protocol following bolus administration of intravenous contrast. CONTRAST:  44mL ISOVUE-300 IOPAMIDOL (ISOVUE-300) INJECTION 61% COMPARISON:  None. FINDINGS: Lower chest: No acute abnormality. Hepatobiliary: 6 mm calcified implant along the dome of liver is identified and appears unchanged, image 62 of series 6. Contour the liver is slightly irregular. Hypertrophy of the  caudate lobe and lateral segment of left lobe of liver noted. The gallbladder appears normal. No biliary dilatation. Pancreas: Unremarkable. No pancreatic ductal dilatation or surrounding inflammatory changes. Spleen: Prominent spleen measures 11.5 cm in length. No focal splenic abnormality. Adrenals/Urinary Tract: Adrenal glands are unremarkable. Kidneys are normal, without renal calculi, focal lesion, or hydronephrosis. Bladder is unremarkable. Stomach/Bowel: Stomach is within normal limits. Appendix appears normal. No evidence of bowel wall thickening, distention, or inflammatory changes. Vascular/Lymphatic: Small esophageal varices identified, image 16 of series 2. Normal appearance of the abdominal aorta. The portal vein appears patent. No upper abdominal or pelvic adenopathy. No inguinal adenopathy. Reproductive: Uterus and bilateral adnexa are unremarkable. Other: There is a moderate to marked volume of ascites identified within the abdomen and pelvis. Fluid volume around the liver appears increased from previous exam. This currently measures 2.4 cm in thickness, image 38 of series 2. Previously 0.7 cm. Soft tissue infiltration within the omentum is again noted and appears similar to previous exam. Similar appearance of peritoneal thickening and fluid in the  cul-de-sac. Musculoskeletal: No acute or significant osseous findings. IMPRESSION: 1. Increase in volume of abdominal ascites compared with previous exam. 2. Similar appearance of soft tissue infiltration within the omental fat as well as peritoneal thickening/enhancement. 3. No evidence for bowel obstruction obstructive uropathy. 4. Morphologic features a liver are noted suggestive of early cirrhosis. Additionally, there are small esophageal varices identified. Electronically Signed   By: Kerby Moors M.D.   On: 07/11/2016 11:35   US Paracentesis  Result Date: 07/27/2016 INDICATION: Ascites, peritoneal carcinomatosis EXAM: ULTRASOUND GUIDED  PARACENTESIS MEDICATIONS: None. COMPLICATIONS: None immediate. PROCEDURE: Informed written consent was obtained from the patient after a discussion of the risks, benefits and alternatives to treatment. A timeout was performed prior to the initiation of the procedure. Initial ultrasound scanning demonstrates a large amount of ascites within the right abdomen. The right mid abdomen was prepped and draped in the usual sterile fashion. 1% lidocaine with epinephrine was used for local anesthesia. Following this, a 6 Fr Safe-T-Centesis catheter was introduced. An ultrasound image was saved for documentation purposes. The paracentesis was performed. The catheter was removed and a dressing was applied. The patient tolerated the procedure well without immediate post procedural complication. FINDINGS: A total of approximately 4.5 L of clear yellow fluid was removed. IMPRESSION: Successful ultrasound-guided paracentesis yielding 4.5 liters of peritoneal fluid. Electronically Signed   By: Inez Catalina M.D.   On: 07/27/2016 15:42   US Paracentesis  Result Date: 07/19/2016 CLINICAL DATA:  Peritoneal carcinomatosis, recurrent abdominal ascites EXAM: ULTRASOUND GUIDED PARACENTESIS TECHNIQUE: The procedure, risks (including but not limited to bleeding, infection, organ damage ), benefits, and alternatives were explained to the patient. Questions regarding the procedure were encouraged and answered. The patient understands and consents to the procedure. Survey ultrasound of the abdomen was performed and an appropriate skin entry site in the right lateral abdomen was selected. Skin site was marked, prepped with chlorhexidine, and draped in usual sterile fashion, and infiltrated locally with 1% lidocaine. A Safe-T-Centesis needle was advanced into the peritoneal space until fluid could be aspirated. The sheath was advanced and the needle removed. 2.8 L of clear yellowascites were aspirated. COMPLICATIONS: COMPLICATIONS none  IMPRESSION: Technically successful ultrasound guided paracentesis, removing 2.8 L of ascites. Electronically Signed   By: Lucrezia Europe M.D.   On: 07/19/2016 17:07   US Paracentesis  Result Date: 07/06/2016 INDICATION: Ascites and history of peritoneal carcinomatosis EXAM: ULTRASOUND GUIDED  PARACENTESIS MEDICATIONS: None. COMPLICATIONS: None immediate. PROCEDURE: Informed written consent was obtained from the patient after a discussion of the risks, benefits and alternatives to treatment. A timeout was performed prior to the initiation of the procedure. Initial ultrasound scanning demonstrates a moderate amount of ascites within the abdomen. The right abdomen was prepped and draped in the usual sterile fashion. 1% lidocaine with epinephrine was used for local anesthesia. Following this, a 6 Fr Safe-T-Centesis catheter was introduced. An ultrasound image was saved for documentation purposes. The paracentesis was performed. The catheter was removed and a dressing was applied. The patient tolerated the procedure well without immediate post procedural complication. FINDINGS: A total of approximately 3.1 L of clear yellow fluid was removed. Samples were sent to the laboratory as requested by the clinical team. IMPRESSION: Successful ultrasound-guided paracentesis yielding 3.1 liters of peritoneal fluid. Electronically Signed   By: Inez Catalina M.D.   On: 07/06/2016 14:20    ASSESSMENT: Peritoneal carcinomatosis, metastatic adenocarcinoma of likely lower GI primary.  PLAN:    1. Peritoneal carcinomatosis: Pathology results reviewed,  immunohistochemistry suggested lower GI primary. CT results from July 11, 2016 essentially revealed stable disease. No malignant cells were seen on the ascitic fluid.  Although, with reaccumulation of fluid rapidly there is concern of progression of disease.  Of note, patient had a normal colonoscopy on October 16, 2014 in [place], [place]. Because of suspected progression of  disease, patient's treatment was switched to FOLFIRI. She will also have consultation at [facility] with surgical oncology as well as medical oncology. Return to clinic in 2 days for pump removal and Neulasta and then in 2 weeks for consideration of cycle 2.  2: Genetic testing: Negative. 3. Anemia: Mild, monitor. 4. Thrombocytopenia: Proceed with treatment as above. 5. Neutropenia: Neulasta with the remainder treatments as above. 6. Peripheral neuropathy: Resolved. Monitor while receiving oxaliplatin. 7. Ascites: Repeat ultrasound-guided paracentesis. Patient appears to require paracentesis about once per week.  Patient expressed understanding and was in agreement with this plan. She also understands that She can call clinic at any time with any questions, concerns, or complaints.   Cancer Staging No matching staging information was found for the patient.   Lloyd Huger, MD 08/05/16 8:54 AM

## 2016-08-02 ENCOUNTER — Inpatient Hospital Stay (HOSPITAL_BASED_OUTPATIENT_CLINIC_OR_DEPARTMENT_OTHER): Payer: BLUE CROSS/BLUE SHIELD | Admitting: Oncology

## 2016-08-02 ENCOUNTER — Inpatient Hospital Stay: Payer: BLUE CROSS/BLUE SHIELD

## 2016-08-02 ENCOUNTER — Other Ambulatory Visit: Payer: Self-pay | Admitting: *Deleted

## 2016-08-02 VITALS — BP 113/70 | HR 96 | Temp 98.4°F | Resp 18 | Wt 139.4 lb

## 2016-08-02 DIAGNOSIS — Z79899 Other long term (current) drug therapy: Secondary | ICD-10-CM

## 2016-08-02 DIAGNOSIS — D649 Anemia, unspecified: Secondary | ICD-10-CM

## 2016-08-02 DIAGNOSIS — Z7189 Other specified counseling: Secondary | ICD-10-CM

## 2016-08-02 DIAGNOSIS — C801 Malignant (primary) neoplasm, unspecified: Principal | ICD-10-CM

## 2016-08-02 DIAGNOSIS — D701 Agranulocytosis secondary to cancer chemotherapy: Secondary | ICD-10-CM | POA: Diagnosis not present

## 2016-08-02 DIAGNOSIS — K219 Gastro-esophageal reflux disease without esophagitis: Secondary | ICD-10-CM

## 2016-08-02 DIAGNOSIS — D6959 Other secondary thrombocytopenia: Secondary | ICD-10-CM

## 2016-08-02 DIAGNOSIS — C786 Secondary malignant neoplasm of retroperitoneum and peritoneum: Secondary | ICD-10-CM

## 2016-08-02 DIAGNOSIS — I8500 Esophageal varices without bleeding: Secondary | ICD-10-CM

## 2016-08-02 DIAGNOSIS — Z87891 Personal history of nicotine dependence: Secondary | ICD-10-CM

## 2016-08-02 DIAGNOSIS — E785 Hyperlipidemia, unspecified: Secondary | ICD-10-CM

## 2016-08-02 DIAGNOSIS — R188 Other ascites: Secondary | ICD-10-CM

## 2016-08-02 DIAGNOSIS — T451X5S Adverse effect of antineoplastic and immunosuppressive drugs, sequela: Secondary | ICD-10-CM

## 2016-08-02 DIAGNOSIS — M1990 Unspecified osteoarthritis, unspecified site: Secondary | ICD-10-CM

## 2016-08-02 LAB — COMPREHENSIVE METABOLIC PANEL WITH GFR
ALT: 13 U/L — ABNORMAL LOW (ref 14–54)
AST: 29 U/L (ref 15–41)
Albumin: 2.5 g/dL — ABNORMAL LOW (ref 3.5–5.0)
Alkaline Phosphatase: 132 U/L — ABNORMAL HIGH (ref 38–126)
Anion gap: 7 (ref 5–15)
BUN: 11 mg/dL (ref 6–20)
CO2: 22 mmol/L (ref 22–32)
Calcium: 8.1 mg/dL — ABNORMAL LOW (ref 8.9–10.3)
Chloride: 108 mmol/L (ref 101–111)
Creatinine, Ser: 0.65 mg/dL (ref 0.44–1.00)
GFR calc Af Amer: >60 mL/min
GFR calc non Af Amer: >60 mL/min
Glucose, Bld: 109 mg/dL — ABNORMAL HIGH (ref 65–99)
Potassium: 3.5 mmol/L (ref 3.5–5.1)
Sodium: 137 mmol/L (ref 135–145)
Total Bilirubin: 0.4 mg/dL (ref 0.3–1.2)
Total Protein: 6.1 g/dL — ABNORMAL LOW (ref 6.5–8.1)

## 2016-08-02 LAB — CBC WITH DIFFERENTIAL/PLATELET
BASOS ABS: 0 10*3/uL (ref 0–0.1)
Basophils Relative: 0 %
Eosinophils Absolute: 0 10*3/uL (ref 0–0.7)
Eosinophils Relative: 0 %
HEMATOCRIT: 31.2 % — AB (ref 35.0–47.0)
Hemoglobin: 10.6 g/dL — ABNORMAL LOW (ref 12.0–16.0)
LYMPHS PCT: 16 %
Lymphs Abs: 1.5 10*3/uL (ref 1.0–3.6)
MCH: 33.5 pg (ref 26.0–34.0)
MCHC: 34.1 g/dL (ref 32.0–36.0)
MCV: 98.2 fL (ref 80.0–100.0)
MONO ABS: 0.9 10*3/uL (ref 0.2–0.9)
MONOS PCT: 9 %
NEUTROS ABS: 7.2 10*3/uL — AB (ref 1.4–6.5)
Neutrophils Relative %: 75 %
Platelets: 85 10*3/uL — ABNORMAL LOW (ref 150–440)
RBC: 3.18 MIL/uL — ABNORMAL LOW (ref 3.80–5.20)
RDW: 18.6 % — AB (ref 11.5–14.5)
WBC: 9.7 10*3/uL (ref 3.6–11.0)

## 2016-08-02 MED ORDER — SODIUM CHLORIDE 0.9 % IV SOLN
Freq: Once | INTRAVENOUS | Status: AC
  Administered 2016-08-02: 11:00:00 via INTRAVENOUS
  Filled 2016-08-02: qty 1000

## 2016-08-02 MED ORDER — DEXAMETHASONE SODIUM PHOSPHATE 10 MG/ML IJ SOLN
10.0000 mg | Freq: Once | INTRAMUSCULAR | Status: AC
  Administered 2016-08-02: 10 mg via INTRAVENOUS
  Filled 2016-08-02: qty 1

## 2016-08-02 MED ORDER — IRINOTECAN HCL CHEMO INJECTION 100 MG/5ML
180.0000 mg/m2 | Freq: Once | INTRAVENOUS | Status: AC
  Administered 2016-08-02: 300 mg via INTRAVENOUS
  Filled 2016-08-02: qty 15

## 2016-08-02 MED ORDER — SODIUM CHLORIDE 0.9% FLUSH
10.0000 mL | INTRAVENOUS | Status: DC | PRN
  Administered 2016-08-02: 10 mL
  Filled 2016-08-02: qty 10

## 2016-08-02 MED ORDER — DEXAMETHASONE SODIUM PHOSPHATE 100 MG/10ML IJ SOLN: 10.0000 mg | Freq: Once | INTRAMUSCULAR | Status: DC

## 2016-08-02 MED ORDER — SODIUM CHLORIDE 0.9 % IV SOLN
2400.0000 mg/m2 | INTRAVENOUS | Status: DC
  Administered 2016-08-02: 4100 mg via INTRAVENOUS
  Filled 2016-08-02: qty 82

## 2016-08-02 MED ORDER — FLUOROURACIL CHEMO INJECTION 2.5 GM/50ML
400.0000 mg/m2 | Freq: Once | INTRAVENOUS | Status: AC
  Administered 2016-08-02: 700 mg via INTRAVENOUS
  Filled 2016-08-02: qty 14

## 2016-08-02 MED ORDER — PALONOSETRON HCL INJECTION 0.25 MG/5ML
0.2500 mg | Freq: Once | INTRAVENOUS | Status: AC
  Administered 2016-08-02: 0.25 mg via INTRAVENOUS
  Filled 2016-08-02: qty 5

## 2016-08-02 MED ORDER — PANTOPRAZOLE SODIUM 40 MG PO TBEC: 40.0000 mg | DELAYED_RELEASE_TABLET | Freq: Every day | ORAL | 2 refills | Status: DC

## 2016-08-02 MED ORDER — ATROPINE SULFATE 1 MG/ML IJ SOLN
0.5000 mg | Freq: Once | INTRAMUSCULAR | Status: AC | PRN
  Administered 2016-08-02: 0.5 mg via INTRAVENOUS
  Filled 2016-08-02: qty 1

## 2016-08-02 MED ORDER — HEPARIN SOD (PORK) LOCK FLUSH 100 UNIT/ML IV SOLN: 500.0000 [IU] | Freq: Once | INTRAVENOUS | Status: DC | PRN

## 2016-08-02 MED ORDER — LEUCOVORIN CALCIUM INJECTION 350 MG
700.0000 mg | Freq: Once | INTRAMUSCULAR | Status: AC
  Administered 2016-08-02: 700 mg via INTRAVENOUS
  Filled 2016-08-02: qty 35

## 2016-08-02 NOTE — Progress Notes (Signed)
Plt =85.  Called MD to verify. Wants to proceed with treatment today.

## 2016-08-02 NOTE — Progress Notes (Signed)
Patient is here for follow up, [facility] has not contacted patient. She needs a refill on her Protonix.

## 2016-08-03 ENCOUNTER — Encounter: Payer: Self-pay | Admitting: Oncology

## 2016-08-04 ENCOUNTER — Inpatient Hospital Stay: Payer: BLUE CROSS/BLUE SHIELD | Attending: Oncology

## 2016-08-04 VITALS — BP 102/68 | HR 79 | Temp 96.9°F | Resp 20

## 2016-08-04 DIAGNOSIS — D696 Thrombocytopenia, unspecified: Secondary | ICD-10-CM | POA: Insufficient documentation

## 2016-08-04 DIAGNOSIS — C801 Malignant (primary) neoplasm, unspecified: Secondary | ICD-10-CM | POA: Insufficient documentation

## 2016-08-04 DIAGNOSIS — F419 Anxiety disorder, unspecified: Secondary | ICD-10-CM | POA: Diagnosis not present

## 2016-08-04 DIAGNOSIS — M1990 Unspecified osteoarthritis, unspecified site: Secondary | ICD-10-CM | POA: Insufficient documentation

## 2016-08-04 DIAGNOSIS — C786 Secondary malignant neoplasm of retroperitoneum and peritoneum: Secondary | ICD-10-CM | POA: Diagnosis present

## 2016-08-04 DIAGNOSIS — Z87891 Personal history of nicotine dependence: Secondary | ICD-10-CM | POA: Insufficient documentation

## 2016-08-04 DIAGNOSIS — D701 Agranulocytosis secondary to cancer chemotherapy: Secondary | ICD-10-CM | POA: Insufficient documentation

## 2016-08-04 DIAGNOSIS — Z79899 Other long term (current) drug therapy: Secondary | ICD-10-CM | POA: Insufficient documentation

## 2016-08-04 DIAGNOSIS — T451X5S Adverse effect of antineoplastic and immunosuppressive drugs, sequela: Secondary | ICD-10-CM | POA: Insufficient documentation

## 2016-08-04 DIAGNOSIS — K219 Gastro-esophageal reflux disease without esophagitis: Secondary | ICD-10-CM | POA: Diagnosis not present

## 2016-08-04 DIAGNOSIS — Z5111 Encounter for antineoplastic chemotherapy: Secondary | ICD-10-CM | POA: Diagnosis not present

## 2016-08-04 DIAGNOSIS — R188 Other ascites: Secondary | ICD-10-CM | POA: Diagnosis not present

## 2016-08-04 DIAGNOSIS — D649 Anemia, unspecified: Secondary | ICD-10-CM | POA: Diagnosis not present

## 2016-08-04 DIAGNOSIS — E785 Hyperlipidemia, unspecified: Secondary | ICD-10-CM | POA: Diagnosis not present

## 2016-08-04 MED ORDER — HEPARIN SOD (PORK) LOCK FLUSH 100 UNIT/ML IV SOLN
500.0000 [IU] | Freq: Once | INTRAVENOUS | Status: AC | PRN
  Administered 2016-08-04: 500 [IU]
  Filled 2016-08-04 (×2): qty 5

## 2016-08-04 MED ORDER — SODIUM CHLORIDE 0.9% FLUSH
10.0000 mL | INTRAVENOUS | Status: DC | PRN
  Administered 2016-08-04: 10 mL
  Filled 2016-08-04: qty 10

## 2016-08-04 MED ORDER — PEGFILGRASTIM INJECTION 6 MG/0.6ML ~~LOC~~
6.0000 mg | PREFILLED_SYRINGE | Freq: Once | SUBCUTANEOUS | Status: AC
  Administered 2016-08-04: 6 mg via SUBCUTANEOUS
  Filled 2016-08-04: qty 0.6

## 2016-08-08 ENCOUNTER — Ambulatory Visit
Admission: RE | Admit: 2016-08-08 | Discharge: 2016-08-08 | Disposition: A | Discharge status: Home / Self Care | Payer: BLUE CROSS/BLUE SHIELD | Source: Ambulatory Visit | Attending: Family Medicine | Admitting: Family Medicine

## 2016-08-08 DIAGNOSIS — C786 Secondary malignant neoplasm of retroperitoneum and peritoneum: Secondary | ICD-10-CM

## 2016-08-08 DIAGNOSIS — C801 Malignant (primary) neoplasm, unspecified: Secondary | ICD-10-CM | POA: Insufficient documentation

## 2016-08-08 DIAGNOSIS — Z7189 Other specified counseling: Secondary | ICD-10-CM

## 2016-08-08 DIAGNOSIS — R188 Other ascites: Secondary | ICD-10-CM | POA: Insufficient documentation

## 2016-08-09 ENCOUNTER — Encounter: Payer: Self-pay | Admitting: *Deleted

## 2016-08-09 DIAGNOSIS — C786 Secondary malignant neoplasm of retroperitoneum and peritoneum: Secondary | ICD-10-CM

## 2016-08-09 DIAGNOSIS — C801 Malignant (primary) neoplasm, unspecified: Principal | ICD-10-CM

## 2016-08-09 DIAGNOSIS — R188 Other ascites: Secondary | ICD-10-CM

## 2016-08-15 NOTE — Progress Notes (Signed)
[place]  Telephone:(336) [phone] Fax:(336) [phone]  ID: TAHJANAE KINZER OB: 1960/11/15  MR#: [id number]  [id number]  Patient Care Team: Valerie Roys, DO as PCP - General (Family Medicine) Clent Jacks, RN as Registered Nurse Garnetta Buddy, MD as Consulting Physician (Internal Medicine)  CHIEF COMPLAINT: Peritoneal carcinomatosis, metastatic adenocarcinoma of likely lower GI primary.  INTERVAL HISTORY: Patient returns to clinic today for further evaluation and consideration of cycle 2 of FOLFIRI. Her ascitic fluid has returned, but not as quickly as prior. She currently feels bloated. She denies any abdominal pain. She has no neurological complaints. She denies any recent fevers or illnesses.  She has no chest pain or shortness of breath.  She denies and nausea, vomiting, constipation, or diarrhea. She has no melena or hematochezia. She has no urinary complains.  Patient offers no further specific complaints.  REVIEW OF SYSTEMS:   Review of Systems  Constitutional: Negative for fever, malaise/fatigue and weight loss.  Respiratory: Negative.  Negative for cough and shortness of breath.   Cardiovascular: Negative.  Negative for chest pain.  Gastrointestinal: Negative for abdominal pain, blood in stool, constipation, diarrhea, melena and vomiting.       Positive for abdominal bloating  Genitourinary: Negative.   Musculoskeletal: Negative.   Neurological: Positive for sensory change. Negative for weakness.  Psychiatric/Behavioral: The patient is not nervous/anxious.     As per HPI. Otherwise, a complete review of systems is negative.  PAST MEDICAL HISTORY: Past Medical History:  Diagnosis Date  . Acid reflux   . Allergic rhinitis   . Anxiety   . Arthritis    left knee  . Cancer ([place])   . Cervical spondylosis   . CTS (carpal tunnel syndrome)    Right  . Diverticulosis   . HSV-2 (herpes simplex virus 2) infection   . Hyperlipidemia   .  Ovarian failure   . Rosacea   . Wears contact lenses     PAST SURGICAL HISTORY: Past Surgical History:  Procedure Laterality Date  . ANTERIOR CRUCIATE LIGAMENT REPAIR Left   . CESAREAN SECTION    . COLONOSCOPY WITH PROPOFOL N/A 01/29/2016   Procedure: COLONOSCOPY WITH PROPOFOL;  Surgeon: Lucilla Lame, MD;  Location: [facility];  Service: Endoscopy;  Laterality: N/A;  . ESOPHAGOGASTRODUODENOSCOPY (EGD) WITH PROPOFOL N/A 01/29/2016   Procedure: ESOPHAGOGASTRODUODENOSCOPY (EGD) WITH PROPOFOL;  Surgeon: Lucilla Lame, MD;  Location: [facility];  Service: Endoscopy;  Laterality: N/A;  . PERIPHERAL VASCULAR CATHETERIZATION N/A 02/10/2016   Procedure: Glori Luis Cath Insertion;  Surgeon: Algernon Huxley, MD;  Location: [facility];  Service: Cardiovascular;  Laterality: N/A;    FAMILY HISTORY: Unknown.  Patient is adopted.     ADVANCED DIRECTIVES:    HEALTH MAINTENANCE: Social History  Substance Use Topics  . Smoking status: Former Research scientist (life sciences)  . Smokeless tobacco: Never Used     Comment: Quit in her 20's  . Alcohol use 0.0 oz/week     Comment: 1 drink/mo     Colonoscopy:  PAP:  Bone density:  Lipid panel:  No Known Allergies  Current Outpatient Prescriptions  Medication Sig Dispense Refill  . acyclovir (ZOVIRAX) 800 MG tablet Take 1 tablet (800 mg total) by mouth daily. 90 tablet 1  . escitalopram (LEXAPRO) 10 MG tablet TAKE 1 TABLET (10 MG TOTAL) BY MOUTH DAILY. 90 tablet 1  . magic mouthwash SOLN Take 5 mLs by mouth 3 (three) times daily as needed for mouth pain. 240 mL  0  . ondansetron (ZOFRAN) 8 MG tablet Take by mouth every 8 (eight) hours as needed for nausea or vomiting.    . pantoprazole (PROTONIX) 40 MG tablet Take 1 tablet (40 mg total) by mouth daily. 30 tablet 2  . prochlorperazine (COMPAZINE) 10 MG tablet Take 10 mg by mouth every 6 (six) hours as needed for nausea or vomiting.     Current Facility-Administered Medications  Medication Dose Route  Frequency Provider Last Rate Last Dose  . fluorouracil (ADRUCIL) 4,100 mg in sodium chloride 0.9 % 68 mL chemo infusion  2,400 mg/m2 (Treatment Plan Recorded) Intravenous 1 day or 1 dose Lloyd Huger, MD   4,100 mg at 08/16/16 1302   Facility-Administered Medications Ordered in Other Visits  Medication Dose Route Frequency Provider Last Rate Last Dose  . heparin lock flush 100 unit/mL  500 Units Intravenous Once Lloyd Huger, MD      . sodium chloride flush (NS) 0.9 % injection 10 mL  10 mL Intravenous PRN Lloyd Huger, MD   10 mL at 08/16/16 0925    OBJECTIVE: Vitals:   08/16/16 0937  BP: 101/67  Pulse: 91  Resp: 18  Temp: 97.7 F (36.5 C)     Body mass index is 25.03 kg/m.    ECOG FS:0 - Asymptomatic  General: Well-developed, well-nourished, no acute distress. Eyes: Pink conjunctiva, anicteric sclera. Lungs: Clear to auscultation bilaterally. Heart: Regular rate and rhythm. No rubs, murmurs, or gallops. Abdomen: Distended, nontender.  Musculoskeletal: No edema, cyanosis, or clubbing. Neuro: Alert, answering all questions appropriately. Cranial nerves grossly intact. Skin: No rashes or petechiae noted. Psych: Normal affect.   LAB RESULTS:  Lab Results  Component Value Date   NA 135 08/16/2016   K 3.8 08/16/2016   CL 106 08/16/2016   CO2 23 08/16/2016   GLUCOSE 110 (H) 08/16/2016   BUN 9 08/16/2016   CREATININE 0.67 08/16/2016   CALCIUM 8.3 (L) 08/16/2016   PROT 5.9 (L) 08/16/2016   ALBUMIN 2.5 (L) 08/16/2016   AST 31 08/16/2016   ALT 14 08/16/2016   ALKPHOS 119 08/16/2016   BILITOT 0.6 08/16/2016   GFRNONAA >60 08/16/2016   GFRAA >60 08/16/2016    Lab Results  Component Value Date   WBC 8.1 08/16/2016   NEUTROABS 6.2 08/16/2016   HGB 10.7 (L) 08/16/2016   HCT 30.7 (L) 08/16/2016   MCV 99.0 08/16/2016   PLT 108 (L) 08/16/2016   Lab Results  Component Value Date   CA125 86.6 (H) 03/29/2016     STUDIES: US Paracentesis  Result  Date: 08/08/2016 CLINICAL DATA:  Recurrent ascites, peritoneal carcinomatosis EXAM: ULTRASOUND GUIDED PARACENTESIS TECHNIQUE: The procedure, risks (including but not limited to bleeding, infection, organ damage ), benefits, and alternatives were explained to the patient. Questions regarding the procedure were encouraged and answered. The patient understands and consents to the procedure. Survey ultrasound of the abdomen was performed and an appropriate skin entry site in the right lateral abdomen was selected. Skin site was marked, prepped with chlorhexidine, and draped in usual sterile fashion, and infiltrated locally with 1% lidocaine. A Safe-T-Centesis sheath needle was advanced into the peritoneal space until fluid could be aspirated. The sheath was advanced and the needle removed. 2.75 L of clear yellowascites were aspirated. COMPLICATIONS: COMPLICATIONS none IMPRESSION: Technically successful ultrasound guided paracentesis, removing 2.75 L of ascites. Electronically Signed   By: Lucrezia Europe M.D.   On: 08/08/2016 15:29   US Paracentesis  Result Date: 07/27/2016 INDICATION:  Ascites, peritoneal carcinomatosis EXAM: ULTRASOUND GUIDED PARACENTESIS MEDICATIONS: None. COMPLICATIONS: None immediate. PROCEDURE: Informed written consent was obtained from the patient after a discussion of the risks, benefits and alternatives to treatment. A timeout was performed prior to the initiation of the procedure. Initial ultrasound scanning demonstrates a large amount of ascites within the right abdomen. The right mid abdomen was prepped and draped in the usual sterile fashion. 1% lidocaine with epinephrine was used for local anesthesia. Following this, a 6 Fr Safe-T-Centesis catheter was introduced. An ultrasound image was saved for documentation purposes. The paracentesis was performed. The catheter was removed and a dressing was applied. The patient tolerated the procedure well without immediate post procedural complication.  FINDINGS: A total of approximately 4.5 L of clear yellow fluid was removed. IMPRESSION: Successful ultrasound-guided paracentesis yielding 4.5 liters of peritoneal fluid. Electronically Signed   By: Inez Catalina M.D.   On: 07/27/2016 15:42   US Paracentesis  Result Date: 07/19/2016 CLINICAL DATA:  Peritoneal carcinomatosis, recurrent abdominal ascites EXAM: ULTRASOUND GUIDED PARACENTESIS TECHNIQUE: The procedure, risks (including but not limited to bleeding, infection, organ damage ), benefits, and alternatives were explained to the patient. Questions regarding the procedure were encouraged and answered. The patient understands and consents to the procedure. Survey ultrasound of the abdomen was performed and an appropriate skin entry site in the right lateral abdomen was selected. Skin site was marked, prepped with chlorhexidine, and draped in usual sterile fashion, and infiltrated locally with 1% lidocaine. A Safe-T-Centesis needle was advanced into the peritoneal space until fluid could be aspirated. The sheath was advanced and the needle removed. 2.8 L of clear yellowascites were aspirated. COMPLICATIONS: COMPLICATIONS none IMPRESSION: Technically successful ultrasound guided paracentesis, removing 2.8 L of ascites. Electronically Signed   By: Lucrezia Europe M.D.   On: 07/19/2016 17:07    ASSESSMENT: Peritoneal carcinomatosis, metastatic adenocarcinoma of likely lower GI primary.  PLAN:    1. Peritoneal carcinomatosis: Pathology results reviewed, immunohistochemistry suggested lower GI primary. CT results from July 11, 2016 essentially revealed stable disease. No malignant cells were seen on the ascitic fluid. Of note, patient had a normal colonoscopy on October 16, 2014 in [place], [place]. Because of suspected progression of disease, patient's treatment was switched to FOLFIRI. Patient had consultation at [facility] recently who determined that no surgical or intraperitoneal chemotherapy  would be beneficial. Proceed with cycle 2 of FOLFIRI today. Return to clinic in 2 days for pump removal and then in 2 weeks for consideration of cycle 3.  2: Genetic testing: Negative. 3. Anemia: Mild, monitor. 4. Thrombocytopenia: Proceed with treatment as above. 5. Neutropenia: Neulasta with the remainder treatments as above. 6. Peripheral neuropathy: Resolved. Monitor while receiving oxaliplatin. 7. Ascites: Repeat ultrasound-guided paracentesis. Patient appears to require paracentesis every 1-2 weeks.   Patient expressed understanding and was in agreement with this plan. She also understands that She can call clinic at any time with any questions, concerns, or complaints.   Cancer Staging No matching staging information was found for the patient.   Lloyd Huger, MD 08/17/16 3:49 PM

## 2016-08-16 ENCOUNTER — Inpatient Hospital Stay: Payer: BLUE CROSS/BLUE SHIELD

## 2016-08-16 ENCOUNTER — Inpatient Hospital Stay (HOSPITAL_BASED_OUTPATIENT_CLINIC_OR_DEPARTMENT_OTHER): Payer: BLUE CROSS/BLUE SHIELD | Admitting: Oncology

## 2016-08-16 VITALS — BP 101/67 | HR 91 | Temp 97.7°F | Resp 18 | Wt 143.1 lb

## 2016-08-16 DIAGNOSIS — C786 Secondary malignant neoplasm of retroperitoneum and peritoneum: Secondary | ICD-10-CM | POA: Diagnosis not present

## 2016-08-16 DIAGNOSIS — D696 Thrombocytopenia, unspecified: Secondary | ICD-10-CM

## 2016-08-16 DIAGNOSIS — Z79899 Other long term (current) drug therapy: Secondary | ICD-10-CM

## 2016-08-16 DIAGNOSIS — C801 Malignant (primary) neoplasm, unspecified: Secondary | ICD-10-CM | POA: Diagnosis not present

## 2016-08-16 DIAGNOSIS — M1990 Unspecified osteoarthritis, unspecified site: Secondary | ICD-10-CM

## 2016-08-16 DIAGNOSIS — D649 Anemia, unspecified: Secondary | ICD-10-CM

## 2016-08-16 DIAGNOSIS — D701 Agranulocytosis secondary to cancer chemotherapy: Secondary | ICD-10-CM

## 2016-08-16 DIAGNOSIS — T451X5S Adverse effect of antineoplastic and immunosuppressive drugs, sequela: Secondary | ICD-10-CM

## 2016-08-16 DIAGNOSIS — Z87891 Personal history of nicotine dependence: Secondary | ICD-10-CM

## 2016-08-16 DIAGNOSIS — K219 Gastro-esophageal reflux disease without esophagitis: Secondary | ICD-10-CM

## 2016-08-16 DIAGNOSIS — R188 Other ascites: Secondary | ICD-10-CM | POA: Diagnosis not present

## 2016-08-16 DIAGNOSIS — F419 Anxiety disorder, unspecified: Secondary | ICD-10-CM

## 2016-08-16 DIAGNOSIS — E785 Hyperlipidemia, unspecified: Secondary | ICD-10-CM

## 2016-08-16 LAB — CBC WITH DIFFERENTIAL/PLATELET
BASOS PCT: 1 %
Basophils Absolute: 0 10*3/uL (ref 0–0.1)
EOS ABS: 0 10*3/uL (ref 0–0.7)
EOS PCT: 0 %
HCT: 30.7 % — ABNORMAL LOW (ref 35.0–47.0)
Hemoglobin: 10.7 g/dL — ABNORMAL LOW (ref 12.0–16.0)
Lymphocytes Relative: 15 %
Lymphs Abs: 1.2 10*3/uL (ref 1.0–3.6)
MCH: 34.3 pg — ABNORMAL HIGH (ref 26.0–34.0)
MCHC: 34.7 g/dL (ref 32.0–36.0)
MCV: 99 fL (ref 80.0–100.0)
MONO ABS: 0.7 10*3/uL (ref 0.2–0.9)
MONOS PCT: 9 %
Neutro Abs: 6.2 10*3/uL (ref 1.4–6.5)
Neutrophils Relative %: 75 %
PLATELETS: 108 10*3/uL — AB (ref 150–440)
RBC: 3.1 MIL/uL — ABNORMAL LOW (ref 3.80–5.20)
RDW: 18.4 % — AB (ref 11.5–14.5)
WBC: 8.1 10*3/uL (ref 3.6–11.0)

## 2016-08-16 LAB — COMPREHENSIVE METABOLIC PANEL
ALBUMIN: 2.5 g/dL — AB (ref 3.5–5.0)
ALT: 14 U/L (ref 14–54)
ANION GAP: 6 (ref 5–15)
AST: 31 U/L (ref 15–41)
Alkaline Phosphatase: 119 U/L (ref 38–126)
BILIRUBIN TOTAL: 0.6 mg/dL (ref 0.3–1.2)
BUN: 9 mg/dL (ref 6–20)
CO2: 23 mmol/L (ref 22–32)
Calcium: 8.3 mg/dL — ABNORMAL LOW (ref 8.9–10.3)
Chloride: 106 mmol/L (ref 101–111)
Creatinine, Ser: 0.67 mg/dL (ref 0.44–1.00)
GFR calc Af Amer: >60 mL/min (ref >60)
GFR calc non Af Amer: >60 mL/min (ref >60)
GLUCOSE: 110 mg/dL — AB (ref 65–99)
POTASSIUM: 3.8 mmol/L (ref 3.5–5.1)
SODIUM: 135 mmol/L (ref 135–145)
TOTAL PROTEIN: 5.9 g/dL — AB (ref 6.5–8.1)

## 2016-08-16 MED ORDER — SODIUM CHLORIDE 0.9 % IV SOLN
2400.0000 mg/m2 | INTRAVENOUS | Status: DC
  Administered 2016-08-16: 4100 mg via INTRAVENOUS
  Filled 2016-08-16: qty 82

## 2016-08-16 MED ORDER — DEXAMETHASONE SODIUM PHOSPHATE 10 MG/ML IJ SOLN
10.0000 mg | Freq: Once | INTRAMUSCULAR | Status: AC
Start: 2016-08-16 — End: 2016-08-16
  Administered 2016-08-16: 10 mg via INTRAVENOUS
  Filled 2016-08-16: qty 1

## 2016-08-16 MED ORDER — FLUOROURACIL CHEMO INJECTION 2.5 GM/50ML
400.0000 mg/m2 | Freq: Once | INTRAVENOUS | Status: AC
  Administered 2016-08-16: 700 mg via INTRAVENOUS
  Filled 2016-08-16: qty 14

## 2016-08-16 MED ORDER — LEUCOVORIN CALCIUM INJECTION 350 MG
700.0000 mg | Freq: Once | INTRAVENOUS | Status: AC
  Administered 2016-08-16: 700 mg via INTRAVENOUS
  Filled 2016-08-16: qty 35

## 2016-08-16 MED ORDER — SODIUM CHLORIDE 0.9% FLUSH
10.0000 mL | INTRAVENOUS | Status: AC | PRN
  Administered 2016-08-16: 10 mL via INTRAVENOUS
  Filled 2016-08-16: qty 10

## 2016-08-16 MED ORDER — HEPARIN SOD (PORK) LOCK FLUSH 100 UNIT/ML IV SOLN: 500.0000 [IU] | Freq: Once | INTRAVENOUS | Status: AC

## 2016-08-16 MED ORDER — ATROPINE SULFATE 1 MG/ML IJ SOLN
0.5000 mg | Freq: Once | INTRAMUSCULAR | Status: AC | PRN
  Administered 2016-08-16: 0.5 mg via INTRAVENOUS
  Filled 2016-08-16: qty 1

## 2016-08-16 MED ORDER — SODIUM CHLORIDE 0.9 % IV SOLN
Freq: Once | INTRAVENOUS | Status: AC
  Administered 2016-08-16: 11:00:00 via INTRAVENOUS
  Filled 2016-08-16: qty 1000

## 2016-08-16 MED ORDER — PALONOSETRON HCL INJECTION 0.25 MG/5ML
0.2500 mg | Freq: Once | INTRAVENOUS | Status: AC
  Administered 2016-08-16: 0.25 mg via INTRAVENOUS
  Filled 2016-08-16: qty 5

## 2016-08-16 MED ORDER — IRINOTECAN HCL CHEMO INJECTION 100 MG/5ML
180.0000 mg/m2 | Freq: Once | INTRAVENOUS | Status: AC
  Administered 2016-08-16: 300 mg via INTRAVENOUS
  Filled 2016-08-16: qty 15

## 2016-08-16 NOTE — Progress Notes (Signed)
Patient is here for follow up, she is doing well she does have some fluid retention in her abdomen. She eats but feels full fast

## 2016-08-18 ENCOUNTER — Inpatient Hospital Stay: Payer: BLUE CROSS/BLUE SHIELD

## 2016-08-18 VITALS — BP 114/70 | HR 90 | Temp 96.3°F | Resp 20

## 2016-08-18 DIAGNOSIS — C786 Secondary malignant neoplasm of retroperitoneum and peritoneum: Secondary | ICD-10-CM

## 2016-08-18 DIAGNOSIS — C801 Malignant (primary) neoplasm, unspecified: Principal | ICD-10-CM

## 2016-08-18 MED ORDER — SODIUM CHLORIDE 0.9% FLUSH
10.0000 mL | INTRAVENOUS | Status: DC | PRN
  Administered 2016-08-18: 10 mL
  Filled 2016-08-18: qty 10

## 2016-08-18 MED ORDER — HEPARIN SOD (PORK) LOCK FLUSH 100 UNIT/ML IV SOLN
500.0000 [IU] | Freq: Once | INTRAVENOUS | Status: AC | PRN
  Administered 2016-08-18: 500 [IU]
  Filled 2016-08-18: qty 5

## 2016-08-18 MED ORDER — PEGFILGRASTIM INJECTION 6 MG/0.6ML ~~LOC~~
6.0000 mg | PREFILLED_SYRINGE | Freq: Once | SUBCUTANEOUS | Status: AC
  Administered 2016-08-18: 6 mg via SUBCUTANEOUS
  Filled 2016-08-18: qty 0.6

## 2016-08-18 NOTE — Progress Notes (Signed)
Spoke with MD, Dr. Grayland Ormond, via telephone. Per MD order: patient is to receive Neulasta Injection today.

## 2016-08-19 ENCOUNTER — Ambulatory Visit
Admission: RE | Admit: 2016-08-19 | Discharge: 2016-08-19 | Disposition: A | Discharge status: Home / Self Care | Payer: BLUE CROSS/BLUE SHIELD | Source: Ambulatory Visit | Attending: Oncology | Admitting: Oncology

## 2016-08-19 DIAGNOSIS — C786 Secondary malignant neoplasm of retroperitoneum and peritoneum: Secondary | ICD-10-CM | POA: Diagnosis present

## 2016-08-19 DIAGNOSIS — R188 Other ascites: Secondary | ICD-10-CM | POA: Diagnosis not present

## 2016-08-19 DIAGNOSIS — C801 Malignant (primary) neoplasm, unspecified: Secondary | ICD-10-CM | POA: Insufficient documentation

## 2016-08-19 NOTE — Procedures (Signed)
US guided paracentesis.  Removed 4.5 liters of fluid.  No immediate complication.

## 2016-08-22 ENCOUNTER — Other Ambulatory Visit: Payer: Self-pay | Admitting: *Deleted

## 2016-08-29 NOTE — Progress Notes (Signed)
[place]  Telephone:(336) [phone] Fax:(336) [phone]  ID: NAYA STRANO OB: 1960-11-01  MR#: [id number]  [id number]  Patient Care Team: Valerie Roys, DO as PCP - General (Family Medicine) Clent Jacks, RN as Registered Nurse Garnetta Buddy, MD as Consulting Physician (Internal Medicine)  CHIEF COMPLAINT: Peritoneal carcinomatosis, metastatic adenocarcinoma of likely lower GI primary.  INTERVAL HISTORY: Patient returns to clinic today for further evaluation and consideration of cycle 3 of FOLFIRI. She continues to require a paracentesis approximately every 2 weeks. She currently feels bloated. She denies any abdominal pain. She has no neurological complaints. She denies any recent fevers or illnesses.  She has no chest pain or shortness of breath.  She denies and nausea, vomiting, constipation, or diarrhea. She has no melena or hematochezia. She has no urinary complains.  Patient offers no further specific complaints.  REVIEW OF SYSTEMS:   Review of Systems  Constitutional: Negative for fever, malaise/fatigue and weight loss.  Respiratory: Negative.  Negative for cough and shortness of breath.   Cardiovascular: Negative.  Negative for chest pain.  Gastrointestinal: Negative for abdominal pain, blood in stool, constipation, diarrhea, melena and vomiting.       Positive for abdominal bloating  Genitourinary: Negative.   Musculoskeletal: Negative.   Neurological: Positive for sensory change. Negative for weakness.  Psychiatric/Behavioral: The patient is not nervous/anxious.     As per HPI. Otherwise, a complete review of systems is negative.  PAST MEDICAL HISTORY: Past Medical History:  Diagnosis Date  . Acid reflux   . Allergic rhinitis   . Anxiety   . Arthritis    left knee  . Cancer ([place])   . Cervical spondylosis   . CTS (carpal tunnel syndrome)    Right  . Diverticulosis   . HSV-2 (herpes simplex virus 2) infection   . Hyperlipidemia     . Ovarian failure   . Rosacea   . Wears contact lenses     PAST SURGICAL HISTORY: Past Surgical History:  Procedure Laterality Date  . ANTERIOR CRUCIATE LIGAMENT REPAIR Left   . CESAREAN SECTION    . COLONOSCOPY WITH PROPOFOL N/A 01/29/2016   Procedure: COLONOSCOPY WITH PROPOFOL;  Surgeon: Lucilla Lame, MD;  Location: [facility];  Service: Endoscopy;  Laterality: N/A;  . ESOPHAGOGASTRODUODENOSCOPY (EGD) WITH PROPOFOL N/A 01/29/2016   Procedure: ESOPHAGOGASTRODUODENOSCOPY (EGD) WITH PROPOFOL;  Surgeon: Lucilla Lame, MD;  Location: [facility];  Service: Endoscopy;  Laterality: N/A;  . PERIPHERAL VASCULAR CATHETERIZATION N/A 02/10/2016   Procedure: Glori Luis Cath Insertion;  Surgeon: Algernon Huxley, MD;  Location: [facility];  Service: Cardiovascular;  Laterality: N/A;    FAMILY HISTORY: Unknown.  Patient is adopted.     ADVANCED DIRECTIVES:    HEALTH MAINTENANCE: Social History  Substance Use Topics  . Smoking status: Former Research scientist (life sciences)  . Smokeless tobacco: Never Used     Comment: Quit in her 50's  . Alcohol use 0.0 oz/week     Comment: 1 drink/mo     Colonoscopy:  PAP:  Bone density:  Lipid panel:  No Known Allergies  Current Outpatient Prescriptions  Medication Sig Dispense Refill  . acyclovir (ZOVIRAX) 800 MG tablet Take 1 tablet (800 mg total) by mouth daily. 90 tablet 1  . escitalopram (LEXAPRO) 10 MG tablet TAKE 1 TABLET (10 MG TOTAL) BY MOUTH DAILY. 90 tablet 1  . magic mouthwash SOLN Take 5 mLs by mouth 3 (three) times daily as needed for mouth pain. 240 mL  0  . ondansetron (ZOFRAN) 8 MG tablet Take by mouth every 8 (eight) hours as needed for nausea or vomiting.    . pantoprazole (PROTONIX) 40 MG tablet Take 1 tablet (40 mg total) by mouth daily. 30 tablet 2  . prochlorperazine (COMPAZINE) 10 MG tablet Take 10 mg by mouth every 6 (six) hours as needed for nausea or vomiting.     No current facility-administered medications for this visit.     Facility-Administered Medications Ordered in Other Visits  Medication Dose Route Frequency Provider Last Rate Last Dose  . heparin lock flush 100 unit/mL  500 Units Intravenous Once Lloyd Huger, MD      . sodium chloride flush (NS) 0.9 % injection 10 mL  10 mL Intravenous PRN Lloyd Huger, MD   10 mL at 08/16/16 0925    OBJECTIVE: Vitals:   08/30/16 0931  BP: 104/69  Pulse: 90  Resp: 18  Temp: 97.9 F (36.6 C)     Body mass index is 24.99 kg/m.    ECOG FS:0 - Asymptomatic  General: Well-developed, well-nourished, no acute distress. Eyes: Pink conjunctiva, anicteric sclera. Lungs: Clear to auscultation bilaterally. Heart: Regular rate and rhythm. No rubs, murmurs, or gallops. Abdomen: Distended, nontender.  Musculoskeletal: No edema, cyanosis, or clubbing. Neuro: Alert, answering all questions appropriately. Cranial nerves grossly intact. Skin: No rashes or petechiae noted. Psych: Normal affect.   LAB RESULTS:  Lab Results  Component Value Date   NA 136 08/30/2016   K 3.2 (L) 08/30/2016   CL 106 08/30/2016   CO2 21 (L) 08/30/2016   GLUCOSE 104 (H) 08/30/2016   BUN 7 08/30/2016   CREATININE 0.64 08/30/2016   CALCIUM 8.0 (L) 08/30/2016   PROT 5.9 (L) 08/30/2016   ALBUMIN 2.5 (L) 08/30/2016   AST 31 08/30/2016   ALT 12 (L) 08/30/2016   ALKPHOS 128 (H) 08/30/2016   BILITOT 0.6 08/30/2016   GFRNONAA >60 08/30/2016   GFRAA >60 08/30/2016    Lab Results  Component Value Date   WBC 9.1 08/30/2016   NEUTROABS 7.0 (H) 08/30/2016   HGB 10.0 (L) 08/30/2016   HCT 29.3 (L) 08/30/2016   MCV 99.4 08/30/2016   PLT 97 (L) 08/30/2016   Lab Results  Component Value Date   CA125 86.6 (H) 03/29/2016     STUDIES: US Paracentesis  Result Date: 08/19/2016 INDICATION: Peritoneal carcinomatosis and recurrent ascites. EXAM: ULTRASOUND GUIDED PARACENTESIS MEDICATIONS: None. COMPLICATIONS: None immediate. PROCEDURE: Informed written consent was obtained from the  patient after a discussion of the risks, benefits and alternatives to treatment. A timeout was performed prior to the initiation of the procedure. Initial ultrasound scanning demonstrates a large amount of ascites within the right lower abdominal quadrant. The right lower abdomen was prepped and draped in the usual sterile fashion. 1% lidocaine was used for local anesthesia. Following this, a 6 Fr Safe-T-Centesis catheter was introduced. An ultrasound image was saved for documentation purposes. The paracentesis was performed. The catheter was removed and a dressing was applied. The patient tolerated the procedure well without immediate post procedural complication. FINDINGS: A total of approximately 4.5 L of yellow fluid was removed. IMPRESSION: Successful ultrasound-guided paracentesis yielding 4.5 liters of peritoneal fluid. Electronically Signed   By: Markus Daft M.D.   On: 08/19/2016 17:02   US Paracentesis  Result Date: 08/08/2016 CLINICAL DATA:  Recurrent ascites, peritoneal carcinomatosis EXAM: ULTRASOUND GUIDED PARACENTESIS TECHNIQUE: The procedure, risks (including but not limited to bleeding, infection, organ damage ), benefits, and  alternatives were explained to the patient. Questions regarding the procedure were encouraged and answered. The patient understands and consents to the procedure. Survey ultrasound of the abdomen was performed and an appropriate skin entry site in the right lateral abdomen was selected. Skin site was marked, prepped with chlorhexidine, and draped in usual sterile fashion, and infiltrated locally with 1% lidocaine. A Safe-T-Centesis sheath needle was advanced into the peritoneal space until fluid could be aspirated. The sheath was advanced and the needle removed. 2.75 L of clear yellowascites were aspirated. COMPLICATIONS: COMPLICATIONS none IMPRESSION: Technically successful ultrasound guided paracentesis, removing 2.75 L of ascites. Electronically Signed   By: Lucrezia Europe  M.D.   On: 08/08/2016 15:29    ASSESSMENT: Peritoneal carcinomatosis, metastatic adenocarcinoma of likely lower GI primary.  PLAN:    1. Peritoneal carcinomatosis: Pathology results reviewed, immunohistochemistry suggested lower GI primary. CT results from July 11, 2016 essentially revealed stable disease. No malignant cells were seen on the ascitic fluid. Of note, patient had a normal colonoscopy on October 16, 2014 in [place], [place]. Because of suspected progression of disease, patient's treatment was switched to FOLFIRI. Patient had consultation at [facility] recently who determined that no surgical or intraperitoneal chemotherapy would be beneficial. Proceed with cycle 3 of FOLFIRI today. Return to clinic in 2 days for pump removal and then in 2 weeks for consideration of cycle 4. Plan to reimage after cycle 6. 2: Genetic testing: Negative. 3. Anemia: Mild, monitor. 4. Thrombocytopenia: Proceed with treatment as above. 5. Neutropenia: Neulasta with the remainder treatments as above. 6. Peripheral neuropathy: Resolved. Monitor while receiving oxaliplatin. 7. Ascites: Repeat ultrasound-guided paracentesis. Patient appears to require paracentesis every 1-2 weeks.   Patient expressed understanding and was in agreement with this plan. She also understands that She can call clinic at any time with any questions, concerns, or complaints.   Cancer Staging No matching staging information was found for the patient.   Lloyd Huger, MD 09/02/16 11:47 AM

## 2016-08-30 ENCOUNTER — Inpatient Hospital Stay (HOSPITAL_BASED_OUTPATIENT_CLINIC_OR_DEPARTMENT_OTHER): Payer: BLUE CROSS/BLUE SHIELD | Admitting: Oncology

## 2016-08-30 ENCOUNTER — Inpatient Hospital Stay: Payer: BLUE CROSS/BLUE SHIELD

## 2016-08-30 VITALS — BP 104/69 | HR 90 | Temp 97.9°F | Resp 18 | Wt 142.9 lb

## 2016-08-30 DIAGNOSIS — D649 Anemia, unspecified: Secondary | ICD-10-CM | POA: Diagnosis not present

## 2016-08-30 DIAGNOSIS — C801 Malignant (primary) neoplasm, unspecified: Principal | ICD-10-CM

## 2016-08-30 DIAGNOSIS — F419 Anxiety disorder, unspecified: Secondary | ICD-10-CM

## 2016-08-30 DIAGNOSIS — R188 Other ascites: Secondary | ICD-10-CM

## 2016-08-30 DIAGNOSIS — D701 Agranulocytosis secondary to cancer chemotherapy: Secondary | ICD-10-CM | POA: Diagnosis not present

## 2016-08-30 DIAGNOSIS — Z79899 Other long term (current) drug therapy: Secondary | ICD-10-CM

## 2016-08-30 DIAGNOSIS — C786 Secondary malignant neoplasm of retroperitoneum and peritoneum: Secondary | ICD-10-CM

## 2016-08-30 DIAGNOSIS — M1990 Unspecified osteoarthritis, unspecified site: Secondary | ICD-10-CM | POA: Diagnosis not present

## 2016-08-30 DIAGNOSIS — T451X5S Adverse effect of antineoplastic and immunosuppressive drugs, sequela: Secondary | ICD-10-CM

## 2016-08-30 DIAGNOSIS — Z87891 Personal history of nicotine dependence: Secondary | ICD-10-CM

## 2016-08-30 DIAGNOSIS — K219 Gastro-esophageal reflux disease without esophagitis: Secondary | ICD-10-CM | POA: Diagnosis not present

## 2016-08-30 DIAGNOSIS — E785 Hyperlipidemia, unspecified: Secondary | ICD-10-CM | POA: Diagnosis not present

## 2016-08-30 DIAGNOSIS — D696 Thrombocytopenia, unspecified: Secondary | ICD-10-CM | POA: Diagnosis not present

## 2016-08-30 LAB — CBC WITH DIFFERENTIAL/PLATELET
BASOS PCT: 0 %
Basophils Absolute: 0 10*3/uL (ref 0–0.1)
EOS ABS: 0 10*3/uL (ref 0–0.7)
EOS PCT: 0 %
HCT: 29.3 % — ABNORMAL LOW (ref 35.0–47.0)
Hemoglobin: 10 g/dL — ABNORMAL LOW (ref 12.0–16.0)
Lymphocytes Relative: 14 %
Lymphs Abs: 1.2 10*3/uL (ref 1.0–3.6)
MCH: 33.9 pg (ref 26.0–34.0)
MCHC: 34.1 g/dL (ref 32.0–36.0)
MCV: 99.4 fL (ref 80.0–100.0)
MONO ABS: 0.8 10*3/uL (ref 0.2–0.9)
MONOS PCT: 9 %
Neutro Abs: 7 10*3/uL — ABNORMAL HIGH (ref 1.4–6.5)
Neutrophils Relative %: 77 %
Platelets: 97 10*3/uL — ABNORMAL LOW (ref 150–440)
RBC: 2.94 MIL/uL — ABNORMAL LOW (ref 3.80–5.20)
RDW: 18.2 % — AB (ref 11.5–14.5)
WBC: 9.1 10*3/uL (ref 3.6–11.0)

## 2016-08-30 LAB — COMPREHENSIVE METABOLIC PANEL
ALBUMIN: 2.5 g/dL — AB (ref 3.5–5.0)
ALT: 12 U/L — ABNORMAL LOW (ref 14–54)
ANION GAP: 9 (ref 5–15)
AST: 31 U/L (ref 15–41)
Alkaline Phosphatase: 128 U/L — ABNORMAL HIGH (ref 38–126)
BILIRUBIN TOTAL: 0.6 mg/dL (ref 0.3–1.2)
BUN: 7 mg/dL (ref 6–20)
CO2: 21 mmol/L — ABNORMAL LOW (ref 22–32)
Calcium: 8 mg/dL — ABNORMAL LOW (ref 8.9–10.3)
Chloride: 106 mmol/L (ref 101–111)
Creatinine, Ser: 0.64 mg/dL (ref 0.44–1.00)
GFR calc Af Amer: >60 mL/min (ref >60)
GFR calc non Af Amer: >60 mL/min (ref >60)
GLUCOSE: 104 mg/dL — AB (ref 65–99)
POTASSIUM: 3.2 mmol/L — AB (ref 3.5–5.1)
Sodium: 136 mmol/L (ref 135–145)
TOTAL PROTEIN: 5.9 g/dL — AB (ref 6.5–8.1)

## 2016-08-30 MED ORDER — PALONOSETRON HCL INJECTION 0.25 MG/5ML
0.2500 mg | Freq: Once | INTRAVENOUS | Status: AC
  Administered 2016-08-30: 0.25 mg via INTRAVENOUS
  Filled 2016-08-30: qty 5

## 2016-08-30 MED ORDER — FLUOROURACIL CHEMO INJECTION 2.5 GM/50ML
400.0000 mg/m2 | Freq: Once | INTRAVENOUS | Status: AC
  Administered 2016-08-30: 700 mg via INTRAVENOUS
  Filled 2016-08-30: qty 14

## 2016-08-30 MED ORDER — SODIUM CHLORIDE 0.9% FLUSH
10.0000 mL | Freq: Once | INTRAVENOUS | Status: AC
  Administered 2016-08-30: 10 mL via INTRAVENOUS
  Filled 2016-08-30: qty 10

## 2016-08-30 MED ORDER — SODIUM CHLORIDE 0.9 % IV SOLN
Freq: Once | INTRAVENOUS | Status: AC
  Administered 2016-08-30: 11:00:00 via INTRAVENOUS
  Filled 2016-08-30: qty 1000

## 2016-08-30 MED ORDER — DEXTROSE 5 % IV SOLN
180.0000 mg/m2 | Freq: Once | INTRAVENOUS | Status: AC
  Administered 2016-08-30: 300 mg via INTRAVENOUS
  Filled 2016-08-30: qty 15

## 2016-08-30 MED ORDER — DEXAMETHASONE SODIUM PHOSPHATE 10 MG/ML IJ SOLN
10.0000 mg | Freq: Once | INTRAMUSCULAR | Status: AC
  Administered 2016-08-30: 10 mg via INTRAVENOUS
  Filled 2016-08-30: qty 1

## 2016-08-30 MED ORDER — ATROPINE SULFATE 1 MG/ML IJ SOLN
0.5000 mg | Freq: Once | INTRAMUSCULAR | Status: AC | PRN
  Administered 2016-08-30: 0.5 mg via INTRAVENOUS
  Filled 2016-08-30: qty 1

## 2016-08-30 MED ORDER — FLUOROURACIL CHEMO INJECTION 5 GM/100ML
2400.0000 mg/m2 | INTRAVENOUS | Status: DC
  Administered 2016-08-30: 4100 mg via INTRAVENOUS
  Filled 2016-08-30: qty 82

## 2016-08-30 MED ORDER — DEXTROSE 5 % IV SOLN
700.0000 mg | Freq: Once | INTRAVENOUS | Status: AC
  Administered 2016-08-30: 700 mg via INTRAVENOUS
  Filled 2016-08-30: qty 35

## 2016-08-30 NOTE — Progress Notes (Signed)
Patient states she is a little SOB because of the fluid she is carrying in her abdomen.

## 2016-08-30 NOTE — Discharge Instructions (Signed)
Paracentesis, Care After  Refer to this sheet in the next few weeks. These instructions provide you with information about caring for yourself after your procedure. Your health care provider may also give you more specific instructions. Your treatment has been planned according to current medical practices, but problems sometimes occur. Call your health care provider if you have any problems or questions after your procedure.  What can I expect after the procedure?  After your procedure, it is common to have a small amount of clear fluid coming from the puncture site.  Follow these instructions at home:  · Return to your normal activities as told by your health care provider. Ask your health care provider what activities are safe for you.  · Take over-the-counter and prescription medicines only as told by your health care provider.  · Do not take baths, swim, or use a hot tub until your health care provider approves.  · Follow instructions from your health care provider about:  ? How to take care of your puncture site.  ? When and how you should change your bandage (dressing).  ? When you should remove your dressing.  · Check your puncture area every day signs of infection. Watch for:  ? Redness, swelling, or pain.  ? Fluid, blood, or pus.  · Keep all follow-up visits as told by your health care provider. This is important.  Contact a health care provider if:  · You have redness, swelling, or pain at your puncture site.  · You start to have more clear fluid coming from your puncture site.  · You have blood or pus coming from your puncture site.  · You have chills.  · You have a fever.  Get help right away if:  · You develop chest pain or shortness of breath.  · You develop increasing pain, discomfort, or swelling in your abdomen.  · You feel dizzy or light-headed or you pass out.  This information is not intended to replace advice given to you by your health care provider. Make sure you discuss any questions you  have with your health care provider.  Document Released: 11/04/2014 Document Revised: 11/26/2015 Document Reviewed: 09/02/2014  Elsevier Interactive Patient Education © 2017 [employer] Inc[employer]

## 2016-09-01 ENCOUNTER — Inpatient Hospital Stay: Payer: BLUE CROSS/BLUE SHIELD | Attending: Oncology

## 2016-09-01 DIAGNOSIS — D649 Anemia, unspecified: Secondary | ICD-10-CM | POA: Diagnosis not present

## 2016-09-01 DIAGNOSIS — K219 Gastro-esophageal reflux disease without esophagitis: Secondary | ICD-10-CM | POA: Insufficient documentation

## 2016-09-01 DIAGNOSIS — E785 Hyperlipidemia, unspecified: Secondary | ICD-10-CM | POA: Insufficient documentation

## 2016-09-01 DIAGNOSIS — Z79899 Other long term (current) drug therapy: Secondary | ICD-10-CM | POA: Diagnosis not present

## 2016-09-01 DIAGNOSIS — D701 Agranulocytosis secondary to cancer chemotherapy: Secondary | ICD-10-CM | POA: Diagnosis not present

## 2016-09-01 DIAGNOSIS — Z87891 Personal history of nicotine dependence: Secondary | ICD-10-CM | POA: Insufficient documentation

## 2016-09-01 DIAGNOSIS — Z5111 Encounter for antineoplastic chemotherapy: Secondary | ICD-10-CM | POA: Diagnosis not present

## 2016-09-01 DIAGNOSIS — T451X5S Adverse effect of antineoplastic and immunosuppressive drugs, sequela: Secondary | ICD-10-CM | POA: Diagnosis not present

## 2016-09-01 DIAGNOSIS — R188 Other ascites: Secondary | ICD-10-CM | POA: Diagnosis not present

## 2016-09-01 DIAGNOSIS — C786 Secondary malignant neoplasm of retroperitoneum and peritoneum: Secondary | ICD-10-CM | POA: Insufficient documentation

## 2016-09-01 DIAGNOSIS — C801 Malignant (primary) neoplasm, unspecified: Secondary | ICD-10-CM | POA: Insufficient documentation

## 2016-09-01 DIAGNOSIS — D696 Thrombocytopenia, unspecified: Secondary | ICD-10-CM | POA: Insufficient documentation

## 2016-09-01 MED ORDER — PEGFILGRASTIM INJECTION 6 MG/0.6ML ~~LOC~~
6.0000 mg | PREFILLED_SYRINGE | Freq: Once | SUBCUTANEOUS | Status: AC
  Administered 2016-09-01: 6 mg via SUBCUTANEOUS
  Filled 2016-09-01: qty 0.6

## 2016-09-01 MED ORDER — SODIUM CHLORIDE 0.9% FLUSH
10.0000 mL | INTRAVENOUS | Status: DC | PRN
  Administered 2016-09-01: 10 mL via INTRAVENOUS
  Filled 2016-09-01: qty 10

## 2016-09-01 MED ORDER — HEPARIN SOD (PORK) LOCK FLUSH 100 UNIT/ML IV SOLN
500.0000 [IU] | Freq: Once | INTRAVENOUS | Status: AC
  Administered 2016-09-01: 500 [IU] via INTRAVENOUS
  Filled 2016-09-01: qty 5

## 2016-09-02 ENCOUNTER — Ambulatory Visit
Admission: RE | Admit: 2016-09-02 | Discharge: 2016-09-02 | Disposition: A | Discharge status: Home / Self Care | Payer: BLUE CROSS/BLUE SHIELD | Source: Ambulatory Visit | Attending: Oncology | Admitting: Oncology

## 2016-09-02 DIAGNOSIS — R188 Other ascites: Secondary | ICD-10-CM | POA: Diagnosis not present

## 2016-09-02 DIAGNOSIS — C786 Secondary malignant neoplasm of retroperitoneum and peritoneum: Secondary | ICD-10-CM | POA: Diagnosis present

## 2016-09-02 DIAGNOSIS — C801 Malignant (primary) neoplasm, unspecified: Secondary | ICD-10-CM | POA: Diagnosis present

## 2016-09-02 NOTE — Procedures (Signed)
US guided paracentesis.  Removed 4 liters of fluid.  No immediate complication.  See full report in Imaging.

## 2016-09-07 ENCOUNTER — Ambulatory Visit: Payer: BLUE CROSS/BLUE SHIELD | Admitting: Family Medicine

## 2016-09-08 ENCOUNTER — Ambulatory Visit: Payer: Self-pay | Admitting: Family Medicine

## 2016-09-11 NOTE — Progress Notes (Signed)
[place]  Telephone:(336) [phone] Fax:(336) [phone]  ID: Valerie EVOLA OB: [birth year]  MR#: [id number]  UDJ#:[id number]  Patient Care Team: Valerie Roys, DO as PCP - General (Family Medicine) Clent Jacks, RN as Registered Nurse Garnetta Buddy, MD as Consulting Physician (Internal Medicine)  CHIEF COMPLAINT: Peritoneal carcinomatosis, metastatic adenocarcinoma of likely lower GI primary.  INTERVAL HISTORY: Patient returns to clinic today for further evaluation and consideration of cycle 4 of FOLFIRI. She continues to require a paracentesis approximately every 2 weeks. She currently feels bloated. She denies any abdominal pain. She has no neurological complaints. She denies any recent fevers or illnesses.  She has no chest pain or shortness of breath.  She denies and nausea, vomiting, constipation, or diarrhea. She has no melena or hematochezia. She has no urinary complains.  Patient offers no further specific complaints.  REVIEW OF SYSTEMS:   Review of Systems  Constitutional: Negative for fever, malaise/fatigue and weight loss.  Respiratory: Negative.  Negative for cough and shortness of breath.   Cardiovascular: Negative.  Negative for chest pain.  Gastrointestinal: Negative for abdominal pain, blood in stool, constipation, diarrhea, melena and vomiting.       Positive for abdominal bloating  Genitourinary: Negative.   Musculoskeletal: Negative.   Neurological: Positive for sensory change. Negative for weakness.  Psychiatric/Behavioral: The patient is not nervous/anxious.     As per HPI. Otherwise, a complete review of systems is negative.  PAST MEDICAL HISTORY: Past Medical History:  Diagnosis Date  . Acid reflux   . Allergic rhinitis   . Anxiety   . Arthritis    left knee  . Cancer ([place])   . Cervical spondylosis   . CTS (carpal tunnel syndrome)    Right  . Diverticulosis   . HSV-2 (herpes simplex virus 2) infection   . Hyperlipidemia     . Ovarian failure   . Rosacea   . Wears contact lenses     PAST SURGICAL HISTORY: Past Surgical History:  Procedure Laterality Date  . ANTERIOR CRUCIATE LIGAMENT REPAIR Left   . CESAREAN SECTION    . COLONOSCOPY WITH PROPOFOL N/A 01/29/2016   Procedure: COLONOSCOPY WITH PROPOFOL;  Surgeon: Lucilla Lame, MD;  Location: [facility];  Service: Endoscopy;  Laterality: N/A;  . ESOPHAGOGASTRODUODENOSCOPY (EGD) WITH PROPOFOL N/A 01/29/2016   Procedure: ESOPHAGOGASTRODUODENOSCOPY (EGD) WITH PROPOFOL;  Surgeon: Lucilla Lame, MD;  Location: [facility];  Service: Endoscopy;  Laterality: N/A;  . PERIPHERAL VASCULAR CATHETERIZATION N/A 02/10/2016   Procedure: Glori Luis Cath Insertion;  Surgeon: Algernon Huxley, MD;  Location: [facility];  Service: Cardiovascular;  Laterality: N/A;    FAMILY HISTORY: Unknown.  Patient is adopted.     ADVANCED DIRECTIVES:    HEALTH MAINTENANCE: Social History  Substance Use Topics  . Smoking status: Former Research scientist (life sciences)  . Smokeless tobacco: Never Used     Comment: Quit in her 91's  . Alcohol use 0.0 oz/week     Comment: 1 drink/mo     Colonoscopy:  PAP:  Bone density:  Lipid panel:  No Known Allergies  Current Outpatient Prescriptions  Medication Sig Dispense Refill  . acyclovir (ZOVIRAX) 800 MG tablet Take 1 tablet (800 mg total) by mouth daily. 90 tablet 1  . escitalopram (LEXAPRO) 10 MG tablet TAKE 1 TABLET (10 MG TOTAL) BY MOUTH DAILY. 90 tablet 1  . magic mouthwash SOLN Take 5 mLs by mouth 3 (three) times daily as needed for mouth pain. 240 mL  0  . ondansetron (ZOFRAN) 8 MG tablet Take by mouth every 8 (eight) hours as needed for nausea or vomiting.    . pantoprazole (PROTONIX) 40 MG tablet Take 1 tablet (40 mg total) by mouth daily. 30 tablet 2  . prochlorperazine (COMPAZINE) 10 MG tablet Take 10 mg by mouth every 6 (six) hours as needed for nausea or vomiting.     No current facility-administered medications for this visit.     Facility-Administered Medications Ordered in Other Visits  Medication Dose Route Frequency Provider Last Rate Last Dose  . heparin lock flush 100 unit/mL  500 Units Intravenous Once Lloyd Huger, MD      . sodium chloride flush (NS) 0.9 % injection 10 mL  10 mL Intravenous PRN Lloyd Huger, MD   10 mL at 08/16/16 0925    OBJECTIVE: Vitals:   09/13/16 0941  BP: 109/70  Pulse: 89  Resp: 18  Temp: 97.2 F (36.2 C)     Body mass index is 24.37 kg/m.    ECOG FS:0 - Asymptomatic  General: Well-developed, well-nourished, no acute distress. Eyes: Pink conjunctiva, anicteric sclera. Lungs: Clear to auscultation bilaterally. Heart: Regular rate and rhythm. No rubs, murmurs, or gallops. Abdomen: Distended, nontender.  Musculoskeletal: No edema, cyanosis, or clubbing. Neuro: Alert, answering all questions appropriately. Cranial nerves grossly intact. Skin: No rashes or petechiae noted. Psych: Normal affect.   LAB RESULTS:  Lab Results  Component Value Date   NA 134 (L) 09/13/2016   K 3.3 (L) 09/13/2016   CL 105 09/13/2016   CO2 24 09/13/2016   GLUCOSE 112 (H) 09/13/2016   BUN 10 09/13/2016   CREATININE 0.65 09/13/2016   CALCIUM 8.0 (L) 09/13/2016   PROT 5.7 (L) 09/13/2016   ALBUMIN 2.4 (L) 09/13/2016   AST 21 09/13/2016   ALT 12 (L) 09/13/2016   ALKPHOS 115 09/13/2016   BILITOT 0.4 09/13/2016   GFRNONAA >60 09/13/2016   GFRAA >60 09/13/2016    Lab Results  Component Value Date   WBC 7.8 09/13/2016   NEUTROABS 5.8 09/13/2016   HGB 9.8 (L) 09/13/2016   HCT 28.5 (L) 09/13/2016   MCV 99.1 09/13/2016   PLT 107 (L) 09/13/2016   Lab Results  Component Value Date   CA125 86.6 (H) 03/29/2016     STUDIES: US Paracentesis  Result Date: 09/02/2016 INDICATION: Peritoneal carcinomatosis and recurrent ascites. EXAM: ULTRASOUND GUIDED PARACENTESIS MEDICATIONS: None. COMPLICATIONS: None immediate. PROCEDURE: Informed written consent was obtained from the patient  after a discussion of the risks, benefits and alternatives to treatment. A timeout was performed prior to the initiation of the procedure. Initial ultrasound scanning demonstrates a large amount of ascites within the right lower abdominal quadrant. The right lower abdomen was prepped and draped in the usual sterile fashion. 1% lidocaine was used for local anesthesia. Following this, a 6 Fr Safe-T-Centesis catheter was introduced. An ultrasound image was saved for documentation purposes. The paracentesis was performed. The catheter was removed and a dressing was applied. The patient tolerated the procedure well without immediate post procedural complication. FINDINGS: A total of approximately 4.0 L of yellow fluid was removed. IMPRESSION: Successful ultrasound-guided paracentesis yielding 4 liters of peritoneal fluid. Electronically Signed   By: Markus Daft M.D.   On: 09/02/2016 17:14   US Paracentesis  Result Date: 08/19/2016 INDICATION: Peritoneal carcinomatosis and recurrent ascites. EXAM: ULTRASOUND GUIDED PARACENTESIS MEDICATIONS: None. COMPLICATIONS: None immediate. PROCEDURE: Informed written consent was obtained from the patient after a discussion of the  risks, benefits and alternatives to treatment. A timeout was performed prior to the initiation of the procedure. Initial ultrasound scanning demonstrates a large amount of ascites within the right lower abdominal quadrant. The right lower abdomen was prepped and draped in the usual sterile fashion. 1% lidocaine was used for local anesthesia. Following this, a 6 Fr Safe-T-Centesis catheter was introduced. An ultrasound image was saved for documentation purposes. The paracentesis was performed. The catheter was removed and a dressing was applied. The patient tolerated the procedure well without immediate post procedural complication. FINDINGS: A total of approximately 4.5 L of yellow fluid was removed. IMPRESSION: Successful ultrasound-guided paracentesis  yielding 4.5 liters of peritoneal fluid. Electronically Signed   By: Markus Daft M.D.   On: 08/19/2016 17:02    ASSESSMENT: Peritoneal carcinomatosis, metastatic adenocarcinoma of likely lower GI primary.  PLAN:    1. Peritoneal carcinomatosis: Pathology results reviewed, immunohistochemistry suggested lower GI primary. CT results from July 11, 2016 essentially revealed stable disease. No malignant cells were seen on the ascitic fluid. Of note, patient had a normal colonoscopy on October 16, 2014 in [place], [place]. Because of suspected progression of disease, patient's treatment was switched to FOLFIRI. Patient had consultation at [facility] recently who determined that no surgical or intraperitoneal chemotherapy would be beneficial. Proceed with cycle 4 of FOLFIRI today. We will not add Avastin since this seems to be no obvious benefit. Foundation 1 testings percent by [employer] and are pending at time of dictation. Patient also states she may go to [facility] for consideration of clinical trial.  Return to clinic in 2 days for pump removal and Neulasta and then in 2 weeks for consideration of cycle 5. Plan to reimage after cycle 6. 2: Genetic testing: Negative. 3. Anemia: Mild, monitor. 4. Thrombocytopenia: Proceed with treatment as above. 5. Neutropenia: Neulasta with the remainder treatments as above. 6. Peripheral neuropathy: Resolved. Monitor while receiving oxaliplatin. 7. Ascites: Repeat ultrasound-guided paracentesis. Patient appears to require paracentesis every 2 weeks.   Patient expressed understanding and was in agreement with this plan. She also understands that She can call clinic at any time with any questions, concerns, or complaints.   Cancer Staging No matching staging information was found for the patient.   Lloyd Huger, MD 09/13/16 9:43 AM

## 2016-09-13 ENCOUNTER — Inpatient Hospital Stay: Payer: BLUE CROSS/BLUE SHIELD

## 2016-09-13 ENCOUNTER — Inpatient Hospital Stay (HOSPITAL_BASED_OUTPATIENT_CLINIC_OR_DEPARTMENT_OTHER): Payer: BLUE CROSS/BLUE SHIELD | Admitting: Oncology

## 2016-09-13 VITALS — BP 109/70 | HR 89 | Temp 97.2°F | Resp 18 | Wt 139.3 lb

## 2016-09-13 DIAGNOSIS — C801 Malignant (primary) neoplasm, unspecified: Secondary | ICD-10-CM

## 2016-09-13 DIAGNOSIS — E785 Hyperlipidemia, unspecified: Secondary | ICD-10-CM

## 2016-09-13 DIAGNOSIS — D701 Agranulocytosis secondary to cancer chemotherapy: Secondary | ICD-10-CM | POA: Diagnosis not present

## 2016-09-13 DIAGNOSIS — C786 Secondary malignant neoplasm of retroperitoneum and peritoneum: Secondary | ICD-10-CM

## 2016-09-13 DIAGNOSIS — T451X5S Adverse effect of antineoplastic and immunosuppressive drugs, sequela: Secondary | ICD-10-CM | POA: Diagnosis not present

## 2016-09-13 DIAGNOSIS — D649 Anemia, unspecified: Secondary | ICD-10-CM

## 2016-09-13 DIAGNOSIS — R188 Other ascites: Secondary | ICD-10-CM

## 2016-09-13 DIAGNOSIS — Z79899 Other long term (current) drug therapy: Secondary | ICD-10-CM | POA: Diagnosis not present

## 2016-09-13 DIAGNOSIS — K219 Gastro-esophageal reflux disease without esophagitis: Secondary | ICD-10-CM

## 2016-09-13 DIAGNOSIS — Z87891 Personal history of nicotine dependence: Secondary | ICD-10-CM | POA: Diagnosis not present

## 2016-09-13 DIAGNOSIS — D696 Thrombocytopenia, unspecified: Secondary | ICD-10-CM

## 2016-09-13 LAB — COMPREHENSIVE METABOLIC PANEL
ALK PHOS: 115 U/L (ref 38–126)
ALT: 12 U/L — ABNORMAL LOW (ref 14–54)
ANION GAP: 5 (ref 5–15)
AST: 21 U/L (ref 15–41)
Albumin: 2.4 g/dL — ABNORMAL LOW (ref 3.5–5.0)
BILIRUBIN TOTAL: 0.4 mg/dL (ref 0.3–1.2)
BUN: 10 mg/dL (ref 6–20)
CALCIUM: 8 mg/dL — AB (ref 8.9–10.3)
CO2: 24 mmol/L (ref 22–32)
Chloride: 105 mmol/L (ref 101–111)
Creatinine, Ser: 0.65 mg/dL (ref 0.44–1.00)
GFR calc non Af Amer: >60 mL/min (ref >60)
Glucose, Bld: 112 mg/dL — ABNORMAL HIGH (ref 65–99)
Potassium: 3.3 mmol/L — ABNORMAL LOW (ref 3.5–5.1)
Sodium: 134 mmol/L — ABNORMAL LOW (ref 135–145)
TOTAL PROTEIN: 5.7 g/dL — AB (ref 6.5–8.1)

## 2016-09-13 LAB — CBC WITH DIFFERENTIAL/PLATELET
Basophils Absolute: 0.1 10*3/uL (ref 0–0.1)
Basophils Relative: 1 %
EOS ABS: 0.1 10*3/uL (ref 0–0.7)
Eosinophils Relative: 1 %
HEMATOCRIT: 28.5 % — AB (ref 35.0–47.0)
HEMOGLOBIN: 9.8 g/dL — AB (ref 12.0–16.0)
LYMPHS ABS: 1.2 10*3/uL (ref 1.0–3.6)
Lymphocytes Relative: 16 %
MCH: 34 pg (ref 26.0–34.0)
MCHC: 34.3 g/dL (ref 32.0–36.0)
MCV: 99.1 fL (ref 80.0–100.0)
MONO ABS: 0.7 10*3/uL (ref 0.2–0.9)
MONOS PCT: 9 %
NEUTROS ABS: 5.8 10*3/uL (ref 1.4–6.5)
NEUTROS PCT: 73 %
Platelets: 107 10*3/uL — ABNORMAL LOW (ref 150–440)
RBC: 2.88 MIL/uL — ABNORMAL LOW (ref 3.80–5.20)
RDW: 18.6 % — AB (ref 11.5–14.5)
WBC: 7.8 10*3/uL (ref 3.6–11.0)

## 2016-09-13 MED ORDER — SODIUM CHLORIDE 0.9 % IV SOLN: 10.0000 mg | Freq: Once | INTRAVENOUS | Status: DC

## 2016-09-13 MED ORDER — IRINOTECAN HCL CHEMO INJECTION 100 MG/5ML
180.0000 mg/m2 | Freq: Once | INTRAVENOUS | Status: AC
  Administered 2016-09-13: 300 mg via INTRAVENOUS
  Filled 2016-09-13: qty 15

## 2016-09-13 MED ORDER — FLUOROURACIL CHEMO INJECTION 2.5 GM/50ML
400.0000 mg/m2 | Freq: Once | INTRAVENOUS | Status: AC
  Administered 2016-09-13: 700 mg via INTRAVENOUS
  Filled 2016-09-13: qty 14

## 2016-09-13 MED ORDER — LEUCOVORIN CALCIUM INJECTION 350 MG
700.0000 mg | Freq: Once | INTRAVENOUS | Status: AC
  Administered 2016-09-13: 700 mg via INTRAVENOUS
  Filled 2016-09-13: qty 35

## 2016-09-13 MED ORDER — DEXAMETHASONE SODIUM PHOSPHATE 10 MG/ML IJ SOLN
10.0000 mg | Freq: Once | INTRAMUSCULAR | Status: AC
  Administered 2016-09-13: 10 mg via INTRAVENOUS
  Filled 2016-09-13: qty 1

## 2016-09-13 MED ORDER — LIDOCAINE-PRILOCAINE 2.5-2.5 % EX CREA: 1.0000 "application " | TOPICAL_CREAM | CUTANEOUS | 3 refills | Status: AC | PRN

## 2016-09-13 MED ORDER — SODIUM CHLORIDE 0.9 % IV SOLN
Freq: Once | INTRAVENOUS | Status: AC
  Administered 2016-09-13: 10:00:00 via INTRAVENOUS
  Filled 2016-09-13: qty 1000

## 2016-09-13 MED ORDER — HEPARIN SOD (PORK) LOCK FLUSH 100 UNIT/ML IV SOLN
500.0000 [IU] | Freq: Once | INTRAVENOUS | Status: DC | PRN
  Filled 2016-09-13: qty 5

## 2016-09-13 MED ORDER — FLUOROURACIL CHEMO INJECTION 5 GM/100ML
2400.0000 mg/m2 | INTRAVENOUS | Status: DC
  Administered 2016-09-13: 4100 mg via INTRAVENOUS
  Filled 2016-09-13: qty 82

## 2016-09-13 MED ORDER — GABAPENTIN 300 MG PO CAPS: 300.0000 mg | ORAL_CAPSULE | Freq: Two times a day (BID) | ORAL | 3 refills | Status: DC

## 2016-09-13 MED ORDER — PALONOSETRON HCL INJECTION 0.25 MG/5ML
0.2500 mg | Freq: Once | INTRAVENOUS | Status: AC
  Administered 2016-09-13: 0.25 mg via INTRAVENOUS
  Filled 2016-09-13: qty 5

## 2016-09-13 MED ORDER — ATROPINE SULFATE 1 MG/ML IJ SOLN
0.5000 mg | Freq: Once | INTRAMUSCULAR | Status: AC | PRN
  Administered 2016-09-13: 0.5 mg via INTRAVENOUS
  Filled 2016-09-13: qty 1

## 2016-09-13 NOTE — Progress Notes (Signed)
Complains of intermittent sharp abd pain and cramping. Having left lower extremity swelling, no pain and redness are noted. Pt questions how can increase albumin levels since is seeking second opinion at [facility] in [place], [place]. Informed pt will ask dietician and let her know.

## 2016-09-15 ENCOUNTER — Inpatient Hospital Stay: Payer: BLUE CROSS/BLUE SHIELD

## 2016-09-15 DIAGNOSIS — C786 Secondary malignant neoplasm of retroperitoneum and peritoneum: Secondary | ICD-10-CM | POA: Diagnosis not present

## 2016-09-15 DIAGNOSIS — C801 Malignant (primary) neoplasm, unspecified: Principal | ICD-10-CM

## 2016-09-15 MED ORDER — PEGFILGRASTIM INJECTION 6 MG/0.6ML ~~LOC~~
6.0000 mg | PREFILLED_SYRINGE | Freq: Once | SUBCUTANEOUS | Status: AC
  Administered 2016-09-15: 6 mg via SUBCUTANEOUS
  Filled 2016-09-15: qty 0.6

## 2016-09-15 MED ORDER — HEPARIN SOD (PORK) LOCK FLUSH 100 UNIT/ML IV SOLN
500.0000 [IU] | Freq: Once | INTRAVENOUS | Status: AC | PRN
  Administered 2016-09-15: 500 [IU]
  Filled 2016-09-15: qty 5

## 2016-09-15 MED ORDER — SODIUM CHLORIDE 0.9% FLUSH
10.0000 mL | INTRAVENOUS | Status: DC | PRN
  Administered 2016-09-15: 10 mL
  Filled 2016-09-15: qty 10

## 2016-09-16 ENCOUNTER — Ambulatory Visit
Admission: RE | Admit: 2016-09-16 | Discharge: 2016-09-16 | Disposition: A | Discharge status: Home / Self Care | Payer: BLUE CROSS/BLUE SHIELD | Source: Ambulatory Visit | Attending: Oncology | Admitting: Oncology

## 2016-09-16 DIAGNOSIS — C786 Secondary malignant neoplasm of retroperitoneum and peritoneum: Secondary | ICD-10-CM | POA: Diagnosis not present

## 2016-09-16 DIAGNOSIS — C801 Malignant (primary) neoplasm, unspecified: Secondary | ICD-10-CM | POA: Diagnosis present

## 2016-09-16 DIAGNOSIS — R188 Other ascites: Secondary | ICD-10-CM | POA: Diagnosis not present

## 2016-09-19 ENCOUNTER — Encounter: Payer: Self-pay | Admitting: Oncology

## 2016-09-20 ENCOUNTER — Ambulatory Visit (INDEPENDENT_AMBULATORY_CARE_PROVIDER_SITE_OTHER): Payer: BLUE CROSS/BLUE SHIELD | Admitting: Family Medicine

## 2016-09-20 ENCOUNTER — Encounter: Payer: Self-pay | Admitting: Family Medicine

## 2016-09-20 VITALS — BP 103/71 | HR 83 | Temp 98.9°F | Resp 17 | Ht 63.4 in | Wt 130.0 lb

## 2016-09-20 DIAGNOSIS — E782 Mixed hyperlipidemia: Secondary | ICD-10-CM

## 2016-09-20 DIAGNOSIS — B009 Herpesviral infection, unspecified: Secondary | ICD-10-CM

## 2016-09-20 DIAGNOSIS — F411 Generalized anxiety disorder: Secondary | ICD-10-CM

## 2016-09-20 MED ORDER — ESCITALOPRAM OXALATE 10 MG PO TABS: ORAL_TABLET | ORAL | 1 refills | Status: DC

## 2016-09-20 MED ORDER — ACYCLOVIR 800 MG PO TABS: 800.0000 mg | ORAL_TABLET | Freq: Every day | ORAL | 1 refills | Status: DC

## 2016-09-20 NOTE — Assessment & Plan Note (Signed)
Stable. No problems. Refill given today.

## 2016-09-20 NOTE — Progress Notes (Signed)
BP 103/71 (BP Location: Left Arm, Patient Position: Sitting, Cuff Size: Normal)   Pulse 83   Temp 98.9 F (37.2 C) (Oral)   Resp 17   Ht 5' 3.4" (1.61 m)   Wt 130 lb (59 kg)   SpO2 100%   BMI 22.74 kg/m    Subjective:    Patient ID: Valerie Horton, female    DOB: [birth year], 58 y.o.   MRN: [id number]  HPI: Valerie Horton is a 56 y.o. female  Chief Complaint  Patient presents with  . Anxiety   Has been following with the cancer center. She has been requiring paracentesis every 2 weeks or so. Continues with chemo. They think that the cancer is metastatic adenocarcinoma of a likely lower GI primary. Last CT done in January 2018 showed stable disease. No malignant cells on ascitic fluid. She has been switched to FOLFIRI because of progression of disease and has had a consult at [facility] who did not recommend surgical therapy or intraperotoneal chemo. She is considering going to [facility] for a clinical trial. They are going to re-image her after her 6th cycle and is going to have her 5th cycle in a week.   ANXIETY/STRESS Duration:controlled Anxious mood: no  Excessive worrying: no Irritability: no  Sweating: no Nausea: no Palpitations:no Hyperventilation: no Panic attacks: no Agoraphobia: no  Obscessions/compulsions: no Depressed mood: no Anhedonia: no Weight changes: no Insomnia: no   Hypersomnia: no Fatigue/loss of energy: no Feelings of worthlessness: no Feelings of guilt: no Impaired concentration/indecisiveness: no Suicidal ideations: no  Crying spells: no Recent Stressors/Life Changes: yes  Remembering to take her acyclovir when she can. Has not had any outbreaks. Feeling well.   HYPERLIPIDEMIA Hyperlipidemia status: Due for recheck today Satisfied with current treatment?  yes Side effects:  Not on anything Medication compliance: Not on anything Past cholesterol meds: none Supplements: none Aspirin:  no Chest pain:  no Coronary  artery disease:  no Family history CAD:  no  Relevant past medical, surgical, family and social history reviewed and updated as indicated. Interim medical history since our last visit reviewed. Allergies and medications reviewed and updated.  Review of Systems  Constitutional: Negative.   Respiratory: Negative.   Cardiovascular: Negative.   Gastrointestinal: Positive for abdominal distention.  Skin: Negative.   Psychiatric/Behavioral: Negative.     Per HPI unless specifically indicated above     Objective:    BP 103/71 (BP Location: Left Arm, Patient Position: Sitting, Cuff Size: Normal)   Pulse 83   Temp 98.9 F (37.2 C) (Oral)   Resp 17   Ht 5' 3.4" (1.61 m)   Wt 130 lb (59 kg)   SpO2 100%   BMI 22.74 kg/m   Wt Readings from Last 3 Encounters:  09/20/16 130 lb (59 kg)  09/13/16 139 lb 5.3 oz (63.2 kg)  08/30/16 142 lb 13.7 oz (64.8 kg)    Physical Exam  Constitutional: She is oriented to person, place, and time. She appears well-developed and well-nourished. No distress.  HENT:  Head: Normocephalic and atraumatic.  Right Ear: Hearing normal.  Left Ear: Hearing normal.  Nose: Nose normal.  Eyes: Conjunctivae and lids are normal. Right eye exhibits no discharge. Left eye exhibits no discharge. No scleral icterus.  Cardiovascular: Normal rate, regular rhythm, normal heart sounds and intact distal pulses.  Exam reveals no gallop and no friction rub.   No murmur heard. Pulmonary/Chest: Effort normal and breath sounds normal. No respiratory distress. She  has no wheezes. She has no rales. She exhibits no tenderness.  Musculoskeletal: Normal range of motion.  Neurological: She is alert and oriented to person, place, and time.  Skin: Skin is warm, dry and intact. No rash noted. No erythema. No pallor.  Psychiatric: She has a normal mood and affect. Her speech is normal and behavior is normal. Judgment and thought content normal. Cognition and memory are normal.  Nursing  note and vitals reviewed.   Results for orders placed or performed in visit on 09/13/16  CBC with Differential  Result Value Ref Range   WBC 7.8 3.6 - 11.0 K/uL   RBC 2.88 (L) 3.80 - 5.20 MIL/uL   Hemoglobin 9.8 (L) 12.0 - 16.0 g/dL   HCT 28.5 (L) 35.0 - 47.0 %   MCV 99.1 80.0 - 100.0 fL   MCH 34.0 26.0 - 34.0 pg   MCHC 34.3 32.0 - 36.0 g/dL   RDW 18.6 (H) 11.5 - 14.5 %   Platelets 107 (L) 150 - 440 K/uL   Neutrophils Relative % 73 %   Neutro Abs 5.8 1.4 - 6.5 K/uL   Lymphocytes Relative 16 %   Lymphs Abs 1.2 1.0 - 3.6 K/uL   Monocytes Relative 9 %   Monocytes Absolute 0.7 0.2 - 0.9 K/uL   Eosinophils Relative 1 %   Eosinophils Absolute 0.1 0 - 0.7 K/uL   Basophils Relative 1 %   Basophils Absolute 0.1 0 - 0.1 K/uL  Comprehensive metabolic panel  Result Value Ref Range   Sodium 134 (L) 135 - 145 mmol/L   Potassium 3.3 (L) 3.5 - 5.1 mmol/L   Chloride 105 101 - 111 mmol/L   CO2 24 22 - 32 mmol/L   Glucose, Bld 112 (H) 65 - 99 mg/dL   BUN 10 6 - 20 mg/dL   Creatinine, Ser 0.65 0.44 - 1.00 mg/dL   Calcium 8.0 (L) 8.9 - 10.3 mg/dL   Total Protein 5.7 (L) 6.5 - 8.1 g/dL   Albumin 2.4 (L) 3.5 - 5.0 g/dL   AST 21 15 - 41 U/L   ALT 12 (L) 14 - 54 U/L   Alkaline Phosphatase 115 38 - 126 U/L   Total Bilirubin 0.4 0.3 - 1.2 mg/dL   GFR calc non Af Amer >60 >60 mL/min   GFR calc Af Amer >60 >60 mL/min   Anion gap 5 5 - 15      Assessment & Plan:   Problem List Items Addressed This Visit      Other   Anxiety disorder - Primary    Under good control. Continue current regimen. Continue to monitor. Refill given today.      Relevant Orders   TSH   HSV-2 (herpes simplex virus 2) infection    Stable. No problems. Refill given today.      Relevant Medications   acyclovir (ZOVIRAX) 800 MG tablet   Hyperlipidemia    Rechecking levels today. Await results. Call with any concerns.       Relevant Orders   Lipid Panel w/o Chol/HDL Ratio       Follow up plan: Return in  about 6 months (around 03/23/2017) for Anxiety and HLD follow up.

## 2016-09-20 NOTE — Assessment & Plan Note (Signed)
Under good control. Continue current regimen. Continue to monitor. Refill given today.

## 2016-09-20 NOTE — Assessment & Plan Note (Signed)
Rechecking levels today. Await results. Call with any concerns.  

## 2016-09-21 LAB — LIPID PANEL W/O CHOL/HDL RATIO
CHOLESTEROL TOTAL: 171 mg/dL (ref 100–199)
HDL: 69 mg/dL (ref >39)
LDL CALC: 87 mg/dL (ref 0–99)
Triglycerides: 75 mg/dL (ref 0–149)
VLDL Cholesterol Cal: 15 mg/dL (ref 5–40)

## 2016-09-21 LAB — TSH: TSH: 3.98 u[IU]/mL (ref 0.450–4.500)

## 2016-09-25 NOTE — Progress Notes (Signed)
[facility]  [phone]) [phone] [phone]) [phone]  ID: Valerie Horton OB: May 07, 1961  MR#: [id number]  JKK#:[id number]  Patient Care Team: Valerie Roys, DO as PCP - General (Family Medicine) Clent Jacks, RN as Registered Nurse Garnetta Buddy, MD as Consulting Physician (Internal Medicine)  CHIEF COMPLAINT: Peritoneal carcinomatosis, metastatic adenocarcinoma of likely lower GI primary.  INTERVAL HISTORY: Patient returns to clinic today for further evaluation and consideration of cycle 5 of FOLFIRI. She continues to require a paracentesis approximately every 2 weeks, but this is improving. She denies any abdominal pain. She has no neurological complaints. She denies any recent fevers or illnesses.  She has no chest pain or shortness of breath.  She denies and nausea, vomiting, constipation, or diarrhea. She has no melena or hematochezia. She has no urinary complains.  Patient offers no further specific complaints.  REVIEW OF SYSTEMS:   Review of Systems  Constitutional: Negative for fever, malaise/fatigue and weight loss.  Respiratory: Negative.  Negative for cough and shortness of breath.   Cardiovascular: Negative.  Negative for chest pain.  Gastrointestinal: Negative for abdominal pain, blood in stool, constipation, diarrhea, melena and vomiting.       Positive for abdominal bloating  Genitourinary: Negative.   Musculoskeletal: Negative.   Neurological: Positive for sensory change. Negative for weakness.  Psychiatric/Behavioral: The patient is not nervous/anxious.     As per HPI. Otherwise, a complete review of systems is negative.  PAST MEDICAL HISTORY: Past Medical History:  Diagnosis Date  . Acid reflux   . Allergic rhinitis   . Anxiety   . Arthritis    left knee  . Cancer ([place])   . Cervical spondylosis   . CTS (carpal tunnel syndrome)    Right  . Diverticulosis   . HSV-2 (herpes simplex virus 2) infection   . Hyperlipidemia   .  Ovarian failure   . Rosacea   . Wears contact lenses     PAST SURGICAL HISTORY: Past Surgical History:  Procedure Laterality Date  . ANTERIOR CRUCIATE LIGAMENT REPAIR Left   . CESAREAN SECTION    . COLONOSCOPY WITH PROPOFOL N/A 01/29/2016   Procedure: COLONOSCOPY WITH PROPOFOL;  Surgeon: Lucilla Lame, MD;  Location: [facility];  Service: Endoscopy;  Laterality: N/A;  . ESOPHAGOGASTRODUODENOSCOPY (EGD) WITH PROPOFOL N/A 01/29/2016   Procedure: ESOPHAGOGASTRODUODENOSCOPY (EGD) WITH PROPOFOL;  Surgeon: Lucilla Lame, MD;  Location: [facility];  Service: Endoscopy;  Laterality: N/A;  . PERIPHERAL VASCULAR CATHETERIZATION N/A 02/10/2016   Procedure: Glori Luis Cath Insertion;  Surgeon: Algernon Huxley, MD;  Location: [facility];  Service: Cardiovascular;  Laterality: N/A;    FAMILY HISTORY: Unknown.  Patient is adopted.     ADVANCED DIRECTIVES:    HEALTH MAINTENANCE: Social History  Substance Use Topics  . Smoking status: Former Research scientist (life sciences)  . Smokeless tobacco: Never Used     Comment: Quit in her 64's  . Alcohol use 0.0 oz/week     Comment: 1 drink/mo     Colonoscopy:  PAP:  Bone density:  Lipid panel:  No Known Allergies  Current Outpatient Prescriptions  Medication Sig Dispense Refill  . acyclovir (ZOVIRAX) 800 MG tablet Take 1 tablet (800 mg total) by mouth daily. 90 tablet 1  . escitalopram (LEXAPRO) 10 MG tablet TAKE 1 TABLET (10 MG TOTAL) BY MOUTH DAILY. 90 tablet 1  . lidocaine-prilocaine (EMLA) cream Apply 1 application topically as needed. 30 g 3  . ondansetron (ZOFRAN) 8 MG tablet Take  by mouth every 8 (eight) hours as needed for nausea or vomiting.    . pantoprazole (PROTONIX) 40 MG tablet Take 1 tablet (40 mg total) by mouth daily. 30 tablet 2  . prochlorperazine (COMPAZINE) 10 MG tablet Take 10 mg by mouth every 6 (six) hours as needed for nausea or vomiting.     No current facility-administered medications for this visit.    Facility-Administered  Medications Ordered in Other Visits  Medication Dose Route Frequency Provider Last Rate Last Dose  . heparin lock flush 100 unit/mL  500 Units Intravenous Once Lloyd Huger, MD      . sodium chloride flush (NS) 0.9 % injection 10 mL  10 mL Intravenous PRN Lloyd Huger, MD   10 mL at 08/16/16 0925    OBJECTIVE: Vitals:   09/27/16 1028  BP: 108/65  Pulse: 92  Resp: 18  Temp: 97.8 F (36.6 C)     Body mass index is 22.44 kg/m.    ECOG FS:0 - Asymptomatic  General: Well-developed, well-nourished, no acute distress. Eyes: Pink conjunctiva, anicteric sclera. Lungs: Clear to auscultation bilaterally. Heart: Regular rate and rhythm. No rubs, murmurs, or gallops. Abdomen: Distended, nontender.  Musculoskeletal: No edema, cyanosis, or clubbing. Neuro: Alert, answering all questions appropriately. Cranial nerves grossly intact. Skin: No rashes or petechiae noted. Psych: Normal affect.   LAB RESULTS:  Lab Results  Component Value Date   NA 135 09/27/2016   K 3.4 (L) 09/27/2016   CL 104 09/27/2016   CO2 25 09/27/2016   GLUCOSE 102 (H) 09/27/2016   BUN 10 09/27/2016   CREATININE 0.66 09/27/2016   CALCIUM 8.3 (L) 09/27/2016   PROT 6.1 (L) 09/27/2016   ALBUMIN 2.7 (L) 09/27/2016   AST 25 09/27/2016   ALT 14 09/27/2016   ALKPHOS 126 09/27/2016   BILITOT 0.5 09/27/2016   GFRNONAA >60 09/27/2016   GFRAA >60 09/27/2016    Lab Results  Component Value Date   WBC 9.2 09/27/2016   NEUTROABS 7.0 (H) 09/27/2016   HGB 9.8 (L) 09/27/2016   HCT 28.9 (L) 09/27/2016   MCV 99.6 09/27/2016   PLT 101 (L) 09/27/2016   Lab Results  Component Value Date   CA125 86.6 (H) 03/29/2016     STUDIES: US Paracentesis  Result Date: 09/16/2016 CLINICAL DATA:  Peritoneal carcinomatosis, recurrent ascites and abdominal distention. EXAM: ULTRASOUND GUIDED PARACENTESIS TECHNIQUE: The procedure, risks (including but not limited to bleeding, infection, organ damage ), benefits, and  alternatives were explained to the patient. Questions regarding the procedure were encouraged and answered. The patient understands and consents to the procedure. Survey ultrasound of the abdomen was performed and an appropriate skin entry site in the right lower lateral abdomen was selected. Skin site was marked, prepped with chlorhexidine, and draped in usual sterile fashion, and infiltrated locally with 1% lidocaine. A Safe-T-Centesis sheath needle was advanced into the peritoneal space until fluid could be aspirated. The sheath was advanced and the needle removed. 3.5 L of clear yellowascites were aspirated. COMPLICATIONS: COMPLICATIONS none IMPRESSION: Technically successful ultrasound guided paracentesis, removing 3.5 L of ascites. Electronically Signed   By: Lucrezia Europe M.D.   On: 09/16/2016 13:55    ASSESSMENT: Peritoneal carcinomatosis, metastatic adenocarcinoma of likely lower GI primary.  PLAN:    1. Peritoneal carcinomatosis: Pathology results reviewed, immunohistochemistry suggested lower GI primary. CT results from July 11, 2016 essentially revealed stable disease. No malignant cells were seen on the ascitic fluid. Of note, patient had a normal colonoscopy  on October 16, 2014 in [place], [place]. Because of suspected progression of disease, patient's treatment was switched to FOLFIRI. Patient had consultation at [facility] recently who determined that no surgical or intraperitoneal chemotherapy would be beneficial. Proceed with cycle 5 of FOLFIRI today. We will not add Avastin since this seems to be no obvious benefit. Foundation 1 testing did not reveal and actionable mutations. Patient does not qualify for a clinical trial at [facility] secondary to her poor albumin.  Return to clinic in 2 days for pump removal and Neulasta and then in 2 weeks for consideration of cycle 6. Plan to reimage after cycle 6. 2: Genetic testing: Negative. 3. Anemia: Mild, monitor. 4.  Thrombocytopenia: Proceed with treatment as above. 5. Neutropenia: Neulasta with the remainder treatments as above. 6. Peripheral neuropathy: Resolved. Monitor while receiving oxaliplatin. 7. Ascites: Patient does not require an ultrasound-guided paracentesis today.  She will call if she feels she needs one prior to her next appointment.  Her last paracentesis was on September 16, 2016.  Patient expressed understanding and was in agreement with this plan. She also understands that She can call clinic at any time with any questions, concerns, or complaints.   Cancer Staging No matching staging information was found for the patient.   Lloyd Huger, MD 10/03/16 12:08 AM

## 2016-09-27 ENCOUNTER — Inpatient Hospital Stay (HOSPITAL_BASED_OUTPATIENT_CLINIC_OR_DEPARTMENT_OTHER): Payer: BLUE CROSS/BLUE SHIELD | Admitting: Oncology

## 2016-09-27 ENCOUNTER — Inpatient Hospital Stay: Payer: BLUE CROSS/BLUE SHIELD

## 2016-09-27 VITALS — BP 108/65 | HR 92 | Temp 97.8°F | Resp 18 | Wt 128.3 lb

## 2016-09-27 DIAGNOSIS — C786 Secondary malignant neoplasm of retroperitoneum and peritoneum: Secondary | ICD-10-CM

## 2016-09-27 DIAGNOSIS — K219 Gastro-esophageal reflux disease without esophagitis: Secondary | ICD-10-CM | POA: Diagnosis not present

## 2016-09-27 DIAGNOSIS — D701 Agranulocytosis secondary to cancer chemotherapy: Secondary | ICD-10-CM | POA: Diagnosis not present

## 2016-09-27 DIAGNOSIS — R188 Other ascites: Secondary | ICD-10-CM | POA: Diagnosis not present

## 2016-09-27 DIAGNOSIS — C801 Malignant (primary) neoplasm, unspecified: Secondary | ICD-10-CM | POA: Diagnosis not present

## 2016-09-27 DIAGNOSIS — D649 Anemia, unspecified: Secondary | ICD-10-CM | POA: Diagnosis not present

## 2016-09-27 DIAGNOSIS — E785 Hyperlipidemia, unspecified: Secondary | ICD-10-CM

## 2016-09-27 DIAGNOSIS — T451X5S Adverse effect of antineoplastic and immunosuppressive drugs, sequela: Secondary | ICD-10-CM

## 2016-09-27 DIAGNOSIS — Z79899 Other long term (current) drug therapy: Secondary | ICD-10-CM | POA: Diagnosis not present

## 2016-09-27 DIAGNOSIS — D696 Thrombocytopenia, unspecified: Secondary | ICD-10-CM

## 2016-09-27 DIAGNOSIS — Z87891 Personal history of nicotine dependence: Secondary | ICD-10-CM

## 2016-09-27 LAB — COMPREHENSIVE METABOLIC PANEL
ALT: 14 U/L (ref 14–54)
ANION GAP: 6 (ref 5–15)
AST: 25 U/L (ref 15–41)
Albumin: 2.7 g/dL — ABNORMAL LOW (ref 3.5–5.0)
Alkaline Phosphatase: 126 U/L (ref 38–126)
BUN: 10 mg/dL (ref 6–20)
CALCIUM: 8.3 mg/dL — AB (ref 8.9–10.3)
CHLORIDE: 104 mmol/L (ref 101–111)
CO2: 25 mmol/L (ref 22–32)
Creatinine, Ser: 0.66 mg/dL (ref 0.44–1.00)
GFR calc Af Amer: >60 mL/min (ref >60)
GFR calc non Af Amer: >60 mL/min (ref >60)
Glucose, Bld: 102 mg/dL — ABNORMAL HIGH (ref 65–99)
Potassium: 3.4 mmol/L — ABNORMAL LOW (ref 3.5–5.1)
Sodium: 135 mmol/L (ref 135–145)
TOTAL PROTEIN: 6.1 g/dL — AB (ref 6.5–8.1)
Total Bilirubin: 0.5 mg/dL (ref 0.3–1.2)

## 2016-09-27 LAB — CBC WITH DIFFERENTIAL/PLATELET
Basophils Absolute: 0 10*3/uL (ref 0–0.1)
Basophils Relative: 1 %
EOS ABS: 0.1 10*3/uL (ref 0–0.7)
EOS PCT: 1 %
HCT: 28.9 % — ABNORMAL LOW (ref 35.0–47.0)
HEMOGLOBIN: 9.8 g/dL — AB (ref 12.0–16.0)
LYMPHS ABS: 1.2 10*3/uL (ref 1.0–3.6)
Lymphocytes Relative: 13 %
MCH: 33.7 pg (ref 26.0–34.0)
MCHC: 33.9 g/dL (ref 32.0–36.0)
MCV: 99.6 fL (ref 80.0–100.0)
Monocytes Absolute: 0.8 10*3/uL (ref 0.2–0.9)
Monocytes Relative: 9 %
NEUTROS PCT: 76 %
Neutro Abs: 7 10*3/uL — ABNORMAL HIGH (ref 1.4–6.5)
PLATELETS: 101 10*3/uL — AB (ref 150–440)
RBC: 2.9 MIL/uL — AB (ref 3.80–5.20)
RDW: 17.9 % — ABNORMAL HIGH (ref 11.5–14.5)
WBC: 9.2 10*3/uL (ref 3.6–11.0)

## 2016-09-27 MED ORDER — DEXAMETHASONE SODIUM PHOSPHATE 10 MG/ML IJ SOLN
10.0000 mg | Freq: Once | INTRAMUSCULAR | Status: AC
  Administered 2016-09-27: 10 mg via INTRAVENOUS
  Filled 2016-09-27: qty 1

## 2016-09-27 MED ORDER — SODIUM CHLORIDE 0.9 % IV SOLN
2400.0000 mg/m2 | INTRAVENOUS | Status: DC
  Administered 2016-09-27: 4100 mg via INTRAVENOUS
  Filled 2016-09-27: qty 82

## 2016-09-27 MED ORDER — PALONOSETRON HCL INJECTION 0.25 MG/5ML
0.2500 mg | Freq: Once | INTRAVENOUS | Status: AC
  Administered 2016-09-27: 0.25 mg via INTRAVENOUS
  Filled 2016-09-27: qty 5

## 2016-09-27 MED ORDER — LEUCOVORIN CALCIUM INJECTION 350 MG
700.0000 mg | Freq: Once | INTRAVENOUS | Status: AC
  Administered 2016-09-27: 700 mg via INTRAVENOUS
  Filled 2016-09-27: qty 35

## 2016-09-27 MED ORDER — IRINOTECAN HCL CHEMO INJECTION 100 MG/5ML
180.0000 mg/m2 | Freq: Once | INTRAVENOUS | Status: AC
  Administered 2016-09-27: 300 mg via INTRAVENOUS
  Filled 2016-09-27: qty 15

## 2016-09-27 MED ORDER — ATROPINE SULFATE 1 MG/ML IJ SOLN
0.5000 mg | Freq: Once | INTRAMUSCULAR | Status: AC | PRN
  Administered 2016-09-27: 0.5 mg via INTRAVENOUS
  Filled 2016-09-27: qty 1

## 2016-09-27 MED ORDER — SODIUM CHLORIDE 0.9 % IV SOLN
Freq: Once | INTRAVENOUS | Status: AC
  Administered 2016-09-27: 11:00:00 via INTRAVENOUS
  Filled 2016-09-27: qty 1000

## 2016-09-27 MED ORDER — FLUOROURACIL CHEMO INJECTION 2.5 GM/50ML
400.0000 mg/m2 | Freq: Once | INTRAVENOUS | Status: AC
  Administered 2016-09-27: 700 mg via INTRAVENOUS
  Filled 2016-09-27: qty 14

## 2016-09-27 NOTE — Progress Notes (Signed)
Offers no complaints. States is feeling well. Requests prescription for cranial prosthesis.

## 2016-09-29 ENCOUNTER — Inpatient Hospital Stay: Payer: BLUE CROSS/BLUE SHIELD

## 2016-09-29 VITALS — BP 91/57 | HR 84 | Temp 97.4°F | Resp 18

## 2016-09-29 DIAGNOSIS — C786 Secondary malignant neoplasm of retroperitoneum and peritoneum: Secondary | ICD-10-CM | POA: Diagnosis not present

## 2016-09-29 DIAGNOSIS — C801 Malignant (primary) neoplasm, unspecified: Principal | ICD-10-CM

## 2016-09-29 MED ORDER — HEPARIN SOD (PORK) LOCK FLUSH 100 UNIT/ML IV SOLN
500.0000 [IU] | Freq: Once | INTRAVENOUS | Status: AC | PRN
  Administered 2016-09-29: 500 [IU]
  Filled 2016-09-29: qty 5

## 2016-09-29 MED ORDER — SODIUM CHLORIDE 0.9% FLUSH
10.0000 mL | INTRAVENOUS | Status: DC | PRN
  Administered 2016-09-29: 10 mL
  Filled 2016-09-29: qty 10

## 2016-09-29 MED ORDER — PEGFILGRASTIM INJECTION 6 MG/0.6ML ~~LOC~~
6.0000 mg | PREFILLED_SYRINGE | Freq: Once | SUBCUTANEOUS | Status: AC
  Administered 2016-09-29: 6 mg via SUBCUTANEOUS
  Filled 2016-09-29: qty 0.6

## 2016-10-03 ENCOUNTER — Other Ambulatory Visit: Payer: Self-pay | Admitting: Oncology

## 2016-10-03 DIAGNOSIS — C786 Secondary malignant neoplasm of retroperitoneum and peritoneum: Secondary | ICD-10-CM

## 2016-10-03 DIAGNOSIS — C801 Malignant (primary) neoplasm, unspecified: Principal | ICD-10-CM

## 2016-10-11 ENCOUNTER — Inpatient Hospital Stay: Payer: BLUE CROSS/BLUE SHIELD

## 2016-10-11 ENCOUNTER — Ambulatory Visit: Payer: BLUE CROSS/BLUE SHIELD | Admitting: Oncology

## 2016-10-11 ENCOUNTER — Inpatient Hospital Stay: Payer: BLUE CROSS/BLUE SHIELD | Attending: Oncology

## 2016-10-11 ENCOUNTER — Other Ambulatory Visit: Payer: Self-pay | Admitting: Oncology

## 2016-10-11 VITALS — BP 102/65 | HR 83 | Temp 96.3°F | Resp 18

## 2016-10-11 DIAGNOSIS — Z79899 Other long term (current) drug therapy: Secondary | ICD-10-CM | POA: Diagnosis not present

## 2016-10-11 DIAGNOSIS — K219 Gastro-esophageal reflux disease without esophagitis: Secondary | ICD-10-CM | POA: Insufficient documentation

## 2016-10-11 DIAGNOSIS — C801 Malignant (primary) neoplasm, unspecified: Secondary | ICD-10-CM | POA: Insufficient documentation

## 2016-10-11 DIAGNOSIS — E785 Hyperlipidemia, unspecified: Secondary | ICD-10-CM | POA: Insufficient documentation

## 2016-10-11 DIAGNOSIS — M1712 Unilateral primary osteoarthritis, left knee: Secondary | ICD-10-CM | POA: Insufficient documentation

## 2016-10-11 DIAGNOSIS — C786 Secondary malignant neoplasm of retroperitoneum and peritoneum: Secondary | ICD-10-CM | POA: Insufficient documentation

## 2016-10-11 DIAGNOSIS — F419 Anxiety disorder, unspecified: Secondary | ICD-10-CM | POA: Diagnosis not present

## 2016-10-11 DIAGNOSIS — Z5111 Encounter for antineoplastic chemotherapy: Secondary | ICD-10-CM | POA: Insufficient documentation

## 2016-10-11 DIAGNOSIS — R188 Other ascites: Secondary | ICD-10-CM | POA: Insufficient documentation

## 2016-10-11 DIAGNOSIS — D696 Thrombocytopenia, unspecified: Secondary | ICD-10-CM | POA: Diagnosis not present

## 2016-10-11 DIAGNOSIS — Z87891 Personal history of nicotine dependence: Secondary | ICD-10-CM | POA: Insufficient documentation

## 2016-10-11 DIAGNOSIS — D701 Agranulocytosis secondary to cancer chemotherapy: Secondary | ICD-10-CM | POA: Diagnosis not present

## 2016-10-11 DIAGNOSIS — T451X5S Adverse effect of antineoplastic and immunosuppressive drugs, sequela: Secondary | ICD-10-CM | POA: Diagnosis not present

## 2016-10-11 DIAGNOSIS — D649 Anemia, unspecified: Secondary | ICD-10-CM | POA: Insufficient documentation

## 2016-10-11 LAB — CBC WITH DIFFERENTIAL/PLATELET
BASOS ABS: 0 10*3/uL (ref 0–0.1)
BASOS PCT: 1 %
EOS ABS: 0.2 10*3/uL (ref 0–0.7)
Eosinophils Relative: 2 %
HEMATOCRIT: 29.7 % — AB (ref 35.0–47.0)
HEMOGLOBIN: 10.1 g/dL — AB (ref 12.0–16.0)
Lymphocytes Relative: 16 %
Lymphs Abs: 1.3 10*3/uL (ref 1.0–3.6)
MCH: 33.7 pg (ref 26.0–34.0)
MCHC: 33.9 g/dL (ref 32.0–36.0)
MCV: 99.3 fL (ref 80.0–100.0)
Monocytes Absolute: 0.9 10*3/uL (ref 0.2–0.9)
Monocytes Relative: 10 %
NEUTROS ABS: 5.9 10*3/uL (ref 1.4–6.5)
NEUTROS PCT: 71 %
Platelets: 135 10*3/uL — ABNORMAL LOW (ref 150–440)
RBC: 2.99 MIL/uL — ABNORMAL LOW (ref 3.80–5.20)
RDW: 17.5 % — AB (ref 11.5–14.5)
WBC: 8.2 10*3/uL (ref 3.6–11.0)

## 2016-10-11 LAB — COMPREHENSIVE METABOLIC PANEL
ALK PHOS: 144 U/L — AB (ref 38–126)
ALT: 15 U/L (ref 14–54)
ANION GAP: 5 (ref 5–15)
AST: 25 U/L (ref 15–41)
Albumin: 2.9 g/dL — ABNORMAL LOW (ref 3.5–5.0)
BILIRUBIN TOTAL: 0.4 mg/dL (ref 0.3–1.2)
BUN: 10 mg/dL (ref 6–20)
CO2: 25 mmol/L (ref 22–32)
Calcium: 8.4 mg/dL — ABNORMAL LOW (ref 8.9–10.3)
Chloride: 106 mmol/L (ref 101–111)
Creatinine, Ser: 0.61 mg/dL (ref 0.44–1.00)
GFR calc Af Amer: >60 mL/min (ref >60)
GFR calc non Af Amer: >60 mL/min (ref >60)
GLUCOSE: 101 mg/dL — AB (ref 65–99)
POTASSIUM: 3.6 mmol/L (ref 3.5–5.1)
SODIUM: 136 mmol/L (ref 135–145)
TOTAL PROTEIN: 6.3 g/dL — AB (ref 6.5–8.1)

## 2016-10-11 MED ORDER — FLUOROURACIL CHEMO INJECTION 2.5 GM/50ML
400.0000 mg/m2 | Freq: Once | INTRAVENOUS | Status: AC
Start: 2016-10-11 — End: 2016-10-11
  Administered 2016-10-11: 700 mg via INTRAVENOUS
  Filled 2016-10-11: qty 14

## 2016-10-11 MED ORDER — LEUCOVORIN CALCIUM INJECTION 350 MG
700.0000 mg | Freq: Once | INTRAMUSCULAR | Status: AC
  Administered 2016-10-11: 700 mg via INTRAVENOUS
  Filled 2016-10-11: qty 35

## 2016-10-11 MED ORDER — DEXAMETHASONE SODIUM PHOSPHATE 10 MG/ML IJ SOLN
10.0000 mg | Freq: Once | INTRAMUSCULAR | Status: AC
  Administered 2016-10-11: 10 mg via INTRAVENOUS
  Filled 2016-10-11: qty 1

## 2016-10-11 MED ORDER — IRINOTECAN HCL CHEMO INJECTION 100 MG/5ML
180.0000 mg/m2 | Freq: Once | INTRAVENOUS | Status: AC
  Administered 2016-10-11: 300 mg via INTRAVENOUS
  Filled 2016-10-11: qty 15

## 2016-10-11 MED ORDER — HEPARIN SOD (PORK) LOCK FLUSH 100 UNIT/ML IV SOLN: 500.0000 [IU] | Freq: Once | INTRAVENOUS | Status: DC

## 2016-10-11 MED ORDER — SODIUM CHLORIDE 0.9 % IV SOLN
Freq: Once | INTRAVENOUS | Status: AC
  Administered 2016-10-11: 10:00:00 via INTRAVENOUS
  Filled 2016-10-11: qty 1000

## 2016-10-11 MED ORDER — ATROPINE SULFATE 1 MG/ML IJ SOLN
0.5000 mg | Freq: Once | INTRAMUSCULAR | Status: AC | PRN
  Administered 2016-10-11: 0.5 mg via INTRAVENOUS
  Filled 2016-10-11: qty 1

## 2016-10-11 MED ORDER — SODIUM CHLORIDE 0.9% FLUSH
10.0000 mL | Freq: Once | INTRAVENOUS | Status: AC
  Administered 2016-10-11: 10 mL via INTRAVENOUS
  Filled 2016-10-11: qty 10

## 2016-10-11 MED ORDER — SODIUM CHLORIDE 0.9 % IV SOLN
2400.0000 mg/m2 | INTRAVENOUS | Status: DC
  Administered 2016-10-11: 4100 mg via INTRAVENOUS
  Filled 2016-10-11: qty 50

## 2016-10-11 MED ORDER — PALONOSETRON HCL INJECTION 0.25 MG/5ML
0.2500 mg | Freq: Once | INTRAVENOUS | Status: AC
  Administered 2016-10-11: 0.25 mg via INTRAVENOUS
  Filled 2016-10-11: qty 5

## 2016-10-11 MED ORDER — DEXAMETHASONE SODIUM PHOSPHATE 100 MG/10ML IJ SOLN: 10.0000 mg | Freq: Once | INTRAMUSCULAR | Status: DC

## 2016-10-13 ENCOUNTER — Inpatient Hospital Stay: Payer: BLUE CROSS/BLUE SHIELD

## 2016-10-13 ENCOUNTER — Encounter (HOSPITAL_COMMUNITY): Payer: Self-pay

## 2016-10-13 VITALS — BP 102/65 | HR 81 | Temp 97.0°F | Resp 18

## 2016-10-13 DIAGNOSIS — C801 Malignant (primary) neoplasm, unspecified: Principal | ICD-10-CM

## 2016-10-13 DIAGNOSIS — C786 Secondary malignant neoplasm of retroperitoneum and peritoneum: Secondary | ICD-10-CM

## 2016-10-13 MED ORDER — PEGFILGRASTIM INJECTION 6 MG/0.6ML ~~LOC~~
6.0000 mg | PREFILLED_SYRINGE | Freq: Once | SUBCUTANEOUS | Status: AC
  Administered 2016-10-13: 6 mg via SUBCUTANEOUS
  Filled 2016-10-13: qty 0.6

## 2016-10-13 MED ORDER — HEPARIN SOD (PORK) LOCK FLUSH 100 UNIT/ML IV SOLN
500.0000 [IU] | Freq: Once | INTRAVENOUS | Status: AC | PRN
  Administered 2016-10-13: 500 [IU]
  Filled 2016-10-13: qty 5

## 2016-10-13 MED ORDER — SODIUM CHLORIDE 0.9% FLUSH
10.0000 mL | INTRAVENOUS | Status: DC | PRN
  Administered 2016-10-13: 10 mL
  Filled 2016-10-13: qty 10

## 2016-10-14 ENCOUNTER — Other Ambulatory Visit: Payer: Self-pay | Admitting: Oncology

## 2016-10-14 ENCOUNTER — Ambulatory Visit
Admission: RE | Admit: 2016-10-14 | Discharge: 2016-10-14 | Disposition: A | Discharge status: Home / Self Care | Payer: BLUE CROSS/BLUE SHIELD | Source: Ambulatory Visit | Attending: Oncology | Admitting: Oncology

## 2016-10-14 DIAGNOSIS — C786 Secondary malignant neoplasm of retroperitoneum and peritoneum: Secondary | ICD-10-CM | POA: Insufficient documentation

## 2016-10-14 DIAGNOSIS — C801 Malignant (primary) neoplasm, unspecified: Secondary | ICD-10-CM | POA: Diagnosis present

## 2016-10-14 DIAGNOSIS — R188 Other ascites: Secondary | ICD-10-CM | POA: Diagnosis not present

## 2016-10-14 NOTE — Discharge Instructions (Signed)
Paracentesis, Care After  Refer to this sheet in the next few weeks. These instructions provide you with information about caring for yourself after your procedure. Your health care provider may also give you more specific instructions. Your treatment has been planned according to current medical practices, but problems sometimes occur. Call your health care provider if you have any problems or questions after your procedure.  What can I expect after the procedure?  After your procedure, it is common to have a small amount of clear fluid coming from the puncture site.  Follow these instructions at home:  · Return to your normal activities as told by your health care provider. Ask your health care provider what activities are safe for you.  · Take over-the-counter and prescription medicines only as told by your health care provider.  · Do not take baths, swim, or use a hot tub until your health care provider approves.  · Follow instructions from your health care provider about:  ? How to take care of your puncture site.  ? When and how you should change your bandage (dressing).  ? When you should remove your dressing.  · Check your puncture area every day signs of infection. Watch for:  ? Redness, swelling, or pain.  ? Fluid, blood, or pus.  · Keep all follow-up visits as told by your health care provider. This is important.  Contact a health care provider if:  · You have redness, swelling, or pain at your puncture site.  · You start to have more clear fluid coming from your puncture site.  · You have blood or pus coming from your puncture site.  · You have chills.  · You have a fever.  Get help right away if:  · You develop chest pain or shortness of breath.  · You develop increasing pain, discomfort, or swelling in your abdomen.  · You feel dizzy or light-headed or you pass out.  This information is not intended to replace advice given to you by your health care provider. Make sure you discuss any questions you  have with your health care provider.  Document Released: 11/04/2014 Document Revised: 11/26/2015 Document Reviewed: 09/02/2014  Elsevier Interactive Patient Education © 2017 [employer] Inc[employer]

## 2016-10-20 ENCOUNTER — Ambulatory Visit
Admission: RE | Admit: 2016-10-20 | Discharge: 2016-10-20 | Disposition: A | Discharge status: Home / Self Care | Payer: BLUE CROSS/BLUE SHIELD | Source: Ambulatory Visit | Attending: Oncology | Admitting: Oncology

## 2016-10-20 DIAGNOSIS — R911 Solitary pulmonary nodule: Secondary | ICD-10-CM | POA: Insufficient documentation

## 2016-10-20 DIAGNOSIS — J9811 Atelectasis: Secondary | ICD-10-CM | POA: Insufficient documentation

## 2016-10-20 DIAGNOSIS — C786 Secondary malignant neoplasm of retroperitoneum and peritoneum: Secondary | ICD-10-CM | POA: Diagnosis not present

## 2016-10-20 DIAGNOSIS — C801 Malignant (primary) neoplasm, unspecified: Secondary | ICD-10-CM | POA: Diagnosis present

## 2016-10-20 DIAGNOSIS — J90 Pleural effusion, not elsewhere classified: Secondary | ICD-10-CM | POA: Insufficient documentation

## 2016-10-20 DIAGNOSIS — R188 Other ascites: Secondary | ICD-10-CM | POA: Diagnosis not present

## 2016-10-20 LAB — GLUCOSE, CAPILLARY: GLUCOSE-CAPILLARY: 96 mg/dL (ref 65–99)

## 2016-10-20 MED ORDER — FLUDEOXYGLUCOSE F - 18 (FDG) INJECTION
13.0600 | Freq: Once | INTRAVENOUS | Status: AC | PRN
  Administered 2016-10-20: 13.06 via INTRAVENOUS

## 2016-10-24 NOTE — Progress Notes (Signed)
[facility]  Telephone:(336) [phone] [phone]) [phone]  ID: RICHA SHOR OB: 09/28/1960  MR#: [id number]  UDJ#:[id number]  Patient Care Team: Valerie Roys, DO as PCP - General (Family Medicine) Clent Jacks, RN as Registered Nurse Garnetta Buddy, MD as Consulting Physician (Internal Medicine)  CHIEF COMPLAINT: Peritoneal carcinomatosis, metastatic adenocarcinoma of likely lower GI primary.  INTERVAL HISTORY: Patient returns to clinic today for further evaluation and consideration of cycle 7 of FOLFIRI and discussion of her imaging results. She continues to require a paracentesis approximately every 2-4 weeks. She denies any abdominal pain. She has no neurological complaints. She denies any recent fevers or illnesses.  She has no chest pain or shortness of breath.  She denies and nausea, vomiting, constipation, or diarrhea. She has no melena or hematochezia. She has no urinary complains.  Patient offers no further specific complaints.  REVIEW OF SYSTEMS:   Review of Systems  Constitutional: Negative for fever, malaise/fatigue and weight loss.  Respiratory: Negative.  Negative for cough and shortness of breath.   Cardiovascular: Negative.  Negative for chest pain.  Gastrointestinal: Negative for abdominal pain, blood in stool, constipation, diarrhea, melena and vomiting.       Positive for abdominal bloating  Genitourinary: Negative.   Musculoskeletal: Negative.   Neurological: Positive for sensory change. Negative for weakness.  Psychiatric/Behavioral: The patient is not nervous/anxious.     As per HPI. Otherwise, a complete review of systems is negative.  PAST MEDICAL HISTORY: Past Medical History:  Diagnosis Date  . Acid reflux   . Allergic rhinitis   . Anxiety   . Arthritis    left knee  . Cancer ([place])   . Cervical spondylosis   . CTS (carpal tunnel syndrome)    Right  . Diverticulosis   . HSV-2 (herpes simplex virus 2) infection   .  Hyperlipidemia   . Ovarian failure   . Rosacea   . Wears contact lenses     PAST SURGICAL HISTORY: Past Surgical History:  Procedure Laterality Date  . ANTERIOR CRUCIATE LIGAMENT REPAIR Left   . CESAREAN SECTION    . COLONOSCOPY WITH PROPOFOL N/A 01/29/2016   Procedure: COLONOSCOPY WITH PROPOFOL;  Surgeon: Lucilla Lame, MD;  Location: [facility];  Service: Endoscopy;  Laterality: N/A;  . ESOPHAGOGASTRODUODENOSCOPY (EGD) WITH PROPOFOL N/A 01/29/2016   Procedure: ESOPHAGOGASTRODUODENOSCOPY (EGD) WITH PROPOFOL;  Surgeon: Lucilla Lame, MD;  Location: [facility];  Service: Endoscopy;  Laterality: N/A;  . PERIPHERAL VASCULAR CATHETERIZATION N/A 02/10/2016   Procedure: Glori Luis Cath Insertion;  Surgeon: Algernon Huxley, MD;  Location: [facility];  Service: Cardiovascular;  Laterality: N/A;    FAMILY HISTORY: Unknown.  Patient is adopted.     ADVANCED DIRECTIVES:    HEALTH MAINTENANCE: Social History  Substance Use Topics  . Smoking status: Former Research scientist (life sciences)  . Smokeless tobacco: Never Used     Comment: Quit in her 85's  . Alcohol use 0.0 oz/week     Comment: 1 drink/mo     Colonoscopy:  PAP:  Bone density:  Lipid panel:  No Known Allergies  Current Outpatient Prescriptions  Medication Sig Dispense Refill  . acyclovir (ZOVIRAX) 800 MG tablet Take 1 tablet (800 mg total) by mouth daily. 90 tablet 1  . escitalopram (LEXAPRO) 10 MG tablet TAKE 1 TABLET (10 MG TOTAL) BY MOUTH DAILY. 90 tablet 1  . lidocaine-prilocaine (EMLA) cream Apply 1 application topically as needed. 30 g 3  . ondansetron (ZOFRAN) 8 MG  tablet Take by mouth every 8 (eight) hours as needed for nausea or vomiting.    . pantoprazole (PROTONIX) 40 MG tablet Take 1 tablet (40 mg total) by mouth daily. 30 tablet 2  . prochlorperazine (COMPAZINE) 10 MG tablet Take 10 mg by mouth every 6 (six) hours as needed for nausea or vomiting.     No current facility-administered medications for this visit.     Facility-Administered Medications Ordered in Other Visits  Medication Dose Route Frequency Provider Last Rate Last Dose  . heparin lock flush 100 unit/mL  500 Units Intravenous Once Lloyd Huger, MD      . sodium chloride flush (NS) 0.9 % injection 10 mL  10 mL Intravenous PRN Lloyd Huger, MD   10 mL at 08/16/16 0925    OBJECTIVE: Vitals:   10/25/16 0951  BP: 107/68  Pulse: 86  Temp: 97.8 F (36.6 C)     Body mass index is 22.51 kg/m.    ECOG FS:0 - Asymptomatic  General: Well-developed, well-nourished, no acute distress. Eyes: Pink conjunctiva, anicteric sclera. Lungs: Clear to auscultation bilaterally. Heart: Regular rate and rhythm. No rubs, murmurs, or gallops. Abdomen: Distended, nontender.  Musculoskeletal: No edema, cyanosis, or clubbing. Neuro: Alert, answering all questions appropriately. Cranial nerves grossly intact. Skin: No rashes or petechiae noted. Psych: Normal affect.   LAB RESULTS:  Lab Results  Component Value Date   NA 137 10/25/2016   K 3.9 10/25/2016   CL 106 10/25/2016   CO2 24 10/25/2016   GLUCOSE 101 (H) 10/25/2016   BUN 10 10/25/2016   CREATININE 0.56 10/25/2016   CALCIUM 8.5 (L) 10/25/2016   PROT 6.6 10/25/2016   ALBUMIN 3.2 (L) 10/25/2016   AST 26 10/25/2016   ALT 16 10/25/2016   ALKPHOS 157 (H) 10/25/2016   BILITOT 0.5 10/25/2016   GFRNONAA >60 10/25/2016   GFRAA >60 10/25/2016    Lab Results  Component Value Date   WBC 8.3 10/25/2016   NEUTROABS 5.6 10/25/2016   HGB 10.5 (L) 10/25/2016   HCT 30.7 (L) 10/25/2016   MCV 97.8 10/25/2016   PLT 131 (L) 10/25/2016   Lab Results  Component Value Date   CA125 86.6 (H) 03/29/2016     STUDIES: Nm Pet Image Restag (ps) Skull Base To Thigh  Result Date: 10/20/2016 CLINICAL DATA:  Subsequent treatment strategy for peritoneal carcinomatosis. EXAM: NUCLEAR MEDICINE PET SKULL BASE TO THIGH TECHNIQUE: 13.1 mCi F-18 FDG was injected intravenously. Full-ring PET imaging was  performed from the skull base to thigh after the radiotracer. CT data was obtained and used for attenuation correction and anatomic localization. FASTING BLOOD GLUCOSE:  Value: 96 mg/dl COMPARISON:  Multiple exams, including 07/11/2016 CT scan FINDINGS: NECK No hypermetabolic lymph nodes in the neck. CHEST Right IJ Port-A-Cath tip:  Cavoatrial junction. Trace left pleural effusion without a significant degree of hypermetabolic activity. No hypermetabolic thoracic adenopathy Mild atelectasis along both hemidiaphragms. Subtle nodularity at the right lung apex is likely primarily from pleuroparenchymal scarring and some of this was present on 01/05/2016, although there is a 3 by 5 mm apical nodule on image 76/3 which may be sub solid but is not definitely seen on the prior exam. No associated hypermetabolic activity. ABDOMEN/PELVIS Large volume ascites although less than 07/11/2016, all with extensive infiltration of the omentum. This omental infiltration demonstrates low-grade activity, with a representative region having a maximum standard uptake value of 2.0 in the left abdomen. The ascites also demonstrates low-grade activity. For comparison purposes,  background liver activity is 2.6 and background mediastinal blood pool activity is 1.5. Stable calcification along the dome of the right hepatic lobe on image 119/3. Spleen measures 12.3 by 5.6 by 12.1 cm (volume = 440 cm^3), borderline for splenomegaly. SKELETON There is diffuse accentuated metabolic activity in the bony structures, not appreciably focally abnormal but rather diffusely accentuated. IMPRESSION: 1. Reduced ascites compared to 07/11/16, although there is still a large volume of ascites and extensive infiltration of the omentum compatible with tumor. The omental infiltration and the ascites both demonstrate low-grade metabolic activity about midway between the activity of the blood pool and the activity in the liver. 2. There is also a new small left  pleural effusion, nonspecific for transudative or exudative etiology. 3. Diffuse marrow activity probably reflects therapy related marrow stimulation. 4. Mild atelectasis along both hemidiaphragms. 5. 3 by 5 mm sub solid right apical nodule was not seen on prior chest CT of 01/25/2016 and may merit surveillance. Electronically Signed   By: Van Clines M.D.   On: 10/20/2016 14:09   US Abdomen Limited  Addendum Date: 10/26/2016   ADDENDUM REPORT: 10/26/2016 16:22 ADDENDUM: Agree with final report below. Electronically Signed   By: Jerilynn Mages.  Shick M.D.   On: 10/26/2016 16:22   Result Date: 10/26/2016 CLINICAL DATA:  Recurrent ascites EXAM: LIMITED ABDOMEN ULTRASOUND FOR ASCITES TECHNIQUE: Limited ultrasound survey for ascites was performed in all four abdominal quadrants. COMPARISON:  Previous paracentesis FINDINGS: Limited US was performed viewing all 4 quadrants. Very small amount of ascites noted in RUQ. Too small to merit paracentesis and without safe window for access. No paracentesis was performed. IMPRESSION: Only minimal ascites in RUQ appreciated.  No safe window for access. No paracentesis performed Read by: Lavonia Drafts [facility] Electronically Signed: By: Jerilynn Mages.  Shick M.D. On: 10/26/2016 15:15   US Abdomen Limited  Result Date: 10/14/2016 CLINICAL DATA:  Peritoneal carcinomatosis, receiving chemotherapy, abdominal distension EXAM: LIMITED ABDOMEN ULTRASOUND FOR ASCITES TECHNIQUE: Limited ultrasound survey for ascites was performed in all four abdominal quadrants. COMPARISON:  09/16/2016 FINDINGS: Survey of the abdominal quadrants demonstrates a small amount of ascites in the right upper quadrant about the inferior liver margin. Otherwise no significant abdominal ascites in the lower abdomen or left abdomen. Therefore, therapeutic paracentesis not performed today. IMPRESSION: Small amount of abdominal ascites, much less than last month. Therefore therapeutic paracentesis not performed. Findings  discussed with the patient, who is in agreement. Electronically Signed   By: Jerilynn Mages.  Shick M.D.   On: 10/14/2016 15:45    ASSESSMENT: Peritoneal carcinomatosis, metastatic adenocarcinoma of likely lower GI primary.  PLAN:    1. Peritoneal carcinomatosis: Pathology results reviewed, immunohistochemistry suggested lower GI primary. PET scan results reviewed independently and reported as above with what appears to be stable disease. Clinically she is also improving since she requires paracentesis less frequently.  Of note, patient had a normal colonoscopy on October 16, 2014 in [place], [place]. Because of suspected progression of disease, patient's treatment was switched to FOLFIRI. Patient had consultation at [facility] recently who determined that no surgical or intraperitoneal chemotherapy would be beneficial. Proceed with cycle 7 of FOLFIRI today. Will not add Avastin since this seems to be no obvious benefit. Foundation 1 testing did not reveal and actionable mutations. Patient does not qualify for a clinical trial at [facility] secondary to her poor albumin.  Return to clinic in 2 days for pump removal and Neulasta and then in 2 weeks for consideration of cycle  8. Plan to reimage after cycle 12. 2: Genetic testing: Negative. 3. Anemia: Mild, monitor. 4. Thrombocytopenia: Proceed with treatment as above. 5. Neutropenia: Neulasta with the remainder treatments as above. 6. Peripheral neuropathy: Resolved. Monitor while receiving oxaliplatin. 7. Ascites: Patient will have a repeat paracentesis in 2 days when she returns for pump removal.  Patient expressed understanding and was in agreement with this plan. She also understands that She can call clinic at any time with any questions, concerns, or complaints.   Cancer Staging No matching staging information was found for the patient.   Lloyd Huger, MD 10/28/16 1:45 PM

## 2016-10-25 ENCOUNTER — Inpatient Hospital Stay: Payer: BLUE CROSS/BLUE SHIELD

## 2016-10-25 ENCOUNTER — Inpatient Hospital Stay (HOSPITAL_BASED_OUTPATIENT_CLINIC_OR_DEPARTMENT_OTHER): Payer: BLUE CROSS/BLUE SHIELD | Admitting: Oncology

## 2016-10-25 ENCOUNTER — Encounter: Payer: Self-pay | Admitting: Oncology

## 2016-10-25 VITALS — BP 107/68 | HR 86 | Temp 97.8°F | Wt 128.7 lb

## 2016-10-25 VITALS — Resp 18

## 2016-10-25 DIAGNOSIS — F419 Anxiety disorder, unspecified: Secondary | ICD-10-CM

## 2016-10-25 DIAGNOSIS — C801 Malignant (primary) neoplasm, unspecified: Secondary | ICD-10-CM

## 2016-10-25 DIAGNOSIS — D649 Anemia, unspecified: Secondary | ICD-10-CM

## 2016-10-25 DIAGNOSIS — D696 Thrombocytopenia, unspecified: Secondary | ICD-10-CM | POA: Diagnosis not present

## 2016-10-25 DIAGNOSIS — T451X5S Adverse effect of antineoplastic and immunosuppressive drugs, sequela: Secondary | ICD-10-CM | POA: Diagnosis not present

## 2016-10-25 DIAGNOSIS — J309 Allergic rhinitis, unspecified: Secondary | ICD-10-CM | POA: Insufficient documentation

## 2016-10-25 DIAGNOSIS — M1712 Unilateral primary osteoarthritis, left knee: Secondary | ICD-10-CM

## 2016-10-25 DIAGNOSIS — E785 Hyperlipidemia, unspecified: Secondary | ICD-10-CM

## 2016-10-25 DIAGNOSIS — C786 Secondary malignant neoplasm of retroperitoneum and peritoneum: Secondary | ICD-10-CM

## 2016-10-25 DIAGNOSIS — Z79899 Other long term (current) drug therapy: Secondary | ICD-10-CM

## 2016-10-25 DIAGNOSIS — R188 Other ascites: Secondary | ICD-10-CM

## 2016-10-25 DIAGNOSIS — Z87891 Personal history of nicotine dependence: Secondary | ICD-10-CM

## 2016-10-25 DIAGNOSIS — R682 Dry mouth, unspecified: Secondary | ICD-10-CM

## 2016-10-25 DIAGNOSIS — M47812 Spondylosis without myelopathy or radiculopathy, cervical region: Secondary | ICD-10-CM | POA: Insufficient documentation

## 2016-10-25 DIAGNOSIS — D701 Agranulocytosis secondary to cancer chemotherapy: Secondary | ICD-10-CM | POA: Diagnosis not present

## 2016-10-25 DIAGNOSIS — K219 Gastro-esophageal reflux disease without esophagitis: Secondary | ICD-10-CM

## 2016-10-25 DIAGNOSIS — D126 Benign neoplasm of colon, unspecified: Secondary | ICD-10-CM | POA: Insufficient documentation

## 2016-10-25 DIAGNOSIS — K117 Disturbances of salivary secretion: Secondary | ICD-10-CM | POA: Insufficient documentation

## 2016-10-25 LAB — CBC WITH DIFFERENTIAL/PLATELET
BASOS ABS: 0.1 10*3/uL (ref 0–0.1)
BASOS PCT: 1 %
EOS ABS: 0.1 10*3/uL (ref 0–0.7)
Eosinophils Relative: 1 %
HEMATOCRIT: 30.7 % — AB (ref 35.0–47.0)
HEMOGLOBIN: 10.5 g/dL — AB (ref 12.0–16.0)
Lymphocytes Relative: 21 %
Lymphs Abs: 1.7 10*3/uL (ref 1.0–3.6)
MCH: 33.3 pg (ref 26.0–34.0)
MCHC: 34.1 g/dL (ref 32.0–36.0)
MCV: 97.8 fL (ref 80.0–100.0)
Monocytes Absolute: 0.8 10*3/uL (ref 0.2–0.9)
Monocytes Relative: 10 %
NEUTROS ABS: 5.6 10*3/uL (ref 1.4–6.5)
NEUTROS PCT: 67 %
Platelets: 131 10*3/uL — ABNORMAL LOW (ref 150–440)
RBC: 3.15 MIL/uL — AB (ref 3.80–5.20)
RDW: 17.2 % — ABNORMAL HIGH (ref 11.5–14.5)
WBC: 8.3 10*3/uL (ref 3.6–11.0)

## 2016-10-25 LAB — COMPREHENSIVE METABOLIC PANEL
ALBUMIN: 3.2 g/dL — AB (ref 3.5–5.0)
ALK PHOS: 157 U/L — AB (ref 38–126)
ALT: 16 U/L (ref 14–54)
ANION GAP: 7 (ref 5–15)
AST: 26 U/L (ref 15–41)
BILIRUBIN TOTAL: 0.5 mg/dL (ref 0.3–1.2)
BUN: 10 mg/dL (ref 6–20)
CALCIUM: 8.5 mg/dL — AB (ref 8.9–10.3)
CO2: 24 mmol/L (ref 22–32)
Chloride: 106 mmol/L (ref 101–111)
Creatinine, Ser: 0.56 mg/dL (ref 0.44–1.00)
GFR calc Af Amer: >60 mL/min (ref >60)
GFR calc non Af Amer: >60 mL/min (ref >60)
GLUCOSE: 101 mg/dL — AB (ref 65–99)
Potassium: 3.9 mmol/L (ref 3.5–5.1)
SODIUM: 137 mmol/L (ref 135–145)
Total Protein: 6.6 g/dL (ref 6.5–8.1)

## 2016-10-25 MED ORDER — HEPARIN SOD (PORK) LOCK FLUSH 100 UNIT/ML IV SOLN: 500.0000 [IU] | Freq: Once | INTRAVENOUS | Status: DC

## 2016-10-25 MED ORDER — LEUCOVORIN CALCIUM INJECTION 350 MG
700.0000 mg | Freq: Once | INTRAVENOUS | Status: AC
  Administered 2016-10-25: 700 mg via INTRAVENOUS
  Filled 2016-10-25: qty 25

## 2016-10-25 MED ORDER — SODIUM CHLORIDE 0.9 % IV SOLN
Freq: Once | INTRAVENOUS | Status: AC
  Administered 2016-10-25: 10:00:00 via INTRAVENOUS
  Filled 2016-10-25: qty 1000

## 2016-10-25 MED ORDER — SODIUM CHLORIDE 0.9 % IV SOLN
2400.0000 mg/m2 | INTRAVENOUS | Status: DC
  Administered 2016-10-25: 4100 mg via INTRAVENOUS
  Filled 2016-10-25: qty 82

## 2016-10-25 MED ORDER — FLUOROURACIL CHEMO INJECTION 2.5 GM/50ML
400.0000 mg/m2 | Freq: Once | INTRAVENOUS | Status: AC
  Administered 2016-10-25: 700 mg via INTRAVENOUS
  Filled 2016-10-25: qty 14

## 2016-10-25 MED ORDER — ATROPINE SULFATE 1 MG/ML IJ SOLN
0.5000 mg | Freq: Once | INTRAMUSCULAR | Status: AC | PRN
  Administered 2016-10-25: 0.5 mg via INTRAVENOUS
  Filled 2016-10-25: qty 1

## 2016-10-25 MED ORDER — DEXAMETHASONE SODIUM PHOSPHATE 10 MG/ML IJ SOLN
10.0000 mg | Freq: Once | INTRAMUSCULAR | Status: AC
  Administered 2016-10-25: 10 mg via INTRAVENOUS
  Filled 2016-10-25: qty 1

## 2016-10-25 MED ORDER — PALONOSETRON HCL INJECTION 0.25 MG/5ML
0.2500 mg | Freq: Once | INTRAVENOUS | Status: AC
  Administered 2016-10-25: 0.25 mg via INTRAVENOUS
  Filled 2016-10-25: qty 5

## 2016-10-25 MED ORDER — IRINOTECAN HCL CHEMO INJECTION 100 MG/5ML
180.0000 mg/m2 | Freq: Once | INTRAVENOUS | Status: AC
  Administered 2016-10-25: 300 mg via INTRAVENOUS
  Filled 2016-10-25: qty 15

## 2016-10-25 MED ORDER — SODIUM CHLORIDE 0.9% FLUSH
10.0000 mL | INTRAVENOUS | Status: DC | PRN
  Administered 2016-10-25: 10 mL via INTRAVENOUS
  Filled 2016-10-25: qty 10

## 2016-10-25 NOTE — Discharge Instructions (Signed)
Paracentesis, Care After  Refer to this sheet in the next few weeks. These instructions provide you with information about caring for yourself after your procedure. Your health care provider may also give you more specific instructions. Your treatment has been planned according to current medical practices, but problems sometimes occur. Call your health care provider if you have any problems or questions after your procedure.  What can I expect after the procedure?  After your procedure, it is common to have a small amount of clear fluid coming from the puncture site.  Follow these instructions at home:  · Return to your normal activities as told by your health care provider. Ask your health care provider what activities are safe for you.  · Take over-the-counter and prescription medicines only as told by your health care provider.  · Do not take baths, swim, or use a hot tub until your health care provider approves.  · Follow instructions from your health care provider about:  ? How to take care of your puncture site.  ? When and how you should change your bandage (dressing).  ? When you should remove your dressing.  · Check your puncture area every day signs of infection. Watch for:  ? Redness, swelling, or pain.  ? Fluid, blood, or pus.  · Keep all follow-up visits as told by your health care provider. This is important.  Contact a health care provider if:  · You have redness, swelling, or pain at your puncture site.  · You start to have more clear fluid coming from your puncture site.  · You have blood or pus coming from your puncture site.  · You have chills.  · You have a fever.  Get help right away if:  · You develop chest pain or shortness of breath.  · You develop increasing pain, discomfort, or swelling in your abdomen.  · You feel dizzy or light-headed or you pass out.  This information is not intended to replace advice given to you by your health care provider. Make sure you discuss any questions you  have with your health care provider.  Document Released: 11/04/2014 Document Revised: 11/26/2015 Document Reviewed: 09/02/2014  Elsevier Interactive Patient Education © 2017 [employer] Inc[employer]

## 2016-10-26 ENCOUNTER — Ambulatory Visit
Admission: RE | Admit: 2016-10-26 | Discharge: 2016-10-26 | Disposition: A | Discharge status: Home / Self Care | Payer: BLUE CROSS/BLUE SHIELD | Source: Ambulatory Visit | Attending: Oncology | Admitting: Oncology

## 2016-10-26 ENCOUNTER — Other Ambulatory Visit: Payer: Self-pay | Admitting: Oncology

## 2016-10-26 DIAGNOSIS — R188 Other ascites: Secondary | ICD-10-CM

## 2016-10-27 ENCOUNTER — Inpatient Hospital Stay: Payer: BLUE CROSS/BLUE SHIELD

## 2016-10-27 VITALS — BP 102/69 | HR 83 | Resp 16

## 2016-10-27 DIAGNOSIS — C786 Secondary malignant neoplasm of retroperitoneum and peritoneum: Secondary | ICD-10-CM | POA: Diagnosis not present

## 2016-10-27 DIAGNOSIS — C801 Malignant (primary) neoplasm, unspecified: Principal | ICD-10-CM

## 2016-10-27 MED ORDER — SODIUM CHLORIDE 0.9% FLUSH
10.0000 mL | INTRAVENOUS | Status: DC | PRN
  Administered 2016-10-27: 10 mL
  Filled 2016-10-27: qty 10

## 2016-10-27 MED ORDER — PEGFILGRASTIM INJECTION 6 MG/0.6ML ~~LOC~~
6.0000 mg | PREFILLED_SYRINGE | Freq: Once | SUBCUTANEOUS | Status: AC
  Administered 2016-10-27: 6 mg via SUBCUTANEOUS
  Filled 2016-10-27: qty 0.6

## 2016-10-27 MED ORDER — HEPARIN SOD (PORK) LOCK FLUSH 100 UNIT/ML IV SOLN
INTRAVENOUS | Status: AC
  Filled 2016-10-27: qty 5

## 2016-10-27 MED ORDER — HEPARIN SOD (PORK) LOCK FLUSH 100 UNIT/ML IV SOLN
500.0000 [IU] | Freq: Once | INTRAVENOUS | Status: AC | PRN
  Administered 2016-10-27: 500 [IU]

## 2016-11-06 NOTE — Progress Notes (Signed)
[facility]  Telephone:(336) [phone] Fax:(336) [phone]  ID: Valerie Horton OB: 15-Mar-1961  MR#: [id number]  VEL#:[id number]  Patient Care Team: Valerie Roys, DO as PCP - General (Family Medicine) Clent Jacks, RN as Registered Nurse Garnetta Buddy, MD as Consulting Physician (Internal Medicine)  CHIEF COMPLAINT: Peritoneal carcinomatosis, metastatic adenocarcinoma of likely lower GI primary.  INTERVAL HISTORY: Patient returns to clinic today for further evaluation and consideration of cycle 8 of FOLFIRI. She continues to require a paracentesis approximately every 2-4 weeks. Her most recent one on October 26, 2016 did not reveal a significant amount of fluid.  She denies any abdominal pain. She has no neurological complaints. She denies any recent fevers or illnesses.  She has no chest pain or shortness of breath.  She denies and nausea, vomiting, constipation, or diarrhea. She has no melena or hematochezia. She has no urinary complains.  Patient offers no further specific complaints.  REVIEW OF SYSTEMS:   Review of Systems  Constitutional: Negative for fever, malaise/fatigue and weight loss.  Respiratory: Negative.  Negative for cough and shortness of breath.   Cardiovascular: Negative.  Negative for chest pain.  Gastrointestinal: Negative for abdominal pain, blood in stool, constipation, diarrhea, melena and vomiting.       Positive for abdominal bloating  Genitourinary: Negative.   Musculoskeletal: Negative.   Neurological: Positive for sensory change. Negative for weakness.  Psychiatric/Behavioral: The patient is not nervous/anxious.     As per HPI. Otherwise, a complete review of systems is negative.  PAST MEDICAL HISTORY: Past Medical History:  Diagnosis Date  . Acid reflux   . Allergic rhinitis   . Anxiety   . Arthritis    left knee  . Cancer ([place])   . Cervical spondylosis   . CTS (carpal tunnel syndrome)    Right  . Diverticulosis   .  HSV-2 (herpes simplex virus 2) infection   . Hyperlipidemia   . Ovarian failure   . Rosacea   . Wears contact lenses     PAST SURGICAL HISTORY: Past Surgical History:  Procedure Laterality Date  . ANTERIOR CRUCIATE LIGAMENT REPAIR Left   . CESAREAN SECTION    . COLONOSCOPY WITH PROPOFOL N/A 01/29/2016   Procedure: COLONOSCOPY WITH PROPOFOL;  Surgeon: Lucilla Lame, MD;  Location: [facility];  Service: Endoscopy;  Laterality: N/A;  . ESOPHAGOGASTRODUODENOSCOPY (EGD) WITH PROPOFOL N/A 01/29/2016   Procedure: ESOPHAGOGASTRODUODENOSCOPY (EGD) WITH PROPOFOL;  Surgeon: Lucilla Lame, MD;  Location: [facility];  Service: Endoscopy;  Laterality: N/A;  . PERIPHERAL VASCULAR CATHETERIZATION N/A 02/10/2016   Procedure: Glori Luis Cath Insertion;  Surgeon: Algernon Huxley, MD;  Location: [facility];  Service: Cardiovascular;  Laterality: N/A;    FAMILY HISTORY: Unknown.  Patient is adopted.     ADVANCED DIRECTIVES:    HEALTH MAINTENANCE: Social History  Substance Use Topics  . Smoking status: Former Research scientist (life sciences)  . Smokeless tobacco: Never Used     Comment: Quit in her 67's  . Alcohol use 0.0 oz/week     Comment: 1 drink/mo     Colonoscopy:  PAP:  Bone density:  Lipid panel:  No Known Allergies  Current Outpatient Prescriptions  Medication Sig Dispense Refill  . escitalopram (LEXAPRO) 10 MG tablet TAKE 1 TABLET (10 MG TOTAL) BY MOUTH DAILY. 90 tablet 1  . lidocaine-prilocaine (EMLA) cream Apply 1 application topically as needed. 30 g 3  . ondansetron (ZOFRAN) 8 MG tablet Take by mouth every 8 (eight) hours  as needed for nausea or vomiting.    . pantoprazole (PROTONIX) 40 MG tablet Take 1 tablet (40 mg total) by mouth daily. 30 tablet 2  . prochlorperazine (COMPAZINE) 10 MG tablet Take 10 mg by mouth every 6 (six) hours as needed for nausea or vomiting.    [place] Kitchen acyclovir (ZOVIRAX) 800 MG tablet Take 1 tablet (800 mg total) by mouth daily. (Patient not taking: Reported on  11/08/2016) 90 tablet 1   No current facility-administered medications for this visit.    Facility-Administered Medications Ordered in Other Visits  Medication Dose Route Frequency Provider Last Rate Last Dose  . heparin lock flush 100 unit/mL  500 Units Intravenous Once Lloyd Huger, MD      . sodium chloride flush (NS) 0.9 % injection 10 mL  10 mL Intravenous PRN Lloyd Huger, MD   10 mL at 08/16/16 0925    OBJECTIVE: Vitals:   11/08/16 1200  BP: 111/72  Pulse: 84  Temp: 98.2 F (36.8 C)     Body mass index is 22.98 kg/m.    ECOG FS:0 - Asymptomatic  General: Well-developed, well-nourished, no acute distress. Eyes: Pink conjunctiva, anicteric sclera. Lungs: Clear to auscultation bilaterally. Heart: Regular rate and rhythm. No rubs, murmurs, or gallops. Abdomen: Distended, nontender.  Musculoskeletal: No edema, cyanosis, or clubbing. Neuro: Alert, answering all questions appropriately. Cranial nerves grossly intact. Skin: No rashes or petechiae noted. Psych: Normal affect.   LAB RESULTS:  Lab Results  Component Value Date   NA 134 (L) 11/08/2016   K 3.7 11/08/2016   CL 103 11/08/2016   CO2 25 11/08/2016   GLUCOSE 101 (H) 11/08/2016   BUN 13 11/08/2016   CREATININE 0.73 11/08/2016   CALCIUM 8.7 (L) 11/08/2016   PROT 6.9 11/08/2016   ALBUMIN 3.5 11/08/2016   AST 29 11/08/2016   ALT 20 11/08/2016   ALKPHOS 179 (H) 11/08/2016   BILITOT 0.5 11/08/2016   GFRNONAA >60 11/08/2016   GFRAA >60 11/08/2016    Lab Results  Component Value Date   WBC 9.1 11/08/2016   NEUTROABS 6.7 (H) 11/08/2016   HGB 10.7 (L) 11/08/2016   HCT 31.5 (L) 11/08/2016   MCV 96.3 11/08/2016   PLT 144 (L) 11/08/2016   Lab Results  Component Value Date   CA125 86.6 (H) 03/29/2016     STUDIES: Nm Pet Image Restag (ps) Skull Base To Thigh  Result Date: 10/20/2016 CLINICAL DATA:  Subsequent treatment strategy for peritoneal carcinomatosis. EXAM: NUCLEAR MEDICINE PET SKULL  BASE TO THIGH TECHNIQUE: 13.1 mCi F-18 FDG was injected intravenously. Full-ring PET imaging was performed from the skull base to thigh after the radiotracer. CT data was obtained and used for attenuation correction and anatomic localization. FASTING BLOOD GLUCOSE:  Value: 96 mg/dl COMPARISON:  Multiple exams, including 07/11/2016 CT scan FINDINGS: NECK No hypermetabolic lymph nodes in the neck. CHEST Right IJ Port-A-Cath tip:  Cavoatrial junction. Trace left pleural effusion without a significant degree of hypermetabolic activity. No hypermetabolic thoracic adenopathy Mild atelectasis along both hemidiaphragms. Subtle nodularity at the right lung apex is likely primarily from pleuroparenchymal scarring and some of this was present on 01/05/2016, although there is a 3 by 5 mm apical nodule on image 76/3 which may be sub solid but is not definitely seen on the prior exam. No associated hypermetabolic activity. ABDOMEN/PELVIS Large volume ascites although less than 07/11/2016, all with extensive infiltration of the omentum. This omental infiltration demonstrates low-grade activity, with a representative region having a maximum  standard uptake value of 2.0 in the left abdomen. The ascites also demonstrates low-grade activity. For comparison purposes, background liver activity is 2.6 and background mediastinal blood pool activity is 1.5. Stable calcification along the dome of the right hepatic lobe on image 119/3. Spleen measures 12.3 by 5.6 by 12.1 cm (volume = 440 cm^3), borderline for splenomegaly. SKELETON There is diffuse accentuated metabolic activity in the bony structures, not appreciably focally abnormal but rather diffusely accentuated. IMPRESSION: 1. Reduced ascites compared to 07/11/16, although there is still a large volume of ascites and extensive infiltration of the omentum compatible with tumor. The omental infiltration and the ascites both demonstrate low-grade metabolic activity about midway between the  activity of the blood pool and the activity in the liver. 2. There is also a new small left pleural effusion, nonspecific for transudative or exudative etiology. 3. Diffuse marrow activity probably reflects therapy related marrow stimulation. 4. Mild atelectasis along both hemidiaphragms. 5. 3 by 5 mm sub solid right apical nodule was not seen on prior chest CT of 01/25/2016 and may merit surveillance. Electronically Signed   By: Van Clines M.D.   On: 10/20/2016 14:09   US Abdomen Limited  Addendum Date: 10/26/2016   ADDENDUM REPORT: 10/26/2016 16:22 ADDENDUM: Agree with final report below. Electronically Signed   By: Jerilynn Mages.  Shick M.D.   On: 10/26/2016 16:22   Result Date: 10/26/2016 CLINICAL DATA:  Recurrent ascites EXAM: LIMITED ABDOMEN ULTRASOUND FOR ASCITES TECHNIQUE: Limited ultrasound survey for ascites was performed in all four abdominal quadrants. COMPARISON:  Previous paracentesis FINDINGS: Limited US was performed viewing all 4 quadrants. Very small amount of ascites noted in RUQ. Too small to merit paracentesis and without safe window for access. No paracentesis was performed. IMPRESSION: Only minimal ascites in RUQ appreciated.  No safe window for access. No paracentesis performed Read by: Lavonia Drafts [facility] Electronically Signed: By: Jerilynn Mages.  Shick M.D. On: 10/26/2016 15:15   US Abdomen Limited  Result Date: 10/14/2016 CLINICAL DATA:  Peritoneal carcinomatosis, receiving chemotherapy, abdominal distension EXAM: LIMITED ABDOMEN ULTRASOUND FOR ASCITES TECHNIQUE: Limited ultrasound survey for ascites was performed in all four abdominal quadrants. COMPARISON:  09/16/2016 FINDINGS: Survey of the abdominal quadrants demonstrates a small amount of ascites in the right upper quadrant about the inferior liver margin. Otherwise no significant abdominal ascites in the lower abdomen or left abdomen. Therefore, therapeutic paracentesis not performed today. IMPRESSION: Small amount of abdominal ascites,  much less than last month. Therefore therapeutic paracentesis not performed. Findings discussed with the patient, who is in agreement. Electronically Signed   By: Jerilynn Mages.  Shick M.D.   On: 10/14/2016 15:45    ASSESSMENT: Peritoneal carcinomatosis, metastatic adenocarcinoma of likely lower GI primary.  PLAN:    1. Peritoneal carcinomatosis: Pathology results reviewed, immunohistochemistry suggested lower GI primary. PET scan results reviewed independently and reported as above with what appears to be stable disease. Clinically she is also improving since she requires paracentesis less frequently.  Of note, patient had a normal colonoscopy on October 16, 2014 in [place], [place]. Because of suspected progression of disease, patient's treatment was switched to FOLFIRI. Patient had consultation at [facility] recently who determined that no surgical or intraperitoneal chemotherapy would be beneficial. Proceed with cycle 8 of FOLFIRI today. Will not add Avastin since there seems to be no obvious benefit. Foundation 1 testing did not reveal any actionable mutations. Patient's albumin has significantly improved, therefore a clinical trial at [facility] is now possibility. Return to clinic in  2 days for pump removal and Neulasta and then in 2 weeks for consideration of cycle 9. Plan to reimage after cycle 12. 2: Genetic testing: Negative. 3. Anemia: Mild, monitor. 4. Thrombocytopenia: Proceed with treatment as above. 5. Neutropenia: Neulasta with the remainder treatments as above. 6. Peripheral neuropathy: Resolved. Monitor while receiving oxaliplatin. 7. Ascites: Improved. Patient's last dome and ultrasound on October 26, 2016 did not reveal enough fluid to safely remove. No paracentesis was performed.  Patient expressed understanding and was in agreement with this plan. She also understands that She can call clinic at any time with any questions, concerns, or complaints.     Lloyd Huger, MD  11/13/16 9:30 AM

## 2016-11-08 ENCOUNTER — Inpatient Hospital Stay: Payer: BLUE CROSS/BLUE SHIELD

## 2016-11-08 ENCOUNTER — Encounter: Payer: Self-pay | Admitting: Oncology

## 2016-11-08 ENCOUNTER — Inpatient Hospital Stay (HOSPITAL_BASED_OUTPATIENT_CLINIC_OR_DEPARTMENT_OTHER): Payer: BLUE CROSS/BLUE SHIELD | Admitting: Oncology

## 2016-11-08 ENCOUNTER — Inpatient Hospital Stay: Payer: BLUE CROSS/BLUE SHIELD | Attending: Oncology

## 2016-11-08 VITALS — BP 111/72 | HR 84 | Temp 98.2°F | Wt 131.4 lb

## 2016-11-08 DIAGNOSIS — K219 Gastro-esophageal reflux disease without esophagitis: Secondary | ICD-10-CM

## 2016-11-08 DIAGNOSIS — D649 Anemia, unspecified: Secondary | ICD-10-CM | POA: Diagnosis not present

## 2016-11-08 DIAGNOSIS — C786 Secondary malignant neoplasm of retroperitoneum and peritoneum: Secondary | ICD-10-CM | POA: Diagnosis present

## 2016-11-08 DIAGNOSIS — C801 Malignant (primary) neoplasm, unspecified: Principal | ICD-10-CM

## 2016-11-08 DIAGNOSIS — Z79899 Other long term (current) drug therapy: Secondary | ICD-10-CM

## 2016-11-08 DIAGNOSIS — Z87891 Personal history of nicotine dependence: Secondary | ICD-10-CM

## 2016-11-08 DIAGNOSIS — R188 Other ascites: Secondary | ICD-10-CM | POA: Insufficient documentation

## 2016-11-08 DIAGNOSIS — Z5111 Encounter for antineoplastic chemotherapy: Secondary | ICD-10-CM | POA: Diagnosis not present

## 2016-11-08 DIAGNOSIS — E785 Hyperlipidemia, unspecified: Secondary | ICD-10-CM

## 2016-11-08 DIAGNOSIS — D696 Thrombocytopenia, unspecified: Secondary | ICD-10-CM | POA: Diagnosis not present

## 2016-11-08 DIAGNOSIS — M1712 Unilateral primary osteoarthritis, left knee: Secondary | ICD-10-CM

## 2016-11-08 DIAGNOSIS — T451X5S Adverse effect of antineoplastic and immunosuppressive drugs, sequela: Secondary | ICD-10-CM | POA: Diagnosis not present

## 2016-11-08 DIAGNOSIS — D701 Agranulocytosis secondary to cancer chemotherapy: Secondary | ICD-10-CM | POA: Insufficient documentation

## 2016-11-08 LAB — CBC WITH DIFFERENTIAL/PLATELET
Basophils Absolute: 0 10*3/uL (ref 0–0.1)
Basophils Relative: 0 %
EOS ABS: 0 10*3/uL (ref 0–0.7)
EOS PCT: 1 %
HCT: 31.5 % — ABNORMAL LOW (ref 35.0–47.0)
Hemoglobin: 10.7 g/dL — ABNORMAL LOW (ref 12.0–16.0)
LYMPHS ABS: 1.6 10*3/uL (ref 1.0–3.6)
LYMPHS PCT: 18 %
MCH: 32.8 pg (ref 26.0–34.0)
MCHC: 34.1 g/dL (ref 32.0–36.0)
MCV: 96.3 fL (ref 80.0–100.0)
MONOS PCT: 8 %
Monocytes Absolute: 0.7 10*3/uL (ref 0.2–0.9)
Neutro Abs: 6.7 10*3/uL — ABNORMAL HIGH (ref 1.4–6.5)
Neutrophils Relative %: 73 %
PLATELETS: 144 10*3/uL — AB (ref 150–440)
RBC: 3.27 MIL/uL — AB (ref 3.80–5.20)
RDW: 17.4 % — ABNORMAL HIGH (ref 11.5–14.5)
WBC: 9.1 10*3/uL (ref 3.6–11.0)

## 2016-11-08 LAB — COMPREHENSIVE METABOLIC PANEL
ALK PHOS: 179 U/L — AB (ref 38–126)
ALT: 20 U/L (ref 14–54)
ANION GAP: 6 (ref 5–15)
AST: 29 U/L (ref 15–41)
Albumin: 3.5 g/dL (ref 3.5–5.0)
BUN: 13 mg/dL (ref 6–20)
CALCIUM: 8.7 mg/dL — AB (ref 8.9–10.3)
CHLORIDE: 103 mmol/L (ref 101–111)
CO2: 25 mmol/L (ref 22–32)
CREATININE: 0.73 mg/dL (ref 0.44–1.00)
Glucose, Bld: 101 mg/dL — ABNORMAL HIGH (ref 65–99)
Potassium: 3.7 mmol/L (ref 3.5–5.1)
SODIUM: 134 mmol/L — AB (ref 135–145)
TOTAL PROTEIN: 6.9 g/dL (ref 6.5–8.1)
Total Bilirubin: 0.5 mg/dL (ref 0.3–1.2)

## 2016-11-08 MED ORDER — DEXAMETHASONE SODIUM PHOSPHATE 10 MG/ML IJ SOLN
10.0000 mg | Freq: Once | INTRAMUSCULAR | Status: AC
  Administered 2016-11-08: 10 mg via INTRAVENOUS
  Filled 2016-11-08: qty 1

## 2016-11-08 MED ORDER — SODIUM CHLORIDE 0.9 % IV SOLN
Freq: Once | INTRAVENOUS | Status: AC
  Administered 2016-11-08: 11:00:00 via INTRAVENOUS
  Filled 2016-11-08: qty 1000

## 2016-11-08 MED ORDER — FLUOROURACIL CHEMO INJECTION 2.5 GM/50ML
400.0000 mg/m2 | Freq: Once | INTRAVENOUS | Status: AC
  Administered 2016-11-08: 700 mg via INTRAVENOUS
  Filled 2016-11-08: qty 14

## 2016-11-08 MED ORDER — DEXTROSE 5 % IV SOLN
700.0000 mg | Freq: Once | INTRAVENOUS | Status: AC
  Administered 2016-11-08: 700 mg via INTRAVENOUS
  Filled 2016-11-08: qty 35

## 2016-11-08 MED ORDER — IRINOTECAN HCL CHEMO INJECTION 100 MG/5ML
180.0000 mg/m2 | Freq: Once | INTRAVENOUS | Status: AC
  Administered 2016-11-08: 300 mg via INTRAVENOUS
  Filled 2016-11-08: qty 15

## 2016-11-08 MED ORDER — FLUOROURACIL CHEMO INJECTION 5 GM/100ML
2400.0000 mg/m2 | INTRAVENOUS | Status: DC
  Administered 2016-11-08: 4100 mg via INTRAVENOUS
  Filled 2016-11-08: qty 82

## 2016-11-08 MED ORDER — PALONOSETRON HCL INJECTION 0.25 MG/5ML
0.2500 mg | Freq: Once | INTRAVENOUS | Status: AC
  Administered 2016-11-08: 0.25 mg via INTRAVENOUS
  Filled 2016-11-08: qty 5

## 2016-11-08 MED ORDER — HEPARIN SOD (PORK) LOCK FLUSH 100 UNIT/ML IV SOLN: 500.0000 [IU] | Freq: Once | INTRAVENOUS | Status: DC | PRN

## 2016-11-08 MED ORDER — SODIUM CHLORIDE 0.9 % IV SOLN: 10.0000 mg | Freq: Once | INTRAVENOUS | Status: DC

## 2016-11-08 MED ORDER — ATROPINE SULFATE 1 MG/ML IJ SOLN
0.5000 mg | Freq: Once | INTRAMUSCULAR | Status: AC | PRN
  Administered 2016-11-08: 0.5 mg via INTRAVENOUS
  Filled 2016-11-08: qty 1

## 2016-11-10 ENCOUNTER — Inpatient Hospital Stay: Payer: BLUE CROSS/BLUE SHIELD

## 2016-11-10 DIAGNOSIS — C786 Secondary malignant neoplasm of retroperitoneum and peritoneum: Secondary | ICD-10-CM

## 2016-11-10 DIAGNOSIS — C801 Malignant (primary) neoplasm, unspecified: Principal | ICD-10-CM

## 2016-11-10 MED ORDER — SODIUM CHLORIDE 0.9% FLUSH
10.0000 mL | INTRAVENOUS | Status: DC | PRN
  Administered 2016-11-10: 10 mL
  Filled 2016-11-10: qty 10

## 2016-11-10 MED ORDER — PEGFILGRASTIM INJECTION 6 MG/0.6ML ~~LOC~~
6.0000 mg | PREFILLED_SYRINGE | Freq: Once | SUBCUTANEOUS | Status: AC
  Administered 2016-11-10: 6 mg via SUBCUTANEOUS
  Filled 2016-11-10: qty 0.6

## 2016-11-10 MED ORDER — HEPARIN SOD (PORK) LOCK FLUSH 100 UNIT/ML IV SOLN
500.0000 [IU] | Freq: Once | INTRAVENOUS | Status: AC | PRN
  Administered 2016-11-10: 500 [IU]
  Filled 2016-11-10: qty 5

## 2016-11-16 ENCOUNTER — Encounter: Payer: Self-pay | Admitting: Oncology

## 2016-11-16 ENCOUNTER — Other Ambulatory Visit: Payer: Self-pay

## 2016-11-16 DIAGNOSIS — R188 Other ascites: Secondary | ICD-10-CM

## 2016-11-20 NOTE — Progress Notes (Signed)
[place]  Telephone:(336) [phone] Fax:(336) [phone]  ID: Valerie Horton OB: 06/01/1961  MR#: [id number]  UKG#:[id number]  Patient Care Team: Valerie Roys, DO as PCP - General (Family Medicine) Clent Jacks, RN as Registered Nurse Garnetta Buddy, MD as Consulting Physician (Internal Medicine)  CHIEF COMPLAINT: Peritoneal carcinomatosis, metastatic adenocarcinoma of likely lower GI primary.  INTERVAL HISTORY: Patient returns to clinic today for further evaluation and consideration of cycle 9 of FOLFIRI. Her paracentesis requirement has significantly decreased. Her most recent abdominal ultrasound on October 26, 2016 did not reveal a significant amount of fluid.  She denies any abdominal pain, but has mild bloating. She has no neurological complaints. She denies any recent fevers or illnesses.  She has no chest pain or shortness of breath.  She denies and nausea, vomiting, constipation, or diarrhea. She has no melena or hematochezia. She has no urinary complains.  Patient offers no further specific complaints.  REVIEW OF SYSTEMS:   Review of Systems  Constitutional: Negative for fever, malaise/fatigue and weight loss.  Respiratory: Negative.  Negative for cough and shortness of breath.   Cardiovascular: Negative.  Negative for chest pain.  Gastrointestinal: Negative for abdominal pain, blood in stool, constipation, diarrhea, melena and vomiting.       Positive for abdominal bloating  Genitourinary: Negative.   Musculoskeletal: Negative.   Neurological: Positive for sensory change. Negative for weakness.  Psychiatric/Behavioral: The patient is not nervous/anxious.     As per HPI. Otherwise, a complete review of systems is negative.  PAST MEDICAL HISTORY: Past Medical History:  Diagnosis Date  . Acid reflux   . Allergic rhinitis   . Anxiety   . Arthritis    left knee  . Cancer ([place])   . Cervical spondylosis   . CTS (carpal tunnel syndrome)    Right    . Diverticulosis   . HSV-2 (herpes simplex virus 2) infection   . Hyperlipidemia   . Ovarian failure   . Rosacea   . Wears contact lenses     PAST SURGICAL HISTORY: Past Surgical History:  Procedure Laterality Date  . ANTERIOR CRUCIATE LIGAMENT REPAIR Left   . CESAREAN SECTION    . COLONOSCOPY WITH PROPOFOL N/A 01/29/2016   Procedure: COLONOSCOPY WITH PROPOFOL;  Surgeon: Lucilla Lame, MD;  Location: [facility];  Service: Endoscopy;  Laterality: N/A;  . ESOPHAGOGASTRODUODENOSCOPY (EGD) WITH PROPOFOL N/A 01/29/2016   Procedure: ESOPHAGOGASTRODUODENOSCOPY (EGD) WITH PROPOFOL;  Surgeon: Lucilla Lame, MD;  Location: [facility];  Service: Endoscopy;  Laterality: N/A;  . PERIPHERAL VASCULAR CATHETERIZATION N/A 02/10/2016   Procedure: Glori Luis Cath Insertion;  Surgeon: Algernon Huxley, MD;  Location: [facility];  Service: Cardiovascular;  Laterality: N/A;    FAMILY HISTORY: Unknown.  Patient is adopted.     ADVANCED DIRECTIVES:    HEALTH MAINTENANCE: Social History  Substance Use Topics  . Smoking status: Former Research scientist (life sciences)  . Smokeless tobacco: Never Used     Comment: Quit in her 65's  . Alcohol use 0.0 oz/week     Comment: 1 drink/mo     Colonoscopy:  PAP:  Bone density:  Lipid panel:  No Known Allergies  Current Outpatient Prescriptions  Medication Sig Dispense Refill  . acyclovir (ZOVIRAX) 800 MG tablet Take 1 tablet (800 mg total) by mouth daily. 90 tablet 1  . escitalopram (LEXAPRO) 10 MG tablet TAKE 1 TABLET (10 MG TOTAL) BY MOUTH DAILY. 90 tablet 1  . lidocaine-prilocaine (EMLA) cream Apply 1  application topically as needed. 30 g 3  . ondansetron (ZOFRAN) 8 MG tablet Take by mouth every 8 (eight) hours as needed for nausea or vomiting.    . pantoprazole (PROTONIX) 40 MG tablet Take 1 tablet (40 mg total) by mouth daily. 30 tablet 2  . prochlorperazine (COMPAZINE) 10 MG tablet Take 10 mg by mouth every 6 (six) hours as needed for nausea or vomiting.      No current facility-administered medications for this visit.    Facility-Administered Medications Ordered in Other Visits  Medication Dose Route Frequency Provider Last Rate Last Dose  . heparin lock flush 100 unit/mL  500 Units Intravenous Once Lloyd Huger, MD      . sodium chloride flush (NS) 0.9 % injection 10 mL  10 mL Intravenous PRN Lloyd Huger, MD   10 mL at 08/16/16 0925    OBJECTIVE: Vitals:   11/22/16 0938  BP: 111/75  Pulse: 86  Resp: 20  Temp: 97.9 F (36.6 C)     Body mass index is 23.61 kg/m.    ECOG FS:0 - Asymptomatic  General: Well-developed, well-nourished, no acute distress. Eyes: Pink conjunctiva, anicteric sclera. Lungs: Clear to auscultation bilaterally. Heart: Regular rate and rhythm. No rubs, murmurs, or gallops. Abdomen: Mildly distended, nontender.  Musculoskeletal: No edema, cyanosis, or clubbing. Neuro: Alert, answering all questions appropriately. Cranial nerves grossly intact. Skin: No rashes or petechiae noted. Psych: Normal affect.   LAB RESULTS:  Lab Results  Component Value Date   NA 138 11/22/2016   K 4.1 11/22/2016   CL 107 11/22/2016   CO2 28 11/22/2016   GLUCOSE 97 11/22/2016   BUN 15 11/22/2016   CREATININE 0.67 11/22/2016   CALCIUM 8.8 (L) 11/22/2016   PROT 6.8 11/22/2016   ALBUMIN 3.7 11/22/2016   AST 26 11/22/2016   ALT 17 11/22/2016   ALKPHOS 196 (H) 11/22/2016   BILITOT 0.5 11/22/2016   GFRNONAA >60 11/22/2016   GFRAA >60 11/22/2016    Lab Results  Component Value Date   WBC 9.7 11/22/2016   NEUTROABS 7.1 (H) 11/22/2016   HGB 11.0 (L) 11/22/2016   HCT 32.8 (L) 11/22/2016   MCV 96.3 11/22/2016   PLT 135 (L) 11/22/2016   Lab Results  Component Value Date   CA125 86.6 (H) 03/29/2016     STUDIES: US Abdomen Limited  Result Date: 11/23/2016 CLINICAL DATA:  Ascites and malignancy EXAM: LIMITED ABDOMEN ULTRASOUND FOR ASCITES TECHNIQUE: Limited ultrasound survey for ascites was performed in  all four abdominal quadrants. COMPARISON:  None. FINDINGS: Imaging in the abdomen demonstrates minimal ascites. Paracentesis was not performed. IMPRESSION: Imaging in the abdomen demonstrates minimal ascites. Paracentesis was not performed. Electronically Signed   By: Marybelle Killings M.D.   On: 11/23/2016 14:18    ASSESSMENT: Peritoneal carcinomatosis, metastatic adenocarcinoma of likely lower GI primary.  PLAN:    1. Peritoneal carcinomatosis: Pathology results reviewed, immunohistochemistry suggested lower GI primary. PET scan results reviewed independently and reported as above with what appears to be stable disease. Clinically she is also improving since she requires paracentesis less frequently.  Of note, patient had a normal colonoscopy on October 16, 2014 in [place], [place]. Because of suspected progression of disease, patient's treatment was switched to FOLFIRI. Patient had consultation at [facility] who determined that no surgical or intraperitoneal chemotherapy would be beneficial. Proceed with cycle 9 of FOLFIRI today. Will not add Avastin since there seems to be no obvious benefit. Foundation 1 testing did not  reveal any actionable mutations. Patient's albumin has significantly improved, therefore a clinical trial at [facility] is now possibility. Return to clinic in 2 days for pump removal and Neulasta and then in 2 weeks for consideration of cycle 10. Plan to reimage after cycle 12. 2: Genetic testing: Negative. 3. Anemia: Mild, monitor. 4. Thrombocytopenia: Proceed with treatment as above. 5. Neutropenia: Neulasta with the remainder treatments as above. 6. Peripheral neuropathy: Resolved. 7. Ascites: Improved. Patient's last abdominal ultrasound on October 26, 2016 did not reveal enough fluid to safely remove. No paracentesis was performed.  Patient expressed understanding and was in agreement with this plan. She also understands that She can call clinic at any time with any  questions, concerns, or complaints.     Lloyd Huger, MD 11/28/16 6:06 PM

## 2016-11-22 ENCOUNTER — Inpatient Hospital Stay (HOSPITAL_BASED_OUTPATIENT_CLINIC_OR_DEPARTMENT_OTHER): Payer: BLUE CROSS/BLUE SHIELD | Admitting: Oncology

## 2016-11-22 ENCOUNTER — Inpatient Hospital Stay: Payer: BLUE CROSS/BLUE SHIELD

## 2016-11-22 VITALS — BP 111/75 | HR 86 | Temp 97.9°F | Resp 20 | Wt 135.0 lb

## 2016-11-22 DIAGNOSIS — D701 Agranulocytosis secondary to cancer chemotherapy: Secondary | ICD-10-CM

## 2016-11-22 DIAGNOSIS — C786 Secondary malignant neoplasm of retroperitoneum and peritoneum: Secondary | ICD-10-CM

## 2016-11-22 DIAGNOSIS — M1712 Unilateral primary osteoarthritis, left knee: Secondary | ICD-10-CM | POA: Diagnosis not present

## 2016-11-22 DIAGNOSIS — E785 Hyperlipidemia, unspecified: Secondary | ICD-10-CM

## 2016-11-22 DIAGNOSIS — D649 Anemia, unspecified: Secondary | ICD-10-CM

## 2016-11-22 DIAGNOSIS — C801 Malignant (primary) neoplasm, unspecified: Principal | ICD-10-CM

## 2016-11-22 DIAGNOSIS — D696 Thrombocytopenia, unspecified: Secondary | ICD-10-CM | POA: Diagnosis not present

## 2016-11-22 DIAGNOSIS — K219 Gastro-esophageal reflux disease without esophagitis: Secondary | ICD-10-CM

## 2016-11-22 DIAGNOSIS — R188 Other ascites: Secondary | ICD-10-CM | POA: Diagnosis not present

## 2016-11-22 DIAGNOSIS — T451X5S Adverse effect of antineoplastic and immunosuppressive drugs, sequela: Secondary | ICD-10-CM

## 2016-11-22 DIAGNOSIS — Z79899 Other long term (current) drug therapy: Secondary | ICD-10-CM

## 2016-11-22 DIAGNOSIS — Z87891 Personal history of nicotine dependence: Secondary | ICD-10-CM

## 2016-11-22 LAB — CBC WITH DIFFERENTIAL/PLATELET
BASOS ABS: 0 10*3/uL (ref 0–0.1)
BASOS PCT: 0 %
EOS PCT: 1 %
Eosinophils Absolute: 0 10*3/uL (ref 0–0.7)
HEMATOCRIT: 32.8 % — AB (ref 35.0–47.0)
Hemoglobin: 11 g/dL — ABNORMAL LOW (ref 12.0–16.0)
Lymphocytes Relative: 19 %
Lymphs Abs: 1.8 10*3/uL (ref 1.0–3.6)
MCH: 32.4 pg (ref 26.0–34.0)
MCHC: 33.6 g/dL (ref 32.0–36.0)
MCV: 96.3 fL (ref 80.0–100.0)
MONO ABS: 0.7 10*3/uL (ref 0.2–0.9)
MONOS PCT: 7 %
NEUTROS ABS: 7.1 10*3/uL — AB (ref 1.4–6.5)
Neutrophils Relative %: 73 %
PLATELETS: 135 10*3/uL — AB (ref 150–440)
RBC: 3.41 MIL/uL — ABNORMAL LOW (ref 3.80–5.20)
RDW: 17.8 % — AB (ref 11.5–14.5)
WBC: 9.7 10*3/uL (ref 3.6–11.0)

## 2016-11-22 LAB — COMPREHENSIVE METABOLIC PANEL
ALBUMIN: 3.7 g/dL (ref 3.5–5.0)
ALT: 17 U/L (ref 14–54)
AST: 26 U/L (ref 15–41)
Alkaline Phosphatase: 196 U/L — ABNORMAL HIGH (ref 38–126)
Anion gap: 3 — ABNORMAL LOW (ref 5–15)
BILIRUBIN TOTAL: 0.5 mg/dL (ref 0.3–1.2)
BUN: 15 mg/dL (ref 6–20)
CHLORIDE: 107 mmol/L (ref 101–111)
CO2: 28 mmol/L (ref 22–32)
Calcium: 8.8 mg/dL — ABNORMAL LOW (ref 8.9–10.3)
Creatinine, Ser: 0.67 mg/dL (ref 0.44–1.00)
GFR calc Af Amer: >60 mL/min (ref >60)
Glucose, Bld: 97 mg/dL (ref 65–99)
POTASSIUM: 4.1 mmol/L (ref 3.5–5.1)
Sodium: 138 mmol/L (ref 135–145)
TOTAL PROTEIN: 6.8 g/dL (ref 6.5–8.1)

## 2016-11-22 MED ORDER — SODIUM CHLORIDE 0.9 % IV SOLN: 10.0000 mg | Freq: Once | INTRAVENOUS | Status: DC

## 2016-11-22 MED ORDER — ATROPINE SULFATE 1 MG/ML IJ SOLN
0.5000 mg | Freq: Once | INTRAMUSCULAR | Status: AC | PRN
  Administered 2016-11-22: 0.5 mg via INTRAVENOUS

## 2016-11-22 MED ORDER — FLUOROURACIL CHEMO INJECTION 2.5 GM/50ML
400.0000 mg/m2 | Freq: Once | INTRAVENOUS | Status: AC
  Administered 2016-11-22: 700 mg via INTRAVENOUS
  Filled 2016-11-22: qty 14

## 2016-11-22 MED ORDER — DEXAMETHASONE SODIUM PHOSPHATE 10 MG/ML IJ SOLN
10.0000 mg | Freq: Once | INTRAMUSCULAR | Status: AC
  Administered 2016-11-22: 10 mg via INTRAVENOUS

## 2016-11-22 MED ORDER — HEPARIN SOD (PORK) LOCK FLUSH 100 UNIT/ML IV SOLN: 500.0000 [IU] | Freq: Once | INTRAVENOUS | Status: DC

## 2016-11-22 MED ORDER — SODIUM CHLORIDE 0.9% FLUSH
10.0000 mL | INTRAVENOUS | Status: DC | PRN
  Administered 2016-11-22: 10 mL via INTRAVENOUS
  Filled 2016-11-22: qty 10

## 2016-11-22 MED ORDER — SODIUM CHLORIDE 0.9 % IV SOLN
Freq: Once | INTRAVENOUS | Status: AC
  Administered 2016-11-22: 10:00:00 via INTRAVENOUS
  Filled 2016-11-22: qty 1000

## 2016-11-22 MED ORDER — IRINOTECAN HCL CHEMO INJECTION 100 MG/5ML
180.0000 mg/m2 | Freq: Once | INTRAVENOUS | Status: AC
  Administered 2016-11-22: 300 mg via INTRAVENOUS
  Filled 2016-11-22: qty 15

## 2016-11-22 MED ORDER — SODIUM CHLORIDE 0.9 % IV SOLN
2400.0000 mg/m2 | INTRAVENOUS | Status: DC
  Administered 2016-11-22: 4100 mg via INTRAVENOUS
  Filled 2016-11-22 (×4): qty 82

## 2016-11-22 MED ORDER — PALONOSETRON HCL INJECTION 0.25 MG/5ML
0.2500 mg | Freq: Once | INTRAVENOUS | Status: AC
  Administered 2016-11-22: 0.25 mg via INTRAVENOUS

## 2016-11-22 MED ORDER — LEUCOVORIN CALCIUM INJECTION 350 MG
700.0000 mg | Freq: Once | INTRAVENOUS | Status: AC
  Administered 2016-11-22: 700 mg via INTRAVENOUS
  Filled 2016-11-22: qty 35

## 2016-11-22 NOTE — Progress Notes (Signed)
Patient denies any concerns today.  

## 2016-11-23 ENCOUNTER — Other Ambulatory Visit: Payer: Self-pay | Admitting: Oncology

## 2016-11-23 ENCOUNTER — Ambulatory Visit
Admission: RE | Admit: 2016-11-23 | Discharge: 2016-11-23 | Disposition: A | Discharge status: Home / Self Care | Payer: BLUE CROSS/BLUE SHIELD | Source: Ambulatory Visit | Attending: Oncology | Admitting: Oncology

## 2016-11-23 DIAGNOSIS — R188 Other ascites: Secondary | ICD-10-CM

## 2016-11-24 ENCOUNTER — Inpatient Hospital Stay: Payer: BLUE CROSS/BLUE SHIELD

## 2016-11-24 VITALS — BP 104/67 | HR 70 | Temp 96.9°F | Resp 18

## 2016-11-24 DIAGNOSIS — C786 Secondary malignant neoplasm of retroperitoneum and peritoneum: Secondary | ICD-10-CM

## 2016-11-24 DIAGNOSIS — C801 Malignant (primary) neoplasm, unspecified: Principal | ICD-10-CM

## 2016-11-24 MED ORDER — HEPARIN SOD (PORK) LOCK FLUSH 100 UNIT/ML IV SOLN
500.0000 [IU] | Freq: Once | INTRAVENOUS | Status: AC | PRN
  Administered 2016-11-24: 500 [IU]
  Filled 2016-11-24: qty 5

## 2016-11-24 MED ORDER — PEGFILGRASTIM INJECTION 6 MG/0.6ML ~~LOC~~
6.0000 mg | PREFILLED_SYRINGE | Freq: Once | SUBCUTANEOUS | Status: AC
Start: 2016-11-24 — End: 2016-11-24
  Administered 2016-11-24: 6 mg via SUBCUTANEOUS
  Filled 2016-11-24: qty 0.6

## 2016-11-24 MED ORDER — SODIUM CHLORIDE 0.9% FLUSH
10.0000 mL | INTRAVENOUS | Status: DC | PRN
  Filled 2016-11-24: qty 10

## 2016-12-05 NOTE — Progress Notes (Signed)
[facility]  Telephone:(336) [phone] Fax:(336) [phone]  ID: Valerie Horton OB: September 09, 1960  MR#: [id number]  OAC#:[id number]  Patient Care Team: Valerie Roys, DO as PCP - General (Family Medicine) Clent Jacks, RN as Registered Nurse Garnetta Buddy, MD as Consulting Physician (Internal Medicine)  CHIEF COMPLAINT: Peritoneal carcinomatosis, metastatic adenocarcinoma of likely lower GI primary.  INTERVAL HISTORY: Patient returns to clinic today for further evaluation and consideration of cycle 10 of FOLFIRI. Her paracentesis requirement has significantly decreased. She denies any abdominal pain, but continues to have mild bloating. She has no neurological complaints. She denies any recent fevers or illnesses.  She has no chest pain or shortness of breath.  She denies and nausea, vomiting, constipation, or diarrhea. She has no melena or hematochezia. She has no urinary complains.  Patient offers no further specific complaints.  REVIEW OF SYSTEMS:   Review of Systems  Constitutional: Negative for fever, malaise/fatigue and weight loss.  Respiratory: Negative.  Negative for cough and shortness of breath.   Cardiovascular: Negative.  Negative for chest pain.  Gastrointestinal: Negative for abdominal pain, blood in stool, constipation, diarrhea, melena and vomiting.       Positive for abdominal bloating  Genitourinary: Negative.   Musculoskeletal: Negative.   Neurological: Positive for sensory change. Negative for weakness.  Psychiatric/Behavioral: The patient is not nervous/anxious.     As per HPI. Otherwise, a complete review of systems is negative.  PAST MEDICAL HISTORY: Past Medical History:  Diagnosis Date  . Acid reflux   . Allergic rhinitis   . Anxiety   . Arthritis    left knee  . Cancer ([place])   . Cervical spondylosis   . CTS (carpal tunnel syndrome)    Right  . Diverticulosis   . HSV-2 (herpes simplex virus 2) infection   . Hyperlipidemia     . Ovarian failure   . Rosacea   . Wears contact lenses     PAST SURGICAL HISTORY: Past Surgical History:  Procedure Laterality Date  . ANTERIOR CRUCIATE LIGAMENT REPAIR Left   . CESAREAN SECTION    . COLONOSCOPY WITH PROPOFOL N/A 01/29/2016   Procedure: COLONOSCOPY WITH PROPOFOL;  Surgeon: Lucilla Lame, MD;  Location: [facility];  Service: Endoscopy;  Laterality: N/A;  . ESOPHAGOGASTRODUODENOSCOPY (EGD) WITH PROPOFOL N/A 01/29/2016   Procedure: ESOPHAGOGASTRODUODENOSCOPY (EGD) WITH PROPOFOL;  Surgeon: Lucilla Lame, MD;  Location: [facility];  Service: Endoscopy;  Laterality: N/A;  . PERIPHERAL VASCULAR CATHETERIZATION N/A 02/10/2016   Procedure: Glori Luis Cath Insertion;  Surgeon: Algernon Huxley, MD;  Location: [facility];  Service: Cardiovascular;  Laterality: N/A;    FAMILY HISTORY: Unknown.  Patient is adopted.     ADVANCED DIRECTIVES:    HEALTH MAINTENANCE: Social History  Substance Use Topics  . Smoking status: Former Research scientist (life sciences)  . Smokeless tobacco: Never Used     Comment: Quit in her 42's  . Alcohol use 0.0 oz/week     Comment: 1 drink/mo     Colonoscopy:  PAP:  Bone density:  Lipid panel:  No Known Allergies  Current Outpatient Prescriptions  Medication Sig Dispense Refill  . acyclovir (ZOVIRAX) 800 MG tablet Take 1 tablet (800 mg total) by mouth daily. 90 tablet 1  . escitalopram (LEXAPRO) 10 MG tablet TAKE 1 TABLET (10 MG TOTAL) BY MOUTH DAILY. 90 tablet 1  . lidocaine-prilocaine (EMLA) cream Apply 1 application topically as needed. 30 g 3  . ondansetron (ZOFRAN) 8 MG tablet Take by  mouth every 8 (eight) hours as needed for nausea or vomiting.    . pantoprazole (PROTONIX) 40 MG tablet Take 1 tablet (40 mg total) by mouth daily. 30 tablet 2  . prochlorperazine (COMPAZINE) 10 MG tablet Take 10 mg by mouth every 6 (six) hours as needed for nausea or vomiting.     No current facility-administered medications for this visit.     Facility-Administered Medications Ordered in Other Visits  Medication Dose Route Frequency Provider Last Rate Last Dose  . heparin lock flush 100 unit/mL  500 Units Intravenous Once Lloyd Huger, MD      . sodium chloride flush (NS) 0.9 % injection 10 mL  10 mL Intravenous PRN Lloyd Huger, MD   10 mL at 08/16/16 0925    OBJECTIVE: Vitals:   12/06/16 0905  BP: 106/64  Pulse: 76  Resp: 16  Temp: (!) 96.4 F (35.8 C)     Body mass index is 24.19 kg/m.    ECOG FS:0 - Asymptomatic  General: Well-developed, well-nourished, no acute distress. Eyes: Pink conjunctiva, anicteric sclera. Lungs: Clear to auscultation bilaterally. Heart: Regular rate and rhythm. No rubs, murmurs, or gallops. Abdomen: Mildly distended, nontender.  Musculoskeletal: No edema, cyanosis, or clubbing. Neuro: Alert, answering all questions appropriately. Cranial nerves grossly intact. Skin: No rashes or petechiae noted. Psych: Normal affect.   LAB RESULTS:  Lab Results  Component Value Date   NA 137 12/06/2016   K 3.9 12/06/2016   CL 106 12/06/2016   CO2 26 12/06/2016   GLUCOSE 101 (H) 12/06/2016   BUN 13 12/06/2016   CREATININE 0.66 12/06/2016   CALCIUM 8.7 (L) 12/06/2016   PROT 6.6 12/06/2016   ALBUMIN 3.5 12/06/2016   AST 28 12/06/2016   ALT 20 12/06/2016   ALKPHOS 190 (H) 12/06/2016   BILITOT 0.5 12/06/2016   GFRNONAA >60 12/06/2016   GFRAA >60 12/06/2016    Lab Results  Component Value Date   WBC 9.0 12/06/2016   NEUTROABS 6.7 (H) 12/06/2016   HGB 10.9 (L) 12/06/2016   HCT 32.2 (L) 12/06/2016   MCV 94.9 12/06/2016   PLT 129 (L) 12/06/2016   Lab Results  Component Value Date   CA125 86.6 (H) 03/29/2016     STUDIES: US Abdomen Limited  Result Date: 11/23/2016 CLINICAL DATA:  Ascites and malignancy EXAM: LIMITED ABDOMEN ULTRASOUND FOR ASCITES TECHNIQUE: Limited ultrasound survey for ascites was performed in all four abdominal quadrants. COMPARISON:  None. FINDINGS:  Imaging in the abdomen demonstrates minimal ascites. Paracentesis was not performed. IMPRESSION: Imaging in the abdomen demonstrates minimal ascites. Paracentesis was not performed. Electronically Signed   By: Marybelle Killings M.D.   On: 11/23/2016 14:18    ASSESSMENT: Peritoneal carcinomatosis, metastatic adenocarcinoma of likely lower GI primary.  PLAN:    1. Peritoneal carcinomatosis: Pathology results reviewed, immunohistochemistry suggested lower GI primary. PET scan results reviewed independently and reported as above with what appears to be stable disease. Clinically she is also improving since she requires paracentesis less frequently. Her last paracentesis was on September 16, 2016. Of note, patient had a normal colonoscopy on October 16, 2014 in [place], [place]. Because of suspected progression of disease, patient's treatment was switched to FOLFIRI. Patient had consultation at [facility] who determined that no surgical or intraperitoneal chemotherapy would be beneficial. Proceed with cycle 10 of FOLFIRI today. Will not add Avastin since there seems to be no obvious benefit. Foundation 1 testing did not reveal any actionable mutations. Patient's albumin has  significantly improved, therefore a clinical trial at [facility] is now possibility which patient is currently pursuing. Return to clinic in 2 days for pump removal and Neulasta and then in 2 weeks for consideration of cycle 11. Plan to reimage after cycle 12. 2: Genetic testing: Negative. 3. Anemia: Mild, monitor. 4. Thrombocytopenia: Proceed with treatment as above. 5. Neutropenia: Neulasta with the remainder treatments as above. 6. Peripheral neuropathy: Resolved. 7. Ascites: Improved. Patient's last abdominal ultrasound on Nov 23, 2016 did not reveal enough fluid to safely remove. No paracentesis was performed. Her last paracentesis was on September 16, 2016.  Patient expressed understanding and was in agreement with this plan.  She also understands that She can call clinic at any time with any questions, concerns, or complaints.     Lloyd Huger, MD 12/11/16 9:48 AM

## 2016-12-06 ENCOUNTER — Inpatient Hospital Stay (HOSPITAL_BASED_OUTPATIENT_CLINIC_OR_DEPARTMENT_OTHER): Payer: BLUE CROSS/BLUE SHIELD | Admitting: Oncology

## 2016-12-06 ENCOUNTER — Inpatient Hospital Stay: Payer: BLUE CROSS/BLUE SHIELD

## 2016-12-06 ENCOUNTER — Inpatient Hospital Stay: Payer: BLUE CROSS/BLUE SHIELD | Attending: Oncology

## 2016-12-06 VITALS — BP 106/64 | HR 76 | Temp 96.4°F | Resp 16 | Wt 138.3 lb

## 2016-12-06 DIAGNOSIS — Z87891 Personal history of nicotine dependence: Secondary | ICD-10-CM | POA: Insufficient documentation

## 2016-12-06 DIAGNOSIS — R188 Other ascites: Secondary | ICD-10-CM

## 2016-12-06 DIAGNOSIS — Z79899 Other long term (current) drug therapy: Secondary | ICD-10-CM | POA: Diagnosis not present

## 2016-12-06 DIAGNOSIS — Z7689 Persons encountering health services in other specified circumstances: Secondary | ICD-10-CM | POA: Diagnosis not present

## 2016-12-06 DIAGNOSIS — T451X5S Adverse effect of antineoplastic and immunosuppressive drugs, sequela: Secondary | ICD-10-CM | POA: Diagnosis not present

## 2016-12-06 DIAGNOSIS — K219 Gastro-esophageal reflux disease without esophagitis: Secondary | ICD-10-CM

## 2016-12-06 DIAGNOSIS — C801 Malignant (primary) neoplasm, unspecified: Principal | ICD-10-CM

## 2016-12-06 DIAGNOSIS — D696 Thrombocytopenia, unspecified: Secondary | ICD-10-CM | POA: Insufficient documentation

## 2016-12-06 DIAGNOSIS — D649 Anemia, unspecified: Secondary | ICD-10-CM

## 2016-12-06 DIAGNOSIS — C786 Secondary malignant neoplasm of retroperitoneum and peritoneum: Secondary | ICD-10-CM | POA: Insufficient documentation

## 2016-12-06 DIAGNOSIS — Z5111 Encounter for antineoplastic chemotherapy: Secondary | ICD-10-CM | POA: Insufficient documentation

## 2016-12-06 DIAGNOSIS — D701 Agranulocytosis secondary to cancer chemotherapy: Secondary | ICD-10-CM

## 2016-12-06 DIAGNOSIS — E785 Hyperlipidemia, unspecified: Secondary | ICD-10-CM

## 2016-12-06 LAB — CBC WITH DIFFERENTIAL/PLATELET
BASOS ABS: 0 10*3/uL (ref 0–0.1)
Basophils Relative: 0 %
EOS PCT: 1 %
Eosinophils Absolute: 0.1 10*3/uL (ref 0–0.7)
HCT: 32.2 % — ABNORMAL LOW (ref 35.0–47.0)
Hemoglobin: 10.9 g/dL — ABNORMAL LOW (ref 12.0–16.0)
Lymphocytes Relative: 17 %
Lymphs Abs: 1.5 10*3/uL (ref 1.0–3.6)
MCH: 32.1 pg (ref 26.0–34.0)
MCHC: 33.8 g/dL (ref 32.0–36.0)
MCV: 94.9 fL (ref 80.0–100.0)
MONO ABS: 0.7 10*3/uL (ref 0.2–0.9)
MONOS PCT: 8 %
Neutro Abs: 6.7 10*3/uL — ABNORMAL HIGH (ref 1.4–6.5)
Neutrophils Relative %: 74 %
PLATELETS: 129 10*3/uL — AB (ref 150–440)
RBC: 3.39 MIL/uL — ABNORMAL LOW (ref 3.80–5.20)
RDW: 17.9 % — AB (ref 11.5–14.5)
WBC: 9 10*3/uL (ref 3.6–11.0)

## 2016-12-06 LAB — COMPREHENSIVE METABOLIC PANEL
ALK PHOS: 190 U/L — AB (ref 38–126)
ALT: 20 U/L (ref 14–54)
AST: 28 U/L (ref 15–41)
Albumin: 3.5 g/dL (ref 3.5–5.0)
Anion gap: 5 (ref 5–15)
BUN: 13 mg/dL (ref 6–20)
CO2: 26 mmol/L (ref 22–32)
CREATININE: 0.66 mg/dL (ref 0.44–1.00)
Calcium: 8.7 mg/dL — ABNORMAL LOW (ref 8.9–10.3)
Chloride: 106 mmol/L (ref 101–111)
GFR calc non Af Amer: >60 mL/min (ref >60)
Glucose, Bld: 101 mg/dL — ABNORMAL HIGH (ref 65–99)
Potassium: 3.9 mmol/L (ref 3.5–5.1)
Sodium: 137 mmol/L (ref 135–145)
Total Bilirubin: 0.5 mg/dL (ref 0.3–1.2)
Total Protein: 6.6 g/dL (ref 6.5–8.1)

## 2016-12-06 MED ORDER — ATROPINE SULFATE 1 MG/ML IJ SOLN
0.5000 mg | Freq: Once | INTRAMUSCULAR | Status: AC | PRN
  Administered 2016-12-06: 0.5 mg via INTRAVENOUS
  Filled 2016-12-06: qty 1

## 2016-12-06 MED ORDER — HEPARIN SOD (PORK) LOCK FLUSH 100 UNIT/ML IV SOLN: 500.0000 [IU] | Freq: Once | INTRAVENOUS | Status: DC | PRN

## 2016-12-06 MED ORDER — SODIUM CHLORIDE 0.9% FLUSH
10.0000 mL | INTRAVENOUS | Status: DC | PRN
  Administered 2016-12-06: 10 mL via INTRAVENOUS
  Filled 2016-12-06: qty 10

## 2016-12-06 MED ORDER — PALONOSETRON HCL INJECTION 0.25 MG/5ML
0.2500 mg | Freq: Once | INTRAVENOUS | Status: AC
  Administered 2016-12-06: 0.25 mg via INTRAVENOUS
  Filled 2016-12-06: qty 5

## 2016-12-06 MED ORDER — SODIUM CHLORIDE 0.9 % IV SOLN
2400.0000 mg/m2 | INTRAVENOUS | Status: DC
  Administered 2016-12-06: 4100 mg via INTRAVENOUS
  Filled 2016-12-06: qty 82

## 2016-12-06 MED ORDER — SODIUM CHLORIDE 0.9 % IV SOLN
Freq: Once | INTRAVENOUS | Status: AC
  Administered 2016-12-06: 10:00:00 via INTRAVENOUS
  Filled 2016-12-06: qty 1000

## 2016-12-06 MED ORDER — IRINOTECAN HCL CHEMO INJECTION 100 MG/5ML
180.0000 mg/m2 | Freq: Once | INTRAVENOUS | Status: AC
  Administered 2016-12-06: 300 mg via INTRAVENOUS
  Filled 2016-12-06: qty 15

## 2016-12-06 MED ORDER — LEUCOVORIN CALCIUM INJECTION 350 MG
700.0000 mg | Freq: Once | INTRAMUSCULAR | Status: AC
  Administered 2016-12-06: 700 mg via INTRAVENOUS
  Filled 2016-12-06: qty 35

## 2016-12-06 MED ORDER — HEPARIN SOD (PORK) LOCK FLUSH 100 UNIT/ML IV SOLN: 500.0000 [IU] | Freq: Once | INTRAVENOUS | Status: DC

## 2016-12-06 MED ORDER — FLUOROURACIL CHEMO INJECTION 2.5 GM/50ML
400.0000 mg/m2 | Freq: Once | INTRAVENOUS | Status: AC
  Administered 2016-12-06: 700 mg via INTRAVENOUS
  Filled 2016-12-06: qty 14

## 2016-12-06 MED ORDER — DEXAMETHASONE SODIUM PHOSPHATE 10 MG/ML IJ SOLN
10.0000 mg | Freq: Once | INTRAMUSCULAR | Status: AC
  Administered 2016-12-06: 10 mg via INTRAVENOUS
  Filled 2016-12-06: qty 1

## 2016-12-06 NOTE — Progress Notes (Signed)
Patient here today for follow up.  Patient states no new concerns today  

## 2016-12-08 ENCOUNTER — Inpatient Hospital Stay: Payer: BLUE CROSS/BLUE SHIELD

## 2016-12-08 VITALS — BP 97/62 | HR 73 | Temp 97.2°F | Resp 18

## 2016-12-08 DIAGNOSIS — C786 Secondary malignant neoplasm of retroperitoneum and peritoneum: Secondary | ICD-10-CM | POA: Diagnosis not present

## 2016-12-08 DIAGNOSIS — C801 Malignant (primary) neoplasm, unspecified: Principal | ICD-10-CM

## 2016-12-08 MED ORDER — SODIUM CHLORIDE 0.9% FLUSH
10.0000 mL | INTRAVENOUS | Status: DC | PRN
  Administered 2016-12-08: 10 mL
  Filled 2016-12-08: qty 10

## 2016-12-08 MED ORDER — HEPARIN SOD (PORK) LOCK FLUSH 100 UNIT/ML IV SOLN
500.0000 [IU] | Freq: Once | INTRAVENOUS | Status: AC | PRN
  Administered 2016-12-08: 500 [IU]
  Filled 2016-12-08: qty 5

## 2016-12-08 MED ORDER — PEGFILGRASTIM INJECTION 6 MG/0.6ML ~~LOC~~
6.0000 mg | PREFILLED_SYRINGE | Freq: Once | SUBCUTANEOUS | Status: AC
  Administered 2016-12-08: 6 mg via SUBCUTANEOUS
  Filled 2016-12-08: qty 0.6

## 2016-12-15 ENCOUNTER — Inpatient Hospital Stay: Payer: BLUE CROSS/BLUE SHIELD

## 2016-12-19 NOTE — Progress Notes (Signed)
[place]  Telephone:(336) [phone] Fax:(336) [phone]  ID: Valerie Horton OB: Jun 04, 1961  MR#: [id number]  CBJ#:[id number]  Patient Care Team: Valerie Roys, DO as PCP - General (Family Medicine) Clent Jacks, RN as Registered Nurse Garnetta Buddy, MD as Consulting Physician (Internal Medicine)  CHIEF COMPLAINT: Peritoneal carcinomatosis, metastatic adenocarcinoma of likely lower GI primary.  INTERVAL HISTORY: Patient returns to clinic today for further evaluation and consideration of cycle 11 of FOLFIRI. Her paracentesis requirement has significantly decreased, although she complains of more bloating this week. She denies any abdominal pain.  She has no neurological complaints. She denies any recent fevers or illnesses.  She has no chest pain or shortness of breath.  She denies and nausea, vomiting, constipation, or diarrhea. She has no melena or hematochezia. She has no urinary complains.  Patient offers no further specific complaints.  REVIEW OF SYSTEMS:   Review of Systems  Constitutional: Negative for fever, malaise/fatigue and weight loss.  Respiratory: Negative.  Negative for cough and shortness of breath.   Cardiovascular: Negative.  Negative for chest pain.  Gastrointestinal: Negative for abdominal pain, blood in stool, constipation, diarrhea, melena and vomiting.       Positive for abdominal bloating  Genitourinary: Negative.   Musculoskeletal: Negative.   Neurological: Positive for sensory change. Negative for weakness.  Psychiatric/Behavioral: The patient is not nervous/anxious.     As per HPI. Otherwise, a complete review of systems is negative.  PAST MEDICAL HISTORY: Past Medical History:  Diagnosis Date  . Acid reflux   . Allergic rhinitis   . Anxiety   . Arthritis    left knee  . Cancer ([place])   . Cervical spondylosis   . CTS (carpal tunnel syndrome)    Right  . Diverticulosis   . HSV-2 (herpes simplex virus 2) infection   .  Hyperlipidemia   . Ovarian failure   . Rosacea   . Wears contact lenses     PAST SURGICAL HISTORY: Past Surgical History:  Procedure Laterality Date  . ANTERIOR CRUCIATE LIGAMENT REPAIR Left   . CESAREAN SECTION    . COLONOSCOPY WITH PROPOFOL N/A 01/29/2016   Procedure: COLONOSCOPY WITH PROPOFOL;  Surgeon: Lucilla Lame, MD;  Location: [facility];  Service: Endoscopy;  Laterality: N/A;  . ESOPHAGOGASTRODUODENOSCOPY (EGD) WITH PROPOFOL N/A 01/29/2016   Procedure: ESOPHAGOGASTRODUODENOSCOPY (EGD) WITH PROPOFOL;  Surgeon: Lucilla Lame, MD;  Location: [facility];  Service: Endoscopy;  Laterality: N/A;  . PERIPHERAL VASCULAR CATHETERIZATION N/A 02/10/2016   Procedure: Glori Luis Cath Insertion;  Surgeon: Algernon Huxley, MD;  Location: [facility];  Service: Cardiovascular;  Laterality: N/A;    FAMILY HISTORY: Unknown.  Patient is adopted.     ADVANCED DIRECTIVES:    HEALTH MAINTENANCE: Social History  Substance Use Topics  . Smoking status: Former Research scientist (life sciences)  . Smokeless tobacco: Never Used     Comment: Quit in her 30's  . Alcohol use 0.0 oz/week     Comment: 1 drink/mo     Colonoscopy:  PAP:  Bone density:  Lipid panel:  No Known Allergies  Current Outpatient Prescriptions  Medication Sig Dispense Refill  . acyclovir (ZOVIRAX) 800 MG tablet Take 1 tablet (800 mg total) by mouth daily. 90 tablet 1  . escitalopram (LEXAPRO) 10 MG tablet TAKE 1 TABLET (10 MG TOTAL) BY MOUTH DAILY. 90 tablet 1  . lidocaine-prilocaine (EMLA) cream Apply 1 application topically as needed. 30 g 3  . ondansetron (ZOFRAN) 8 MG tablet  Take by mouth every 8 (eight) hours as needed for nausea or vomiting.    . pantoprazole (PROTONIX) 40 MG tablet Take 1 tablet (40 mg total) by mouth daily. 30 tablet 2  . prochlorperazine (COMPAZINE) 10 MG tablet Take 10 mg by mouth every 6 (six) hours as needed for nausea or vomiting.     No current facility-administered medications for this visit.     Facility-Administered Medications Ordered in Other Visits  Medication Dose Route Frequency Provider Last Rate Last Dose  . heparin lock flush 100 unit/mL  500 Units Intravenous Once Lloyd Huger, MD      . sodium chloride flush (NS) 0.9 % injection 10 mL  10 mL Intravenous PRN Lloyd Huger, MD   10 mL at 08/16/16 0925    OBJECTIVE: Vitals:   12/20/16 0923  BP: 115/78  Pulse: 88  Resp: 20  Temp: 97.5 F (36.4 C)     Body mass index is 24.47 kg/m.    ECOG FS:0 - Asymptomatic  General: Well-developed, well-nourished, no acute distress. Eyes: Pink conjunctiva, anicteric sclera. Lungs: Clear to auscultation bilaterally. Heart: Regular rate and rhythm. No rubs, murmurs, or gallops. Abdomen: Mildly distended, nontender.  Musculoskeletal: No edema, cyanosis, or clubbing. Neuro: Alert, answering all questions appropriately. Cranial nerves grossly intact. Skin: No rashes or petechiae noted. Psych: Normal affect.   LAB RESULTS:  Lab Results  Component Value Date   NA 139 12/20/2016   K 4.0 12/20/2016   CL 108 12/20/2016   CO2 25 12/20/2016   GLUCOSE 93 12/20/2016   BUN 14 12/20/2016   CREATININE 0.69 12/20/2016   CALCIUM 8.8 (L) 12/20/2016   PROT 6.7 12/20/2016   ALBUMIN 3.6 12/20/2016   AST 27 12/20/2016   ALT 17 12/20/2016   ALKPHOS 176 (H) 12/20/2016   BILITOT 0.4 12/20/2016   GFRNONAA >60 12/20/2016   GFRAA >60 12/20/2016    Lab Results  Component Value Date   WBC 8.7 12/20/2016   NEUTROABS 6.4 12/20/2016   HGB 11.1 (L) 12/20/2016   HCT 32.2 (L) 12/20/2016   MCV 92.4 12/20/2016   PLT 131 (L) 12/20/2016   Lab Results  Component Value Date   CA125 86.6 (H) 03/29/2016     STUDIES: US Abdomen Limited  Result Date: 11/23/2016 CLINICAL DATA:  Ascites and malignancy EXAM: LIMITED ABDOMEN ULTRASOUND FOR ASCITES TECHNIQUE: Limited ultrasound survey for ascites was performed in all four abdominal quadrants. COMPARISON:  None. FINDINGS: Imaging in  the abdomen demonstrates minimal ascites. Paracentesis was not performed. IMPRESSION: Imaging in the abdomen demonstrates minimal ascites. Paracentesis was not performed. Electronically Signed   By: Marybelle Killings M.D.   On: 11/23/2016 14:18    ASSESSMENT: Peritoneal carcinomatosis, metastatic adenocarcinoma of likely lower GI primary.  PLAN:    1. Peritoneal carcinomatosis: Pathology results reviewed, immunohistochemistry suggested lower GI primary. PET scan results reviewed independently and reported as above with what appears to be stable disease. Clinically she is also improving since she requires paracentesis less frequently. Her last paracentesis was on September 16, 2016. Of note, patient had a normal colonoscopy on October 16, 2014 in [place], [place]. Because of suspected progression of disease, patient's treatment was switched to FOLFIRI. Patient had consultation at [facility] who determined that no surgical or intraperitoneal chemotherapy would be beneficial. Proceed with cycle 11 of FOLFIRI today. Will not add Avastin since there seems to be no obvious benefit. Foundation 1 testing did not reveal any actionable mutations. Patient has an appointment  at [facility] for further evaluation in mid July. Return to clinic in 2 days for pump removal and Neulasta and then in 2 weeks for consideration of cycle 12. Patient will likely have reimaging studies at her appointment at [facility].  2: Genetic testing: Negative. 3. Anemia: Mild, monitor. 4. Thrombocytopenia: Proceed with treatment as above. 5. Neutropenia: Neulasta with the remainder treatments as above. 6. Peripheral neuropathy: Resolved. 7. Ascites: Appears slightly worse today. We will order ultrasound for later this week to see her centesis as needed.  Patient's last abdominal ultrasound on Nov 23, 2016 did not reveal enough fluid to safely remove. No paracentesis was performed. Her last paracentesis was on September 16, 2016.  Patient expressed understanding and was in agreement with this plan. She also understands that She can call clinic at any time with any questions, concerns, or complaints.     Lloyd Huger, MD 12/21/16 8:26 AM

## 2016-12-20 ENCOUNTER — Inpatient Hospital Stay: Payer: BLUE CROSS/BLUE SHIELD

## 2016-12-20 ENCOUNTER — Inpatient Hospital Stay (HOSPITAL_BASED_OUTPATIENT_CLINIC_OR_DEPARTMENT_OTHER): Payer: BLUE CROSS/BLUE SHIELD | Admitting: Oncology

## 2016-12-20 VITALS — BP 115/78 | HR 88 | Temp 97.5°F | Resp 20 | Wt 139.9 lb

## 2016-12-20 DIAGNOSIS — D701 Agranulocytosis secondary to cancer chemotherapy: Secondary | ICD-10-CM | POA: Diagnosis not present

## 2016-12-20 DIAGNOSIS — C801 Malignant (primary) neoplasm, unspecified: Principal | ICD-10-CM

## 2016-12-20 DIAGNOSIS — D649 Anemia, unspecified: Secondary | ICD-10-CM

## 2016-12-20 DIAGNOSIS — Z87891 Personal history of nicotine dependence: Secondary | ICD-10-CM

## 2016-12-20 DIAGNOSIS — D696 Thrombocytopenia, unspecified: Secondary | ICD-10-CM

## 2016-12-20 DIAGNOSIS — K219 Gastro-esophageal reflux disease without esophagitis: Secondary | ICD-10-CM | POA: Diagnosis not present

## 2016-12-20 DIAGNOSIS — R188 Other ascites: Secondary | ICD-10-CM

## 2016-12-20 DIAGNOSIS — Z79899 Other long term (current) drug therapy: Secondary | ICD-10-CM

## 2016-12-20 DIAGNOSIS — C786 Secondary malignant neoplasm of retroperitoneum and peritoneum: Secondary | ICD-10-CM | POA: Diagnosis not present

## 2016-12-20 DIAGNOSIS — T451X5S Adverse effect of antineoplastic and immunosuppressive drugs, sequela: Secondary | ICD-10-CM

## 2016-12-20 DIAGNOSIS — E785 Hyperlipidemia, unspecified: Secondary | ICD-10-CM | POA: Diagnosis not present

## 2016-12-20 LAB — CBC WITH DIFFERENTIAL/PLATELET
BASOS ABS: 0 10*3/uL (ref 0–0.1)
Basophils Relative: 0 %
EOS PCT: 1 %
Eosinophils Absolute: 0.1 10*3/uL (ref 0–0.7)
HCT: 32.2 % — ABNORMAL LOW (ref 35.0–47.0)
Hemoglobin: 11.1 g/dL — ABNORMAL LOW (ref 12.0–16.0)
LYMPHS ABS: 1.6 10*3/uL (ref 1.0–3.6)
Lymphocytes Relative: 19 %
MCH: 31.7 pg (ref 26.0–34.0)
MCHC: 34.3 g/dL (ref 32.0–36.0)
MCV: 92.4 fL (ref 80.0–100.0)
MONO ABS: 0.6 10*3/uL (ref 0.2–0.9)
MONOS PCT: 7 %
Neutro Abs: 6.4 10*3/uL (ref 1.4–6.5)
Neutrophils Relative %: 73 %
PLATELETS: 131 10*3/uL — AB (ref 150–440)
RBC: 3.49 MIL/uL — ABNORMAL LOW (ref 3.80–5.20)
RDW: 18 % — AB (ref 11.5–14.5)
WBC: 8.7 10*3/uL (ref 3.6–11.0)

## 2016-12-20 LAB — COMPREHENSIVE METABOLIC PANEL
ALT: 17 U/L (ref 14–54)
ANION GAP: 6 (ref 5–15)
AST: 27 U/L (ref 15–41)
Albumin: 3.6 g/dL (ref 3.5–5.0)
Alkaline Phosphatase: 176 U/L — ABNORMAL HIGH (ref 38–126)
BILIRUBIN TOTAL: 0.4 mg/dL (ref 0.3–1.2)
BUN: 14 mg/dL (ref 6–20)
CHLORIDE: 108 mmol/L (ref 101–111)
CO2: 25 mmol/L (ref 22–32)
Calcium: 8.8 mg/dL — ABNORMAL LOW (ref 8.9–10.3)
Creatinine, Ser: 0.69 mg/dL (ref 0.44–1.00)
Glucose, Bld: 93 mg/dL (ref 65–99)
POTASSIUM: 4 mmol/L (ref 3.5–5.1)
Sodium: 139 mmol/L (ref 135–145)
TOTAL PROTEIN: 6.7 g/dL (ref 6.5–8.1)

## 2016-12-20 MED ORDER — SODIUM CHLORIDE 0.9% FLUSH
10.0000 mL | INTRAVENOUS | Status: DC | PRN
Start: 2016-12-20 — End: 2016-12-20
  Administered 2016-12-20: 10 mL
  Filled 2016-12-20: qty 10

## 2016-12-20 MED ORDER — IRINOTECAN HCL CHEMO INJECTION 100 MG/5ML
180.0000 mg/m2 | Freq: Once | INTRAVENOUS | Status: AC
  Administered 2016-12-20: 300 mg via INTRAVENOUS
  Filled 2016-12-20: qty 15

## 2016-12-20 MED ORDER — SODIUM CHLORIDE 0.9 % IV SOLN
2400.0000 mg/m2 | INTRAVENOUS | Status: DC
  Administered 2016-12-20: 4100 mg via INTRAVENOUS
  Filled 2016-12-20: qty 82

## 2016-12-20 MED ORDER — PALONOSETRON HCL INJECTION 0.25 MG/5ML
0.2500 mg | Freq: Once | INTRAVENOUS | Status: AC
  Administered 2016-12-20: 0.25 mg via INTRAVENOUS
  Filled 2016-12-20: qty 5

## 2016-12-20 MED ORDER — FLUOROURACIL CHEMO INJECTION 2.5 GM/50ML
400.0000 mg/m2 | Freq: Once | INTRAVENOUS | Status: AC
  Administered 2016-12-20: 700 mg via INTRAVENOUS
  Filled 2016-12-20: qty 14

## 2016-12-20 MED ORDER — SODIUM CHLORIDE 0.9 % IV SOLN
Freq: Once | INTRAVENOUS | Status: AC
  Administered 2016-12-20: 10:00:00 via INTRAVENOUS
  Filled 2016-12-20: qty 1000

## 2016-12-20 MED ORDER — LEUCOVORIN CALCIUM INJECTION 350 MG
700.0000 mg | Freq: Once | INTRAVENOUS | Status: AC
  Administered 2016-12-20: 700 mg via INTRAVENOUS
  Filled 2016-12-20: qty 35

## 2016-12-20 MED ORDER — DEXAMETHASONE SODIUM PHOSPHATE 10 MG/ML IJ SOLN
10.0000 mg | Freq: Once | INTRAMUSCULAR | Status: AC
  Administered 2016-12-20: 10 mg via INTRAVENOUS
  Filled 2016-12-20: qty 1

## 2016-12-20 MED ORDER — ATROPINE SULFATE 1 MG/ML IJ SOLN
0.5000 mg | Freq: Once | INTRAMUSCULAR | Status: AC | PRN
  Administered 2016-12-20: 0.5 mg via INTRAVENOUS
  Filled 2016-12-20: qty 1

## 2016-12-20 NOTE — Progress Notes (Signed)
Patient reports increase in abdominal fluid, traveling to [facility] on July 9th.

## 2016-12-20 NOTE — Discharge Instructions (Signed)
Paracentesis, Care After °Refer to this sheet in the next few weeks. These instructions provide you with information about caring for yourself after your procedure. Your health care provider may also give you more specific instructions. Your treatment has been planned according to current medical practices, but problems sometimes occur. Call your health care provider if you have any problems or questions after your procedure. °What can I expect after the procedure? °After your procedure, it is common to have a small amount of clear fluid coming from the puncture site. °Follow these instructions at home: °· Return to your normal activities as told by your health care provider. Ask your health care provider what activities are safe for you. °· Take over-the-counter and prescription medicines only as told by your health care provider. °· Do not take baths, swim, or use a hot tub until your health care provider approves. °· Follow instructions from your health care provider about: °? How to take care of your puncture site. °? When and how you should change your bandage (dressing). °? When you should remove your dressing. °· Check your puncture area every day signs of infection. Watch for: °? Redness, swelling, or pain. °? Fluid, blood, or pus. °· Keep all follow-up visits as told by your health care provider. This is important. °Contact a health care provider if: °· You have redness, swelling, or pain at your puncture site. °· You start to have more clear fluid coming from your puncture site. °· You have blood or pus coming from your puncture site. °· You have chills. °· You have a fever. °Get help right away if: °· You develop chest pain or shortness of breath. °· You develop increasing pain, discomfort, or swelling in your abdomen. °· You feel dizzy or light-headed or you pass out. °This information is not intended to replace advice given to you by your health care provider. Make sure you discuss any questions you  have with your health care provider. °Document Released: 11/04/2014 Document Revised: 11/26/2015 Document Reviewed: 09/02/2014 °Elsevier Interactive Patient Education © 2018 [employer] ° °

## 2016-12-22 ENCOUNTER — Inpatient Hospital Stay: Payer: BLUE CROSS/BLUE SHIELD

## 2016-12-22 VITALS — BP 97/60 | HR 96 | Temp 97.0°F | Resp 20

## 2016-12-22 DIAGNOSIS — C786 Secondary malignant neoplasm of retroperitoneum and peritoneum: Secondary | ICD-10-CM

## 2016-12-22 DIAGNOSIS — C801 Malignant (primary) neoplasm, unspecified: Principal | ICD-10-CM

## 2016-12-22 MED ORDER — SODIUM CHLORIDE 0.9% FLUSH
10.0000 mL | INTRAVENOUS | Status: DC | PRN
  Administered 2016-12-22: 10 mL
  Filled 2016-12-22: qty 10

## 2016-12-22 MED ORDER — HEPARIN SOD (PORK) LOCK FLUSH 100 UNIT/ML IV SOLN
500.0000 [IU] | Freq: Once | INTRAVENOUS | Status: AC | PRN
  Administered 2016-12-22: 500 [IU]
  Filled 2016-12-22: qty 5

## 2016-12-22 MED ORDER — PEGFILGRASTIM INJECTION 6 MG/0.6ML ~~LOC~~
6.0000 mg | PREFILLED_SYRINGE | Freq: Once | SUBCUTANEOUS | Status: AC
  Administered 2016-12-22: 6 mg via SUBCUTANEOUS
  Filled 2016-12-22: qty 0.6

## 2016-12-23 ENCOUNTER — Ambulatory Visit
Admission: RE | Admit: 2016-12-23 | Discharge: 2016-12-23 | Disposition: A | Discharge status: Home / Self Care | Payer: BLUE CROSS/BLUE SHIELD | Source: Ambulatory Visit | Attending: Oncology | Admitting: Oncology

## 2016-12-23 DIAGNOSIS — C801 Malignant (primary) neoplasm, unspecified: Secondary | ICD-10-CM | POA: Diagnosis present

## 2016-12-23 DIAGNOSIS — C786 Secondary malignant neoplasm of retroperitoneum and peritoneum: Secondary | ICD-10-CM

## 2016-12-23 DIAGNOSIS — R188 Other ascites: Secondary | ICD-10-CM | POA: Insufficient documentation

## 2016-12-23 NOTE — Procedures (Signed)
Paracentesis RLQ EBL 0 Comp 0

## 2016-12-26 ENCOUNTER — Other Ambulatory Visit: Payer: Self-pay | Admitting: *Deleted

## 2016-12-26 DIAGNOSIS — C786 Secondary malignant neoplasm of retroperitoneum and peritoneum: Secondary | ICD-10-CM

## 2016-12-26 DIAGNOSIS — Z7189 Other specified counseling: Secondary | ICD-10-CM

## 2016-12-26 DIAGNOSIS — C801 Malignant (primary) neoplasm, unspecified: Secondary | ICD-10-CM

## 2016-12-26 MED ORDER — PANTOPRAZOLE SODIUM 40 MG PO TBEC: 40.0000 mg | DELAYED_RELEASE_TABLET | Freq: Every day | ORAL | 2 refills | Status: DC

## 2016-12-28 ENCOUNTER — Telehealth: Payer: Self-pay | Admitting: *Deleted

## 2016-12-28 ENCOUNTER — Other Ambulatory Visit: Payer: Self-pay | Admitting: *Deleted

## 2016-12-28 DIAGNOSIS — Z01812 Encounter for preprocedural laboratory examination: Secondary | ICD-10-CM

## 2016-12-28 NOTE — Telephone Encounter (Signed)
Per Dr Grayland Ormond have her come in for platelet check and if OK may proceed to see dentist. I advised patient of this and she has not gotten her appt yet, she will arrange appt and call back for lab appt

## 2016-12-28 NOTE — Telephone Encounter (Signed)
Asking if she can go to the dentist, she has broken a tooth and also needs a cleaning if alright. Her last treatment was on 6/19, next one scheduled for 7/3 Please advise.

## 2016-12-30 ENCOUNTER — Inpatient Hospital Stay: Payer: BLUE CROSS/BLUE SHIELD

## 2016-12-30 DIAGNOSIS — C786 Secondary malignant neoplasm of retroperitoneum and peritoneum: Secondary | ICD-10-CM | POA: Diagnosis not present

## 2016-12-30 DIAGNOSIS — Z01812 Encounter for preprocedural laboratory examination: Secondary | ICD-10-CM

## 2016-12-30 LAB — CBC WITH DIFFERENTIAL/PLATELET
BASOS ABS: 0.1 10*3/uL (ref 0–0.1)
BASOS PCT: 0 %
EOS PCT: 0 %
Eosinophils Absolute: 0 10*3/uL (ref 0–0.7)
HCT: 35.1 % (ref 35.0–47.0)
Hemoglobin: 11.7 g/dL — ABNORMAL LOW (ref 12.0–16.0)
Lymphocytes Relative: 17 %
Lymphs Abs: 2.1 10*3/uL (ref 1.0–3.6)
MCH: 31.3 pg (ref 26.0–34.0)
MCHC: 33.3 g/dL (ref 32.0–36.0)
MCV: 93.8 fL (ref 80.0–100.0)
MONO ABS: 0.7 10*3/uL (ref 0.2–0.9)
Monocytes Relative: 6 %
Neutro Abs: 9.2 10*3/uL — ABNORMAL HIGH (ref 1.4–6.5)
Neutrophils Relative %: 77 %
PLATELETS: 98 10*3/uL — AB (ref 150–440)
RBC: 3.74 MIL/uL — ABNORMAL LOW (ref 3.80–5.20)
RDW: 18.1 % — AB (ref 11.5–14.5)
WBC: 12.1 10*3/uL — ABNORMAL HIGH (ref 3.6–11.0)

## 2017-01-02 NOTE — Progress Notes (Signed)
[facility]  [phone] [phone]) [phone]  ID: Valerie Horton OB: 22-Oct-1960  MR#: [id number]  JAS#:[id number]  Patient Care Team: Valerie Roys, DO as PCP - General (Family Medicine) Clent Jacks, RN as Registered Nurse Valerie Buddy, MD as Consulting Physician (Internal Medicine)  CHIEF COMPLAINT: Peritoneal carcinomatosis, metastatic adenocarcinoma of likely lower GI primary.  INTERVAL HISTORY: Patient returns to clinic today for further evaluation and consideration of cycle 12 of FOLFIRI. Patient had a paracentesis approximately 2 weeks ago to remove 700 mL of fluid. She continues to have mild bloating but denies any pain.  She has no neurological complaints. She denies any recent fevers or illnesses.  She has no chest pain or shortness of breath.  She denies and nausea, vomiting, constipation, or diarrhea. She has no melena or hematochezia. She has no urinary complains.  Patient offers no further specific complaints.  REVIEW OF SYSTEMS:   Review of Systems  Constitutional: Negative for fever, malaise/fatigue and weight loss.  Respiratory: Negative.  Negative for cough and shortness of breath.   Cardiovascular: Negative.  Negative for chest pain.  Gastrointestinal: Negative for abdominal pain, blood in stool, constipation, diarrhea, melena and vomiting.       Positive for abdominal bloating  Genitourinary: Negative.   Musculoskeletal: Negative.   Neurological: Positive for sensory change. Negative for weakness.  Psychiatric/Behavioral: The patient is not nervous/anxious.     As per HPI. Otherwise, a complete review of systems is negative.  PAST MEDICAL HISTORY: Past Medical History:  Diagnosis Date  . Acid reflux   . Allergic rhinitis   . Anxiety   . Arthritis    left knee  . Cancer ([facility])   . Cervical spondylosis   . CTS (carpal tunnel syndrome)    Right  . Diverticulosis   . HSV-2 (herpes simplex virus 2) infection   .  Hyperlipidemia   . Ovarian failure   . Rosacea   . Wears contact lenses     PAST SURGICAL HISTORY: Past Surgical History:  Procedure Laterality Date  . ANTERIOR CRUCIATE LIGAMENT REPAIR Left   . CESAREAN SECTION    . COLONOSCOPY WITH PROPOFOL N/A 01/29/2016   Procedure: COLONOSCOPY WITH PROPOFOL;  Surgeon: Valerie Lame, MD;  Location: [facility];  Service: Endoscopy;  Laterality: N/A;  . ESOPHAGOGASTRODUODENOSCOPY (EGD) WITH PROPOFOL N/A 01/29/2016   Procedure: ESOPHAGOGASTRODUODENOSCOPY (EGD) WITH PROPOFOL;  Surgeon: Valerie Lame, MD;  Location: [facility];  Service: Endoscopy;  Laterality: N/A;  . PERIPHERAL VASCULAR CATHETERIZATION N/A 02/10/2016   Procedure: [facility] Cath Insertion;  Surgeon: Valerie Huxley, MD;  Location: [facility];  Service: Cardiovascular;  Laterality: N/A;    FAMILY HISTORY: Unknown.  Patient is adopted.     ADVANCED DIRECTIVES:    HEALTH MAINTENANCE: Social History  Substance Use Topics  . Smoking status: Former Research scientist (life sciences)  . Smokeless tobacco: Never Used     Comment: Quit in her 8's  . Alcohol use 0.0 oz/week     Comment: 1 drink/mo     Colonoscopy:  PAP:  Bone density:  Lipid panel:  No Known Allergies  Current Outpatient Prescriptions  Medication Sig Dispense Refill  . acyclovir (ZOVIRAX) 800 MG tablet Take 1 tablet (800 mg total) by mouth daily. 90 tablet 1  . escitalopram (LEXAPRO) 10 MG tablet TAKE 1 TABLET (10 MG TOTAL) BY MOUTH DAILY. 90 tablet 1  . lidocaine-prilocaine (EMLA) cream Apply 1 application topically as needed. 30 g 3  .  ondansetron (ZOFRAN) 8 MG tablet Take by mouth every 8 (eight) hours as needed for nausea or vomiting.    . pantoprazole (PROTONIX) 40 MG tablet Take 1 tablet (40 mg total) by mouth daily. 30 tablet 2  . prochlorperazine (COMPAZINE) 10 MG tablet Take 10 mg by mouth every 6 (six) hours as needed for nausea or vomiting.     No current facility-administered medications for this visit.     Facility-Administered Medications Ordered in Other Visits  Medication Dose Route Frequency Provider Last Rate Last Dose  . fluorouracil (ADRUCIL) 4,100 mg in sodium chloride 0.9 % 68 mL chemo infusion  2,400 mg/m2 (Treatment Plan Recorded) Intravenous 1 day or 1 dose Valerie Huger, MD      . fluorouracil (ADRUCIL) chemo injection 700 mg  400 mg/m2 (Treatment Plan Recorded) Intravenous Once Valerie Huger, MD      . heparin lock flush 100 unit/mL  500 Units Intravenous Once Valerie Huger, MD      . heparin lock flush 100 unit/mL  500 Units Intravenous Once Valerie Huger, MD      . irinotecan (CAMPTOSAR) 300 mg in dextrose 5 % 500 mL chemo infusion  180 mg/m2 (Treatment Plan Recorded) Intravenous Once Valerie Huger, MD 343 mL/hr at 01/03/17 1120 300 mg at 01/03/17 1120  . leucovorin 700 mg in dextrose 5 % 250 mL infusion  700 mg Intravenous Once Valerie Huger, MD 190 mL/hr at 01/03/17 1120 700 mg at 01/03/17 1120  . sodium chloride flush (NS) 0.9 % injection 10 mL  10 mL Intravenous PRN Valerie Huger, MD   10 mL at 08/16/16 0925  . sodium chloride flush (NS) 0.9 % injection 10 mL  10 mL Intravenous PRN Valerie Huger, MD   10 mL at 01/03/17 0915    OBJECTIVE: Vitals:   01/03/17 0950  BP: 115/72  Pulse: 81  Resp: 20  Temp: 97.8 F (36.6 C)     Body mass index is 24.31 kg/m.    ECOG FS:0 - Asymptomatic  General: Well-developed, well-nourished, no acute distress. Eyes: Pink conjunctiva, anicteric sclera. Lungs: Clear to auscultation bilaterally. Heart: Regular rate and rhythm. No rubs, murmurs, or gallops. Abdomen: Mildly distended, nontender.  Musculoskeletal: No edema, cyanosis, or clubbing. Neuro: Alert, answering all questions appropriately. Cranial nerves grossly intact. Skin: No rashes or petechiae noted. Psych: Normal affect.   LAB RESULTS:  Lab Results  Component Value Date   NA 137 01/03/2017   K 4.0 01/03/2017   CL 107  01/03/2017   CO2 25 01/03/2017   GLUCOSE 94 01/03/2017   BUN 14 01/03/2017   CREATININE 0.73 01/03/2017   CALCIUM 8.8 (L) 01/03/2017   PROT 6.6 01/03/2017   ALBUMIN 3.8 01/03/2017   AST 25 01/03/2017   ALT 17 01/03/2017   ALKPHOS 200 (H) 01/03/2017   BILITOT 0.4 01/03/2017   GFRNONAA >60 01/03/2017   GFRAA >60 01/03/2017    Lab Results  Component Value Date   WBC 8.5 01/03/2017   NEUTROABS 6.3 01/03/2017   HGB 11.3 (L) 01/03/2017   HCT 33.1 (L) 01/03/2017   MCV 92.1 01/03/2017   PLT 132 (L) 01/03/2017   Lab Results  Component Value Date   CA125 86.6 (H) 03/29/2016     STUDIES: US Paracentesis  Result Date: 12/23/2016 INDICATION: Ascites EXAM: ULTRASOUND GUIDED  PARACENTESIS MEDICATIONS: None. COMPLICATIONS: None immediate. PROCEDURE: Informed written consent was obtained from the patient after a discussion of the risks, benefits and  alternatives to treatment. A timeout was performed prior to the initiation of the procedure. Initial ultrasound scanning demonstrates a large amount of ascites within the right lower abdominal quadrant. The right lower abdomen was prepped and draped in the usual sterile fashion. 1% lidocaine with epinephrine was used for local anesthesia. Following this, a Safe-T-Centesis catheter was introduced. An ultrasound image was saved for documentation purposes. The paracentesis was performed. The catheter was removed and a dressing was applied. The patient tolerated the procedure well without immediate post procedural complication. FINDINGS: A total of approximately 700 cc of clear yellow fluid was removed. IMPRESSION: Successful ultrasound-guided paracentesis yielding 0.7 liters of peritoneal fluid. Electronically Signed   By: Marybelle Killings M.D.   On: 12/23/2016 15:17    ASSESSMENT: Peritoneal carcinomatosis, metastatic adenocarcinoma of likely lower GI primary.  PLAN:    1. Peritoneal carcinomatosis: Pathology results reviewed, immunohistochemistry  suggested lower GI primary. PET scan results from October 20, 2016 reviewed independently with what appears to be stable disease. Clinically she is also improving since she requires paracentesis less frequently. Her last paracentesis was on December 23, 2016. Of note, patient had a normal colonoscopy on October 16, 2014 in [place], [place]. Because of suspected progression of disease, patient's treatment was switched to FOLFIRI. Patient had consultation at [facility] who determined that no surgical or intraperitoneal chemotherapy would be beneficial. Proceed with cycle 12 of FOLFIRI today. Will not add Avastin since there seems to be no obvious benefit. Foundation 1 testing did not reveal any actionable mutations. Patient has an appointment at [facility] next week for further evaluation. Return to clinic in 2 days for pump removal and Neulasta and then in 2 weeks for continued follow-up. Patient will likely have reimaging studies at her appointment at [facility].  2: Genetic testing: Negative. 3. Anemia: Mild, monitor. 4. Thrombocytopenia: Proceed with treatment as above. 5. Neutropenia: Neulasta with the remainder treatments as above. 6. Peripheral neuropathy: Resolved. 7. Ascites: Patient had a paracentesis on December 23, 2016 which removed 700 mL of fluid. Her last paracentesis prior to this was on September 16, 2016 when 3.5 L of fluid was removed.  Patient expressed understanding and was in agreement with this plan. She also understands that She can call clinic at any time with any questions, concerns, or complaints.     Valerie Huger, MD 01/03/17 12:29 PM

## 2017-01-03 ENCOUNTER — Inpatient Hospital Stay: Payer: BLUE CROSS/BLUE SHIELD | Attending: Oncology

## 2017-01-03 ENCOUNTER — Inpatient Hospital Stay: Payer: BLUE CROSS/BLUE SHIELD

## 2017-01-03 ENCOUNTER — Inpatient Hospital Stay (HOSPITAL_BASED_OUTPATIENT_CLINIC_OR_DEPARTMENT_OTHER): Payer: BLUE CROSS/BLUE SHIELD | Admitting: Oncology

## 2017-01-03 VITALS — BP 115/72 | HR 81 | Temp 97.8°F | Resp 20 | Wt 139.0 lb

## 2017-01-03 DIAGNOSIS — D649 Anemia, unspecified: Secondary | ICD-10-CM | POA: Insufficient documentation

## 2017-01-03 DIAGNOSIS — T451X5S Adverse effect of antineoplastic and immunosuppressive drugs, sequela: Secondary | ICD-10-CM | POA: Diagnosis not present

## 2017-01-03 DIAGNOSIS — C786 Secondary malignant neoplasm of retroperitoneum and peritoneum: Secondary | ICD-10-CM | POA: Diagnosis not present

## 2017-01-03 DIAGNOSIS — L719 Rosacea, unspecified: Secondary | ICD-10-CM | POA: Diagnosis not present

## 2017-01-03 DIAGNOSIS — R188 Other ascites: Secondary | ICD-10-CM

## 2017-01-03 DIAGNOSIS — E785 Hyperlipidemia, unspecified: Secondary | ICD-10-CM

## 2017-01-03 DIAGNOSIS — K219 Gastro-esophageal reflux disease without esophagitis: Secondary | ICD-10-CM | POA: Diagnosis not present

## 2017-01-03 DIAGNOSIS — D701 Agranulocytosis secondary to cancer chemotherapy: Secondary | ICD-10-CM | POA: Diagnosis not present

## 2017-01-03 DIAGNOSIS — Z5111 Encounter for antineoplastic chemotherapy: Secondary | ICD-10-CM | POA: Insufficient documentation

## 2017-01-03 DIAGNOSIS — Z79899 Other long term (current) drug therapy: Secondary | ICD-10-CM | POA: Diagnosis not present

## 2017-01-03 DIAGNOSIS — Z87891 Personal history of nicotine dependence: Secondary | ICD-10-CM

## 2017-01-03 DIAGNOSIS — C801 Malignant (primary) neoplasm, unspecified: Secondary | ICD-10-CM | POA: Diagnosis not present

## 2017-01-03 LAB — CBC WITH DIFFERENTIAL/PLATELET
BASOS ABS: 0 10*3/uL (ref 0–0.1)
Basophils Relative: 1 %
EOS ABS: 0.1 10*3/uL (ref 0–0.7)
Eosinophils Relative: 1 %
HCT: 33.1 % — ABNORMAL LOW (ref 35.0–47.0)
HEMOGLOBIN: 11.3 g/dL — AB (ref 12.0–16.0)
LYMPHS ABS: 1.4 10*3/uL (ref 1.0–3.6)
Lymphocytes Relative: 17 %
MCH: 31.4 pg (ref 26.0–34.0)
MCHC: 34.1 g/dL (ref 32.0–36.0)
MCV: 92.1 fL (ref 80.0–100.0)
Monocytes Absolute: 0.6 10*3/uL (ref 0.2–0.9)
Monocytes Relative: 7 %
NEUTROS PCT: 74 %
Neutro Abs: 6.3 10*3/uL (ref 1.4–6.5)
Platelets: 132 10*3/uL — ABNORMAL LOW (ref 150–440)
RBC: 3.6 MIL/uL — AB (ref 3.80–5.20)
RDW: 17.8 % — ABNORMAL HIGH (ref 11.5–14.5)
WBC: 8.5 10*3/uL (ref 3.6–11.0)

## 2017-01-03 LAB — COMPREHENSIVE METABOLIC PANEL
ALBUMIN: 3.8 g/dL (ref 3.5–5.0)
ALK PHOS: 200 U/L — AB (ref 38–126)
ALT: 17 U/L (ref 14–54)
AST: 25 U/L (ref 15–41)
Anion gap: 5 (ref 5–15)
BUN: 14 mg/dL (ref 6–20)
CALCIUM: 8.8 mg/dL — AB (ref 8.9–10.3)
CHLORIDE: 107 mmol/L (ref 101–111)
CO2: 25 mmol/L (ref 22–32)
CREATININE: 0.73 mg/dL (ref 0.44–1.00)
GFR calc Af Amer: >60 mL/min (ref >60)
GFR calc non Af Amer: >60 mL/min (ref >60)
Glucose, Bld: 94 mg/dL (ref 65–99)
Potassium: 4 mmol/L (ref 3.5–5.1)
SODIUM: 137 mmol/L (ref 135–145)
Total Bilirubin: 0.4 mg/dL (ref 0.3–1.2)
Total Protein: 6.6 g/dL (ref 6.5–8.1)

## 2017-01-03 MED ORDER — LEUCOVORIN CALCIUM INJECTION 350 MG
700.0000 mg | Freq: Once | INTRAVENOUS | Status: AC
  Administered 2017-01-03: 700 mg via INTRAVENOUS
  Filled 2017-01-03: qty 35

## 2017-01-03 MED ORDER — IRINOTECAN HCL CHEMO INJECTION 100 MG/5ML
180.0000 mg/m2 | Freq: Once | INTRAVENOUS | Status: AC
  Administered 2017-01-03: 300 mg via INTRAVENOUS
  Filled 2017-01-03: qty 15

## 2017-01-03 MED ORDER — SODIUM CHLORIDE 0.9 % IV SOLN
Freq: Once | INTRAVENOUS | Status: AC
  Administered 2017-01-03: 11:00:00 via INTRAVENOUS
  Filled 2017-01-03: qty 1000

## 2017-01-03 MED ORDER — DEXAMETHASONE SODIUM PHOSPHATE 10 MG/ML IJ SOLN
10.0000 mg | Freq: Once | INTRAMUSCULAR | Status: AC
  Administered 2017-01-03: 10 mg via INTRAVENOUS

## 2017-01-03 MED ORDER — HEPARIN SOD (PORK) LOCK FLUSH 100 UNIT/ML IV SOLN: 500.0000 [IU] | Freq: Once | INTRAVENOUS | Status: DC

## 2017-01-03 MED ORDER — SODIUM CHLORIDE 0.9 % IV SOLN
2400.0000 mg/m2 | INTRAVENOUS | Status: DC
  Administered 2017-01-03: 4100 mg via INTRAVENOUS
  Filled 2017-01-03: qty 82

## 2017-01-03 MED ORDER — PALONOSETRON HCL INJECTION 0.25 MG/5ML
0.2500 mg | Freq: Once | INTRAVENOUS | Status: AC
  Administered 2017-01-03: 0.25 mg via INTRAVENOUS

## 2017-01-03 MED ORDER — SODIUM CHLORIDE 0.9% FLUSH
10.0000 mL | INTRAVENOUS | Status: DC | PRN
  Administered 2017-01-03: 10 mL via INTRAVENOUS
  Filled 2017-01-03: qty 10

## 2017-01-03 MED ORDER — FLUOROURACIL CHEMO INJECTION 2.5 GM/50ML
400.0000 mg/m2 | Freq: Once | INTRAVENOUS | Status: AC
  Administered 2017-01-03: 700 mg via INTRAVENOUS
  Filled 2017-01-03: qty 14

## 2017-01-03 MED ORDER — ATROPINE SULFATE 1 MG/ML IJ SOLN
0.5000 mg | Freq: Once | INTRAMUSCULAR | Status: AC | PRN
  Administered 2017-01-03: 0.5 mg via INTRAVENOUS

## 2017-01-03 NOTE — Progress Notes (Signed)
Patient denies any concerns today.  

## 2017-01-05 ENCOUNTER — Inpatient Hospital Stay: Payer: BLUE CROSS/BLUE SHIELD

## 2017-01-05 VITALS — BP 101/49 | HR 75 | Temp 97.1°F | Resp 18

## 2017-01-05 DIAGNOSIS — C801 Malignant (primary) neoplasm, unspecified: Principal | ICD-10-CM

## 2017-01-05 DIAGNOSIS — C786 Secondary malignant neoplasm of retroperitoneum and peritoneum: Secondary | ICD-10-CM

## 2017-01-05 MED ORDER — PEGFILGRASTIM INJECTION 6 MG/0.6ML ~~LOC~~
6.0000 mg | PREFILLED_SYRINGE | Freq: Once | SUBCUTANEOUS | Status: AC
  Administered 2017-01-05: 6 mg via SUBCUTANEOUS

## 2017-01-05 MED ORDER — HEPARIN SOD (PORK) LOCK FLUSH 100 UNIT/ML IV SOLN
500.0000 [IU] | Freq: Once | INTRAVENOUS | Status: AC | PRN
  Administered 2017-01-05: 500 [IU]

## 2017-01-05 MED ORDER — SODIUM CHLORIDE 0.9% FLUSH
10.0000 mL | INTRAVENOUS | Status: DC | PRN
  Administered 2017-01-05: 10 mL
  Filled 2017-01-05: qty 10

## 2017-01-15 NOTE — Progress Notes (Signed)
[facility]  Telephone:(336) [phone] Fax:(336) [phone]  ID: KRYSTLE POLCYN OB: 1961-03-04  MR#: [id number]  EXH#:[id number]  Patient Care Team: Valerie Roys, DO as PCP - General (Family Medicine) Clent Jacks, RN as Registered Nurse Garnetta Buddy, MD as Consulting Physician (Internal Medicine)  CHIEF COMPLAINT: Peritoneal carcinomatosis, metastatic adenocarcinoma of likely lower GI primary.  INTERVAL HISTORY: Patient returns to clinic today for further evaluation. She recently had consultation at [facility] who recommended adding Avastin to her treatment. She continues to have mild bloating but denies any pain.  She has no neurological complaints. She denies any recent fevers or illnesses.  She has no chest pain or shortness of breath.  She denies and nausea, vomiting, constipation, or diarrhea. She has no melena or hematochezia. She has no urinary complains.  Patient offers no further specific complaints.  REVIEW OF SYSTEMS:   Review of Systems  Constitutional: Negative for fever, malaise/fatigue and weight loss.  Respiratory: Negative.  Negative for cough and shortness of breath.   Cardiovascular: Negative.  Negative for chest pain.  Gastrointestinal: Negative for abdominal pain, blood in stool, constipation, diarrhea, melena and vomiting.       Positive for abdominal bloating  Genitourinary: Negative.   Musculoskeletal: Negative.   Neurological: Positive for sensory change. Negative for weakness.  Psychiatric/Behavioral: The patient is not nervous/anxious.     As per HPI. Otherwise, a complete review of systems is negative.  PAST MEDICAL HISTORY: Past Medical History:  Diagnosis Date  . Acid reflux   . Allergic rhinitis   . Anxiety   . Arthritis    left knee  . Cancer ([place])   . Cervical spondylosis   . CTS (carpal tunnel syndrome)    Right  . Diverticulosis   . HSV-2 (herpes simplex virus 2) infection   . Hyperlipidemia   . Ovarian  failure   . Rosacea   . Wears contact lenses     PAST SURGICAL HISTORY: Past Surgical History:  Procedure Laterality Date  . ANTERIOR CRUCIATE LIGAMENT REPAIR Left   . CESAREAN SECTION    . COLONOSCOPY WITH PROPOFOL N/A 01/29/2016   Procedure: COLONOSCOPY WITH PROPOFOL;  Surgeon: Lucilla Lame, MD;  Location: [facility];  Service: Endoscopy;  Laterality: N/A;  . ESOPHAGOGASTRODUODENOSCOPY (EGD) WITH PROPOFOL N/A 01/29/2016   Procedure: ESOPHAGOGASTRODUODENOSCOPY (EGD) WITH PROPOFOL;  Surgeon: Lucilla Lame, MD;  Location: [facility];  Service: Endoscopy;  Laterality: N/A;  . PERIPHERAL VASCULAR CATHETERIZATION N/A 02/10/2016   Procedure: Glori Luis Cath Insertion;  Surgeon: Algernon Huxley, MD;  Location: [facility];  Service: Cardiovascular;  Laterality: N/A;    FAMILY HISTORY: Unknown.  Patient is adopted.     ADVANCED DIRECTIVES:    HEALTH MAINTENANCE: Social History  Substance Use Topics  . Smoking status: Former Research scientist (life sciences)  . Smokeless tobacco: Never Used     Comment: Quit in her 15's  . Alcohol use 0.0 oz/week     Comment: 1 drink/mo     Colonoscopy:  PAP:  Bone density:  Lipid panel:  No Known Allergies  Current Outpatient Prescriptions  Medication Sig Dispense Refill  . acyclovir (ZOVIRAX) 800 MG tablet Take 1 tablet (800 mg total) by mouth daily. 90 tablet 1  . escitalopram (LEXAPRO) 10 MG tablet TAKE 1 TABLET (10 MG TOTAL) BY MOUTH DAILY. 90 tablet 1  . lidocaine-prilocaine (EMLA) cream Apply 1 application topically as needed. 30 g 3  . ondansetron (ZOFRAN) 8 MG tablet Take by  mouth every 8 (eight) hours as needed for nausea or vomiting.    . pantoprazole (PROTONIX) 40 MG tablet Take 1 tablet (40 mg total) by mouth daily. 30 tablet 2  . prochlorperazine (COMPAZINE) 10 MG tablet Take 10 mg by mouth every 6 (six) hours as needed for nausea or vomiting.    . Diphenhyd-Hydrocort-Nystatin (FIRST-DUKES MOUTHWASH) SUSP Use as directed 5 mLs in the mouth or  throat 4 (four) times daily as needed. 1 Bottle 2   No current facility-administered medications for this visit.    Facility-Administered Medications Ordered in Other Visits  Medication Dose Route Frequency Provider Last Rate Last Dose  . heparin lock flush 100 unit/mL  500 Units Intravenous Once Lloyd Huger, MD      . sodium chloride flush (NS) 0.9 % injection 10 mL  10 mL Intravenous PRN Lloyd Huger, MD   10 mL at 08/16/16 0925    OBJECTIVE: Vitals:   01/17/17 1100  BP: 120/74  Pulse: 83  Resp: 18  Temp: (!) 96.4 F (35.8 C)     Body mass index is 24.28 kg/m.    ECOG FS:0 - Asymptomatic  General: Well-developed, well-nourished, no acute distress. Eyes: Pink conjunctiva, anicteric sclera. Lungs: Clear to auscultation bilaterally. Heart: Regular rate and rhythm. No rubs, murmurs, or gallops. Abdomen: Mildly distended, nontender.  Musculoskeletal: No edema, cyanosis, or clubbing. Neuro: Alert, answering all questions appropriately. Cranial nerves grossly intact. Skin: No rashes or petechiae noted. Psych: Normal affect.   LAB RESULTS:  Lab Results  Component Value Date   NA 136 01/17/2017   K 4.0 01/17/2017   CL 106 01/17/2017   CO2 25 01/17/2017   GLUCOSE 102 (H) 01/17/2017   BUN 13 01/17/2017   CREATININE 0.77 01/17/2017   CALCIUM 9.0 01/17/2017   PROT 7.0 01/17/2017   ALBUMIN 3.9 01/17/2017   AST 25 01/17/2017   ALT 19 01/17/2017   ALKPHOS 208 (H) 01/17/2017   BILITOT 0.6 01/17/2017   GFRNONAA >60 01/17/2017   GFRAA >60 01/17/2017    Lab Results  Component Value Date   WBC 10.1 01/17/2017   NEUTROABS 7.7 (H) 01/17/2017   HGB 11.8 (L) 01/17/2017   HCT 34.8 (L) 01/17/2017   MCV 92.3 01/17/2017   PLT 140 (L) 01/17/2017   Lab Results  Component Value Date   CA125 86.6 (H) 03/29/2016     STUDIES: US Paracentesis  Result Date: 12/23/2016 INDICATION: Ascites EXAM: ULTRASOUND GUIDED  PARACENTESIS MEDICATIONS: None. COMPLICATIONS: None  immediate. PROCEDURE: Informed written consent was obtained from the patient after a discussion of the risks, benefits and alternatives to treatment. A timeout was performed prior to the initiation of the procedure. Initial ultrasound scanning demonstrates a large amount of ascites within the right lower abdominal quadrant. The right lower abdomen was prepped and draped in the usual sterile fashion. 1% lidocaine with epinephrine was used for local anesthesia. Following this, a Safe-T-Centesis catheter was introduced. An ultrasound image was saved for documentation purposes. The paracentesis was performed. The catheter was removed and a dressing was applied. The patient tolerated the procedure well without immediate post procedural complication. FINDINGS: A total of approximately 700 cc of clear yellow fluid was removed. IMPRESSION: Successful ultrasound-guided paracentesis yielding 0.7 liters of peritoneal fluid. Electronically Signed   By: Marybelle Killings M.D.   On: 12/23/2016 15:17    ASSESSMENT: Peritoneal carcinomatosis, metastatic adenocarcinoma of likely lower GI primary.  PLAN:    1. Peritoneal carcinomatosis: Pathology results reviewed, immunohistochemistry suggested  lower GI primary. PET scan results from October 20, 2016 reviewed independently with what appears to be stable disease. Clinically she is also improving since she requires paracentesis less frequently. Her last paracentesis was on December 23, 2016. Of note, patient had a normal colonoscopy on October 16, 2014 in [place], [place]. Because of suspected progression of disease, patient's treatment was switched to FOLFIRI. Patient had consultation at [facility] who determined that no surgical or intraperitoneal chemotherapy would be beneficial. [facility] recommended adding Avastin with future treatments. Foundation 1 testing did not reveal any actionable mutations. Return to clinic in 1 week for consideration of cycle 13 of FOLFIRI  plus Avastin and then in 3 weeks for further evaluation and consideration of cycle 14. 2: Genetic testing: Negative. 3. Anemia: Mild, monitor. 4. Thrombocytopenia: Mild, monitor. 5. Neutropenia: Neulasta with the remainder treatments as above. 6. Peripheral neuropathy: Resolved. 7. Ascites: Patient had a paracentesis on December 23, 2016 which removed 700 mL of fluid. Her last paracentesis prior to this was on September 16, 2016 when 3.5 L of fluid was removed.  Patient expressed understanding and was in agreement with this plan. She also understands that She can call clinic at any time with any questions, concerns, or complaints.     Lloyd Huger, MD 01/22/17 9:27 AM

## 2017-01-17 ENCOUNTER — Inpatient Hospital Stay (HOSPITAL_BASED_OUTPATIENT_CLINIC_OR_DEPARTMENT_OTHER): Payer: BLUE CROSS/BLUE SHIELD | Admitting: Oncology

## 2017-01-17 ENCOUNTER — Inpatient Hospital Stay: Payer: BLUE CROSS/BLUE SHIELD

## 2017-01-17 VITALS — BP 120/74 | HR 83 | Temp 96.4°F | Resp 18 | Wt 138.8 lb

## 2017-01-17 DIAGNOSIS — Z87891 Personal history of nicotine dependence: Secondary | ICD-10-CM | POA: Diagnosis not present

## 2017-01-17 DIAGNOSIS — C786 Secondary malignant neoplasm of retroperitoneum and peritoneum: Secondary | ICD-10-CM | POA: Diagnosis not present

## 2017-01-17 DIAGNOSIS — K219 Gastro-esophageal reflux disease without esophagitis: Secondary | ICD-10-CM

## 2017-01-17 DIAGNOSIS — R188 Other ascites: Secondary | ICD-10-CM

## 2017-01-17 DIAGNOSIS — D649 Anemia, unspecified: Secondary | ICD-10-CM | POA: Diagnosis not present

## 2017-01-17 DIAGNOSIS — Z79899 Other long term (current) drug therapy: Secondary | ICD-10-CM

## 2017-01-17 DIAGNOSIS — D701 Agranulocytosis secondary to cancer chemotherapy: Secondary | ICD-10-CM

## 2017-01-17 DIAGNOSIS — T451X5S Adverse effect of antineoplastic and immunosuppressive drugs, sequela: Secondary | ICD-10-CM

## 2017-01-17 DIAGNOSIS — C801 Malignant (primary) neoplasm, unspecified: Secondary | ICD-10-CM

## 2017-01-17 DIAGNOSIS — E785 Hyperlipidemia, unspecified: Secondary | ICD-10-CM

## 2017-01-17 DIAGNOSIS — L719 Rosacea, unspecified: Secondary | ICD-10-CM | POA: Diagnosis not present

## 2017-01-17 LAB — COMPREHENSIVE METABOLIC PANEL
ALBUMIN: 3.9 g/dL (ref 3.5–5.0)
ALK PHOS: 208 U/L — AB (ref 38–126)
ALT: 19 U/L (ref 14–54)
AST: 25 U/L (ref 15–41)
Anion gap: 5 (ref 5–15)
BILIRUBIN TOTAL: 0.6 mg/dL (ref 0.3–1.2)
BUN: 13 mg/dL (ref 6–20)
CALCIUM: 9 mg/dL (ref 8.9–10.3)
CO2: 25 mmol/L (ref 22–32)
CREATININE: 0.77 mg/dL (ref 0.44–1.00)
Chloride: 106 mmol/L (ref 101–111)
GFR calc Af Amer: >60 mL/min (ref >60)
GFR calc non Af Amer: >60 mL/min (ref >60)
GLUCOSE: 102 mg/dL — AB (ref 65–99)
Potassium: 4 mmol/L (ref 3.5–5.1)
Sodium: 136 mmol/L (ref 135–145)
TOTAL PROTEIN: 7 g/dL (ref 6.5–8.1)

## 2017-01-17 LAB — CBC WITH DIFFERENTIAL/PLATELET
BASOS ABS: 0 10*3/uL (ref 0–0.1)
Basophils Relative: 0 %
Eosinophils Absolute: 0.1 10*3/uL (ref 0–0.7)
Eosinophils Relative: 1 %
HEMATOCRIT: 34.8 % — AB (ref 35.0–47.0)
HEMOGLOBIN: 11.8 g/dL — AB (ref 12.0–16.0)
LYMPHS PCT: 16 %
Lymphs Abs: 1.7 10*3/uL (ref 1.0–3.6)
MCH: 31.2 pg (ref 26.0–34.0)
MCHC: 33.8 g/dL (ref 32.0–36.0)
MCV: 92.3 fL (ref 80.0–100.0)
MONO ABS: 0.6 10*3/uL (ref 0.2–0.9)
Monocytes Relative: 6 %
NEUTROS ABS: 7.7 10*3/uL — AB (ref 1.4–6.5)
NEUTROS PCT: 77 %
Platelets: 140 10*3/uL — ABNORMAL LOW (ref 150–440)
RBC: 3.77 MIL/uL — ABNORMAL LOW (ref 3.80–5.20)
RDW: 18 % — AB (ref 11.5–14.5)
WBC: 10.1 10*3/uL (ref 3.6–11.0)

## 2017-01-17 MED ORDER — FIRST-DUKES MOUTHWASH MT SUSP: 5.0000 mL | Freq: Four times a day (QID) | OROMUCOSAL | 2 refills | Status: DC | PRN

## 2017-01-17 NOTE — Progress Notes (Signed)
Patient denies any concerns today.  

## 2017-01-24 ENCOUNTER — Other Ambulatory Visit: Payer: Self-pay | Admitting: Oncology

## 2017-01-24 ENCOUNTER — Other Ambulatory Visit: Payer: Self-pay | Admitting: *Deleted

## 2017-01-24 ENCOUNTER — Inpatient Hospital Stay: Payer: BLUE CROSS/BLUE SHIELD

## 2017-01-24 ENCOUNTER — Ambulatory Visit
Admission: RE | Admit: 2017-01-24 | Discharge: 2017-01-24 | Disposition: A | Discharge status: Home / Self Care | Payer: Self-pay | Source: Ambulatory Visit | Attending: Oncology | Admitting: Oncology

## 2017-01-24 VITALS — BP 106/64 | HR 71 | Temp 96.6°F | Resp 18

## 2017-01-24 DIAGNOSIS — C801 Malignant (primary) neoplasm, unspecified: Principal | ICD-10-CM

## 2017-01-24 DIAGNOSIS — C786 Secondary malignant neoplasm of retroperitoneum and peritoneum: Secondary | ICD-10-CM

## 2017-01-24 LAB — CBC WITH DIFFERENTIAL/PLATELET
BASOS ABS: 0 10*3/uL (ref 0–0.1)
BASOS PCT: 0 %
Eosinophils Absolute: 0.1 10*3/uL (ref 0–0.7)
Eosinophils Relative: 1 %
HEMATOCRIT: 34.9 % — AB (ref 35.0–47.0)
HEMOGLOBIN: 11.9 g/dL — AB (ref 12.0–16.0)
LYMPHS PCT: 21 %
Lymphs Abs: 1.5 10*3/uL (ref 1.0–3.6)
MCH: 31.7 pg (ref 26.0–34.0)
MCHC: 34.2 g/dL (ref 32.0–36.0)
MCV: 92.7 fL (ref 80.0–100.0)
MONOS PCT: 11 %
Monocytes Absolute: 0.8 10*3/uL (ref 0.2–0.9)
NEUTROS ABS: 5 10*3/uL (ref 1.4–6.5)
NEUTROS PCT: 67 %
Platelets: 136 10*3/uL — ABNORMAL LOW (ref 150–440)
RBC: 3.77 MIL/uL — ABNORMAL LOW (ref 3.80–5.20)
RDW: 18.1 % — ABNORMAL HIGH (ref 11.5–14.5)
WBC: 7.4 10*3/uL (ref 3.6–11.0)

## 2017-01-24 LAB — COMPREHENSIVE METABOLIC PANEL
ALBUMIN: 3.9 g/dL (ref 3.5–5.0)
ALK PHOS: 183 U/L — AB (ref 38–126)
ALT: 24 U/L (ref 14–54)
AST: 34 U/L (ref 15–41)
Anion gap: 7 (ref 5–15)
BILIRUBIN TOTAL: 0.6 mg/dL (ref 0.3–1.2)
BUN: 13 mg/dL (ref 6–20)
CALCIUM: 8.8 mg/dL — AB (ref 8.9–10.3)
CO2: 25 mmol/L (ref 22–32)
CREATININE: 0.75 mg/dL (ref 0.44–1.00)
Chloride: 102 mmol/L (ref 101–111)
GFR calc Af Amer: >60 mL/min (ref >60)
GFR calc non Af Amer: >60 mL/min (ref >60)
GLUCOSE: 96 mg/dL (ref 65–99)
Potassium: 4 mmol/L (ref 3.5–5.1)
Sodium: 134 mmol/L — ABNORMAL LOW (ref 135–145)
TOTAL PROTEIN: 6.9 g/dL (ref 6.5–8.1)

## 2017-01-24 LAB — PROTEIN, URINE, RANDOM: TOTAL PROTEIN, URINE: 11 mg/dL

## 2017-01-24 MED ORDER — PALONOSETRON HCL INJECTION 0.25 MG/5ML
0.2500 mg | Freq: Once | INTRAVENOUS | Status: AC
  Administered 2017-01-24: 0.25 mg via INTRAVENOUS
  Filled 2017-01-24: qty 5

## 2017-01-24 MED ORDER — FLUOROURACIL CHEMO INJECTION 5 GM/100ML
2400.0000 mg/m2 | INTRAVENOUS | Status: DC
  Administered 2017-01-24: 4100 mg via INTRAVENOUS
  Filled 2017-01-24: qty 82

## 2017-01-24 MED ORDER — SODIUM CHLORIDE 0.9 % IV SOLN
300.0000 mg | Freq: Once | INTRAVENOUS | Status: AC
  Administered 2017-01-24: 300 mg via INTRAVENOUS
  Filled 2017-01-24: qty 12

## 2017-01-24 MED ORDER — DEXAMETHASONE SODIUM PHOSPHATE 10 MG/ML IJ SOLN
10.0000 mg | Freq: Once | INTRAMUSCULAR | Status: AC
  Administered 2017-01-24: 10 mg via INTRAVENOUS
  Filled 2017-01-24: qty 1

## 2017-01-24 MED ORDER — FLUOROURACIL CHEMO INJECTION 2.5 GM/50ML
400.0000 mg/m2 | Freq: Once | INTRAVENOUS | Status: AC
  Administered 2017-01-24: 700 mg via INTRAVENOUS
  Filled 2017-01-24: qty 14

## 2017-01-24 MED ORDER — HEPARIN SOD (PORK) LOCK FLUSH 100 UNIT/ML IV SOLN
500.0000 [IU] | Freq: Once | INTRAVENOUS | Status: DC
  Filled 2017-01-24: qty 5

## 2017-01-24 MED ORDER — LEUCOVORIN CALCIUM INJECTION 350 MG
700.0000 mg | Freq: Once | INTRAVENOUS | Status: AC
  Administered 2017-01-24: 700 mg via INTRAVENOUS
  Filled 2017-01-24: qty 35

## 2017-01-24 MED ORDER — SODIUM CHLORIDE 0.9 % IV SOLN
Freq: Once | INTRAVENOUS | Status: AC
  Administered 2017-01-24: 10:00:00 via INTRAVENOUS
  Filled 2017-01-24: qty 1000

## 2017-01-24 MED ORDER — SODIUM CHLORIDE 0.9% FLUSH
10.0000 mL | INTRAVENOUS | Status: DC | PRN
  Administered 2017-01-24: 10 mL via INTRAVENOUS
  Filled 2017-01-24: qty 10

## 2017-01-24 MED ORDER — IRINOTECAN HCL CHEMO INJECTION 100 MG/5ML
180.0000 mg/m2 | Freq: Once | INTRAVENOUS | Status: AC
  Administered 2017-01-24: 300 mg via INTRAVENOUS
  Filled 2017-01-24: qty 15

## 2017-01-24 MED ORDER — ATROPINE SULFATE 1 MG/ML IJ SOLN
0.5000 mg | Freq: Once | INTRAMUSCULAR | Status: AC | PRN
  Administered 2017-01-24: 0.5 mg via INTRAVENOUS
  Filled 2017-01-24 (×2): qty 1

## 2017-01-26 ENCOUNTER — Inpatient Hospital Stay: Payer: BLUE CROSS/BLUE SHIELD

## 2017-01-26 DIAGNOSIS — C786 Secondary malignant neoplasm of retroperitoneum and peritoneum: Secondary | ICD-10-CM | POA: Diagnosis not present

## 2017-01-26 DIAGNOSIS — C801 Malignant (primary) neoplasm, unspecified: Principal | ICD-10-CM

## 2017-01-26 MED ORDER — PEGFILGRASTIM 6 MG/0.6ML ~~LOC~~ PSKT
6.0000 mg | PREFILLED_SYRINGE | Freq: Once | SUBCUTANEOUS | Status: AC
  Administered 2017-01-26: 6 mg via SUBCUTANEOUS

## 2017-01-26 MED ORDER — HEPARIN SOD (PORK) LOCK FLUSH 100 UNIT/ML IV SOLN
500.0000 [IU] | Freq: Once | INTRAVENOUS | Status: AC | PRN
  Administered 2017-01-26: 500 [IU]
  Filled 2017-01-26: qty 5

## 2017-01-26 MED ORDER — SODIUM CHLORIDE 0.9% FLUSH
10.0000 mL | INTRAVENOUS | Status: DC | PRN
Start: 2017-01-26 — End: 2018-03-30
  Administered 2017-01-26: 10 mL
  Filled 2017-01-26: qty 10

## 2017-01-26 MED ORDER — PEGFILGRASTIM INJECTION 6 MG/0.6ML ~~LOC~~
6.0000 mg | PREFILLED_SYRINGE | Freq: Once | SUBCUTANEOUS | Status: DC
Start: 2017-01-26 — End: 2017-01-26
  Filled 2017-01-26: qty 0.6

## 2017-02-06 NOTE — Progress Notes (Signed)
[place]  Telephone:(336) [phone] Fax:(336) [phone]  ID: Valerie Horton OB: [birth year]  MR#: [id number]  VPX#:[id number]  Patient Care Team: Valerie Roys, DO as PCP - General (Family Medicine) Clent Jacks, RN as Registered Nurse Garnetta Buddy, MD as Consulting Physician (Internal Medicine)  CHIEF COMPLAINT: Peritoneal carcinomatosis, metastatic adenocarcinoma of likely lower GI primary.  INTERVAL HISTORY: Patient returns to clinic today for further evaluation and consideration of cycle 14 of FOLFIRI plus Avastin. She recently had consultation at [facility] who recommended adding Avastin to her treatment. She continues to have mild bloating but denies any pain.  She has no neurological complaints. She denies any recent fevers or illnesses.  She has no chest pain or shortness of breath.  She denies and nausea, vomiting, constipation, or diarrhea. She has no melena or hematochezia. She has no urinary complains.  Patient offers no further specific complaints.  REVIEW OF SYSTEMS:   Review of Systems  Constitutional: Negative for fever, malaise/fatigue and weight loss.  Respiratory: Negative.  Negative for cough and shortness of breath.   Cardiovascular: Negative.  Negative for chest pain.  Gastrointestinal: Negative for abdominal pain, blood in stool, constipation, diarrhea, melena and vomiting.       Positive for abdominal bloating  Genitourinary: Negative.   Musculoskeletal: Negative.   Neurological: Positive for sensory change. Negative for weakness.  Psychiatric/Behavioral: The patient is not nervous/anxious.     As per HPI. Otherwise, a complete review of systems is negative.  PAST MEDICAL HISTORY: Past Medical History:  Diagnosis Date  . Acid reflux   . Allergic rhinitis   . Anxiety   . Arthritis    left knee  . Cancer ([place])   . Cervical spondylosis   . CTS (carpal tunnel syndrome)    Right  . Diverticulosis   . HSV-2 (herpes simplex  virus 2) infection   . Hyperlipidemia   . Ovarian failure   . Rosacea   . Wears contact lenses     PAST SURGICAL HISTORY: Past Surgical History:  Procedure Laterality Date  . ANTERIOR CRUCIATE LIGAMENT REPAIR Left   . CESAREAN SECTION    . COLONOSCOPY WITH PROPOFOL N/A 01/29/2016   Procedure: COLONOSCOPY WITH PROPOFOL;  Surgeon: Lucilla Lame, MD;  Location: [place];  Service: Endoscopy;  Laterality: N/A;  . ESOPHAGOGASTRODUODENOSCOPY (EGD) WITH PROPOFOL N/A 01/29/2016   Procedure: ESOPHAGOGASTRODUODENOSCOPY (EGD) WITH PROPOFOL;  Surgeon: Lucilla Lame, MD;  Location: [place];  Service: Endoscopy;  Laterality: N/A;  . PERIPHERAL VASCULAR CATHETERIZATION N/A 02/10/2016   Procedure: Glori Luis Cath Insertion;  Surgeon: Algernon Huxley, MD;  Location: [place] [facility];  Service: Cardiovascular;  Laterality: N/A;    FAMILY HISTORY: Unknown.  Patient is adopted.     ADVANCED DIRECTIVES:    HEALTH MAINTENANCE: Social History  Substance Use Topics  . Smoking status: Former Research scientist (life sciences)  . Smokeless tobacco: Never Used     Comment: Quit in her 65's  . Alcohol use 0.0 oz/week     Comment: 1 drink/mo     Colonoscopy:  PAP:  Bone density:  Lipid panel:  No Known Allergies  Current Outpatient Prescriptions  Medication Sig Dispense Refill  . acyclovir (ZOVIRAX) 800 MG tablet Take 1 tablet (800 mg total) by mouth daily. 90 tablet 1  . Diphenhyd-Hydrocort-Nystatin (FIRST-DUKES MOUTHWASH) SUSP Use as directed 5 mLs in the mouth or throat 4 (four) times daily as needed. 1 Bottle 2  . escitalopram (LEXAPRO) 10 MG tablet TAKE  1 TABLET (10 MG TOTAL) BY MOUTH DAILY. 90 tablet 1  . lidocaine-prilocaine (EMLA) cream Apply 1 application topically as needed. 30 g 3  . pantoprazole (PROTONIX) 40 MG tablet Take 1 tablet (40 mg total) by mouth daily. 30 tablet 2  . ondansetron (ZOFRAN) 8 MG tablet Take by mouth every 8 (eight) hours as needed for nausea or vomiting.    . prochlorperazine  (COMPAZINE) 10 MG tablet Take 10 mg by mouth every 6 (six) hours as needed for nausea or vomiting.     No current facility-administered medications for this visit.    Facility-Administered Medications Ordered in Other Visits  Medication Dose Route Frequency Provider Last Rate Last Dose  . heparin lock flush 100 unit/mL  500 Units Intravenous Once Lloyd Huger, MD      . sodium chloride flush (NS) 0.9 % injection 10 mL  10 mL Intravenous PRN Lloyd Huger, MD   10 mL at 08/16/16 0925  . sodium chloride flush (NS) 0.9 % injection 10 mL  10 mL Intracatheter PRN Lloyd Huger, MD   10 mL at 01/26/17 1343    OBJECTIVE: Vitals:   02/07/17 0934  BP: 112/72  Pulse: 80  Temp: (!) 97.1 F (36.2 C)     Body mass index is 24.4 kg/m.    ECOG FS:0 - Asymptomatic  General: Well-developed, well-nourished, no acute distress. Eyes: Pink conjunctiva, anicteric sclera. Lungs: Clear to auscultation bilaterally. Heart: Regular rate and rhythm. No rubs, murmurs, or gallops. Abdomen: Mildly distended, nontender.  Musculoskeletal: No edema, cyanosis, or clubbing. Neuro: Alert, answering all questions appropriately. Cranial nerves grossly intact. Skin: No rashes or petechiae noted. Psych: Normal affect.   LAB RESULTS:  Lab Results  Component Value Date   NA 134 (L) 02/07/2017   K 4.1 02/07/2017   CL 103 02/07/2017   CO2 24 02/07/2017   GLUCOSE 99 02/07/2017   BUN 15 02/07/2017   CREATININE 0.69 02/07/2017   CALCIUM 8.9 02/07/2017   PROT 6.8 02/07/2017   ALBUMIN 4.0 02/07/2017   AST 26 02/07/2017   ALT 19 02/07/2017   ALKPHOS 217 (H) 02/07/2017   BILITOT 0.5 02/07/2017   GFRNONAA >60 02/07/2017   GFRAA >60 02/07/2017    Lab Results  Component Value Date   WBC 8.4 02/07/2017   NEUTROABS 6.0 02/07/2017   HGB 12.0 02/07/2017   HCT 35.2 02/07/2017   MCV 91.7 02/07/2017   PLT 122 (L) 02/07/2017   Lab Results  Component Value Date   CA125 86.6 (H) 03/29/2016      STUDIES: No results found.  ASSESSMENT: Peritoneal carcinomatosis, metastatic adenocarcinoma of likely lower GI primary.  PLAN:    1. Peritoneal carcinomatosis: Pathology results reviewed, immunohistochemistry suggested lower GI primary. CT scan results from [facility] on January 11, 2017 essentially revealed stable disease. Clinically she is also improving since she requires paracentesis less frequently. Her last paracentesis was on December 23, 2016. Of note, patient had a normal colonoscopy on October 16, 2014 in [place], [place]. Because of suspected progression of disease, patient's treatment was switched to FOLFIRI. Patient also had consultation at [facility] who determined that no surgical or intraperitoneal chemotherapy would be beneficial. Foundation 1 testing did not reveal any actionable mutations. Proceed with cycle 14 of FOLFIRI plus Avastin today. Return to clinic in 2 days for pump removal and Neulasta, then in 2 weeks for consideration of cycle 15. Will reimage in October 2018. 2: Genetic testing: Negative. 3. Anemia: Resolved.  4. Thrombocytopenia: Mild, monitor. 5. Neutropenia: Neulasta with the remainder treatments as above. 6. Peripheral neuropathy: Resolved. 7. Ascites: Patient had a paracentesis on December 23, 2016 which removed 700 mL of fluid. Her last paracentesis prior to this was on September 16, 2016 when 3.5 L of fluid was removed.  Patient expressed understanding and was in agreement with this plan. She also understands that She can call clinic at any time with any questions, concerns, or complaints.     Lloyd Huger, MD 02/10/17 8:47 AM

## 2017-02-07 ENCOUNTER — Inpatient Hospital Stay: Payer: BLUE CROSS/BLUE SHIELD | Attending: Oncology

## 2017-02-07 ENCOUNTER — Inpatient Hospital Stay: Payer: BLUE CROSS/BLUE SHIELD

## 2017-02-07 ENCOUNTER — Inpatient Hospital Stay (HOSPITAL_BASED_OUTPATIENT_CLINIC_OR_DEPARTMENT_OTHER): Payer: BLUE CROSS/BLUE SHIELD | Admitting: Oncology

## 2017-02-07 ENCOUNTER — Encounter: Payer: Self-pay | Admitting: Oncology

## 2017-02-07 VITALS — BP 112/72 | HR 80 | Temp 97.1°F | Wt 139.5 lb

## 2017-02-07 DIAGNOSIS — R188 Other ascites: Secondary | ICD-10-CM

## 2017-02-07 DIAGNOSIS — E785 Hyperlipidemia, unspecified: Secondary | ICD-10-CM | POA: Diagnosis not present

## 2017-02-07 DIAGNOSIS — T451X5S Adverse effect of antineoplastic and immunosuppressive drugs, sequela: Secondary | ICD-10-CM | POA: Insufficient documentation

## 2017-02-07 DIAGNOSIS — K219 Gastro-esophageal reflux disease without esophagitis: Secondary | ICD-10-CM

## 2017-02-07 DIAGNOSIS — Z87891 Personal history of nicotine dependence: Secondary | ICD-10-CM | POA: Insufficient documentation

## 2017-02-07 DIAGNOSIS — C801 Malignant (primary) neoplasm, unspecified: Secondary | ICD-10-CM

## 2017-02-07 DIAGNOSIS — C786 Secondary malignant neoplasm of retroperitoneum and peritoneum: Secondary | ICD-10-CM | POA: Diagnosis not present

## 2017-02-07 DIAGNOSIS — Z5112 Encounter for antineoplastic immunotherapy: Secondary | ICD-10-CM | POA: Insufficient documentation

## 2017-02-07 DIAGNOSIS — M1712 Unilateral primary osteoarthritis, left knee: Secondary | ICD-10-CM | POA: Insufficient documentation

## 2017-02-07 DIAGNOSIS — Z79899 Other long term (current) drug therapy: Secondary | ICD-10-CM | POA: Diagnosis not present

## 2017-02-07 DIAGNOSIS — D696 Thrombocytopenia, unspecified: Secondary | ICD-10-CM | POA: Insufficient documentation

## 2017-02-07 DIAGNOSIS — Z5111 Encounter for antineoplastic chemotherapy: Secondary | ICD-10-CM | POA: Diagnosis not present

## 2017-02-07 DIAGNOSIS — D701 Agranulocytosis secondary to cancer chemotherapy: Secondary | ICD-10-CM | POA: Insufficient documentation

## 2017-02-07 LAB — CBC WITH DIFFERENTIAL/PLATELET
BASOS ABS: 0 10*3/uL (ref 0–0.1)
Basophils Relative: 1 %
EOS PCT: 1 %
Eosinophils Absolute: 0.1 10*3/uL (ref 0–0.7)
HCT: 35.2 % (ref 35.0–47.0)
Hemoglobin: 12 g/dL (ref 12.0–16.0)
LYMPHS ABS: 1.7 10*3/uL (ref 1.0–3.6)
LYMPHS PCT: 20 %
MCH: 31.4 pg (ref 26.0–34.0)
MCHC: 34.2 g/dL (ref 32.0–36.0)
MCV: 91.7 fL (ref 80.0–100.0)
MONO ABS: 0.6 10*3/uL (ref 0.2–0.9)
Monocytes Relative: 8 %
NEUTROS ABS: 6 10*3/uL (ref 1.4–6.5)
Neutrophils Relative %: 70 %
Platelets: 122 10*3/uL — ABNORMAL LOW (ref 150–440)
RBC: 3.84 MIL/uL (ref 3.80–5.20)
RDW: 17.7 % — ABNORMAL HIGH (ref 11.5–14.5)
WBC: 8.4 10*3/uL (ref 3.6–11.0)

## 2017-02-07 LAB — COMPREHENSIVE METABOLIC PANEL
ALK PHOS: 217 U/L — AB (ref 38–126)
ALT: 19 U/L (ref 14–54)
AST: 26 U/L (ref 15–41)
Albumin: 4 g/dL (ref 3.5–5.0)
Anion gap: 7 (ref 5–15)
BILIRUBIN TOTAL: 0.5 mg/dL (ref 0.3–1.2)
BUN: 15 mg/dL (ref 6–20)
CALCIUM: 8.9 mg/dL (ref 8.9–10.3)
CO2: 24 mmol/L (ref 22–32)
CREATININE: 0.69 mg/dL (ref 0.44–1.00)
Chloride: 103 mmol/L (ref 101–111)
GFR calc non Af Amer: >60 mL/min (ref >60)
GLUCOSE: 99 mg/dL (ref 65–99)
Potassium: 4.1 mmol/L (ref 3.5–5.1)
SODIUM: 134 mmol/L — AB (ref 135–145)
TOTAL PROTEIN: 6.8 g/dL (ref 6.5–8.1)

## 2017-02-07 LAB — PROTEIN, URINE, RANDOM: Total Protein, Urine: 8 mg/dL

## 2017-02-07 MED ORDER — SODIUM CHLORIDE 0.9 % IV SOLN
300.0000 mg | Freq: Once | INTRAVENOUS | Status: AC
  Administered 2017-02-07: 300 mg via INTRAVENOUS
  Filled 2017-02-07: qty 12

## 2017-02-07 MED ORDER — LEUCOVORIN CALCIUM INJECTION 350 MG
700.0000 mg | Freq: Once | INTRAVENOUS | Status: AC
  Administered 2017-02-07: 700 mg via INTRAVENOUS
  Filled 2017-02-07: qty 35

## 2017-02-07 MED ORDER — SODIUM CHLORIDE 0.9 % IV SOLN
2400.0000 mg/m2 | INTRAVENOUS | Status: DC
  Administered 2017-02-07: 4100 mg via INTRAVENOUS
  Filled 2017-02-07: qty 82

## 2017-02-07 MED ORDER — DEXAMETHASONE SODIUM PHOSPHATE 10 MG/ML IJ SOLN
10.0000 mg | Freq: Once | INTRAMUSCULAR | Status: AC
  Administered 2017-02-07: 10 mg via INTRAVENOUS
  Filled 2017-02-07: qty 1

## 2017-02-07 MED ORDER — SODIUM CHLORIDE 0.9 % IV SOLN
Freq: Once | INTRAVENOUS | Status: AC
Start: 2017-02-07 — End: 2017-02-07
  Administered 2017-02-07: 10:00:00 via INTRAVENOUS
  Filled 2017-02-07: qty 1000

## 2017-02-07 MED ORDER — FLUOROURACIL CHEMO INJECTION 2.5 GM/50ML
400.0000 mg/m2 | Freq: Once | INTRAVENOUS | Status: AC
Start: 2017-02-07 — End: 2017-02-07
  Administered 2017-02-07: 700 mg via INTRAVENOUS
  Filled 2017-02-07: qty 14

## 2017-02-07 MED ORDER — IRINOTECAN HCL CHEMO INJECTION 100 MG/5ML
180.0000 mg/m2 | Freq: Once | INTRAVENOUS | Status: AC
  Administered 2017-02-07: 300 mg via INTRAVENOUS
  Filled 2017-02-07: qty 15

## 2017-02-07 MED ORDER — PALONOSETRON HCL INJECTION 0.25 MG/5ML
0.2500 mg | Freq: Once | INTRAVENOUS | Status: AC
  Administered 2017-02-07: 0.25 mg via INTRAVENOUS
  Filled 2017-02-07: qty 5

## 2017-02-07 MED ORDER — HEPARIN SOD (PORK) LOCK FLUSH 100 UNIT/ML IV SOLN: 500.0000 [IU] | Freq: Once | INTRAVENOUS | Status: DC

## 2017-02-07 MED ORDER — ATROPINE SULFATE 1 MG/ML IJ SOLN
0.5000 mg | Freq: Once | INTRAMUSCULAR | Status: AC | PRN
  Administered 2017-02-07: 0.5 mg via INTRAVENOUS
  Filled 2017-02-07: qty 1

## 2017-02-07 MED ORDER — SODIUM CHLORIDE 0.9 % IV SOLN
Freq: Once | INTRAVENOUS | Status: AC
  Filled 2017-02-07: qty 1000

## 2017-02-07 MED ORDER — SODIUM CHLORIDE 0.9% FLUSH
10.0000 mL | INTRAVENOUS | Status: DC | PRN
  Administered 2017-02-07: 10 mL via INTRAVENOUS
  Filled 2017-02-07: qty 10

## 2017-02-09 ENCOUNTER — Inpatient Hospital Stay: Payer: BLUE CROSS/BLUE SHIELD

## 2017-02-09 VITALS — BP 108/71 | HR 69 | Temp 97.0°F | Resp 18

## 2017-02-09 DIAGNOSIS — C786 Secondary malignant neoplasm of retroperitoneum and peritoneum: Secondary | ICD-10-CM

## 2017-02-09 DIAGNOSIS — C801 Malignant (primary) neoplasm, unspecified: Principal | ICD-10-CM

## 2017-02-09 MED ORDER — HEPARIN SOD (PORK) LOCK FLUSH 100 UNIT/ML IV SOLN
500.0000 [IU] | Freq: Once | INTRAVENOUS | Status: AC | PRN
  Administered 2017-02-09: 500 [IU]

## 2017-02-09 MED ORDER — PEGFILGRASTIM INJECTION 6 MG/0.6ML ~~LOC~~
6.0000 mg | PREFILLED_SYRINGE | Freq: Once | SUBCUTANEOUS | Status: AC
  Administered 2017-02-09: 6 mg via SUBCUTANEOUS
  Filled 2017-02-09: qty 0.6

## 2017-02-09 MED ORDER — SODIUM CHLORIDE 0.9% FLUSH
10.0000 mL | INTRAVENOUS | Status: DC | PRN
  Administered 2017-02-09: 10 mL
  Filled 2017-02-09: qty 10

## 2017-02-20 NOTE — Progress Notes (Signed)
[place]  Telephone:(336) [phone] Fax:(336) [phone]  ID: ARLENNE KIMBLEY OB: [birth year]  MR#: [id number]  ENM#:[id number]  Patient Care Team: Valerie Roys, DO as PCP - General (Family Medicine) Clent Jacks, RN as Registered Nurse Garnetta Buddy, MD as Consulting Physician (Internal Medicine)  CHIEF COMPLAINT: Peritoneal carcinomatosis, metastatic adenocarcinoma of likely lower GI primary.  INTERVAL HISTORY: Patient returns to clinic today for further evaluation and consideration of cycle 15 of FOLFIRI plus Avastin. She continues to have mild bloating but denies any pain.  She has no neurological complaints. She denies any recent fevers or illnesses.  She has no chest pain or shortness of breath.  She denies and nausea, vomiting, constipation, or diarrhea. She has no melena or hematochezia. She has no urinary complains.  Patient offers no further specific complaints.  REVIEW OF SYSTEMS:   Review of Systems  Constitutional: Negative for fever, malaise/fatigue and weight loss.  Respiratory: Negative.  Negative for cough and shortness of breath.   Cardiovascular: Negative.  Negative for chest pain.  Gastrointestinal: Negative for abdominal pain, blood in stool, constipation, diarrhea, melena and vomiting.       Positive for abdominal bloating  Genitourinary: Negative.   Musculoskeletal: Negative.   Neurological: Positive for sensory change. Negative for weakness.  Psychiatric/Behavioral: The patient is not nervous/anxious.     As per HPI. Otherwise, a complete review of systems is negative.  PAST MEDICAL HISTORY: Past Medical History:  Diagnosis Date  . Acid reflux   . Allergic rhinitis   . Anxiety   . Arthritis    left knee  . Cancer ([place])   . Cervical spondylosis   . CTS (carpal tunnel syndrome)    Right  . Diverticulosis   . HSV-2 (herpes simplex virus 2) infection   . Hyperlipidemia   . Ovarian failure   . Rosacea   . Wears contact  lenses     PAST SURGICAL HISTORY: Past Surgical History:  Procedure Laterality Date  . ANTERIOR CRUCIATE LIGAMENT REPAIR Left   . CESAREAN SECTION    . COLONOSCOPY WITH PROPOFOL N/A 01/29/2016   Procedure: COLONOSCOPY WITH PROPOFOL;  Surgeon: Lucilla Lame, MD;  Location: [place];  Service: Endoscopy;  Laterality: N/A;  . ESOPHAGOGASTRODUODENOSCOPY (EGD) WITH PROPOFOL N/A 01/29/2016   Procedure: ESOPHAGOGASTRODUODENOSCOPY (EGD) WITH PROPOFOL;  Surgeon: Lucilla Lame, MD;  Location: [place];  Service: Endoscopy;  Laterality: N/A;  . PERIPHERAL VASCULAR CATHETERIZATION N/A 02/10/2016   Procedure: Glori Luis Cath Insertion;  Surgeon: Algernon Huxley, MD;  Location: [place] [facility];  Service: Cardiovascular;  Laterality: N/A;    FAMILY HISTORY: Unknown.  Patient is adopted.     ADVANCED DIRECTIVES:    HEALTH MAINTENANCE: Social History  Substance Use Topics  . Smoking status: Former Research scientist (life sciences)  . Smokeless tobacco: Never Used     Comment: Quit in her 36's  . Alcohol use 0.0 oz/week     Comment: 1 drink/mo     Colonoscopy:  PAP:  Bone density:  Lipid panel:  No Known Allergies  Current Outpatient Prescriptions  Medication Sig Dispense Refill  . acyclovir (ZOVIRAX) 800 MG tablet Take 1 tablet (800 mg total) by mouth daily. (Patient taking differently: Take 800 mg by mouth daily as needed. ) 90 tablet 1  . Diphenhyd-Hydrocort-Nystatin (FIRST-DUKES MOUTHWASH) SUSP Use as directed 5 mLs in the mouth or throat 4 (four) times daily as needed. 1 Bottle 2  . escitalopram (LEXAPRO) 10 MG tablet TAKE 1 TABLET (  10 MG TOTAL) BY MOUTH DAILY. 90 tablet 1  . lidocaine-prilocaine (EMLA) cream Apply 1 application topically as needed. 30 g 3  . ondansetron (ZOFRAN) 8 MG tablet Take by mouth every 8 (eight) hours as needed for nausea or vomiting.    . pantoprazole (PROTONIX) 40 MG tablet Take 1 tablet (40 mg total) by mouth daily. 30 tablet 2  . prochlorperazine (COMPAZINE) 10 MG  tablet Take 10 mg by mouth every 6 (six) hours as needed for nausea or vomiting.     No current facility-administered medications for this visit.    Facility-Administered Medications Ordered in Other Visits  Medication Dose Route Frequency Provider Last Rate Last Dose  . heparin lock flush 100 unit/mL  500 Units Intravenous Once Lloyd Huger, MD      . sodium chloride flush (NS) 0.9 % injection 10 mL  10 mL Intravenous PRN Lloyd Huger, MD   10 mL at 08/16/16 0925  . sodium chloride flush (NS) 0.9 % injection 10 mL  10 mL Intracatheter PRN Lloyd Huger, MD   10 mL at 01/26/17 1343    OBJECTIVE: Vitals:   02/21/17 0942  BP: 118/78  Pulse: 80  Resp: 18  Temp: (!) 96.7 F (35.9 C)     Body mass index is 24.52 kg/m.    ECOG FS:0 - Asymptomatic  General: Well-developed, well-nourished, no acute distress. Eyes: Pink conjunctiva, anicteric sclera. Lungs: Clear to auscultation bilaterally. Heart: Regular rate and rhythm. No rubs, murmurs, or gallops. Abdomen: Mildly distended, nontender.  Musculoskeletal: No edema, cyanosis, or clubbing. Neuro: Alert, answering all questions appropriately. Cranial nerves grossly intact. Skin: No rashes or petechiae noted. Psych: Normal affect.   LAB RESULTS:  Lab Results  Component Value Date   NA 135 02/21/2017   K 4.1 02/21/2017   CL 104 02/21/2017   CO2 24 02/21/2017   GLUCOSE 103 (H) 02/21/2017   BUN 11 02/21/2017   CREATININE 0.82 02/21/2017   CALCIUM 8.9 02/21/2017   PROT 6.9 02/21/2017   ALBUMIN 3.9 02/21/2017   AST 26 02/21/2017   ALT 19 02/21/2017   ALKPHOS 196 (H) 02/21/2017   BILITOT 0.5 02/21/2017   GFRNONAA >60 02/21/2017   GFRAA >60 02/21/2017    Lab Results  Component Value Date   WBC 6.0 02/21/2017   NEUTROABS 4.2 02/21/2017   HGB 12.1 02/21/2017   HCT 35.6 02/21/2017   MCV 91.5 02/21/2017   PLT 119 (L) 02/21/2017   Lab Results  Component Value Date   CA125 86.6 (H) 03/29/2016      STUDIES: No results found.  ASSESSMENT: Peritoneal carcinomatosis, metastatic adenocarcinoma of likely lower GI primary.  PLAN:    1. Peritoneal carcinomatosis: Pathology results reviewed, immunohistochemistry suggested lower GI primary. CT scan results from [facility] on January 11, 2017 essentially revealed stable disease. Clinically she is also improving since she requires paracentesis less frequently. Her last paracentesis was on December 23, 2016. Of note, patient had a normal colonoscopy on October 16, 2014 in [place], [place]. Because of suspected progression of disease, patient's treatment was switched to FOLFIRI. Patient also had consultation at [facility] who determined that no surgical or intraperitoneal chemotherapy would be beneficial. Foundation 1 testing did not reveal any actionable mutations. Proceed with cycle 15 of FOLFIRI plus Avastin today. Return to clinic in 2 days for pump removal and Neulasta, then in 2 weeks for consideration of cycle 16. Will reimage in October 2018. 2: Genetic testing: Negative. 3. Anemia:  Resolved. 4. Thrombocytopenia: Mild, monitor. 5. Neutropenia: Neulasta with the remainder treatments as above. 6. Peripheral neuropathy: Resolved. 7. Ascites: Patient had a paracentesis on December 23, 2016 which removed 700 mL of fluid. Her last paracentesis prior to this was on September 16, 2016 when 3.5 L of fluid was removed.  Patient expressed understanding and was in agreement with this plan. She also understands that She can call clinic at any time with any questions, concerns, or complaints.     Lloyd Huger, MD 02/23/17 8:45 AM

## 2017-02-21 ENCOUNTER — Inpatient Hospital Stay (HOSPITAL_BASED_OUTPATIENT_CLINIC_OR_DEPARTMENT_OTHER): Payer: BLUE CROSS/BLUE SHIELD | Admitting: Oncology

## 2017-02-21 ENCOUNTER — Encounter: Payer: Self-pay | Admitting: Oncology

## 2017-02-21 ENCOUNTER — Inpatient Hospital Stay: Payer: BLUE CROSS/BLUE SHIELD

## 2017-02-21 ENCOUNTER — Telehealth: Payer: Self-pay | Admitting: *Deleted

## 2017-02-21 VITALS — BP 118/78 | HR 80 | Temp 96.7°F | Resp 18 | Ht 63.4 in | Wt 140.2 lb

## 2017-02-21 DIAGNOSIS — D696 Thrombocytopenia, unspecified: Secondary | ICD-10-CM | POA: Diagnosis not present

## 2017-02-21 DIAGNOSIS — C786 Secondary malignant neoplasm of retroperitoneum and peritoneum: Secondary | ICD-10-CM

## 2017-02-21 DIAGNOSIS — D701 Agranulocytosis secondary to cancer chemotherapy: Secondary | ICD-10-CM

## 2017-02-21 DIAGNOSIS — C801 Malignant (primary) neoplasm, unspecified: Secondary | ICD-10-CM | POA: Diagnosis not present

## 2017-02-21 DIAGNOSIS — E785 Hyperlipidemia, unspecified: Secondary | ICD-10-CM | POA: Diagnosis not present

## 2017-02-21 DIAGNOSIS — K219 Gastro-esophageal reflux disease without esophagitis: Secondary | ICD-10-CM

## 2017-02-21 DIAGNOSIS — M1712 Unilateral primary osteoarthritis, left knee: Secondary | ICD-10-CM | POA: Diagnosis not present

## 2017-02-21 DIAGNOSIS — Z87891 Personal history of nicotine dependence: Secondary | ICD-10-CM

## 2017-02-21 DIAGNOSIS — R188 Other ascites: Secondary | ICD-10-CM | POA: Diagnosis not present

## 2017-02-21 DIAGNOSIS — T451X5S Adverse effect of antineoplastic and immunosuppressive drugs, sequela: Secondary | ICD-10-CM

## 2017-02-21 DIAGNOSIS — Z79899 Other long term (current) drug therapy: Secondary | ICD-10-CM | POA: Diagnosis not present

## 2017-02-21 LAB — CBC WITH DIFFERENTIAL/PLATELET
BASOS PCT: 0 %
Basophils Absolute: 0 10*3/uL (ref 0–0.1)
EOS ABS: 0.1 10*3/uL (ref 0–0.7)
EOS PCT: 1 %
HCT: 35.6 % (ref 35.0–47.0)
Hemoglobin: 12.1 g/dL (ref 12.0–16.0)
Lymphocytes Relative: 19 %
Lymphs Abs: 1.1 10*3/uL (ref 1.0–3.6)
MCH: 31.1 pg (ref 26.0–34.0)
MCHC: 34 g/dL (ref 32.0–36.0)
MCV: 91.5 fL (ref 80.0–100.0)
Monocytes Absolute: 0.5 10*3/uL (ref 0.2–0.9)
Monocytes Relative: 9 %
Neutro Abs: 4.2 10*3/uL (ref 1.4–6.5)
Neutrophils Relative %: 71 %
PLATELETS: 119 10*3/uL — AB (ref 150–440)
RBC: 3.89 MIL/uL (ref 3.80–5.20)
RDW: 17.3 % — ABNORMAL HIGH (ref 11.5–14.5)
WBC: 6 10*3/uL (ref 3.6–11.0)

## 2017-02-21 LAB — COMPREHENSIVE METABOLIC PANEL
ALT: 19 U/L (ref 14–54)
ANION GAP: 7 (ref 5–15)
AST: 26 U/L (ref 15–41)
Albumin: 3.9 g/dL (ref 3.5–5.0)
Alkaline Phosphatase: 196 U/L — ABNORMAL HIGH (ref 38–126)
BUN: 11 mg/dL (ref 6–20)
CHLORIDE: 104 mmol/L (ref 101–111)
CO2: 24 mmol/L (ref 22–32)
Calcium: 8.9 mg/dL (ref 8.9–10.3)
Creatinine, Ser: 0.82 mg/dL (ref 0.44–1.00)
GFR calc non Af Amer: >60 mL/min (ref >60)
Glucose, Bld: 103 mg/dL — ABNORMAL HIGH (ref 65–99)
Potassium: 4.1 mmol/L (ref 3.5–5.1)
SODIUM: 135 mmol/L (ref 135–145)
Total Bilirubin: 0.5 mg/dL (ref 0.3–1.2)
Total Protein: 6.9 g/dL (ref 6.5–8.1)

## 2017-02-21 LAB — PROTEIN, URINE, RANDOM: Total Protein, Urine: <6 mg/dL

## 2017-02-21 MED ORDER — SODIUM CHLORIDE 0.9 % IV SOLN
Freq: Once | INTRAVENOUS | Status: AC
Start: 2017-02-21 — End: 2017-02-21
  Filled 2017-02-21: qty 1000

## 2017-02-21 MED ORDER — SODIUM CHLORIDE 0.9 % IV SOLN: 5.0000 mg/kg | Freq: Once | INTRAVENOUS | Status: DC

## 2017-02-21 MED ORDER — LEUCOVORIN CALCIUM INJECTION 350 MG
700.0000 mg | Freq: Once | INTRAVENOUS | Status: AC
  Administered 2017-02-21: 700 mg via INTRAVENOUS
  Filled 2017-02-21: qty 35

## 2017-02-21 MED ORDER — SODIUM CHLORIDE 0.9 % IV SOLN
2400.0000 mg/m2 | INTRAVENOUS | Status: DC
  Administered 2017-02-21: 4100 mg via INTRAVENOUS
  Filled 2017-02-21: qty 82

## 2017-02-21 MED ORDER — IRINOTECAN HCL CHEMO INJECTION 100 MG/5ML
180.0000 mg/m2 | Freq: Once | INTRAVENOUS | Status: AC
  Administered 2017-02-21: 300 mg via INTRAVENOUS
  Filled 2017-02-21: qty 15

## 2017-02-21 MED ORDER — HEPARIN SOD (PORK) LOCK FLUSH 100 UNIT/ML IV SOLN: 500.0000 [IU] | Freq: Once | INTRAVENOUS | Status: DC

## 2017-02-21 MED ORDER — FLUOROURACIL CHEMO INJECTION 2.5 GM/50ML
400.0000 mg/m2 | Freq: Once | INTRAVENOUS | Status: AC
  Administered 2017-02-21: 700 mg via INTRAVENOUS
  Filled 2017-02-21: qty 14

## 2017-02-21 MED ORDER — SODIUM CHLORIDE 0.9 % IV SOLN
Freq: Once | INTRAVENOUS | Status: AC
  Administered 2017-02-21: 11:00:00 via INTRAVENOUS
  Filled 2017-02-21: qty 1000

## 2017-02-21 MED ORDER — PALONOSETRON HCL INJECTION 0.25 MG/5ML
0.2500 mg | Freq: Once | INTRAVENOUS | Status: AC
  Administered 2017-02-21: 0.25 mg via INTRAVENOUS
  Filled 2017-02-21: qty 5

## 2017-02-21 MED ORDER — ATROPINE SULFATE 1 MG/ML IJ SOLN
0.5000 mg | Freq: Once | INTRAMUSCULAR | Status: AC | PRN
  Administered 2017-02-21: 0.5 mg via INTRAVENOUS
  Filled 2017-02-21: qty 1

## 2017-02-21 MED ORDER — SODIUM CHLORIDE 0.9% FLUSH
10.0000 mL | Freq: Once | INTRAVENOUS | Status: AC
  Administered 2017-02-21: 10 mL via INTRAVENOUS
  Filled 2017-02-21: qty 10

## 2017-02-21 MED ORDER — DEXAMETHASONE SODIUM PHOSPHATE 10 MG/ML IJ SOLN
10.0000 mg | Freq: Once | INTRAMUSCULAR | Status: AC
  Administered 2017-02-21: 10 mg via INTRAVENOUS
  Filled 2017-02-21: qty 1

## 2017-02-21 MED ORDER — SODIUM CHLORIDE 0.9 % IV SOLN
300.0000 mg | Freq: Once | INTRAVENOUS | Status: AC
  Administered 2017-02-21: 300 mg via INTRAVENOUS
  Filled 2017-02-21: qty 12

## 2017-02-21 NOTE — Telephone Encounter (Signed)
Pt states that the first magic mouthwash was different from the first one and the first one was paid for with insurance and the second one was not paid for.  She wanted me to check on it and let her know. When I called the pharmacy both times it ws not covered by insurance and pt would pay out of pocket and the price changes due to whatever ingredients is in costs.  Spoke to pharmacist and she said it was never paid for and pt ok with that and she wants to have refill. I asked Grayland Ormond and he is agreeable to refills and I called it in and gave 2 refills and increased to 240 ml bottle.

## 2017-02-21 NOTE — Progress Notes (Signed)
No concerns, ready for treatment

## 2017-02-23 ENCOUNTER — Inpatient Hospital Stay: Payer: BLUE CROSS/BLUE SHIELD

## 2017-02-23 DIAGNOSIS — C801 Malignant (primary) neoplasm, unspecified: Secondary | ICD-10-CM

## 2017-02-23 DIAGNOSIS — C786 Secondary malignant neoplasm of retroperitoneum and peritoneum: Secondary | ICD-10-CM

## 2017-02-23 MED ORDER — SODIUM CHLORIDE 0.9% FLUSH
10.0000 mL | INTRAVENOUS | Status: DC | PRN
  Administered 2017-02-23: 10 mL via INTRAVENOUS
  Filled 2017-02-23: qty 10

## 2017-02-23 MED ORDER — PEGFILGRASTIM INJECTION 6 MG/0.6ML ~~LOC~~
6.0000 mg | PREFILLED_SYRINGE | Freq: Once | SUBCUTANEOUS | Status: AC
  Administered 2017-02-23: 6 mg via SUBCUTANEOUS
  Filled 2017-02-23: qty 0.6

## 2017-02-23 MED ORDER — HEPARIN SOD (PORK) LOCK FLUSH 100 UNIT/ML IV SOLN
500.0000 [IU] | Freq: Once | INTRAVENOUS | Status: AC
  Administered 2017-02-23: 500 [IU] via INTRAVENOUS
  Filled 2017-02-23: qty 5

## 2017-03-03 NOTE — Progress Notes (Signed)
[place]  Telephone:(336) [phone] Fax:(336) [phone]  ID: EMILY MASSAR OB: 02/20/1961  MR#: [id number]  JFH#:[id number]  Patient Care Team: Valerie Roys, DO as PCP - General (Family Medicine) Clent Jacks, RN as Registered Nurse Garnetta Buddy, MD as Consulting Physician (Internal Medicine)  CHIEF COMPLAINT: Peritoneal carcinomatosis, metastatic adenocarcinoma of likely lower GI primary.  INTERVAL HISTORY: Patient returns to clinic today for further evaluation and consideration of cycle 16 of FOLFIRI plus Avastin. She continues to have mild bloating but denies any pain.  She has a mild lesion on the underside of her tongue noticed by her dentist that does not cause her any problems. She has no neurological complaints. She denies any recent fevers or illnesses.  She has no chest pain or shortness of breath.  She denies and nausea, vomiting, constipation, or diarrhea. She has no melena or hematochezia. She has no urinary complains.  Patient offers no further specific complaints.  REVIEW OF SYSTEMS:   Review of Systems  Constitutional: Negative for fever, malaise/fatigue and weight loss.  Respiratory: Negative.  Negative for cough and shortness of breath.   Cardiovascular: Negative.  Negative for chest pain.  Gastrointestinal: Negative for abdominal pain, blood in stool, constipation, diarrhea, melena and vomiting.       Positive for abdominal bloating  Genitourinary: Negative.   Musculoskeletal: Negative.   Neurological: Positive for sensory change. Negative for weakness.  Psychiatric/Behavioral: The patient is not nervous/anxious.     As per HPI. Otherwise, a complete review of systems is negative.  PAST MEDICAL HISTORY: Past Medical History:  Diagnosis Date  . Acid reflux   . Allergic rhinitis   . Anxiety   . Arthritis    left knee  . Cancer ([place])   . Cervical spondylosis   . CTS (carpal tunnel syndrome)    Right  . Diverticulosis   .  HSV-2 (herpes simplex virus 2) infection   . Hyperlipidemia   . Ovarian failure   . Rosacea   . Wears contact lenses     PAST SURGICAL HISTORY: Past Surgical History:  Procedure Laterality Date  . ANTERIOR CRUCIATE LIGAMENT REPAIR Left   . CESAREAN SECTION    . COLONOSCOPY WITH PROPOFOL N/A 01/29/2016   Procedure: COLONOSCOPY WITH PROPOFOL;  Surgeon: Lucilla Lame, MD;  Location: [place];  Service: Endoscopy;  Laterality: N/A;  . ESOPHAGOGASTRODUODENOSCOPY (EGD) WITH PROPOFOL N/A 01/29/2016   Procedure: ESOPHAGOGASTRODUODENOSCOPY (EGD) WITH PROPOFOL;  Surgeon: Lucilla Lame, MD;  Location: [place];  Service: Endoscopy;  Laterality: N/A;  . PERIPHERAL VASCULAR CATHETERIZATION N/A 02/10/2016   Procedure: Glori Luis Cath Insertion;  Surgeon: Algernon Huxley, MD;  Location: [place] [facility];  Service: Cardiovascular;  Laterality: N/A;    FAMILY HISTORY: Unknown.  Patient is adopted.     ADVANCED DIRECTIVES:    HEALTH MAINTENANCE: Social History  Substance Use Topics  . Smoking status: Former Research scientist (life sciences)  . Smokeless tobacco: Never Used     Comment: Quit in her 50's  . Alcohol use 0.0 oz/week     Comment: 1 drink/mo     Colonoscopy:  PAP:  Bone density:  Lipid panel:  No Known Allergies  Current Outpatient Prescriptions  Medication Sig Dispense Refill  . acyclovir (ZOVIRAX) 800 MG tablet Take 1 tablet (800 mg total) by mouth daily. (Patient taking differently: Take 800 mg by mouth daily as needed. ) 90 tablet 1  . Diphenhyd-Hydrocort-Nystatin (FIRST-DUKES MOUTHWASH) SUSP Use as directed 5 mLs in the  mouth or throat 4 (four) times daily as needed. 1 Bottle 2  . escitalopram (LEXAPRO) 10 MG tablet TAKE 1 TABLET (10 MG TOTAL) BY MOUTH DAILY. 90 tablet 1  . lidocaine-prilocaine (EMLA) cream Apply 1 application topically as needed. 30 g 3  . ondansetron (ZOFRAN) 8 MG tablet Take by mouth every 8 (eight) hours as needed for nausea or vomiting.    . pantoprazole (PROTONIX)  40 MG tablet Take 1 tablet (40 mg total) by mouth daily. 30 tablet 2  . prochlorperazine (COMPAZINE) 10 MG tablet Take 10 mg by mouth every 6 (six) hours as needed for nausea or vomiting.     No current facility-administered medications for this visit.    Facility-Administered Medications Ordered in Other Visits  Medication Dose Route Frequency Provider Last Rate Last Dose  . heparin lock flush 100 unit/mL  500 Units Intravenous Once Lloyd Huger, MD      . sodium chloride flush (NS) 0.9 % injection 10 mL  10 mL Intravenous PRN Lloyd Huger, MD   10 mL at 08/16/16 0925  . sodium chloride flush (NS) 0.9 % injection 10 mL  10 mL Intracatheter PRN Lloyd Huger, MD   10 mL at 01/26/17 1343    OBJECTIVE: Vitals:   03/07/17 1017  BP: 112/74  Pulse: 76  Resp: 18  Temp: (!) 97.4 F (36.3 C)     Body mass index is 24.4 kg/m.    ECOG FS:0 - Asymptomatic  General: Well-developed, well-nourished, no acute distress. Eyes: Pink conjunctiva, anicteric sclera. HEENT: Subcentimeter lesion near base of tongue, no ulceration or erythema. Lungs: Clear to auscultation bilaterally. Heart: Regular rate and rhythm. No rubs, murmurs, or gallops. Abdomen: Mildly distended, nontender.  Musculoskeletal: No edema, cyanosis, or clubbing. Neuro: Alert, answering all questions appropriately. Cranial nerves grossly intact. Skin: No rashes or petechiae noted. Psych: Normal affect.   LAB RESULTS:  Lab Results  Component Value Date   NA 135 03/07/2017   K 3.9 03/07/2017   CL 103 03/07/2017   CO2 25 03/07/2017   GLUCOSE 101 (H) 03/07/2017   BUN 13 03/07/2017   CREATININE 0.73 03/07/2017   CALCIUM 8.8 (L) 03/07/2017   PROT 6.9 03/07/2017   ALBUMIN 3.9 03/07/2017   AST 25 03/07/2017   ALT 18 03/07/2017   ALKPHOS 195 (H) 03/07/2017   BILITOT 0.4 03/07/2017   GFRNONAA >60 03/07/2017   GFRAA >60 03/07/2017    Lab Results  Component Value Date   WBC 9.3 03/07/2017   NEUTROABS  6.8 (H) 03/07/2017   HGB 11.9 (L) 03/07/2017   HCT 35.0 03/07/2017   MCV 91.0 03/07/2017   PLT 112 (L) 03/07/2017   Lab Results  Component Value Date   CA125 86.6 (H) 03/29/2016     STUDIES: No results found.  ASSESSMENT: Peritoneal carcinomatosis, metastatic adenocarcinoma of likely lower GI primary.  PLAN:    1. Peritoneal carcinomatosis: Pathology results reviewed, immunohistochemistry suggested lower GI primary. CT scan results from [facility] on January 11, 2017 essentially revealed stable disease. Clinically she is also improving since she requires paracentesis less frequently. Her last paracentesis was on December 23, 2016. Of note, patient had a normal colonoscopy on October 16, 2014 in [place], [place]. Because of suspected progression of disease, patient's treatment was switched to FOLFIRI. Patient also had consultation at [facility] who determined that no surgical or intraperitoneal chemotherapy would be beneficial. Foundation 1 testing did not reveal any actionable mutations. Proceed with cycle 16  of FOLFIRI plus Avastin today. Return to clinic in 2 days for pump removal and Neulasta, then in 2 weeks for consideration of cycle 17. Will reimage after cycle 18 in October 2018. 2: Genetic testing: Negative. 3. Anemia: Mild, monitor. 4. Thrombocytopenia: Mild, monitor. 5. Neutropenia: Neulasta with the remainder treatments as above. 6. Peripheral neuropathy: Resolved. 7. Ascites: Patient had a paracentesis on December 23, 2016 which removed 700 mL of fluid. Her last paracentesis prior to this was on September 16, 2016 when 3.5 L of fluid was removed. 8. Tongue lesion: Unclear etiology. No intervention needed at this time. Monitor.  Patient expressed understanding and was in agreement with this plan. She also understands that She can call clinic at any time with any questions, concerns, or complaints.     Lloyd Huger, MD 03/07/17 10:46 AM

## 2017-03-07 ENCOUNTER — Inpatient Hospital Stay (HOSPITAL_BASED_OUTPATIENT_CLINIC_OR_DEPARTMENT_OTHER): Payer: BLUE CROSS/BLUE SHIELD | Admitting: Oncology

## 2017-03-07 ENCOUNTER — Inpatient Hospital Stay: Payer: BLUE CROSS/BLUE SHIELD | Attending: Oncology

## 2017-03-07 ENCOUNTER — Inpatient Hospital Stay: Payer: BLUE CROSS/BLUE SHIELD

## 2017-03-07 VITALS — BP 112/74 | HR 76 | Temp 97.4°F | Resp 18 | Wt 139.5 lb

## 2017-03-07 DIAGNOSIS — E785 Hyperlipidemia, unspecified: Secondary | ICD-10-CM

## 2017-03-07 DIAGNOSIS — D701 Agranulocytosis secondary to cancer chemotherapy: Secondary | ICD-10-CM

## 2017-03-07 DIAGNOSIS — K1370 Unspecified lesions of oral mucosa: Secondary | ICD-10-CM | POA: Diagnosis not present

## 2017-03-07 DIAGNOSIS — K219 Gastro-esophageal reflux disease without esophagitis: Secondary | ICD-10-CM

## 2017-03-07 DIAGNOSIS — C801 Malignant (primary) neoplasm, unspecified: Secondary | ICD-10-CM

## 2017-03-07 DIAGNOSIS — C786 Secondary malignant neoplasm of retroperitoneum and peritoneum: Secondary | ICD-10-CM

## 2017-03-07 DIAGNOSIS — D696 Thrombocytopenia, unspecified: Secondary | ICD-10-CM | POA: Insufficient documentation

## 2017-03-07 DIAGNOSIS — D649 Anemia, unspecified: Secondary | ICD-10-CM | POA: Insufficient documentation

## 2017-03-07 DIAGNOSIS — R188 Other ascites: Secondary | ICD-10-CM

## 2017-03-07 DIAGNOSIS — M1990 Unspecified osteoarthritis, unspecified site: Secondary | ICD-10-CM | POA: Insufficient documentation

## 2017-03-07 DIAGNOSIS — Z79899 Other long term (current) drug therapy: Secondary | ICD-10-CM

## 2017-03-07 DIAGNOSIS — T451X5S Adverse effect of antineoplastic and immunosuppressive drugs, sequela: Secondary | ICD-10-CM

## 2017-03-07 DIAGNOSIS — Z87891 Personal history of nicotine dependence: Secondary | ICD-10-CM

## 2017-03-07 DIAGNOSIS — Z5112 Encounter for antineoplastic immunotherapy: Secondary | ICD-10-CM | POA: Insufficient documentation

## 2017-03-07 LAB — COMPREHENSIVE METABOLIC PANEL
ALK PHOS: 195 U/L — AB (ref 38–126)
ALT: 18 U/L (ref 14–54)
AST: 25 U/L (ref 15–41)
Albumin: 3.9 g/dL (ref 3.5–5.0)
Anion gap: 7 (ref 5–15)
BILIRUBIN TOTAL: 0.4 mg/dL (ref 0.3–1.2)
BUN: 13 mg/dL (ref 6–20)
CALCIUM: 8.8 mg/dL — AB (ref 8.9–10.3)
CHLORIDE: 103 mmol/L (ref 101–111)
CO2: 25 mmol/L (ref 22–32)
CREATININE: 0.73 mg/dL (ref 0.44–1.00)
Glucose, Bld: 101 mg/dL — ABNORMAL HIGH (ref 65–99)
Potassium: 3.9 mmol/L (ref 3.5–5.1)
SODIUM: 135 mmol/L (ref 135–145)
TOTAL PROTEIN: 6.9 g/dL (ref 6.5–8.1)

## 2017-03-07 LAB — CBC WITH DIFFERENTIAL/PLATELET
BASOS ABS: 0 10*3/uL (ref 0–0.1)
Basophils Relative: 0 %
EOS ABS: 0 10*3/uL (ref 0–0.7)
EOS PCT: 1 %
HCT: 35 % (ref 35.0–47.0)
Hemoglobin: 11.9 g/dL — ABNORMAL LOW (ref 12.0–16.0)
LYMPHS PCT: 18 %
Lymphs Abs: 1.7 10*3/uL (ref 1.0–3.6)
MCH: 30.9 pg (ref 26.0–34.0)
MCHC: 34 g/dL (ref 32.0–36.0)
MCV: 91 fL (ref 80.0–100.0)
MONO ABS: 0.7 10*3/uL (ref 0.2–0.9)
Monocytes Relative: 7 %
Neutro Abs: 6.8 10*3/uL — ABNORMAL HIGH (ref 1.4–6.5)
Neutrophils Relative %: 74 %
PLATELETS: 112 10*3/uL — AB (ref 150–440)
RBC: 3.85 MIL/uL (ref 3.80–5.20)
RDW: 17.3 % — AB (ref 11.5–14.5)
WBC: 9.3 10*3/uL (ref 3.6–11.0)

## 2017-03-07 LAB — PROTEIN, URINE, RANDOM: Total Protein, Urine: <6 mg/dL

## 2017-03-07 MED ORDER — DEXAMETHASONE SODIUM PHOSPHATE 10 MG/ML IJ SOLN
10.0000 mg | Freq: Once | INTRAMUSCULAR | Status: AC
  Administered 2017-03-07: 10 mg via INTRAVENOUS
  Filled 2017-03-07: qty 1

## 2017-03-07 MED ORDER — LEUCOVORIN CALCIUM INJECTION 350 MG
700.0000 mg | Freq: Once | INTRAVENOUS | Status: AC
  Administered 2017-03-07: 700 mg via INTRAVENOUS
  Filled 2017-03-07: qty 25

## 2017-03-07 MED ORDER — IRINOTECAN HCL CHEMO INJECTION 100 MG/5ML
180.0000 mg/m2 | Freq: Once | INTRAVENOUS | Status: AC
  Administered 2017-03-07: 300 mg via INTRAVENOUS
  Filled 2017-03-07: qty 15

## 2017-03-07 MED ORDER — SODIUM CHLORIDE 0.9 % IV SOLN
300.0000 mg | Freq: Once | INTRAVENOUS | Status: AC
  Administered 2017-03-07: 300 mg via INTRAVENOUS
  Filled 2017-03-07: qty 12

## 2017-03-07 MED ORDER — PALONOSETRON HCL INJECTION 0.25 MG/5ML
0.2500 mg | Freq: Once | INTRAVENOUS | Status: AC
  Administered 2017-03-07: 0.25 mg via INTRAVENOUS
  Filled 2017-03-07: qty 5

## 2017-03-07 MED ORDER — SODIUM CHLORIDE 0.9 % IV SOLN
2400.0000 mg/m2 | INTRAVENOUS | Status: DC
  Administered 2017-03-07: 4100 mg via INTRAVENOUS
  Filled 2017-03-07: qty 82

## 2017-03-07 MED ORDER — SODIUM CHLORIDE 0.9 % IV SOLN
Freq: Once | INTRAVENOUS | Status: AC
  Administered 2017-03-07: 11:00:00 via INTRAVENOUS
  Filled 2017-03-07: qty 1000

## 2017-03-07 MED ORDER — ATROPINE SULFATE 1 MG/ML IJ SOLN
0.5000 mg | Freq: Once | INTRAMUSCULAR | Status: AC | PRN
  Administered 2017-03-07: 0.5 mg via INTRAVENOUS
  Filled 2017-03-07: qty 1

## 2017-03-07 MED ORDER — FLUOROURACIL CHEMO INJECTION 2.5 GM/50ML
400.0000 mg/m2 | Freq: Once | INTRAVENOUS | Status: AC
  Administered 2017-03-07: 700 mg via INTRAVENOUS
  Filled 2017-03-07: qty 14

## 2017-03-09 ENCOUNTER — Inpatient Hospital Stay: Payer: BLUE CROSS/BLUE SHIELD

## 2017-03-09 VITALS — BP 109/73 | HR 73 | Temp 96.6°F | Resp 18

## 2017-03-09 DIAGNOSIS — C786 Secondary malignant neoplasm of retroperitoneum and peritoneum: Secondary | ICD-10-CM

## 2017-03-09 DIAGNOSIS — C801 Malignant (primary) neoplasm, unspecified: Principal | ICD-10-CM

## 2017-03-09 MED ORDER — HEPARIN SOD (PORK) LOCK FLUSH 100 UNIT/ML IV SOLN
500.0000 [IU] | Freq: Once | INTRAVENOUS | Status: AC | PRN
  Administered 2017-03-09: 500 [IU]
  Filled 2017-03-09: qty 5

## 2017-03-09 MED ORDER — PEGFILGRASTIM INJECTION 6 MG/0.6ML ~~LOC~~
6.0000 mg | PREFILLED_SYRINGE | Freq: Once | SUBCUTANEOUS | Status: AC
  Administered 2017-03-09: 6 mg via SUBCUTANEOUS
  Filled 2017-03-09: qty 0.6

## 2017-03-09 MED ORDER — SODIUM CHLORIDE 0.9% FLUSH
10.0000 mL | INTRAVENOUS | Status: DC | PRN
  Administered 2017-03-09: 10 mL
  Filled 2017-03-09: qty 10

## 2017-03-14 ENCOUNTER — Telehealth: Payer: Self-pay | Admitting: *Deleted

## 2017-03-14 NOTE — Telephone Encounter (Signed)
OK per VO Dr Grayland Ormond. Patient informed

## 2017-03-14 NOTE — Telephone Encounter (Signed)
Patient called stating that she strained her back and is asking if she can take extra strength tylenol. Please advise

## 2017-03-18 NOTE — Progress Notes (Signed)
Valerie Horton  [phone] [phone]  ID: RAYYA YAGI OB: 03/28/1961  MR#: [id number]  FIE#:[id number]  Patient Care Team: Valerie Roys, DO as PCP - General (Family Medicine) Clent Jacks, RN as Registered Nurse Garnetta Buddy, MD as Consulting Physician (Internal Medicine)  CHIEF COMPLAINT: Peritoneal carcinomatosis, metastatic adenocarcinoma of likely lower GI primary.  INTERVAL HISTORY: Patient returns to clinic today for further evaluation and consideration of cycle 17 of FOLFIRI plus Avastin. She continues to have mild bloating, but denies any pain.  The lesion on the underside of her tongue is unchanged. She has no neurological complaints. She denies any recent fevers or illnesses.  She has no chest pain or shortness of breath.  She denies and nausea, vomiting, constipation, or diarrhea. She has no melena or hematochezia. She has no urinary complains.  Patient offers no further specific complaints.  REVIEW OF SYSTEMS:   Review of Systems  Constitutional: Negative for fever, malaise/fatigue and weight loss.  Respiratory: Negative.  Negative for cough and shortness of breath.   Cardiovascular: Negative.  Negative for chest pain.  Gastrointestinal: Negative for abdominal pain, blood in stool, constipation, diarrhea, melena and vomiting.       Positive for abdominal bloating  Genitourinary: Negative.   Musculoskeletal: Negative.   Neurological: Positive for sensory change. Negative for weakness.  Psychiatric/Behavioral: The patient is not nervous/anxious.     As per HPI. Otherwise, a complete review of systems is negative.  PAST MEDICAL HISTORY: Past Medical History:  Diagnosis Date  . Acid reflux   . Allergic rhinitis   . Anxiety   . Arthritis    left knee  . Cancer ([place])   . Cervical spondylosis   . CTS (carpal tunnel syndrome)    Right  . Diverticulosis   . HSV-2 (herpes simplex virus 2) infection   . Hyperlipidemia    . Ovarian failure   . Rosacea   . Wears contact lenses     PAST SURGICAL HISTORY: Past Surgical History:  Procedure Laterality Date  . ANTERIOR CRUCIATE LIGAMENT REPAIR Left   . CESAREAN SECTION    . COLONOSCOPY WITH PROPOFOL N/A 01/29/2016   Procedure: COLONOSCOPY WITH PROPOFOL;  Surgeon: Lucilla Lame, MD;  Location: [facility];  Service: Endoscopy;  Laterality: N/A;  . ESOPHAGOGASTRODUODENOSCOPY (EGD) WITH PROPOFOL N/A 01/29/2016   Procedure: ESOPHAGOGASTRODUODENOSCOPY (EGD) WITH PROPOFOL;  Surgeon: Lucilla Lame, MD;  Location: [facility];  Service: Endoscopy;  Laterality: N/A;  . PERIPHERAL VASCULAR CATHETERIZATION N/A 02/10/2016   Procedure: Glori Luis Cath Insertion;  Surgeon: Algernon Huxley, MD;  Location: [facility];  Service: Cardiovascular;  Laterality: N/A;    FAMILY HISTORY: Unknown.  Patient is adopted.     ADVANCED DIRECTIVES:    HEALTH MAINTENANCE: Social History  Substance Use Topics  . Smoking status: Former Research scientist (life sciences)  . Smokeless tobacco: Never Used     Comment: Quit in her 58's  . Alcohol use 0.0 oz/week     Comment: 1 drink/mo     Colonoscopy:  PAP:  Bone density:  Lipid panel:  No Known Allergies  Current Outpatient Prescriptions  Medication Sig Dispense Refill  . Diphenhyd-Hydrocort-Nystatin (FIRST-DUKES MOUTHWASH) SUSP Use as directed 5 mLs in the mouth or throat 4 (four) times daily as needed. 1 Bottle 2  . lidocaine-prilocaine (EMLA) cream Apply 1 application topically as needed. 30 g 3  . ondansetron (ZOFRAN) 8 MG tablet Take by mouth every 8 (eight) hours as needed for  nausea or vomiting.    . pantoprazole (PROTONIX) 40 MG tablet Take 1 tablet (40 mg total) by mouth daily. 30 tablet 2  . prochlorperazine (COMPAZINE) 10 MG tablet Take 10 mg by mouth every 6 (six) hours as needed for nausea or vomiting.    Marland Kitchen acyclovir (ZOVIRAX) 800 MG tablet TAKE 1 TABLET BY MOUTH EVERY DAY 90 tablet 1  . escitalopram (LEXAPRO) 10 MG tablet TAKE 1  TABLET BY MOUTH EVERY DAY 90 tablet 1   No current facility-administered medications for this visit.    Facility-Administered Medications Ordered in Other Visits  Medication Dose Route Frequency Provider Last Rate Last Dose  . heparin lock flush 100 unit/mL  500 Units Intravenous Once Lloyd Huger, MD      . sodium chloride flush (NS) 0.9 % injection 10 mL  10 mL Intravenous PRN Lloyd Huger, MD   10 mL at 08/16/16 0925  . sodium chloride flush (NS) 0.9 % injection 10 mL  10 mL Intracatheter PRN Lloyd Huger, MD   10 mL at 01/26/17 1343    OBJECTIVE: Vitals:   03/21/17 0919  BP: 117/66  Pulse: 82  Resp: 18  Temp: (!) 97.5 F (36.4 C)     Body mass index is 24.23 kg/m.    ECOG FS:0 - Asymptomatic  General: Well-developed, well-nourished, no acute distress. Eyes: Pink conjunctiva, anicteric sclera. HEENT: Subcentimeter lesion near base of tongue, no ulceration or erythema. Lungs: Clear to auscultation bilaterally. Heart: Regular rate and rhythm. No rubs, murmurs, or gallops. Abdomen: Mildly distended, nontender.  Musculoskeletal: No edema, cyanosis, or clubbing. Neuro: Alert, answering all questions appropriately. Cranial nerves grossly intact. Skin: No rashes or petechiae noted. Psych: Normal affect.   LAB RESULTS:  Lab Results  Component Value Date   NA 136 03/21/2017   K 3.9 03/21/2017   CL 103 03/21/2017   CO2 26 03/21/2017   GLUCOSE 100 (H) 03/21/2017   BUN 13 03/21/2017   CREATININE 0.85 03/21/2017   CALCIUM 8.9 03/21/2017   PROT 6.9 03/21/2017   ALBUMIN 4.0 03/21/2017   AST 27 03/21/2017   ALT 19 03/21/2017   ALKPHOS 197 (H) 03/21/2017   BILITOT 0.5 03/21/2017   GFRNONAA >60 03/21/2017   GFRAA >60 03/21/2017    Lab Results  Component Value Date   WBC 9.6 03/21/2017   NEUTROABS 7.0 (H) 03/21/2017   HGB 12.1 03/21/2017   HCT 35.5 03/21/2017   MCV 92.2 03/21/2017   PLT 119 (L) 03/21/2017   Lab Results  Component Value Date    CA125 86.6 (H) 03/29/2016     STUDIES: No results found.  ASSESSMENT: Peritoneal carcinomatosis, metastatic adenocarcinoma of likely lower GI primary.  PLAN:    1. Peritoneal carcinomatosis: Pathology results reviewed, immunohistochemistry suggested lower GI primary. CT scan results from M.D. [facility] on January 11, 2017 essentially revealed stable disease. Clinically she is also improving since she requires paracentesis less frequently. Her last paracentesis was on December 23, 2016. Of note, patient had a normal colonoscopy on October 16, 2014 in [place], [place]. Because of suspected progression of disease, patient's treatment was switched to FOLFIRI. Patient also had consultation at [facility] who determined that no surgical or intraperitoneal chemotherapy would be beneficial. Foundation 1 testing did not reveal any actionable mutations. Proceed with cycle 17 of FOLFIRI plus Avastin today. Return to clinic in 2 days for pump removal and Neulasta, then in 2 weeks for consideration of cycle 18. Will reimage after cycle 18  in October 2018. 2: Genetic testing: Negative. 3. Anemia: Mild, monitor. 4. Thrombocytopenia: Mild, monitor. 5. Neutropenia: Neulasta with the remainder treatments as above. 6. Peripheral neuropathy: Resolved. 7. Ascites: Patient had a paracentesis on December 23, 2016 which removed 700 mL of fluid. Her last paracentesis prior to this was on September 16, 2016 when 3.5 L of fluid was removed. 8. Tongue lesion: Unclear etiology. No intervention needed at this time. Monitor.  Patient expressed understanding and was in agreement with this plan. She also understands that She can call clinic at any time with any questions, concerns, or complaints.     Lloyd Huger, MD 03/22/17 1:53 PM

## 2017-03-21 ENCOUNTER — Inpatient Hospital Stay: Payer: BLUE CROSS/BLUE SHIELD

## 2017-03-21 ENCOUNTER — Inpatient Hospital Stay (HOSPITAL_BASED_OUTPATIENT_CLINIC_OR_DEPARTMENT_OTHER): Payer: BLUE CROSS/BLUE SHIELD | Admitting: Oncology

## 2017-03-21 VITALS — BP 117/66 | HR 82 | Temp 97.5°F | Resp 18 | Wt 138.5 lb

## 2017-03-21 DIAGNOSIS — C801 Malignant (primary) neoplasm, unspecified: Secondary | ICD-10-CM | POA: Diagnosis not present

## 2017-03-21 DIAGNOSIS — K219 Gastro-esophageal reflux disease without esophagitis: Secondary | ICD-10-CM

## 2017-03-21 DIAGNOSIS — C786 Secondary malignant neoplasm of retroperitoneum and peritoneum: Secondary | ICD-10-CM

## 2017-03-21 DIAGNOSIS — D701 Agranulocytosis secondary to cancer chemotherapy: Secondary | ICD-10-CM | POA: Diagnosis not present

## 2017-03-21 DIAGNOSIS — D649 Anemia, unspecified: Secondary | ICD-10-CM | POA: Diagnosis not present

## 2017-03-21 DIAGNOSIS — D696 Thrombocytopenia, unspecified: Secondary | ICD-10-CM | POA: Diagnosis not present

## 2017-03-21 DIAGNOSIS — Z79899 Other long term (current) drug therapy: Secondary | ICD-10-CM

## 2017-03-21 DIAGNOSIS — T451X5S Adverse effect of antineoplastic and immunosuppressive drugs, sequela: Secondary | ICD-10-CM | POA: Diagnosis not present

## 2017-03-21 DIAGNOSIS — K1370 Unspecified lesions of oral mucosa: Secondary | ICD-10-CM | POA: Diagnosis not present

## 2017-03-21 DIAGNOSIS — E785 Hyperlipidemia, unspecified: Secondary | ICD-10-CM | POA: Diagnosis not present

## 2017-03-21 DIAGNOSIS — M1990 Unspecified osteoarthritis, unspecified site: Secondary | ICD-10-CM | POA: Diagnosis not present

## 2017-03-21 DIAGNOSIS — R188 Other ascites: Secondary | ICD-10-CM

## 2017-03-21 DIAGNOSIS — Z87891 Personal history of nicotine dependence: Secondary | ICD-10-CM

## 2017-03-21 LAB — CBC WITH DIFFERENTIAL/PLATELET
Basophils Absolute: 0.1 10*3/uL (ref 0–0.1)
Basophils Relative: 1 %
Eosinophils Absolute: 0.1 10*3/uL (ref 0–0.7)
Eosinophils Relative: 1 %
HEMATOCRIT: 35.5 % (ref 35.0–47.0)
HEMOGLOBIN: 12.1 g/dL (ref 12.0–16.0)
Lymphocytes Relative: 17 %
Lymphs Abs: 1.6 10*3/uL (ref 1.0–3.6)
MCH: 31.5 pg (ref 26.0–34.0)
MCHC: 34.1 g/dL (ref 32.0–36.0)
MCV: 92.2 fL (ref 80.0–100.0)
MONOS PCT: 8 %
Monocytes Absolute: 0.7 10*3/uL (ref 0.2–0.9)
NEUTROS ABS: 7 10*3/uL — AB (ref 1.4–6.5)
NEUTROS PCT: 73 %
Platelets: 119 10*3/uL — ABNORMAL LOW (ref 150–440)
RBC: 3.85 MIL/uL (ref 3.80–5.20)
RDW: 17.5 % — ABNORMAL HIGH (ref 11.5–14.5)
WBC: 9.6 10*3/uL (ref 3.6–11.0)

## 2017-03-21 LAB — COMPREHENSIVE METABOLIC PANEL
ALBUMIN: 4 g/dL (ref 3.5–5.0)
ALK PHOS: 197 U/L — AB (ref 38–126)
ALT: 19 U/L (ref 14–54)
AST: 27 U/L (ref 15–41)
Anion gap: 7 (ref 5–15)
BILIRUBIN TOTAL: 0.5 mg/dL (ref 0.3–1.2)
BUN: 13 mg/dL (ref 6–20)
CALCIUM: 8.9 mg/dL (ref 8.9–10.3)
CHLORIDE: 103 mmol/L (ref 101–111)
CO2: 26 mmol/L (ref 22–32)
CREATININE: 0.85 mg/dL (ref 0.44–1.00)
GFR calc Af Amer: >60 mL/min (ref >60)
GFR calc non Af Amer: >60 mL/min (ref >60)
GLUCOSE: 100 mg/dL — AB (ref 65–99)
Potassium: 3.9 mmol/L (ref 3.5–5.1)
Sodium: 136 mmol/L (ref 135–145)
Total Protein: 6.9 g/dL (ref 6.5–8.1)

## 2017-03-21 LAB — PROTEIN, URINE, RANDOM: Total Protein, Urine: <6 mg/dL

## 2017-03-21 MED ORDER — DEXAMETHASONE SODIUM PHOSPHATE 10 MG/ML IJ SOLN
10.0000 mg | Freq: Once | INTRAMUSCULAR | Status: AC
  Administered 2017-03-21: 10 mg via INTRAVENOUS
  Filled 2017-03-21: qty 1

## 2017-03-21 MED ORDER — HEPARIN SOD (PORK) LOCK FLUSH 100 UNIT/ML IV SOLN: 500.0000 [IU] | Freq: Once | INTRAVENOUS | Status: DC

## 2017-03-21 MED ORDER — FLUOROURACIL CHEMO INJECTION 2.5 GM/50ML
400.0000 mg/m2 | Freq: Once | INTRAVENOUS | Status: AC
  Administered 2017-03-21: 700 mg via INTRAVENOUS
  Filled 2017-03-21: qty 14

## 2017-03-21 MED ORDER — PALONOSETRON HCL INJECTION 0.25 MG/5ML
0.2500 mg | Freq: Once | INTRAVENOUS | Status: AC
  Administered 2017-03-21: 0.25 mg via INTRAVENOUS
  Filled 2017-03-21: qty 5

## 2017-03-21 MED ORDER — SODIUM CHLORIDE 0.9% FLUSH
10.0000 mL | INTRAVENOUS | Status: DC | PRN
  Administered 2017-03-21: 10 mL via INTRAVENOUS
  Filled 2017-03-21: qty 10

## 2017-03-21 MED ORDER — SODIUM CHLORIDE 0.9 % IV SOLN
300.0000 mg | Freq: Once | INTRAVENOUS | Status: AC
  Administered 2017-03-21: 300 mg via INTRAVENOUS
  Filled 2017-03-21: qty 12

## 2017-03-21 MED ORDER — LEUCOVORIN CALCIUM INJECTION 350 MG
700.0000 mg | Freq: Once | INTRAVENOUS | Status: AC
  Administered 2017-03-21: 700 mg via INTRAVENOUS
  Filled 2017-03-21: qty 25

## 2017-03-21 MED ORDER — IRINOTECAN HCL CHEMO INJECTION 100 MG/5ML
180.0000 mg/m2 | Freq: Once | INTRAVENOUS | Status: AC
  Administered 2017-03-21: 300 mg via INTRAVENOUS
  Filled 2017-03-21: qty 15

## 2017-03-21 MED ORDER — SODIUM CHLORIDE 0.9 % IV SOLN
2400.0000 mg/m2 | INTRAVENOUS | Status: DC
  Administered 2017-03-21: 4100 mg via INTRAVENOUS
  Filled 2017-03-21: qty 82

## 2017-03-21 MED ORDER — SODIUM CHLORIDE 0.9 % IV SOLN
Freq: Once | INTRAVENOUS | Status: DC
  Filled 2017-03-21: qty 1000

## 2017-03-21 MED ORDER — SODIUM CHLORIDE 0.9 % IV SOLN
Freq: Once | INTRAVENOUS | Status: AC
  Administered 2017-03-21: 10:00:00 via INTRAVENOUS
  Filled 2017-03-21: qty 1000

## 2017-03-21 MED ORDER — ATROPINE SULFATE 1 MG/ML IJ SOLN
0.5000 mg | Freq: Once | INTRAMUSCULAR | Status: AC | PRN
  Administered 2017-03-21: 0.5 mg via INTRAVENOUS
  Filled 2017-03-21: qty 1

## 2017-03-22 ENCOUNTER — Other Ambulatory Visit: Payer: Self-pay | Admitting: Family Medicine

## 2017-03-23 ENCOUNTER — Inpatient Hospital Stay: Payer: BLUE CROSS/BLUE SHIELD

## 2017-03-23 VITALS — BP 106/54 | HR 73 | Resp 20

## 2017-03-23 DIAGNOSIS — C786 Secondary malignant neoplasm of retroperitoneum and peritoneum: Secondary | ICD-10-CM | POA: Diagnosis not present

## 2017-03-23 DIAGNOSIS — C801 Malignant (primary) neoplasm, unspecified: Principal | ICD-10-CM

## 2017-03-23 MED ORDER — PEGFILGRASTIM INJECTION 6 MG/0.6ML ~~LOC~~
6.0000 mg | PREFILLED_SYRINGE | Freq: Once | SUBCUTANEOUS | Status: AC
  Administered 2017-03-23: 6 mg via SUBCUTANEOUS
  Filled 2017-03-23: qty 0.6

## 2017-03-23 MED ORDER — SODIUM CHLORIDE 0.9% FLUSH
10.0000 mL | INTRAVENOUS | Status: DC | PRN
  Administered 2017-03-23: 10 mL
  Filled 2017-03-23: qty 10

## 2017-03-23 MED ORDER — HEPARIN SOD (PORK) LOCK FLUSH 100 UNIT/ML IV SOLN
500.0000 [IU] | Freq: Once | INTRAVENOUS | Status: AC | PRN
  Administered 2017-03-23: 500 [IU]
  Filled 2017-03-23: qty 5

## 2017-03-28 ENCOUNTER — Encounter: Payer: Self-pay | Admitting: Family Medicine

## 2017-03-28 ENCOUNTER — Ambulatory Visit (INDEPENDENT_AMBULATORY_CARE_PROVIDER_SITE_OTHER): Payer: BLUE CROSS/BLUE SHIELD | Admitting: Family Medicine

## 2017-03-28 VITALS — BP 118/70 | HR 79 | Temp 99.4°F | Wt 134.2 lb

## 2017-03-28 DIAGNOSIS — F411 Generalized anxiety disorder: Secondary | ICD-10-CM

## 2017-03-28 NOTE — Assessment & Plan Note (Signed)
Stable continue current regimen. Continue to monitor. Call with any concerns. Recheck 6 months at physical.

## 2017-03-28 NOTE — Progress Notes (Signed)
BP 118/70 (BP Location: Left Arm, Patient Position: Sitting, Cuff Size: Normal)   Pulse 79   Temp 99.4 F (37.4 C)   Wt 134 lb 4 oz (60.9 kg)   SpO2 100%   BMI 23.48 kg/m    Subjective:    Patient ID: Valerie Horton, female    DOB: 06-22-1961, 56 y.o.   MRN: [id number]  HPI: Valerie Horton is a 56 y.o. female  Chief Complaint  Patient presents with  . Anxiety  . Hyperlipidemia   Has been stable. Has been doing chemo and has been stable. Not needing to be drained nearly as often. This makes her much happier. Doing well. No real concerns.   ANXIETY/STRESS Duration:stable Anxious mood: no  Excessive worrying: no Irritability: no  Sweating: no Nausea: no Palpitations:no Hyperventilation: no Panic attacks: no Agoraphobia: no  Obscessions/compulsions: no Depressed mood: no GAD 7 : Generalized Anxiety Score 03/28/2017 09/20/2016  Nervous, Anxious, on Edge 0 0  Control/stop worrying 0 0  Worry too much - different things 0 0  Trouble relaxing 0 0  Restless 0 0  Easily annoyed or irritable - 0  Afraid - awful might happen 0 0  Total GAD 7 Score - 0  Anxiety Difficulty Not difficult at all Not difficult at all   Anhedonia: no Weight changes: no Insomnia: no   Hypersomnia: no Fatigue/loss of energy: no Feelings of worthlessness: no Feelings of guilt: no Impaired concentration/indecisiveness: no Suicidal ideations: no  Crying spells: no Recent Stressors/Life Changes: no  Relevant past medical, surgical, family and social history reviewed and updated as indicated. Interim medical history since our last visit reviewed. Allergies and medications reviewed and updated.  Review of Systems  Constitutional: Negative.   Respiratory: Negative.   Cardiovascular: Negative.   Psychiatric/Behavioral: Negative.     Per HPI unless specifically indicated above     Objective:    BP 118/70 (BP Location: Left Arm, Patient Position: Sitting, Cuff Size: Normal)   Pulse 79    Temp 99.4 F (37.4 C)   Wt 134 lb 4 oz (60.9 kg)   SpO2 100%   BMI 23.48 kg/m   Wt Readings from Last 3 Encounters:  03/28/17 134 lb 4 oz (60.9 kg)  03/21/17 138 lb 8 oz (62.8 kg)  03/07/17 139 lb 8 oz (63.3 kg)    Physical Exam  Constitutional: She is oriented to person, place, and time. She appears well-developed and well-nourished. No distress.  HENT:  Head: Normocephalic and atraumatic.  Right Ear: Hearing normal.  Left Ear: Hearing normal.  Nose: Nose normal.  Eyes: Conjunctivae and lids are normal. Right eye exhibits no discharge. Left eye exhibits no discharge. No scleral icterus.  Cardiovascular: Normal rate, regular rhythm, normal heart sounds and intact distal pulses.  Exam reveals no gallop and no friction rub.   No murmur heard. Pulmonary/Chest: Effort normal and breath sounds normal. No respiratory distress. She has no wheezes. She has no rales. She exhibits no tenderness.  Musculoskeletal: Normal range of motion.  Neurological: She is alert and oriented to person, place, and time.  Skin: Skin is warm, dry and intact. No rash noted. She is not diaphoretic. No erythema. No pallor.  Psychiatric: She has a normal mood and affect. Her speech is normal and behavior is normal. Judgment and thought content normal. Cognition and memory are normal.  Nursing note and vitals reviewed.   Results for orders placed or performed in visit on 03/21/17  CBC with Differential  Result Value Ref Range   WBC 9.6 3.6 - 11.0 K/uL   RBC 3.85 3.80 - 5.20 MIL/uL   Hemoglobin 12.1 12.0 - 16.0 g/dL   HCT 35.5 35.0 - 47.0 %   MCV 92.2 80.0 - 100.0 fL   MCH 31.5 26.0 - 34.0 pg   MCHC 34.1 32.0 - 36.0 g/dL   RDW 17.5 (H) 11.5 - 14.5 %   Platelets 119 (L) 150 - 440 K/uL   Neutrophils Relative % 73 %   Neutro Abs 7.0 (H) 1.4 - 6.5 K/uL   Lymphocytes Relative 17 %   Lymphs Abs 1.6 1.0 - 3.6 K/uL   Monocytes Relative 8 %   Monocytes Absolute 0.7 0.2 - 0.9 K/uL   Eosinophils Relative 1 %    Eosinophils Absolute 0.1 0 - 0.7 K/uL   Basophils Relative 1 %   Basophils Absolute 0.1 0 - 0.1 K/uL  Comprehensive metabolic panel  Result Value Ref Range   Sodium 136 135 - 145 mmol/L   Potassium 3.9 3.5 - 5.1 mmol/L   Chloride 103 101 - 111 mmol/L   CO2 26 22 - 32 mmol/L   Glucose, Bld 100 (H) 65 - 99 mg/dL   BUN 13 6 - 20 mg/dL   Creatinine, Ser 0.85 0.44 - 1.00 mg/dL   Calcium 8.9 8.9 - 10.3 mg/dL   Total Protein 6.9 6.5 - 8.1 g/dL   Albumin 4.0 3.5 - 5.0 g/dL   AST 27 15 - 41 U/L   ALT 19 14 - 54 U/L   Alkaline Phosphatase 197 (H) 38 - 126 U/L   Total Bilirubin 0.5 0.3 - 1.2 mg/dL   GFR calc non Af Amer >60 >60 mL/min   GFR calc Af Amer >60 >60 mL/min   Anion gap 7 5 - 15  Protein, urine, random  Result Value Ref Range   Total Protein, Urine <6 mg/dL      Assessment & Plan:   Problem List Items Addressed This Visit      Other   Anxiety disorder - Primary    Stable continue current regimen. Continue to monitor. Call with any concerns. Recheck 6 months at physical.          Follow up plan: Return in about 6 months (around 09/25/2017) for Physical.

## 2017-03-31 ENCOUNTER — Telehealth: Payer: Self-pay

## 2017-03-31 DIAGNOSIS — C801 Malignant (primary) neoplasm, unspecified: Secondary | ICD-10-CM

## 2017-03-31 DIAGNOSIS — Z7189 Other specified counseling: Secondary | ICD-10-CM

## 2017-03-31 DIAGNOSIS — C786 Secondary malignant neoplasm of retroperitoneum and peritoneum: Secondary | ICD-10-CM

## 2017-03-31 MED ORDER — FIRST-DUKES MOUTHWASH MT SUSP: 5.0000 mL | Freq: Four times a day (QID) | OROMUCOSAL | 2 refills | Status: DC | PRN

## 2017-03-31 MED ORDER — PANTOPRAZOLE SODIUM 40 MG PO TBEC: 40.0000 mg | DELAYED_RELEASE_TABLET | Freq: Every day | ORAL | 2 refills | Status: DC

## 2017-03-31 NOTE — Telephone Encounter (Signed)
Patient called requesting refill on Protonix and Dukes Mouthwash.  Order approved by Rulon Abide, NP.  Patient notified.

## 2017-04-02 NOTE — Progress Notes (Signed)
[facility]  [phone] [phone]) [phone]  ID: Valerie Horton OB: 01/24/1961  MR#: [id number]  AOZ#:[id number]  Patient Care Team: Valerie Roys, DO as PCP - General (Family Medicine) Clent Jacks, RN as Registered Nurse Garnetta Buddy, MD as Consulting Physician (Internal Medicine)  CHIEF COMPLAINT: Peritoneal carcinomatosis, metastatic adenocarcinoma of likely lower GI primary.  INTERVAL HISTORY: Patient returns to clinic today for further evaluation and consideration of cycle 18 of FOLFIRI plus Avastin. She continues to have mild bloating, but denies any pain.  The lesion on the underside of her tongue is unchanged. She has no neurological complaints. She denies any recent fevers or illnesses.  She has no chest pain or shortness of breath.  She denies and nausea, vomiting, constipation, or diarrhea. She has no melena or hematochezia. She has no urinary complains.  Patient offers no further specific complaints.  REVIEW OF SYSTEMS:   Review of Systems  Constitutional: Negative for fever, malaise/fatigue and weight loss.  Respiratory: Negative.  Negative for cough and shortness of breath.   Cardiovascular: Negative.  Negative for chest pain.  Gastrointestinal: Negative for abdominal pain, blood in stool, constipation, diarrhea, melena and vomiting.       Positive for abdominal bloating  Genitourinary: Negative.   Musculoskeletal: Negative.   Neurological: Positive for sensory change. Negative for weakness.  Psychiatric/Behavioral: The patient is not nervous/anxious.     As per HPI. Otherwise, a complete review of systems is negative.  PAST MEDICAL HISTORY: Past Medical History:  Diagnosis Date  . Acid reflux   . Allergic rhinitis   . Anxiety   . Arthritis    left knee  . Cancer ([place])   . Cervical spondylosis   . CTS (carpal tunnel syndrome)    Right  . Diverticulosis   . HSV-2 (herpes simplex virus 2) infection   . Hyperlipidemia    . Ovarian failure   . Rosacea   . Wears contact lenses     PAST SURGICAL HISTORY: Past Surgical History:  Procedure Laterality Date  . ANTERIOR CRUCIATE LIGAMENT REPAIR Left   . CESAREAN SECTION    . COLONOSCOPY WITH PROPOFOL N/A 01/29/2016   Procedure: COLONOSCOPY WITH PROPOFOL;  Surgeon: Lucilla Lame, MD;  Location: [facility];  Service: Endoscopy;  Laterality: N/A;  . ESOPHAGOGASTRODUODENOSCOPY (EGD) WITH PROPOFOL N/A 01/29/2016   Procedure: ESOPHAGOGASTRODUODENOSCOPY (EGD) WITH PROPOFOL;  Surgeon: Lucilla Lame, MD;  Location: [facility];  Service: Endoscopy;  Laterality: N/A;  . PERIPHERAL VASCULAR CATHETERIZATION N/A 02/10/2016   Procedure: Glori Luis Cath Insertion;  Surgeon: Algernon Huxley, MD;  Location: [facility];  Service: Cardiovascular;  Laterality: N/A;    FAMILY HISTORY: Unknown.  Patient is adopted.     ADVANCED DIRECTIVES:    HEALTH MAINTENANCE: Social History  Substance Use Topics  . Smoking status: Former Research scientist (life sciences)  . Smokeless tobacco: Never Used     Comment: Quit in her 40's  . Alcohol use 0.0 oz/week     Comment: 1 drink/mo     Colonoscopy:  PAP:  Bone density:  Lipid panel:  No Known Allergies  Current Outpatient Prescriptions  Medication Sig Dispense Refill  . acyclovir (ZOVIRAX) 800 MG tablet TAKE 1 TABLET BY MOUTH EVERY DAY 90 tablet 1  . Diphenhyd-Hydrocort-Nystatin (FIRST-DUKES MOUTHWASH) SUSP Use as directed 5 mLs in the mouth or throat 4 (four) times daily as needed. 1 Bottle 2  . escitalopram (LEXAPRO) 10 MG tablet TAKE 1 TABLET BY MOUTH EVERY DAY  90 tablet 1  . gabapentin (NEURONTIN) 300 MG capsule Take 300 mg by mouth at bedtime.    . lidocaine-prilocaine (EMLA) cream Apply 1 application topically as needed. 30 g 3  . ondansetron (ZOFRAN) 8 MG tablet Take by mouth every 8 (eight) hours as needed for nausea or vomiting.    . pantoprazole (PROTONIX) 40 MG tablet Take 1 tablet (40 mg total) by mouth daily. 30 tablet 2  .  prochlorperazine (COMPAZINE) 10 MG tablet Take 10 mg by mouth every 6 (six) hours as needed for nausea or vomiting.     No current facility-administered medications for this visit.    Facility-Administered Medications Ordered in Other Visits  Medication Dose Route Frequency Provider Last Rate Last Dose  . 0.9 %  sodium chloride infusion   Intravenous Once Lloyd Huger, MD      . atropine injection 0.5 mg  0.5 mg Intravenous Once PRN Lloyd Huger, MD      . bevacizumab (AVASTIN) 300 mg in sodium chloride 0.9 % 100 mL chemo infusion  300 mg Intravenous Once Lloyd Huger, MD      . dexamethasone (DECADRON) injection 10 mg  10 mg Intravenous Once Lloyd Huger, MD      . dextrose 5 % solution   Intravenous Continuous Lloyd Huger, MD      . fluorouracil (ADRUCIL) 4,100 mg in sodium chloride 0.9 % 68 mL chemo infusion  2,400 mg/m2 (Treatment Plan Recorded) Intravenous 1 day or 1 dose Lloyd Huger, MD      . fluorouracil (ADRUCIL) chemo injection 700 mg  400 mg/m2 (Treatment Plan Recorded) Intravenous Once Lloyd Huger, MD      . heparin lock flush 100 unit/mL  500 Units Intravenous Once Lloyd Huger, MD      . heparin lock flush 100 unit/mL  500 Units Intracatheter Once PRN Lloyd Huger, MD      . irinotecan (CAMPTOSAR) 300 mg in dextrose 5 % 500 mL chemo infusion  180 mg/m2 (Treatment Plan Recorded) Intravenous Once Lloyd Huger, MD      . leucovorin 700 mg in dextrose 5 % 250 mL infusion  700 mg Intravenous Once Lloyd Huger, MD      . palonosetron (ALOXI) injection 0.25 mg  0.25 mg Intravenous Once Lloyd Huger, MD      . sodium chloride flush (NS) 0.9 % injection 10 mL  10 mL Intravenous PRN Lloyd Huger, MD   10 mL at 08/16/16 0925  . sodium chloride flush (NS) 0.9 % injection 10 mL  10 mL Intracatheter PRN Lloyd Huger, MD   10 mL at 01/26/17 1343  . sodium chloride flush (NS) 0.9 % injection 10 mL   10 mL Intracatheter PRN Lloyd Huger, MD        OBJECTIVE: Vitals:   04/04/17 1042  BP: 109/60  Pulse: 76  Temp: (!) 96.5 F (35.8 C)     Body mass index is 24.24 kg/m.    ECOG FS:0 - Asymptomatic  General: Well-developed, well-nourished, no acute distress. Eyes: Pink conjunctiva, anicteric sclera. HEENT: Subcentimeter lesion near base of tongue, no ulceration or erythema. Lungs: Clear to auscultation bilaterally. Heart: Regular rate and rhythm. No rubs, murmurs, or gallops. Abdomen: Mildly distended, nontender.  Musculoskeletal: No edema, cyanosis, or clubbing. Neuro: Alert, answering all questions appropriately. Cranial nerves grossly intact. Skin: No rashes or petechiae noted. Psych: Normal affect.   LAB RESULTS:  Lab  Results  Component Value Date   NA 135 04/04/2017   K 3.9 04/04/2017   CL 103 04/04/2017   CO2 26 04/04/2017   GLUCOSE 101 (H) 04/04/2017   BUN 12 04/04/2017   CREATININE 0.72 04/04/2017   CALCIUM 9.1 04/04/2017   PROT 6.8 04/04/2017   ALBUMIN 3.9 04/04/2017   AST 24 04/04/2017   ALT 15 04/04/2017   ALKPHOS 189 (H) 04/04/2017   BILITOT 0.5 04/04/2017   GFRNONAA >60 04/04/2017   GFRAA >60 04/04/2017    Lab Results  Component Value Date   WBC 10.0 04/04/2017   NEUTROABS 7.6 (H) 04/04/2017   HGB 12.0 04/04/2017   HCT 35.3 04/04/2017   MCV 92.2 04/04/2017   PLT 98 (L) 04/04/2017   Lab Results  Component Value Date   CA125 86.6 (H) 03/29/2016     STUDIES: No results found.  ASSESSMENT: Peritoneal carcinomatosis, metastatic adenocarcinoma of likely lower GI primary.  PLAN:    1. Peritoneal carcinomatosis: Pathology results reviewed, immunohistochemistry suggested lower GI primary. CT scan results from [facility] on January 11, 2017 essentially revealed stable disease. Clinically she is also improving since she requires paracentesis less frequently. Her last paracentesis was on December 23, 2016. Of note, patient had a normal  colonoscopy on October 16, 2014 in [place], [place]. Because of suspected progression of disease, patient's treatment was switched to FOLFIRI. Patient also had consultation at [facility] who determined that no surgical or intraperitoneal chemotherapy would be beneficial. Foundation 1 testing did not reveal any actionable mutations. Proceed with cycle 18 of FOLFIRI plus Avastin today. Return to clinic in 2 days for pump removal and Neulasta, then in 3 weeks with repeat imaging and consideration of cycle 19. We will consider a chemotherapy holiday if imaging is significantly improved.  2: Genetic testing: Negative. 3. Anemia: Mild, monitor. 4. Thrombocytopenia: Mild, monitor. Proceed with treatment as above. Return to clinic in 3 weeks. 5. Neutropenia: Neulasta with the remainder treatments as above. 6. Peripheral neuropathy: Resolved. 7. Ascites: Patient had a paracentesis on December 23, 2016 which removed 700 mL of fluid. Her last paracentesis prior to this was on September 16, 2016 when 3.5 L of fluid was removed. 8. Tongue lesion: Unclear etiology. No intervention needed at this time. Monitor.  Patient expressed understanding and was in agreement with this plan. She also understands that She can call clinic at any time with any questions, concerns, or complaints.     Lloyd Huger, MD 04/04/17 11:37 AM

## 2017-04-04 ENCOUNTER — Inpatient Hospital Stay: Payer: BLUE CROSS/BLUE SHIELD

## 2017-04-04 ENCOUNTER — Inpatient Hospital Stay: Payer: BLUE CROSS/BLUE SHIELD | Attending: Oncology | Admitting: Oncology

## 2017-04-04 ENCOUNTER — Encounter: Payer: Self-pay | Admitting: Oncology

## 2017-04-04 VITALS — BP 109/60 | HR 76 | Temp 96.5°F | Wt 138.6 lb

## 2017-04-04 DIAGNOSIS — D701 Agranulocytosis secondary to cancer chemotherapy: Secondary | ICD-10-CM | POA: Insufficient documentation

## 2017-04-04 DIAGNOSIS — T451X5S Adverse effect of antineoplastic and immunosuppressive drugs, sequela: Secondary | ICD-10-CM | POA: Insufficient documentation

## 2017-04-04 DIAGNOSIS — C801 Malignant (primary) neoplasm, unspecified: Secondary | ICD-10-CM | POA: Insufficient documentation

## 2017-04-04 DIAGNOSIS — Z87891 Personal history of nicotine dependence: Secondary | ICD-10-CM | POA: Diagnosis not present

## 2017-04-04 DIAGNOSIS — C786 Secondary malignant neoplasm of retroperitoneum and peritoneum: Secondary | ICD-10-CM

## 2017-04-04 DIAGNOSIS — D649 Anemia, unspecified: Secondary | ICD-10-CM | POA: Diagnosis not present

## 2017-04-04 DIAGNOSIS — Z5111 Encounter for antineoplastic chemotherapy: Secondary | ICD-10-CM | POA: Diagnosis not present

## 2017-04-04 DIAGNOSIS — M479 Spondylosis, unspecified: Secondary | ICD-10-CM | POA: Diagnosis not present

## 2017-04-04 DIAGNOSIS — R188 Other ascites: Secondary | ICD-10-CM

## 2017-04-04 DIAGNOSIS — E785 Hyperlipidemia, unspecified: Secondary | ICD-10-CM | POA: Diagnosis not present

## 2017-04-04 DIAGNOSIS — D696 Thrombocytopenia, unspecified: Secondary | ICD-10-CM

## 2017-04-04 DIAGNOSIS — M1712 Unilateral primary osteoarthritis, left knee: Secondary | ICD-10-CM

## 2017-04-04 DIAGNOSIS — Z79899 Other long term (current) drug therapy: Secondary | ICD-10-CM | POA: Diagnosis not present

## 2017-04-04 DIAGNOSIS — Z23 Encounter for immunization: Secondary | ICD-10-CM | POA: Diagnosis not present

## 2017-04-04 DIAGNOSIS — M5136 Other intervertebral disc degeneration, lumbar region: Secondary | ICD-10-CM | POA: Diagnosis not present

## 2017-04-04 DIAGNOSIS — Z5112 Encounter for antineoplastic immunotherapy: Secondary | ICD-10-CM | POA: Diagnosis not present

## 2017-04-04 DIAGNOSIS — K219 Gastro-esophageal reflux disease without esophagitis: Secondary | ICD-10-CM | POA: Diagnosis not present

## 2017-04-04 DIAGNOSIS — J9811 Atelectasis: Secondary | ICD-10-CM | POA: Insufficient documentation

## 2017-04-04 DIAGNOSIS — R911 Solitary pulmonary nodule: Secondary | ICD-10-CM | POA: Insufficient documentation

## 2017-04-04 LAB — COMPREHENSIVE METABOLIC PANEL
ALBUMIN: 3.9 g/dL (ref 3.5–5.0)
ALT: 15 U/L (ref 14–54)
ANION GAP: 6 (ref 5–15)
AST: 24 U/L (ref 15–41)
Alkaline Phosphatase: 189 U/L — ABNORMAL HIGH (ref 38–126)
BUN: 12 mg/dL (ref 6–20)
CALCIUM: 9.1 mg/dL (ref 8.9–10.3)
CHLORIDE: 103 mmol/L (ref 101–111)
CO2: 26 mmol/L (ref 22–32)
CREATININE: 0.72 mg/dL (ref 0.44–1.00)
GFR calc Af Amer: >60 mL/min (ref >60)
Glucose, Bld: 101 mg/dL — ABNORMAL HIGH (ref 65–99)
Potassium: 3.9 mmol/L (ref 3.5–5.1)
SODIUM: 135 mmol/L (ref 135–145)
Total Bilirubin: 0.5 mg/dL (ref 0.3–1.2)
Total Protein: 6.8 g/dL (ref 6.5–8.1)

## 2017-04-04 LAB — CBC WITH DIFFERENTIAL/PLATELET
Basophils Absolute: 0 10*3/uL (ref 0–0.1)
Basophils Relative: 0 %
EOS ABS: 0 10*3/uL (ref 0–0.7)
EOS PCT: 0 %
HCT: 35.3 % (ref 35.0–47.0)
Hemoglobin: 12 g/dL (ref 12.0–16.0)
LYMPHS ABS: 1.7 10*3/uL (ref 1.0–3.6)
Lymphocytes Relative: 17 %
MCH: 31.4 pg (ref 26.0–34.0)
MCHC: 34.1 g/dL (ref 32.0–36.0)
MCV: 92.2 fL (ref 80.0–100.0)
MONO ABS: 0.7 10*3/uL (ref 0.2–0.9)
Monocytes Relative: 7 %
NEUTROS PCT: 76 %
Neutro Abs: 7.6 10*3/uL — ABNORMAL HIGH (ref 1.4–6.5)
PLATELETS: 98 10*3/uL — AB (ref 150–440)
RBC: 3.83 MIL/uL (ref 3.80–5.20)
RDW: 17.7 % — AB (ref 11.5–14.5)
WBC: 10 10*3/uL (ref 3.6–11.0)

## 2017-04-04 LAB — PROTEIN, URINE, RANDOM: Total Protein, Urine: <6 mg/dL

## 2017-04-04 MED ORDER — DEXTROSE 5 % IV SOLN
INTRAVENOUS | Status: DC
Start: 2017-04-04 — End: 2017-04-04
  Administered 2017-04-04: 12:00:00 via INTRAVENOUS
  Filled 2017-04-04: qty 1000

## 2017-04-04 MED ORDER — SODIUM CHLORIDE 0.9% FLUSH
10.0000 mL | INTRAVENOUS | Status: DC | PRN
  Filled 2017-04-04: qty 10

## 2017-04-04 MED ORDER — BEVACIZUMAB CHEMO INJECTION 400 MG/16ML
300.0000 mg | Freq: Once | INTRAVENOUS | Status: AC
  Administered 2017-04-04: 300 mg via INTRAVENOUS
  Filled 2017-04-04: qty 12

## 2017-04-04 MED ORDER — HEPARIN SOD (PORK) LOCK FLUSH 100 UNIT/ML IV SOLN
500.0000 [IU] | Freq: Once | INTRAVENOUS | Status: DC | PRN
  Filled 2017-04-04: qty 5

## 2017-04-04 MED ORDER — FLUOROURACIL CHEMO INJECTION 5 GM/100ML
2400.0000 mg/m2 | INTRAVENOUS | Status: DC
  Administered 2017-04-04: 4100 mg via INTRAVENOUS
  Filled 2017-04-04: qty 82

## 2017-04-04 MED ORDER — PALONOSETRON HCL INJECTION 0.25 MG/5ML
0.2500 mg | Freq: Once | INTRAVENOUS | Status: AC
  Administered 2017-04-04: 0.25 mg via INTRAVENOUS
  Filled 2017-04-04: qty 5

## 2017-04-04 MED ORDER — FLUOROURACIL CHEMO INJECTION 2.5 GM/50ML
400.0000 mg/m2 | Freq: Once | INTRAVENOUS | Status: AC
  Administered 2017-04-04: 700 mg via INTRAVENOUS
  Filled 2017-04-04: qty 14

## 2017-04-04 MED ORDER — IRINOTECAN HCL CHEMO INJECTION 100 MG/5ML
180.0000 mg/m2 | Freq: Once | INTRAVENOUS | Status: AC
  Administered 2017-04-04: 300 mg via INTRAVENOUS
  Filled 2017-04-04: qty 15

## 2017-04-04 MED ORDER — SODIUM CHLORIDE 0.9 % IV SOLN
Freq: Once | INTRAVENOUS | Status: AC
  Administered 2017-04-04: 12:00:00 via INTRAVENOUS
  Filled 2017-04-04: qty 1000

## 2017-04-04 MED ORDER — DEXAMETHASONE SODIUM PHOSPHATE 10 MG/ML IJ SOLN
10.0000 mg | Freq: Once | INTRAMUSCULAR | Status: AC
  Administered 2017-04-04: 10 mg via INTRAVENOUS
  Filled 2017-04-04: qty 1

## 2017-04-04 MED ORDER — ATROPINE SULFATE 1 MG/ML IJ SOLN
0.5000 mg | Freq: Once | INTRAMUSCULAR | Status: AC | PRN
  Administered 2017-04-04: 0.5 mg via INTRAVENOUS
  Filled 2017-04-04: qty 1

## 2017-04-04 MED ORDER — LEUCOVORIN CALCIUM INJECTION 350 MG
700.0000 mg | Freq: Once | INTRAVENOUS | Status: AC
  Administered 2017-04-04: 700 mg via INTRAVENOUS
  Filled 2017-04-04: qty 35

## 2017-04-04 NOTE — Progress Notes (Signed)
Dr. Grayland Ormond gave order to treatment patient today with plts. 98.

## 2017-04-04 NOTE — Progress Notes (Signed)
Plt 98, ok to treat per Dr Grayland Ormond.

## 2017-04-06 ENCOUNTER — Inpatient Hospital Stay: Payer: BLUE CROSS/BLUE SHIELD

## 2017-04-06 VITALS — BP 102/53 | HR 72 | Temp 96.6°F | Resp 18

## 2017-04-06 DIAGNOSIS — C786 Secondary malignant neoplasm of retroperitoneum and peritoneum: Secondary | ICD-10-CM | POA: Diagnosis not present

## 2017-04-06 DIAGNOSIS — C801 Malignant (primary) neoplasm, unspecified: Principal | ICD-10-CM

## 2017-04-06 MED ORDER — HEPARIN SOD (PORK) LOCK FLUSH 100 UNIT/ML IV SOLN
500.0000 [IU] | Freq: Once | INTRAVENOUS | Status: AC | PRN
  Administered 2017-04-06: 500 [IU]
  Filled 2017-04-06: qty 5

## 2017-04-06 MED ORDER — SODIUM CHLORIDE 0.9% FLUSH
10.0000 mL | INTRAVENOUS | Status: DC | PRN
  Administered 2017-04-06: 10 mL
  Filled 2017-04-06: qty 10

## 2017-04-06 MED ORDER — PEGFILGRASTIM INJECTION 6 MG/0.6ML ~~LOC~~
6.0000 mg | PREFILLED_SYRINGE | Freq: Once | SUBCUTANEOUS | Status: AC
  Administered 2017-04-06: 6 mg via SUBCUTANEOUS
  Filled 2017-04-06: qty 0.6

## 2017-04-22 NOTE — Progress Notes (Signed)
[place]  [phone] [phone]) [phone]  ID: Valerie Horton OB: 02-05-1961  MR#: [id number]  KCL#:[id number]  Patient Care Team: Valerie Roys, DO as PCP - General (Family Medicine) Clent Jacks, RN as Registered Nurse Garnetta Buddy, MD as Consulting Physician (Internal Medicine)  CHIEF COMPLAINT: Peritoneal carcinomatosis, metastatic adenocarcinoma of likely lower GI primary.  INTERVAL HISTORY: Patient returns to clinic today for further evaluation and discussion of her imaging results. She currently feels well and is asymptomatic. Her mouth lesions have completely resolved. She has no neurological complaints. She denies any recent fevers or illnesses.  She has no chest pain or shortness of breath.  She denies and nausea, vomiting, constipation, or diarrhea. She does not complain of bloating or abdominal pain today. She has no melena or hematochezia. She has no urinary complaints.  Patient offers no specific complaints.  REVIEW OF SYSTEMS:   Review of Systems  Constitutional: Negative for fever, malaise/fatigue and weight loss.  Respiratory: Negative.  Negative for cough and shortness of breath.   Cardiovascular: Negative.  Negative for chest pain.  Gastrointestinal: Negative for abdominal pain, blood in stool, constipation, diarrhea, melena and vomiting.  Genitourinary: Negative.   Musculoskeletal: Negative.   Neurological: Negative.  Negative for sensory change and weakness.  Psychiatric/Behavioral: Negative.  The patient is not nervous/anxious.     As per HPI. Otherwise, a complete review of systems is negative.  PAST MEDICAL HISTORY: Past Medical History:  Diagnosis Date  . Acid reflux   . Allergic rhinitis   . Anxiety   . Arthritis    left knee  . Cervical spondylosis   . CTS (carpal tunnel syndrome)    Right  . Diverticulosis   . HSV-2 (herpes simplex virus 2) infection   . Hyperlipidemia   . Ovarian failure   .  Peritoneal carcinomatosis ([place]) 01/2016   chemo tx's.   . Rosacea   . Wears contact lenses     PAST SURGICAL HISTORY: Past Surgical History:  Procedure Laterality Date  . ANTERIOR CRUCIATE LIGAMENT REPAIR Left   . CESAREAN SECTION    . COLONOSCOPY WITH PROPOFOL N/A 01/29/2016   Procedure: COLONOSCOPY WITH PROPOFOL;  Surgeon: Lucilla Lame, MD;  Location: [facility];  Service: Endoscopy;  Laterality: N/A;  . ESOPHAGOGASTRODUODENOSCOPY (EGD) WITH PROPOFOL N/A 01/29/2016   Procedure: ESOPHAGOGASTRODUODENOSCOPY (EGD) WITH PROPOFOL;  Surgeon: Lucilla Lame, MD;  Location: [facility];  Service: Endoscopy;  Laterality: N/A;  . PERIPHERAL VASCULAR CATHETERIZATION N/A 02/10/2016   Procedure: Glori Luis Cath Insertion;  Surgeon: Algernon Huxley, MD;  Location: [facility];  Service: Cardiovascular;  Laterality: N/A;    FAMILY HISTORY: Unknown.  Patient is adopted.     ADVANCED DIRECTIVES:    HEALTH MAINTENANCE: Social History  Substance Use Topics  . Smoking status: Former Research scientist (life sciences)  . Smokeless tobacco: Never Used     Comment: Quit in her 36's  . Alcohol use 0.0 oz/week     Comment: 1 drink/mo     Colonoscopy:  PAP:  Bone density:  Lipid panel:  No Known Allergies  Current Outpatient Prescriptions  Medication Sig Dispense Refill  . acyclovir (ZOVIRAX) 800 MG tablet TAKE 1 TABLET BY MOUTH EVERY DAY 90 tablet 1  . Diphenhyd-Hydrocort-Nystatin (FIRST-DUKES MOUTHWASH) SUSP Use as directed 5 mLs in the mouth or throat 4 (four) times daily as needed. 1 Bottle 2  . escitalopram (LEXAPRO) 10 MG tablet TAKE 1 TABLET BY MOUTH EVERY DAY 90 tablet 1  .  gabapentin (NEURONTIN) 300 MG capsule Take 300 mg by mouth at bedtime.    . lidocaine-prilocaine (EMLA) cream Apply 1 application topically as needed. 30 g 3  . ondansetron (ZOFRAN) 8 MG tablet Take by mouth every 8 (eight) hours as needed for nausea or vomiting.    . pantoprazole (PROTONIX) 40 MG tablet Take 1 tablet (40 mg total)  by mouth daily. 30 tablet 2  . prochlorperazine (COMPAZINE) 10 MG tablet Take 10 mg by mouth every 6 (six) hours as needed for nausea or vomiting.     No current facility-administered medications for this visit.    Facility-Administered Medications Ordered in Other Visits  Medication Dose Route Frequency Provider Last Rate Last Dose  . heparin lock flush 100 unit/mL  500 Units Intravenous Once Lloyd Huger, MD      . sodium chloride flush (NS) 0.9 % injection 10 mL  10 mL Intravenous PRN Lloyd Huger, MD   10 mL at 08/16/16 0925  . sodium chloride flush (NS) 0.9 % injection 10 mL  10 mL Intracatheter PRN Lloyd Huger, MD   10 mL at 01/26/17 1343    OBJECTIVE: Vitals:   04/25/17 0944  BP: 125/81  Resp: 18  Temp: (!) 97.4 F (36.3 C)     Body mass index is 24.1 kg/m.    ECOG FS:0 - Asymptomatic  General: Well-developed, well-nourished, no acute distress. Eyes: Pink conjunctiva, anicteric sclera. HEENT: clear oropharynx without ulceration or erythema. Lungs: Clear to auscultation bilaterally. Heart: Regular rate and rhythm. No rubs, murmurs, or gallops. Abdomen: Mildly distended, nontender.  Musculoskeletal: No edema, cyanosis, or clubbing. Neuro: Alert, answering all questions appropriately. Cranial nerves grossly intact. Skin: No rashes or petechiae noted. Psych: Normal affect.   LAB RESULTS:  Lab Results  Component Value Date   NA 135 04/25/2017   K 4.2 04/25/2017   CL 103 04/25/2017   CO2 22 04/25/2017   GLUCOSE 105 (H) 04/25/2017   BUN 14 04/25/2017   CREATININE 0.65 04/25/2017   CALCIUM 9.1 04/25/2017   PROT 7.2 04/25/2017   ALBUMIN 4.1 04/25/2017   AST 38 04/25/2017   ALT 29 04/25/2017   ALKPHOS 169 (H) 04/25/2017   BILITOT 0.6 04/25/2017   GFRNONAA >60 04/25/2017   GFRAA >60 04/25/2017    Lab Results  Component Value Date   WBC 8.6 04/25/2017   NEUTROABS 5.8 04/25/2017   HGB 13.2 04/25/2017   HCT 39.5 04/25/2017   MCV 93.9  04/25/2017   PLT 125 (L) 04/25/2017     STUDIES: Ct Chest W Contrast  Result Date: 04/24/2017 CLINICAL DATA:  Restaging of peritoneal carcinomatosis. Metastatic adenocarcinoma of likely lower GI primary. EXAM: CT CHEST, ABDOMEN, AND PELVIS WITH CONTRAST TECHNIQUE: Multidetector CT imaging of the chest, abdomen and pelvis was performed following the standard protocol during bolus administration of intravenous contrast. CONTRAST:  191mL ISOVUE-300 IOPAMIDOL (ISOVUE-300) INJECTION 61% COMPARISON:  Multiple exams, including CT scan dated 01/11/2017 ([facility]) and PET-CT from 10/20/2016 FINDINGS: CT CHEST FINDINGS Cardiovascular: Right Port-A-Cath tip:  Cavoatrial junction. Mediastinum/Nodes: No pathologic adenopathy identified. Lungs/Pleura: Mild biapical pleuroparenchymal scarring. Stable 4 by 2 mm right middle lobe pulmonary nodule, image 91/4. Mild dependent subsegmental atelectasis in both lungs. Stable lingular scarring. Previous right apical nodule seen on PET-CT of 10/20/2016 is currently not visible. Musculoskeletal: Mild thoracic spondylosis. Lucent lesion anteriorly in the T12 vertebral body measuring 10 mm in diameter, no change from 01/11/2016, and not hypermetabolic on PET-CT, probably a hemangioma.  CT ABDOMEN PELVIS FINDINGS Hepatobiliary: Rim calcified lesion along the dome of the right hepatic lobe measuring 8 mm in diameter on image 40/2, stable. Morphologic findings of the liver raise the possibility of early cirrhosis. Mild gallbladder wall thickening. Pancreas: Unremarkable Spleen: The spleen measures 12.4 by 5.6 by 12.1 cm (volume = 440 cm^3). The splenic vein is patent. Adrenals/Urinary Tract: Unremarkable Stomach/Bowel: There is potential wall thickening in the stomach antrum. Otherwise unremarkable. Vascular/Lymphatic: Mild atherosclerotic calcification in the right renal artery near its origin. No overt pathologic retroperitoneal or pelvic adenopathy. Reproductive:  Unremarkable Other: Mildly reduced ascites compared to the 01/11/2017 exam. Extensive omental infiltration compatible with peritoneal malignancy. Subjective increase in fatty density along the omentum suggests slightly reduced infiltration compared prior. This is difficult to quantify, although a region of interest in the left omentum currently measures -26 Hounsfield units with a similar region of interest on the prior exam measuring -7 Hounsfield units ; the lower density today suggests a lesser amount of the soft tissue density. Omental thickness on image 78/ 2 measures 4.7 cm, previously 5.4 cm. Small amount of fatty tissue in the ascites above the liver on image 50/5, greater in size than on 01/11/2017. Musculoskeletal: Mild spondylosis and degenerative disc disease at L1-2 and L2- 3. IMPRESSION: 1. Although there continues to be abnormal ascites and extensive omental infiltration by tumor, the degree of omental infiltration and amount of ascites is reduced compared to 01/11/2017. 2. Stable 3 mm in average size right middle lobe pulmonary nodule. 3. Morphologic findings in the liver suspicious for early cirrhosis. 4. Potential wall thickening in the stomach antrum, query antritis. 5. Borderline splenomegaly.  Patent splenic vein. 6. No current findings of malignancy in the chest. Electronically Signed   By: Van Clines M.D.   On: 04/24/2017 11:17   Ct Abdomen Pelvis W Contrast  Result Date: 04/24/2017 CLINICAL DATA:  Restaging of peritoneal carcinomatosis. Metastatic adenocarcinoma of likely lower GI primary. EXAM: CT CHEST, ABDOMEN, AND PELVIS WITH CONTRAST TECHNIQUE: Multidetector CT imaging of the chest, abdomen and pelvis was performed following the standard protocol during bolus administration of intravenous contrast. CONTRAST:  177mL ISOVUE-300 IOPAMIDOL (ISOVUE-300) INJECTION 61% COMPARISON:  Multiple exams, including CT scan dated 01/11/2017 ([facility]) and PET-CT from  10/20/2016 FINDINGS: CT CHEST FINDINGS Cardiovascular: Right Port-A-Cath tip:  Cavoatrial junction. Mediastinum/Nodes: No pathologic adenopathy identified. Lungs/Pleura: Mild biapical pleuroparenchymal scarring. Stable 4 by 2 mm right middle lobe pulmonary nodule, image 91/4. Mild dependent subsegmental atelectasis in both lungs. Stable lingular scarring. Previous right apical nodule seen on PET-CT of 10/20/2016 is currently not visible. Musculoskeletal: Mild thoracic spondylosis. Lucent lesion anteriorly in the T12 vertebral body measuring 10 mm in diameter, no change from 01/11/2016, and not hypermetabolic on PET-CT, probably a hemangioma. CT ABDOMEN PELVIS FINDINGS Hepatobiliary: Rim calcified lesion along the dome of the right hepatic lobe measuring 8 mm in diameter on image 40/2, stable. Morphologic findings of the liver raise the possibility of early cirrhosis. Mild gallbladder wall thickening. Pancreas: Unremarkable Spleen: The spleen measures 12.4 by 5.6 by 12.1 cm (volume = 440 cm^3). The splenic vein is patent. Adrenals/Urinary Tract: Unremarkable Stomach/Bowel: There is potential wall thickening in the stomach antrum. Otherwise unremarkable. Vascular/Lymphatic: Mild atherosclerotic calcification in the right renal artery near its origin. No overt pathologic retroperitoneal or pelvic adenopathy. Reproductive: Unremarkable Other: Mildly reduced ascites compared to the 01/11/2017 exam. Extensive omental infiltration compatible with peritoneal malignancy. Subjective increase in fatty density along the omentum suggests slightly reduced  infiltration compared prior. This is difficult to quantify, although a region of interest in the left omentum currently measures -26 Hounsfield units with a similar region of interest on the prior exam measuring -7 Hounsfield units ; the lower density today suggests a lesser amount of the soft tissue density. Omental thickness on image 78/ 2 measures 4.7 cm, previously 5.4 cm.  Small amount of fatty tissue in the ascites above the liver on image 50/5, greater in size than on 01/11/2017. Musculoskeletal: Mild spondylosis and degenerative disc disease at L1-2 and L2- 3. IMPRESSION: 1. Although there continues to be abnormal ascites and extensive omental infiltration by tumor, the degree of omental infiltration and amount of ascites is reduced compared to 01/11/2017. 2. Stable 3 mm in average size right middle lobe pulmonary nodule. 3. Morphologic findings in the liver suspicious for early cirrhosis. 4. Potential wall thickening in the stomach antrum, query antritis. 5. Borderline splenomegaly.  Patent splenic vein. 6. No current findings of malignancy in the chest. Electronically Signed   By: Van Clines M.D.   On: 04/24/2017 11:17    ASSESSMENT: Peritoneal carcinomatosis, metastatic adenocarcinoma of likely lower GI primary.  PLAN:    1. Peritoneal carcinomatosis: Pathology results reviewed, immunohistochemistry suggested lower GI primary. CT scan results from [facility] on January 11, 2017 essentially revealed stable disease. CT scan results from April 24, 2017 reviewed independently and reported as above with continued omental caking, but significantly reduced compared to July 2018. Clinically she is also improving since she requires paracentesis less frequently. Her last paracentesis was on December 23, 2016. Of note, patient had a normal colonoscopy on October 16, 2014 in [place], [place]. Because of suspected progression of disease, patient's treatment was switched to FOLFIRI. Patient also had consultation at [facility] who determined that no surgical or intraperitoneal chemotherapy would be beneficial. Foundation 1 testing did not reveal any actionable mutations. Patient has requested continued delay of treatment, therefore will return to clinic in 6 weeks with repeat imaging and further evaluation. Patient expressed understanding that she likely will require to  reinitiate treatment at some point in future which will be considered cycle 19 of FOLFIRI plus Avastin. 2: Genetic testing: Negative. 3. Anemia: Resolved, monitor. 4. Thrombocytopenia: Mild, monitor.  5. Neutropenia: Neulasta with treatments. 6. Peripheral neuropathy: Resolved. 7. Ascites: Patient had a paracentesis on December 23, 2016 which removed 700 mL of fluid. Her last paracentesis prior to this was on September 16, 2016 when 3.5 L of fluid was removed. 8. Tongue lesion: Resolved.  Patient expressed understanding and was in agreement with this plan. She also understands that She can call clinic at any time with any questions, concerns, or complaints.     Lloyd Huger, MD 04/25/17 10:29 AM

## 2017-04-24 ENCOUNTER — Ambulatory Visit
Admission: RE | Admit: 2017-04-24 | Discharge: 2017-04-24 | Disposition: A | Discharge status: Home / Self Care | Payer: BLUE CROSS/BLUE SHIELD | Source: Ambulatory Visit | Attending: Oncology | Admitting: Oncology

## 2017-04-24 DIAGNOSIS — K769 Liver disease, unspecified: Secondary | ICD-10-CM | POA: Diagnosis not present

## 2017-04-24 DIAGNOSIS — R188 Other ascites: Secondary | ICD-10-CM | POA: Insufficient documentation

## 2017-04-24 DIAGNOSIS — C786 Secondary malignant neoplasm of retroperitoneum and peritoneum: Secondary | ICD-10-CM | POA: Diagnosis present

## 2017-04-24 DIAGNOSIS — R911 Solitary pulmonary nodule: Secondary | ICD-10-CM | POA: Insufficient documentation

## 2017-04-24 DIAGNOSIS — R161 Splenomegaly, not elsewhere classified: Secondary | ICD-10-CM | POA: Diagnosis not present

## 2017-04-24 DIAGNOSIS — C801 Malignant (primary) neoplasm, unspecified: Secondary | ICD-10-CM | POA: Insufficient documentation

## 2017-04-24 HISTORY — DX: Malignant (primary) neoplasm, unspecified: C80.1

## 2017-04-24 HISTORY — DX: Secondary malignant neoplasm of retroperitoneum and peritoneum: C78.6

## 2017-04-24 MED ORDER — IOPAMIDOL (ISOVUE-300) INJECTION 61%
100.0000 mL | Freq: Once | INTRAVENOUS | Status: AC | PRN
  Administered 2017-04-24: 100 mL via INTRAVENOUS

## 2017-04-25 ENCOUNTER — Inpatient Hospital Stay: Payer: BLUE CROSS/BLUE SHIELD | Admitting: *Deleted

## 2017-04-25 ENCOUNTER — Inpatient Hospital Stay (HOSPITAL_BASED_OUTPATIENT_CLINIC_OR_DEPARTMENT_OTHER): Payer: BLUE CROSS/BLUE SHIELD | Admitting: Oncology

## 2017-04-25 ENCOUNTER — Inpatient Hospital Stay: Payer: BLUE CROSS/BLUE SHIELD

## 2017-04-25 VITALS — BP 125/81 | Temp 97.4°F | Resp 18 | Wt 137.8 lb

## 2017-04-25 DIAGNOSIS — R911 Solitary pulmonary nodule: Secondary | ICD-10-CM | POA: Diagnosis not present

## 2017-04-25 DIAGNOSIS — E701 Other hyperphenylalaninemias: Secondary | ICD-10-CM | POA: Diagnosis not present

## 2017-04-25 DIAGNOSIS — C801 Malignant (primary) neoplasm, unspecified: Principal | ICD-10-CM

## 2017-04-25 DIAGNOSIS — C786 Secondary malignant neoplasm of retroperitoneum and peritoneum: Secondary | ICD-10-CM

## 2017-04-25 DIAGNOSIS — E785 Hyperlipidemia, unspecified: Secondary | ICD-10-CM | POA: Diagnosis not present

## 2017-04-25 DIAGNOSIS — Z79899 Other long term (current) drug therapy: Secondary | ICD-10-CM

## 2017-04-25 DIAGNOSIS — Z23 Encounter for immunization: Secondary | ICD-10-CM

## 2017-04-25 DIAGNOSIS — R188 Other ascites: Secondary | ICD-10-CM | POA: Diagnosis not present

## 2017-04-25 DIAGNOSIS — D696 Thrombocytopenia, unspecified: Secondary | ICD-10-CM | POA: Diagnosis not present

## 2017-04-25 DIAGNOSIS — M5136 Other intervertebral disc degeneration, lumbar region: Secondary | ICD-10-CM

## 2017-04-25 DIAGNOSIS — J9811 Atelectasis: Secondary | ICD-10-CM | POA: Diagnosis not present

## 2017-04-25 DIAGNOSIS — T451X5S Adverse effect of antineoplastic and immunosuppressive drugs, sequela: Secondary | ICD-10-CM

## 2017-04-25 DIAGNOSIS — Z87891 Personal history of nicotine dependence: Secondary | ICD-10-CM

## 2017-04-25 DIAGNOSIS — M479 Spondylosis, unspecified: Secondary | ICD-10-CM | POA: Diagnosis not present

## 2017-04-25 DIAGNOSIS — M1712 Unilateral primary osteoarthritis, left knee: Secondary | ICD-10-CM

## 2017-04-25 DIAGNOSIS — K219 Gastro-esophageal reflux disease without esophagitis: Secondary | ICD-10-CM | POA: Diagnosis not present

## 2017-04-25 LAB — CBC WITH DIFFERENTIAL/PLATELET
BASOS ABS: 0.1 10*3/uL (ref 0–0.1)
Basophils Relative: 1 %
Eosinophils Absolute: 0.1 10*3/uL (ref 0–0.7)
Eosinophils Relative: 1 %
HEMATOCRIT: 39.5 % (ref 35.0–47.0)
HEMOGLOBIN: 13.2 g/dL (ref 12.0–16.0)
LYMPHS ABS: 1.8 10*3/uL (ref 1.0–3.6)
LYMPHS PCT: 21 %
MCH: 31.4 pg (ref 26.0–34.0)
MCHC: 33.4 g/dL (ref 32.0–36.0)
MCV: 93.9 fL (ref 80.0–100.0)
Monocytes Absolute: 0.9 10*3/uL (ref 0.2–0.9)
Monocytes Relative: 10 %
NEUTROS ABS: 5.8 10*3/uL (ref 1.4–6.5)
NEUTROS PCT: 67 %
PLATELETS: 125 10*3/uL — AB (ref 150–440)
RBC: 4.2 MIL/uL (ref 3.80–5.20)
RDW: 18.2 % — ABNORMAL HIGH (ref 11.5–14.5)
WBC: 8.6 10*3/uL (ref 3.6–11.0)

## 2017-04-25 LAB — COMPREHENSIVE METABOLIC PANEL
ALT: 29 U/L (ref 14–54)
ANION GAP: 10 (ref 5–15)
AST: 38 U/L (ref 15–41)
Albumin: 4.1 g/dL (ref 3.5–5.0)
Alkaline Phosphatase: 169 U/L — ABNORMAL HIGH (ref 38–126)
BUN: 14 mg/dL (ref 6–20)
CHLORIDE: 103 mmol/L (ref 101–111)
CO2: 22 mmol/L (ref 22–32)
Calcium: 9.1 mg/dL (ref 8.9–10.3)
Creatinine, Ser: 0.65 mg/dL (ref 0.44–1.00)
Glucose, Bld: 105 mg/dL — ABNORMAL HIGH (ref 65–99)
POTASSIUM: 4.2 mmol/L (ref 3.5–5.1)
Sodium: 135 mmol/L (ref 135–145)
Total Bilirubin: 0.6 mg/dL (ref 0.3–1.2)
Total Protein: 7.2 g/dL (ref 6.5–8.1)

## 2017-04-25 MED ORDER — INFLUENZA VAC SPLIT QUAD 0.5 ML IM SUSY: 0.5000 mL | PREFILLED_SYRINGE | Freq: Once | INTRAMUSCULAR | Status: DC

## 2017-04-25 MED ORDER — INFLUENZA VAC SPLIT QUAD 0.5 ML IM SUSY
0.5000 mL | PREFILLED_SYRINGE | Freq: Once | INTRAMUSCULAR | Status: AC
  Administered 2017-04-25: 0.5 mL via INTRAMUSCULAR
  Filled 2017-04-25: qty 0.5

## 2017-04-25 NOTE — Progress Notes (Signed)
Patient is here today for follow up. She would like to skip treatment today due to having things to do the rest of the week. She would also like to get her flu shot

## 2017-04-27 ENCOUNTER — Inpatient Hospital Stay: Payer: BLUE CROSS/BLUE SHIELD

## 2017-05-30 ENCOUNTER — Telehealth: Payer: Self-pay

## 2017-05-30 NOTE — Telephone Encounter (Signed)
  Oncology Nurse Navigator Documentation Received call from Valerie Horton reporting her left knee is "giving out on her" and is weak. Surgery in the 80's for ACL. Denies any injury or pain in her knee. Spoke with Dr. Grayland Ormond. She will follow up with primary care for her knee because she will need a clinical assessment before any imaging could be ordered. She verbalized understanding. Navigator Location: [facility] (05/30/17 1500)   )Navigator Encounter Type: Telephone (05/30/17 1500) Telephone: Outgoing Call;Symptom Mgt (05/30/17 1500)                                                  Time Spent with Patient: 30 (05/30/17 1500)

## 2017-06-06 ENCOUNTER — Ambulatory Visit
Admission: RE | Admit: 2017-06-06 | Discharge: 2017-06-06 | Disposition: A | Discharge status: Home / Self Care | Payer: BLUE CROSS/BLUE SHIELD | Source: Ambulatory Visit | Attending: Oncology | Admitting: Oncology

## 2017-06-06 DIAGNOSIS — C801 Malignant (primary) neoplasm, unspecified: Secondary | ICD-10-CM | POA: Insufficient documentation

## 2017-06-06 DIAGNOSIS — R161 Splenomegaly, not elsewhere classified: Secondary | ICD-10-CM | POA: Diagnosis not present

## 2017-06-06 DIAGNOSIS — R918 Other nonspecific abnormal finding of lung field: Secondary | ICD-10-CM | POA: Insufficient documentation

## 2017-06-06 DIAGNOSIS — N200 Calculus of kidney: Secondary | ICD-10-CM | POA: Diagnosis not present

## 2017-06-06 DIAGNOSIS — C786 Secondary malignant neoplasm of retroperitoneum and peritoneum: Secondary | ICD-10-CM | POA: Diagnosis present

## 2017-06-06 DIAGNOSIS — R188 Other ascites: Secondary | ICD-10-CM | POA: Insufficient documentation

## 2017-06-06 MED ORDER — IOPAMIDOL (ISOVUE-300) INJECTION 61%
100.0000 mL | Freq: Once | INTRAVENOUS | Status: AC | PRN
  Administered 2017-06-06: 100 mL via INTRAVENOUS

## 2017-06-07 ENCOUNTER — Other Ambulatory Visit: Payer: Self-pay

## 2017-06-07 DIAGNOSIS — C801 Malignant (primary) neoplasm, unspecified: Principal | ICD-10-CM

## 2017-06-07 DIAGNOSIS — C786 Secondary malignant neoplasm of retroperitoneum and peritoneum: Secondary | ICD-10-CM

## 2017-06-07 NOTE — Progress Notes (Signed)
[place]  [phone] [phone]) [phone]  ID: NATHANIEL YADEN OB: 01-30-1961  MR#: [id number]  CSN#:[id number]  Patient Care Team: Valerie Roys, DO as PCP - General (Family Medicine) Clent Jacks, RN as Registered Nurse Garnetta Buddy, MD as Consulting Physician (Internal Medicine)  CHIEF COMPLAINT: Peritoneal carcinomatosis, metastatic adenocarcinoma of likely lower GI primary.  INTERVAL HISTORY: Patient returns to clinic today for further evaluation and discussion of her imaging results. She currently feels well and is asymptomatic. She has no neurological complaints. She denies any recent fevers or illnesses.  She has no chest pain or shortness of breath.  She denies and nausea, vomiting, constipation, or diarrhea. She does not complain of bloating or abdominal pain today. She has no melena or hematochezia. She has no urinary complaints.  Patient offers no specific complaints.  REVIEW OF SYSTEMS:   Review of Systems  Constitutional: Negative for fever, malaise/fatigue and weight loss.  Respiratory: Negative.  Negative for cough and shortness of breath.   Cardiovascular: Negative.  Negative for chest pain.  Gastrointestinal: Negative for abdominal pain, blood in stool, constipation, diarrhea, melena and vomiting.  Genitourinary: Negative.   Musculoskeletal: Negative.   Neurological: Negative.  Negative for sensory change and weakness.  Psychiatric/Behavioral: Negative.  The patient is not nervous/anxious.     As per HPI. Otherwise, a complete review of systems is negative.  PAST MEDICAL HISTORY: Past Medical History:  Diagnosis Date  . Acid reflux   . Allergic rhinitis   . Anxiety   . Arthritis    left knee  . Cervical spondylosis   . CTS (carpal tunnel syndrome)    Right  . Diverticulosis   . HSV-2 (herpes simplex virus 2) infection   . Hyperlipidemia   . Ovarian failure   . Peritoneal carcinomatosis ([place]) 01/2016   chemo  tx's.   . Rosacea   . Wears contact lenses     PAST SURGICAL HISTORY: Past Surgical History:  Procedure Laterality Date  . ANTERIOR CRUCIATE LIGAMENT REPAIR Left   . CESAREAN SECTION    . COLONOSCOPY WITH PROPOFOL N/A 01/29/2016   Procedure: COLONOSCOPY WITH PROPOFOL;  Surgeon: Lucilla Lame, MD;  Location: [facility];  Service: Endoscopy;  Laterality: N/A;  . ESOPHAGOGASTRODUODENOSCOPY (EGD) WITH PROPOFOL N/A 01/29/2016   Procedure: ESOPHAGOGASTRODUODENOSCOPY (EGD) WITH PROPOFOL;  Surgeon: Lucilla Lame, MD;  Location: [facility];  Service: Endoscopy;  Laterality: N/A;  . PERIPHERAL VASCULAR CATHETERIZATION N/A 02/10/2016   Procedure: Glori Luis Cath Insertion;  Surgeon: Algernon Huxley, MD;  Location: [facility];  Service: Cardiovascular;  Laterality: N/A;    FAMILY HISTORY: Unknown.  Patient is adopted.     ADVANCED DIRECTIVES:    HEALTH MAINTENANCE: Social History   Tobacco Use  . Smoking status: Former Research scientist (life sciences)  . Smokeless tobacco: Never Used  . Tobacco comment: Quit in her 55's  Substance Use Topics  . Alcohol use: Yes    Alcohol/week: 0.0 oz    Comment: 1 drink/mo  . Drug use: No     Colonoscopy:  PAP:  Bone density:  Lipid panel:  No Known Allergies  Current Outpatient Medications  Medication Sig Dispense Refill  . acyclovir (ZOVIRAX) 800 MG tablet TAKE 1 TABLET BY MOUTH EVERY DAY 90 tablet 1  . Diphenhyd-Hydrocort-Nystatin (FIRST-DUKES MOUTHWASH) SUSP Use as directed 5 mLs in the mouth or throat 4 (four) times daily as needed. 1 Bottle 2  . escitalopram (LEXAPRO) 10 MG tablet TAKE 1 TABLET BY  MOUTH EVERY DAY 90 tablet 1  . gabapentin (NEURONTIN) 300 MG capsule Take 300 mg by mouth at bedtime.    . lidocaine-prilocaine (EMLA) cream Apply 1 application topically as needed. 30 g 3  . ondansetron (ZOFRAN) 8 MG tablet Take by mouth every 8 (eight) hours as needed for nausea or vomiting.    . pantoprazole (PROTONIX) 40 MG tablet Take 1 tablet (40 mg  total) by mouth daily. 30 tablet 2  . prochlorperazine (COMPAZINE) 10 MG tablet Take 10 mg by mouth every 6 (six) hours as needed for nausea or vomiting.     No current facility-administered medications for this visit.    Facility-Administered Medications Ordered in Other Visits  Medication Dose Route Frequency Provider Last Rate Last Dose  . heparin lock flush 100 unit/mL  500 Units Intravenous Once Lloyd Huger, MD      . sodium chloride flush (NS) 0.9 % injection 10 mL  10 mL Intravenous PRN Lloyd Huger, MD   10 mL at 08/16/16 0925  . sodium chloride flush (NS) 0.9 % injection 10 mL  10 mL Intracatheter PRN Lloyd Huger, MD   10 mL at 01/26/17 1343    OBJECTIVE: Vitals:   06/08/17 1036  BP: 108/72  Pulse: 77  Resp: 18  Temp: (!) 97.2 F (36.2 C)     Body mass index is 25.08 kg/m.    ECOG FS:0 - Asymptomatic  General: Well-developed, well-nourished, no acute distress. Eyes: Pink conjunctiva, anicteric sclera. HEENT: clear oropharynx without ulceration or erythema. Lungs: Clear to auscultation bilaterally. Heart: Regular rate and rhythm. No rubs, murmurs, or gallops. Abdomen: Mildly distended, nontender.  Musculoskeletal: No edema, cyanosis, or clubbing. Neuro: Alert, answering all questions appropriately. Cranial nerves grossly intact. Skin: No rashes or petechiae noted. Psych: Normal affect.   LAB RESULTS:  Lab Results  Component Value Date   NA 134 (L) 06/08/2017   K 4.2 06/08/2017   CL 102 06/08/2017   CO2 25 06/08/2017   GLUCOSE 97 06/08/2017   BUN 17 06/08/2017   CREATININE 0.62 06/08/2017   CALCIUM 9.2 06/08/2017   PROT 7.6 06/08/2017   ALBUMIN 4.1 06/08/2017   AST 34 06/08/2017   ALT 28 06/08/2017   ALKPHOS 155 (H) 06/08/2017   BILITOT 0.6 06/08/2017   GFRNONAA >60 06/08/2017   GFRAA >60 06/08/2017    Lab Results  Component Value Date   WBC 5.7 06/08/2017   NEUTROABS 3.3 06/08/2017   HGB 13.5 06/08/2017   HCT 40.4 06/08/2017    MCV 92.4 06/08/2017   PLT 161 06/08/2017     STUDIES: Ct Chest W Contrast  Result Date: 06/06/2017 CLINICAL DATA:  Metastatic adenocarcinoma with peritoneal carcinomatosis restaging/response to therapy, probable lower GI primary. EXAM: CT CHEST, ABDOMEN, AND PELVIS WITH CONTRAST TECHNIQUE: Multidetector CT imaging of the chest, abdomen and pelvis was performed following the standard protocol during bolus administration of intravenous contrast. CONTRAST:  133mL ISOVUE-300 IOPAMIDOL (ISOVUE-300) INJECTION 61% COMPARISON:  04/24/2017 FINDINGS: CT CHEST FINDINGS Cardiovascular: Right IJ Port-A-Cath tip:  SVC. Mediastinum/Nodes: Unremarkable Lungs/Pleura: Stable 2 mm right middle lobe nodule, image 86/4. 3 mm left apical nodule on image 26/4 is not changed from 01/25/2016. No new or enlarging nodules. Musculoskeletal: Thoracic spondylosis. Stable 10 mm lucency in the T12 vertebral body, probably a hemangioma. CT ABDOMEN PELVIS FINDINGS Hepatobiliary: No change in the rim calcified lesion along the dome of the right hepatic lobe, 0.8 cm in diameter on image 40/2. As before, the liver has  a morphology raise the possibility of early cirrhosis. Pancreas: Unremarkable Spleen: Borderline splenomegaly unchanged from prior. Adrenals/Urinary Tract: I suspect a 1 mm calculus in the left kidney lower pole collecting system on image 102/5. Otherwise negative. Adrenal glands normal. Stomach/Bowel: Thickened appearance of the stomach antrum on image 60/2 ; however, this appearance is not all together dissimilar to the appearance on 01/11/2016 and the patient did have an upper endoscopy on 01/29/2016 in which the antrum appeared unremarkable. The appendix is not well seen back given the surrounding fluid and tumor, the possibility of a abnormal appendix is not excluded. Vascular/Lymphatic: Unremarkable Reproductive: Unremarkable aside for the surrounding free pelvic fluid. Other: Widespread peritoneal and omental metastatic  disease with associated ascites. The omental infiltration is slightly improved although still extensive. The meta pelvic ascites is about the same. Nodularity along the paracolic gutters. Musculoskeletal: Degenerative disc disease at L1-2 and L2-3. IMPRESSION: 1. The overall density of the omental infiltrative tumor is subjectively mildly improved, but otherwise similar region of distribution and similar amount of ascites. Extensive peritoneal spread of tumor. The appendix is not well seen and I cannot exclude an appendiceal primary. 2. There is some wall thickening in the stomach antrum. However, an upper endoscopy performed last year was negative in this region, and the appearance may be incidental due to some surrounding ascites. 3. Several tiny pulmonary nodules are stable and quite likely benign. 4. Suspected early cirrhosis.  Borderline splenomegaly. 5. 1 mm nonobstructive calculus in the left kidney lower pole. Electronically Signed   By: Van Clines M.D.   On: 06/06/2017 16:06   Ct Abdomen Pelvis W Contrast  Result Date: 06/06/2017 CLINICAL DATA:  Metastatic adenocarcinoma with peritoneal carcinomatosis restaging/response to therapy, probable lower GI primary. EXAM: CT CHEST, ABDOMEN, AND PELVIS WITH CONTRAST TECHNIQUE: Multidetector CT imaging of the chest, abdomen and pelvis was performed following the standard protocol during bolus administration of intravenous contrast. CONTRAST:  174mL ISOVUE-300 IOPAMIDOL (ISOVUE-300) INJECTION 61% COMPARISON:  04/24/2017 FINDINGS: CT CHEST FINDINGS Cardiovascular: Right IJ Port-A-Cath tip:  SVC. Mediastinum/Nodes: Unremarkable Lungs/Pleura: Stable 2 mm right middle lobe nodule, image 86/4. 3 mm left apical nodule on image 26/4 is not changed from 01/25/2016. No new or enlarging nodules. Musculoskeletal: Thoracic spondylosis. Stable 10 mm lucency in the T12 vertebral body, probably a hemangioma. CT ABDOMEN PELVIS FINDINGS Hepatobiliary: No change in the rim  calcified lesion along the dome of the right hepatic lobe, 0.8 cm in diameter on image 40/2. As before, the liver has a morphology raise the possibility of early cirrhosis. Pancreas: Unremarkable Spleen: Borderline splenomegaly unchanged from prior. Adrenals/Urinary Tract: I suspect a 1 mm calculus in the left kidney lower pole collecting system on image 102/5. Otherwise negative. Adrenal glands normal. Stomach/Bowel: Thickened appearance of the stomach antrum on image 60/2 ; however, this appearance is not all together dissimilar to the appearance on 01/11/2016 and the patient did have an upper endoscopy on 01/29/2016 in which the antrum appeared unremarkable. The appendix is not well seen back given the surrounding fluid and tumor, the possibility of a abnormal appendix is not excluded. Vascular/Lymphatic: Unremarkable Reproductive: Unremarkable aside for the surrounding free pelvic fluid. Other: Widespread peritoneal and omental metastatic disease with associated ascites. The omental infiltration is slightly improved although still extensive. The meta pelvic ascites is about the same. Nodularity along the paracolic gutters. Musculoskeletal: Degenerative disc disease at L1-2 and L2-3. IMPRESSION: 1. The overall density of the omental infiltrative tumor is subjectively mildly improved, but otherwise similar region  of distribution and similar amount of ascites. Extensive peritoneal spread of tumor. The appendix is not well seen and I cannot exclude an appendiceal primary. 2. There is some wall thickening in the stomach antrum. However, an upper endoscopy performed last year was negative in this region, and the appearance may be incidental due to some surrounding ascites. 3. Several tiny pulmonary nodules are stable and quite likely benign. 4. Suspected early cirrhosis.  Borderline splenomegaly. 5. 1 mm nonobstructive calculus in the left kidney lower pole. Electronically Signed   By: Van Clines M.D.   On:  06/06/2017 16:06    ASSESSMENT: Peritoneal carcinomatosis, metastatic adenocarcinoma of likely lower GI primary.  PLAN:    1. Peritoneal carcinomatosis: Pathology results reviewed, immunohistochemistry suggested lower GI primary. CT scan results from [facility] on January 11, 2017 essentially revealed stable disease. CT scan results from Decemeber 4, 2018 reviewed independently and reported as above with improved disease burden. Her last paracentesis was on December 23, 2016. Of note, patient had a normal colonoscopy on October 16, 2014 in [place], [place]. Because of suspected progression of disease, patient's treatment was switched to FOLFIRI. Patient also had consultation at [facility] who determined that no surgical or intraperitoneal chemotherapy would be beneficial. Foundation 1 testing did not reveal any actionable mutations. She does not require treatment at this time.  Her last treatment occurred on October 2-4, 2018. Return to clinic in 3 months with repeat imaging and further evaluation.  Patient expressed understanding that she likely will require to reinitiate treatment at some point in future which will be considered cycle 19 of FOLFIRI plus Avastin. 2: Genetic testing: Negative. 3. History of neutropenia: Neulasta with treatments. 4. Peripheral neuropathy: Resolved. 5. Ascites: Patient had a paracentesis on December 23, 2016 which removed 700 mL of fluid. Her last paracentesis prior to this was on September 16, 2016 when 3.5 L of fluid was removed. 6. Tongue lesion: Resolved.  Patient expressed understanding and was in agreement with this plan. She also understands that She can call clinic at any time with any questions, concerns, or complaints.     Lloyd Huger, MD 06/10/17 3:58 PM

## 2017-06-08 ENCOUNTER — Inpatient Hospital Stay: Payer: BLUE CROSS/BLUE SHIELD | Attending: Oncology

## 2017-06-08 ENCOUNTER — Inpatient Hospital Stay (HOSPITAL_BASED_OUTPATIENT_CLINIC_OR_DEPARTMENT_OTHER): Payer: BLUE CROSS/BLUE SHIELD | Admitting: Oncology

## 2017-06-08 VITALS — BP 108/72 | HR 77 | Temp 97.2°F | Resp 18 | Wt 143.4 lb

## 2017-06-08 DIAGNOSIS — C801 Malignant (primary) neoplasm, unspecified: Secondary | ICD-10-CM

## 2017-06-08 DIAGNOSIS — M5136 Other intervertebral disc degeneration, lumbar region: Secondary | ICD-10-CM | POA: Insufficient documentation

## 2017-06-08 DIAGNOSIS — Z79899 Other long term (current) drug therapy: Secondary | ICD-10-CM | POA: Diagnosis not present

## 2017-06-08 DIAGNOSIS — D696 Thrombocytopenia, unspecified: Secondary | ICD-10-CM | POA: Diagnosis not present

## 2017-06-08 DIAGNOSIS — R918 Other nonspecific abnormal finding of lung field: Secondary | ICD-10-CM

## 2017-06-08 DIAGNOSIS — C786 Secondary malignant neoplasm of retroperitoneum and peritoneum: Secondary | ICD-10-CM | POA: Insufficient documentation

## 2017-06-08 DIAGNOSIS — Z87891 Personal history of nicotine dependence: Secondary | ICD-10-CM | POA: Insufficient documentation

## 2017-06-08 DIAGNOSIS — E785 Hyperlipidemia, unspecified: Secondary | ICD-10-CM

## 2017-06-08 DIAGNOSIS — K219 Gastro-esophageal reflux disease without esophagitis: Secondary | ICD-10-CM

## 2017-06-08 DIAGNOSIS — R188 Other ascites: Secondary | ICD-10-CM

## 2017-06-08 DIAGNOSIS — Z95828 Presence of other vascular implants and grafts: Secondary | ICD-10-CM

## 2017-06-08 LAB — CBC WITH DIFFERENTIAL/PLATELET
Basophils Absolute: 0 10*3/uL (ref 0–0.1)
Basophils Relative: 1 %
EOS ABS: 0.1 10*3/uL (ref 0–0.7)
EOS PCT: 1 %
HCT: 40.4 % (ref 35.0–47.0)
Hemoglobin: 13.5 g/dL (ref 12.0–16.0)
LYMPHS ABS: 1.8 10*3/uL (ref 1.0–3.6)
Lymphocytes Relative: 32 %
MCH: 30.8 pg (ref 26.0–34.0)
MCHC: 33.3 g/dL (ref 32.0–36.0)
MCV: 92.4 fL (ref 80.0–100.0)
MONO ABS: 0.5 10*3/uL (ref 0.2–0.9)
Monocytes Relative: 8 %
Neutro Abs: 3.3 10*3/uL (ref 1.4–6.5)
Neutrophils Relative %: 58 %
PLATELETS: 161 10*3/uL (ref 150–440)
RBC: 4.37 MIL/uL (ref 3.80–5.20)
RDW: 14.5 % (ref 11.5–14.5)
WBC: 5.7 10*3/uL (ref 3.6–11.0)

## 2017-06-08 LAB — COMPREHENSIVE METABOLIC PANEL
ALBUMIN: 4.1 g/dL (ref 3.5–5.0)
ALT: 28 U/L (ref 14–54)
AST: 34 U/L (ref 15–41)
Alkaline Phosphatase: 155 U/L — ABNORMAL HIGH (ref 38–126)
Anion gap: 7 (ref 5–15)
BILIRUBIN TOTAL: 0.6 mg/dL (ref 0.3–1.2)
BUN: 17 mg/dL (ref 6–20)
CHLORIDE: 102 mmol/L (ref 101–111)
CO2: 25 mmol/L (ref 22–32)
CREATININE: 0.62 mg/dL (ref 0.44–1.00)
Calcium: 9.2 mg/dL (ref 8.9–10.3)
GFR calc Af Amer: >60 mL/min (ref >60)
GLUCOSE: 97 mg/dL (ref 65–99)
Potassium: 4.2 mmol/L (ref 3.5–5.1)
Sodium: 134 mmol/L — ABNORMAL LOW (ref 135–145)
Total Protein: 7.6 g/dL (ref 6.5–8.1)

## 2017-06-08 MED ORDER — HEPARIN SOD (PORK) LOCK FLUSH 100 UNIT/ML IV SOLN
500.0000 [IU] | INTRAVENOUS | Status: AC | PRN
  Administered 2017-06-08: 500 [IU]

## 2017-06-08 MED ORDER — SODIUM CHLORIDE 0.9% FLUSH
10.0000 mL | INTRAVENOUS | Status: AC | PRN
  Administered 2017-06-08: 10 mL
  Filled 2017-06-08: qty 10

## 2017-06-13 ENCOUNTER — Telehealth: Payer: Self-pay | Admitting: Family Medicine

## 2017-06-13 ENCOUNTER — Encounter: Payer: Self-pay | Admitting: Family Medicine

## 2017-06-13 ENCOUNTER — Ambulatory Visit
Admission: RE | Admit: 2017-06-13 | Discharge: 2017-06-13 | Disposition: A | Discharge status: Home / Self Care | Payer: BLUE CROSS/BLUE SHIELD | Source: Ambulatory Visit | Attending: Family Medicine | Admitting: Family Medicine

## 2017-06-13 ENCOUNTER — Ambulatory Visit: Payer: BLUE CROSS/BLUE SHIELD | Admitting: Family Medicine

## 2017-06-13 VITALS — BP 119/61 | HR 85 | Temp 97.9°F | Wt 144.6 lb

## 2017-06-13 DIAGNOSIS — M2578 Osteophyte, vertebrae: Secondary | ICD-10-CM | POA: Insufficient documentation

## 2017-06-13 DIAGNOSIS — M5030 Other cervical disc degeneration, unspecified cervical region: Secondary | ICD-10-CM | POA: Insufficient documentation

## 2017-06-13 DIAGNOSIS — R202 Paresthesia of skin: Secondary | ICD-10-CM | POA: Diagnosis present

## 2017-06-13 DIAGNOSIS — M542 Cervicalgia: Secondary | ICD-10-CM

## 2017-06-13 DIAGNOSIS — M25562 Pain in left knee: Secondary | ICD-10-CM

## 2017-06-13 DIAGNOSIS — M1990 Unspecified osteoarthritis, unspecified site: Secondary | ICD-10-CM | POA: Diagnosis not present

## 2017-06-13 LAB — BAYER DCA HB A1C WAIVED: HB A1C: 5.1 % (ref <7.0)

## 2017-06-13 MED ORDER — NAPROXEN 500 MG PO TABS: 500.0000 mg | ORAL_TABLET | Freq: Two times a day (BID) | ORAL | 0 refills | Status: DC

## 2017-06-13 NOTE — Patient Instructions (Addendum)
Cervical Strain and Sprain Rehab Ask your health care provider which exercises are safe for you. Do exercises exactly as told by your health care provider and adjust them as directed. It is normal to feel mild stretching, pulling, tightness, or discomfort as you do these exercises, but you should stop right away if you feel sudden pain or your pain gets worse.Do not begin these exercises until told by your health care provider. Stretching and range of motion exercises These exercises warm up your muscles and joints and improve the movement and flexibility of your neck. These exercises also help to relieve pain, numbness, and tingling. Exercise A: Cervical side bend  1. Using good posture, sit on a stable chair or stand up. 2. Without moving your shoulders, slowly tilt your left / right ear to your shoulder until you feel a stretch in your neck muscles. You should be looking straight ahead. 3. Hold for __________ seconds. 4. Repeat with the other side of your neck. Repeat __________ times. Complete this exercise __________ times a day. Exercise B: Cervical rotation  1. Using good posture, sit on a stable chair or stand up. 2. Slowly turn your head to the side as if you are looking over your left / right shoulder. ? Keep your eyes level with the ground. ? Stop when you feel a stretch along the side and the back of your neck. 3. Hold for __________ seconds. 4. Repeat this by turning to your other side. Repeat __________ times. Complete this exercise __________ times a day. Exercise C: Thoracic extension and pectoral stretch 1. Roll a towel or a small blanket so it is about 4 inches (10 cm) in diameter. 2. Lie down on your back on a firm surface. 3. Put the towel lengthwise, under your spine in the middle of your back. It should not be not under your shoulder blades. The towel should line up with your spine from your middle back to your lower back. 4. Put your hands behind your head and let your  elbows fall out to your sides. 5. Hold for __________ seconds. Repeat __________ times. Complete this exercise __________ times a day. Strengthening exercises These exercises build strength and endurance in your neck. Endurance is the ability to use your muscles for a long time, even after your muscles get tired. Exercise D: Upper cervical flexion, isometric 1. Lie on your back with a thin pillow behind your head and a small rolled-up towel under your neck. 2. Gently tuck your chin toward your chest and nod your head down to look toward your feet. Do not lift your head off the pillow. 3. Hold for __________ seconds. 4. Release the tension slowly. Relax your neck muscles completely before you repeat this exercise. Repeat __________ times. Complete this exercise __________ times a day. Exercise E: Cervical extension, isometric  1. Stand about 6 inches (15 cm) away from a wall, with your back facing the wall. 2. Place a soft object, about 6-8 inches (15-20 cm) in diameter, between the back of your head and the wall. A soft object could be a small pillow, a ball, or a folded towel. 3. Gently tilt your head back and press into the soft object. Keep your jaw and forehead relaxed. 4. Hold for __________ seconds. 5. Release the tension slowly. Relax your neck muscles completely before you repeat this exercise. Repeat __________ times. Complete this exercise __________ times a day. Posture and body mechanics  Body mechanics refers to the movements and positions of   your body while you do your daily activities. Posture is part of body mechanics. Good posture and healthy body mechanics can help to relieve stress in your body's tissues and joints. Good posture means that your spine is in its natural S-curve position (your spine is neutral), your shoulders are pulled back slightly, and your head is not tipped forward. The following are general guidelines for applying improved posture and body mechanics to  your everyday activities. Standing  When standing, keep your spine neutral and keep your feet about hip-width apart. Keep a slight bend in your knees. Your ears, shoulders, and hips should line up.  When you do a task in which you stand in one place for a long time, place one foot up on a stable object that is 2-4 inches (5-10 cm) high, such as a footstool. This helps keep your spine neutral. Sitting   When sitting, keep your spine neutral and your keep feet flat on the floor. Use a footrest, if necessary, and keep your thighs parallel to the floor. Avoid rounding your shoulders, and avoid tilting your head forward.  When working at a desk or a computer, keep your desk at a height where your hands are slightly lower than your elbows. Slide your chair under your desk so you are close enough to maintain good posture.  When working at a computer, place your monitor at a height where you are looking straight ahead and you do not have to tilt your head forward or downward to look at the screen. Resting When lying down and resting, avoid positions that are most painful for you. Try to support your neck in a neutral position. You can use a contour pillow or a small rolled-up towel. Your pillow should support your neck but not push on it. This information is not intended to replace advice given to you by your health care provider. Make sure you discuss any questions you have with your health care provider. Document Released: 06/20/2005 Document Revised: 02/25/2016 Document Reviewed: 05/27/2015 Elsevier Interactive Patient Education  2018 [employer].  Knee Exercises Ask your health care provider which exercises are safe for you. Do exercises exactly as told by your health care provider and adjust them as directed. It is normal to feel mild stretching, pulling, tightness, or discomfort as you do these exercises, but you should stop right away if you feel sudden pain or your pain gets worse.Do not  begin these exercises until told by your health care provider. STRETCHING AND RANGE OF MOTION EXERCISES These exercises warm up your muscles and joints and improve the movement and flexibility of your knee. These exercises also help to relieve pain, numbness, and tingling. Exercise A: Knee Extension, Prone 5. Lie on your abdomen on a bed. 6. Place your left / right knee just beyond the edge of the surface so your knee is not on the bed. You can put a towel under your left / right thigh just above your knee for comfort. 7. Relax your leg muscles and allow gravity to straighten your knee. You should feel a stretch behind your left / right knee. 8. Hold this position for __________ seconds. 9. Scoot up so your knee is supported between repetitions. Repeat __________ times. Complete this stretch __________ times a day. Exercise B: Knee Flexion, Active  1. Lie on your back with both knees straight. If this causes back discomfort, bend your left / right knee so your foot is flat on the floor. 2. Slowly slide your  left / right heel back toward your buttocks until you feel a gentle stretch in the front of your knee or thigh. 3. Hold this position for __________ seconds. 4. Slowly slide your left / right heel back to the starting position. Repeat __________ times. Complete this exercise __________ times a day. Exercise C: Quadriceps, Prone  6. Lie on your abdomen on a firm surface, such as a bed or padded floor. 7. Bend your left / right knee and hold your ankle. If you cannot reach your ankle or pant leg, loop a belt around your foot and grab the belt instead. 8. Gently pull your heel toward your buttocks. Your knee should not slide out to the side. You should feel a stretch in the front of your thigh and knee. 9. Hold this position for __________ seconds. Repeat __________ times. Complete this stretch __________ times a day. Exercise D: Hamstring, Supine 1. Lie on your back. 2. Loop a belt or  towel over the ball of your left / right foot. The ball of your foot is on the walking surface, right under your toes. 3. Straighten your left / right knee and slowly pull on the belt to raise your leg until you feel a gentle stretch behind your knee. ? Do not let your left / right knee bend while you do this. ? Keep your other leg flat on the floor. 4. Hold this position for __________ seconds. Repeat __________ times. Complete this stretch __________ times a day. STRENGTHENING EXERCISES These exercises build strength and endurance in your knee. Endurance is the ability to use your muscles for a long time, even after they get tired. Exercise E: Quadriceps, Isometric  6. Lie on your back with your left / right leg extended and your other knee bent. Put a rolled towel or small pillow under your knee if told by your health care provider. 7. Slowly tense the muscles in the front of your left / right thigh. You should see your kneecap slide up toward your hip or see increased dimpling just above the knee. This motion will push the back of the knee toward the floor. 8. For __________ seconds, keep the muscle as tight as you can without increasing your pain. 9. Relax the muscles slowly and completely. Repeat __________ times. Complete this exercise __________ times a day. Exercise F: Straight Leg Raises - Quadriceps 1. Lie on your back with your left / right leg extended and your other knee bent. 2. Tense the muscles in the front of your left / right thigh. You should see your kneecap slide up or see increased dimpling just above the knee. Your thigh may even shake a bit. 3. Keep these muscles tight as you raise your leg 4-6 inches (10-15 cm) off the floor. Do not let your knee bend. 4. Hold this position for __________ seconds. 5. Keep these muscles tense as you lower your leg. 6. Relax your muscles slowly and completely after each repetition. Repeat __________ times. Complete this exercise  __________ times a day. Exercise G: Hamstring, Isometric 1. Lie on your back on a firm surface. 2. Bend your left / right knee approximately __________ degrees. 3. Dig your left / right heel into the surface as if you are trying to pull it toward your buttocks. Tighten the muscles in the back of your thighs to dig as hard as you can without increasing any pain. 4. Hold this position for __________ seconds. 5. Release the tension gradually and allow your muscles  to relax completely for __________ seconds after each repetition. Repeat __________ times. Complete this exercise __________ times a day. Exercise H: Hamstring Curls  If told by your health care provider, do this exercise while wearing ankle weights. Begin with __________ weights. Then increase the weight by 1 lb (0.5 kg) increments. Do not wear ankle weights that are more than __________. 1. Lie on your abdomen with your legs straight. 2. Bend your left / right knee as far as you can without feeling pain. Keep your hips flat against the floor. 3. Hold this position for __________ seconds. 4. Slowly lower your leg to the starting position.  Repeat __________ times. Complete this exercise __________ times a day. Exercise I: Squats (Quadriceps) 1. Stand in front of a table, with your feet and knees pointing straight ahead. You may rest your hands on the table for balance but not for support. 2. Slowly bend your knees and lower your hips like you are going to sit in a chair. ? Keep your weight over your heels, not over your toes. ? Keep your lower legs upright so they are parallel with the table legs. ? Do not let your hips go lower than your knees. ? Do not bend lower than told by your health care provider. ? If your knee pain increases, do not bend as low. 3. Hold the squat position for __________ seconds. 4. Slowly push with your legs to return to standing. Do not use your hands to pull yourself to standing. Repeat __________ times.  Complete this exercise __________ times a day. Exercise J: Wall Slides (Quadriceps)  1. Lean your back against a smooth wall or door while you walk your feet out 18-24 inches (46-61 cm) from it. 2. Place your feet hip-width apart. 3. Slowly slide down the wall or door until your knees bend __________ degrees. Keep your knees over your heels, not over your toes. Keep your knees in line with your hips. 4. Hold for __________ seconds. Repeat __________ times. Complete this exercise __________ times a day. Exercise K: Straight Leg Raises - Hip Abductors 1. Lie on your side with your left / right leg in the top position. Lie so your head, shoulder, knee, and hip line up. You may bend your bottom knee to help you keep your balance. 2. Roll your hips slightly forward so your hips are stacked directly over each other and your left / right knee is facing forward. 3. Leading with your heel, lift your top leg 4-6 inches (10-15 cm). You should feel the muscles in your outer hip lifting. ? Do not let your foot drift forward. ? Do not let your knee roll toward the ceiling. 4. Hold this position for __________ seconds. 5. Slowly return your leg to the starting position. 6. Let your muscles relax completely after each repetition. Repeat __________ times. Complete this exercise __________ times a day. Exercise L: Straight Leg Raises - Hip Extensors 1. Lie on your abdomen on a firm surface. You can put a pillow under your hips if that is more comfortable. 2. Tense the muscles in your buttocks and lift your left / right leg about 4-6 inches (10-15 cm). Keep your knee straight as you lift your leg. 3. Hold this position for __________ seconds. 4. Slowly lower your leg to the starting position. 5. Let your leg relax completely after each repetition. Repeat __________ times. Complete this exercise __________ times a day. This information is not intended to replace advice given to you by  your health care provider.  Make sure you discuss any questions you have with your health care provider. Document Released: 05/04/2005 Document Revised: 03/14/2016 Document Reviewed: 04/26/2015 Elsevier Interactive Patient Education  2018 [employer].

## 2017-06-13 NOTE — Telephone Encounter (Signed)
Called to let patient know that x-rays show some arthritis in both her knee and her neck. Will try naproxen to see how she does. If not improving, consider further imaging vs PT vs referral to ortho.

## 2017-06-13 NOTE — Progress Notes (Signed)
BP 119/61 (BP Location: Left Arm, Patient Position: Sitting, Cuff Size: Normal)   Pulse 85   Temp 97.9 F (36.6 C)   Wt 144 lb 9 oz (65.6 kg)   SpO2 99%   BMI 25.29 kg/m    Subjective:    Patient ID: Valerie Horton, female    DOB: 04/12/1961, 56 y.o.   MRN: [id number]  HPI: Valerie Horton is a 56 y.o. female  Chief Complaint  Patient presents with  . Knee Pain    left, patient states that it goes out on her at times.   . Numbness    hands and feet, stiffness and tightness in left arm    KNEE PAIN Duration: 4 weeks Involved knee: left Mechanism of injury: unknown Location:anterior Onset: sudden Severity: severe  Quality:  dull Frequency: intermittent Radiation: no Aggravating factors: weight bearing and walking  Alleviating factors: ice and rest  Status: worse Treatments attempted: rest, ice and APAP  Relief with NSAIDs?:  No NSAIDs Taken Weakness with weight bearing or walking: yes Sensation of giving way: yes Locking: yes Popping: no Bruising: no Swelling: no Redness: no Paresthesias/decreased sensation: no Fevers: no  NUMBNESS- not sure if it's just from her chemo or not Duration: about a month Onset: gradual Location: L arm and hands from her neck- feels really tight  Bilateral: yes Symmetric: no Decreased sensation: no  Weakness: no Pain: no Quality:  Pins and needles Severity: 6/10  Frequency: constant, but worse with sitting  Trauma: no Recent illness: no Diabetes: no Thyroid disease: no  HIV: no  Alcoholism: no  Spinal cord injury: no Alleviating factors: nothing Aggravating factors: sitting Status: stable Treatments attempted:   Relevant past medical, surgical, family and social history reviewed and updated as indicated. Interim medical history since our last visit reviewed. Allergies and medications reviewed and updated.  Review of Systems  Constitutional: Negative.   Respiratory: Negative.   Cardiovascular: Negative.     Musculoskeletal: Positive for arthralgias, myalgias, neck pain and neck stiffness. Negative for back pain, gait problem and joint swelling.  Skin: Negative.  Negative for pallor.  Neurological: Positive for numbness. Negative for dizziness, tremors, seizures, syncope, facial asymmetry, speech difficulty, weakness, light-headedness and headaches.  Hematological: Negative.   Psychiatric/Behavioral: Negative.     Per HPI unless specifically indicated above     Objective:    BP 119/61 (BP Location: Left Arm, Patient Position: Sitting, Cuff Size: Normal)   Pulse 85   Temp 97.9 F (36.6 C)   Wt 144 lb 9 oz (65.6 kg)   SpO2 99%   BMI 25.29 kg/m   Wt Readings from Last 3 Encounters:  06/13/17 144 lb 9 oz (65.6 kg)  06/08/17 143 lb 6.4 oz (65 kg)  04/25/17 137 lb 12.8 oz (62.5 kg)    Physical Exam  Constitutional: She is oriented to person, place, and time. She appears well-developed and well-nourished. No distress.  HENT:  Head: Normocephalic and atraumatic.  Right Ear: Hearing normal.  Left Ear: Hearing normal.  Nose: Nose normal.  Eyes: Conjunctivae and lids are normal. Right eye exhibits no discharge. Left eye exhibits no discharge. No scleral icterus.  Cardiovascular: Normal rate, regular rhythm, normal heart sounds and intact distal pulses. Exam reveals no gallop and no friction rub.  No murmur heard. Pulmonary/Chest: Effort normal and breath sounds normal. No respiratory distress. She has no wheezes. She has no rales. She exhibits no tenderness.  Musculoskeletal:  Tenderness along joint line of L knee,  negative anterior and posterior drawer, negative varus and valgus, negative appley's compression and distraction  Neurological: She is alert and oriented to person, place, and time.  Skin: Skin is warm, dry and intact. No rash noted. She is not diaphoretic. No erythema. No pallor.  Psychiatric: She has a normal mood and affect. Her speech is normal and behavior is normal.  Judgment and thought content normal. Cognition and memory are normal.  Nursing note and vitals reviewed. Neck Exam:    Tenderness to Palpation: no    Midline cervical spine: no    Paraspinal neck musculature: yes    Trapezius: yes    Sternocleidomastoid: no     Range of Motion:     Flexion: Decreased    Extension: Decreased    Lateral rotation: Decreased    Lateral bending: Decreased     Neuro Examination: Upper extremity DTRs normal & symmetric.  Strength and sensation intact.    Special Tests:     Spurling test: negative   Results for orders placed or performed in visit on 06/08/17  Comprehensive metabolic panel  Result Value Ref Range   Sodium 134 (L) 135 - 145 mmol/L   Potassium 4.2 3.5 - 5.1 mmol/L   Chloride 102 101 - 111 mmol/L   CO2 25 22 - 32 mmol/L   Glucose, Bld 97 65 - 99 mg/dL   BUN 17 6 - 20 mg/dL   Creatinine, Ser 0.62 0.44 - 1.00 mg/dL   Calcium 9.2 8.9 - 10.3 mg/dL   Total Protein 7.6 6.5 - 8.1 g/dL   Albumin 4.1 3.5 - 5.0 g/dL   AST 34 15 - 41 U/L   ALT 28 14 - 54 U/L   Alkaline Phosphatase 155 (H) 38 - 126 U/L   Total Bilirubin 0.6 0.3 - 1.2 mg/dL   GFR calc non Af Amer >60 >60 mL/min   GFR calc Af Amer >60 >60 mL/min   Anion gap 7 5 - 15  CBC with Differential  Result Value Ref Range   WBC 5.7 3.6 - 11.0 K/uL   RBC 4.37 3.80 - 5.20 MIL/uL   Hemoglobin 13.5 12.0 - 16.0 g/dL   HCT 40.4 35.0 - 47.0 %   MCV 92.4 80.0 - 100.0 fL   MCH 30.8 26.0 - 34.0 pg   MCHC 33.3 32.0 - 36.0 g/dL   RDW 14.5 11.5 - 14.5 %   Platelets 161 150 - 440 K/uL   Neutrophils Relative % 58 %   Neutro Abs 3.3 1.4 - 6.5 K/uL   Lymphocytes Relative 32 %   Lymphs Abs 1.8 1.0 - 3.6 K/uL   Monocytes Relative 8 %   Monocytes Absolute 0.5 0.2 - 0.9 K/uL   Eosinophils Relative 1 %   Eosinophils Absolute 0.1 0 - 0.7 K/uL   Basophils Relative 1 %   Basophils Absolute 0.0 0 - 0.1 K/uL      Assessment & Plan:   Problem List Items Addressed This Visit      Musculoskeletal  and Integument   Arthritis   Relevant Medications   naproxen (NAPROSYN) 500 MG tablet    Other Visit Diagnoses    Paresthesias    -  Primary   ? combination of s/e from chemo and some neck arthritis- will start naproxen as she's off chemo and check C-spine x-ray. Await results.    Relevant Orders   Bayer DCA Hb A1c Waived   Thyroid Panel With TSH   VITAMIN D 25 Hydroxy (Vit-D  Deficiency, Fractures)   DG Cervical Spine Complete   Acute pain of left knee       Likely arthritis. Hasn't had x-ray in several years, will obtain new one. Await results. May benefit from steroid shot.   Relevant Orders   DG Knee Complete 4 Views Left   Neck pain       ? arthritis. Will start naproxen as off chemo currently. Obtain x-ray. Await results. Call if not improving.    Relevant Orders   DG Cervical Spine Complete       Follow up plan: Return Pending results.

## 2017-06-14 LAB — THYROID PANEL WITH TSH
Free Thyroxine Index: 1.5 (ref 1.2–4.9)
T3 Uptake Ratio: 21 % — ABNORMAL LOW (ref 24–39)
T4, Total: 7 ug/dL (ref 4.5–12.0)
TSH: 3.14 u[IU]/mL (ref 0.450–4.500)

## 2017-06-14 LAB — VITAMIN D 25 HYDROXY (VIT D DEFICIENCY, FRACTURES): Vit D, 25-Hydroxy: 29.5 ng/mL — ABNORMAL LOW (ref 30.0–100.0)

## 2017-06-22 ENCOUNTER — Telehealth: Payer: Self-pay

## 2017-06-22 NOTE — Telephone Encounter (Signed)
Spoke with Dr. Grayland Ormond regarding Ms. Gauna request to pursue cosmetic surgery. He has given clearance for this. Ms. Gunnells notified that she may pursue cosmetic surgery at this time. Oncology Nurse Navigator Documentation  Navigator Location: [facility] (06/22/17 1300)   )Navigator Encounter Type: Telephone (06/22/17 1300) Telephone: [place] Call (06/22/17 1300)                                                  Time Spent with Patient: 15 (06/22/17 1300)

## 2017-06-29 ENCOUNTER — Ambulatory Visit: Payer: Self-pay

## 2017-06-29 ENCOUNTER — Other Ambulatory Visit: Payer: Self-pay | Admitting: Oncology

## 2017-06-29 DIAGNOSIS — C786 Secondary malignant neoplasm of retroperitoneum and peritoneum: Secondary | ICD-10-CM

## 2017-06-29 DIAGNOSIS — C801 Malignant (primary) neoplasm, unspecified: Secondary | ICD-10-CM

## 2017-06-29 DIAGNOSIS — Z7189 Other specified counseling: Secondary | ICD-10-CM

## 2017-06-29 DIAGNOSIS — M1990 Unspecified osteoarthritis, unspecified site: Secondary | ICD-10-CM

## 2017-06-29 MED ORDER — NAPROXEN 500 MG PO TABS: 500.0000 mg | ORAL_TABLET | Freq: Two times a day (BID) | ORAL | 1 refills | Status: DC

## 2017-06-29 NOTE — Telephone Encounter (Signed)
Called and let patient know everything Dr. Wynetta Emery said. Patient is going to try the gabapentin first.

## 2017-06-29 NOTE — Telephone Encounter (Signed)
She can increase her gabapentin to 300 3x a day if she can tolerate it. If she can't tolerate that, we can send her to PT for her neck or we can send her to neurology to find out what the cause of her numbness is- up to her. I've sent the naproxen in to her pharmacy.

## 2017-06-29 NOTE — Telephone Encounter (Signed)
Routing to provider  

## 2017-06-29 NOTE — Telephone Encounter (Signed)
Pt. called to make Dr. Wynetta Emery aware that her numbness in her hands has increased; reported she saw her a couple weeks ago, and it was discussed.  Stated the numbness has been occurring in the balls of her feet, and the tips of her fingers.  Reported the foot numbness is constant.  Now, reported the numbness in hands has changed to involve the entire hand, bilaterally, and it occurs with laying or sitting.  Stated numbness of hands is waking her up at night.    Also, stated that she is out of her Naproxen that was ordered for the arthritis of neck and knees.  Requesting refill on Naproxen.  Also, questioned if Dr. Wynetta Emery feels she needs to go to Physical Therapy.  Advised pt. Will send this note to Dr. Wynetta Emery for further recommendation.               Reason for Disposition . [1] Numbness or tingling in one or both hands AND [2] is a chronic symptom (recurrent or ongoing AND present > 4 weeks)  Answer Assessment - Initial Assessment Questions 1. SYMPTOM: "What is the main symptom you are concerned about?" (e.g., weakness, numbness)     Numbness in feet and hands 2. ONSET: "When did this start?" (minutes, hours, days; while sleeping)     About one year 3. LAST NORMAL: "When was the last time you were normal (no symptoms)?"     Over one year ago 4. PATTERN "Does this come and go, or has it been constant since it started?"  "Is it present now?"     The hands go numb with laying or sitting; constant numbness in ball of feet 5. CARDIAC SYMPTOMS: "Have you had any of the following symptoms: chest pain, difficulty breathing, palpitations?"     None  6. NEUROLOGIC SYMPTOMS: "Have you had any of the following symptoms: headache, dizziness, vision loss, double vision, changes in speech, unsteady on your feet?"    Denies any H/A, dizziness, visual disturbance, or speech difficulty; occas. Balance problems with numbness in balls of feet.   7. OTHER SYMPTOMS: "Do you have any other symptoms?"    No other  symptoms 8. PREGNANCY: "Is there any chance you are pregnant?" "When was your last menstrual period?"     No;  Protocols used: NEUROLOGIC DEFICIT-A-AH

## 2017-07-20 ENCOUNTER — Inpatient Hospital Stay: Payer: BLUE CROSS/BLUE SHIELD | Attending: Oncology

## 2017-07-20 DIAGNOSIS — C801 Malignant (primary) neoplasm, unspecified: Secondary | ICD-10-CM | POA: Insufficient documentation

## 2017-07-20 DIAGNOSIS — C786 Secondary malignant neoplasm of retroperitoneum and peritoneum: Secondary | ICD-10-CM | POA: Insufficient documentation

## 2017-07-20 DIAGNOSIS — Z452 Encounter for adjustment and management of vascular access device: Secondary | ICD-10-CM | POA: Diagnosis not present

## 2017-07-20 DIAGNOSIS — Z95828 Presence of other vascular implants and grafts: Secondary | ICD-10-CM

## 2017-07-20 MED ORDER — SODIUM CHLORIDE 0.9% FLUSH
10.0000 mL | INTRAVENOUS | Status: AC | PRN
  Administered 2017-07-20: 10 mL
  Filled 2017-07-20: qty 10

## 2017-07-20 MED ORDER — HEPARIN SOD (PORK) LOCK FLUSH 100 UNIT/ML IV SOLN
500.0000 [IU] | INTRAVENOUS | Status: AC | PRN
  Administered 2017-07-20: 500 [IU]

## 2017-07-21 ENCOUNTER — Telehealth: Payer: Self-pay | Admitting: *Deleted

## 2017-07-21 NOTE — Telephone Encounter (Signed)
t states she left some paperwork for [place] to fill out yesterday and she has a question regarding that, Please return her call [phone]

## 2017-07-21 NOTE — Telephone Encounter (Signed)
I have not seen any paperwork at this time.

## 2017-07-21 NOTE — Telephone Encounter (Signed)
Paperwork is [employer], I left message for Marlowe Kays to Call pt

## 2017-08-15 ENCOUNTER — Ambulatory Visit
Admission: RE | Admit: 2017-08-15 | Discharge: 2017-08-15 | Disposition: A | Discharge status: Home / Self Care | Payer: BLUE CROSS/BLUE SHIELD | Source: Ambulatory Visit | Attending: Oncology | Admitting: Oncology

## 2017-08-15 DIAGNOSIS — R180 Malignant ascites: Secondary | ICD-10-CM | POA: Insufficient documentation

## 2017-08-15 DIAGNOSIS — C801 Malignant (primary) neoplasm, unspecified: Secondary | ICD-10-CM | POA: Insufficient documentation

## 2017-08-15 DIAGNOSIS — C786 Secondary malignant neoplasm of retroperitoneum and peritoneum: Secondary | ICD-10-CM | POA: Diagnosis present

## 2017-08-15 MED ORDER — IOPAMIDOL (ISOVUE-300) INJECTION 61%
100.0000 mL | Freq: Once | INTRAVENOUS | Status: AC | PRN
  Administered 2017-08-15: 100 mL via INTRAVENOUS

## 2017-08-17 ENCOUNTER — Encounter: Payer: Self-pay | Admitting: Oncology

## 2017-08-17 ENCOUNTER — Inpatient Hospital Stay: Payer: BLUE CROSS/BLUE SHIELD | Attending: Oncology | Admitting: Oncology

## 2017-08-17 VITALS — BP 118/73 | HR 83 | Temp 97.4°F | Resp 15 | Wt 156.0 lb

## 2017-08-17 DIAGNOSIS — Z87891 Personal history of nicotine dependence: Secondary | ICD-10-CM | POA: Diagnosis not present

## 2017-08-17 DIAGNOSIS — C786 Secondary malignant neoplasm of retroperitoneum and peritoneum: Secondary | ICD-10-CM | POA: Diagnosis not present

## 2017-08-17 DIAGNOSIS — Z79899 Other long term (current) drug therapy: Secondary | ICD-10-CM | POA: Insufficient documentation

## 2017-08-17 DIAGNOSIS — R918 Other nonspecific abnormal finding of lung field: Secondary | ICD-10-CM | POA: Insufficient documentation

## 2017-08-17 DIAGNOSIS — M5136 Other intervertebral disc degeneration, lumbar region: Secondary | ICD-10-CM

## 2017-08-17 DIAGNOSIS — C801 Malignant (primary) neoplasm, unspecified: Secondary | ICD-10-CM | POA: Diagnosis not present

## 2017-08-17 DIAGNOSIS — E785 Hyperlipidemia, unspecified: Secondary | ICD-10-CM | POA: Diagnosis not present

## 2017-08-17 DIAGNOSIS — Z9221 Personal history of antineoplastic chemotherapy: Secondary | ICD-10-CM | POA: Diagnosis not present

## 2017-08-17 DIAGNOSIS — Z452 Encounter for adjustment and management of vascular access device: Secondary | ICD-10-CM | POA: Insufficient documentation

## 2017-08-17 DIAGNOSIS — K219 Gastro-esophageal reflux disease without esophagitis: Secondary | ICD-10-CM | POA: Insufficient documentation

## 2017-08-17 DIAGNOSIS — R188 Other ascites: Secondary | ICD-10-CM | POA: Diagnosis not present

## 2017-08-17 DIAGNOSIS — Z7689 Persons encountering health services in other specified circumstances: Secondary | ICD-10-CM | POA: Diagnosis not present

## 2017-08-17 DIAGNOSIS — D696 Thrombocytopenia, unspecified: Secondary | ICD-10-CM | POA: Diagnosis not present

## 2017-08-19 NOTE — Progress Notes (Signed)
[facility]  [phone] [phone]  ID: Valerie Horton OB: [birth year]  MR#: [id number]  KNL#:[id number]  Patient Care Team: Valerie Roys, DO as PCP - General (Family Medicine) Clent Jacks, RN as Registered Nurse Garnetta Buddy, MD as Consulting Physician (Internal Medicine)  CHIEF COMPLAINT: Peritoneal carcinomatosis, metastatic adenocarcinoma of likely lower GI primary.  INTERVAL HISTORY: Patient returns to clinic today for further evaluation and discussion of her imaging results. She currently feels well and is asymptomatic. She has no neurological complaints. She denies any recent fevers or illnesses.  She has no chest pain or shortness of breath.  She denies and nausea, vomiting, constipation, or diarrhea. She does not complain of bloating or abdominal pain today. She has no melena or hematochezia. She has no urinary complaints.  Patient offers no specific complaints.  REVIEW OF SYSTEMS:   Review of Systems  Constitutional: Negative for fever, malaise/fatigue and weight loss.  Respiratory: Negative.  Negative for cough and shortness of breath.   Cardiovascular: Negative.  Negative for chest pain.  Gastrointestinal: Negative for abdominal pain, blood in stool, constipation, diarrhea, melena and vomiting.  Genitourinary: Negative.   Musculoskeletal: Negative.   Neurological: Negative.  Negative for sensory change and weakness.  Psychiatric/Behavioral: Negative.  The patient is not nervous/anxious.     As per HPI. Otherwise, a complete review of systems is negative.  PAST MEDICAL HISTORY: Past Medical History:  Diagnosis Date  . Acid reflux   . Allergic rhinitis   . Anxiety   . Arthritis    left knee  . Cervical spondylosis   . CTS (carpal tunnel syndrome)    Right  . Diverticulosis   . HSV-2 (herpes simplex virus 2) infection   . Hyperlipidemia   . Ovarian failure   . Peritoneal carcinomatosis ([place]) 01/2016   chemo  tx's.   . Rosacea   . Wears contact lenses     PAST SURGICAL HISTORY: Past Surgical History:  Procedure Laterality Date  . ANTERIOR CRUCIATE LIGAMENT REPAIR Left   . CESAREAN SECTION    . COLONOSCOPY WITH PROPOFOL N/A 01/29/2016   Procedure: COLONOSCOPY WITH PROPOFOL;  Surgeon: Lucilla Lame, MD;  Location: [facility];  Service: Endoscopy;  Laterality: N/A;  . ESOPHAGOGASTRODUODENOSCOPY (EGD) WITH PROPOFOL N/A 01/29/2016   Procedure: ESOPHAGOGASTRODUODENOSCOPY (EGD) WITH PROPOFOL;  Surgeon: Lucilla Lame, MD;  Location: [facility];  Service: Endoscopy;  Laterality: N/A;  . PERIPHERAL VASCULAR CATHETERIZATION N/A 02/10/2016   Procedure: Glori Luis Cath Insertion;  Surgeon: Algernon Huxley, MD;  Location: [facility];  Service: Cardiovascular;  Laterality: N/A;    FAMILY HISTORY: Unknown.  Patient is adopted.     ADVANCED DIRECTIVES:    HEALTH MAINTENANCE: Social History   Tobacco Use  . Smoking status: Former Research scientist (life sciences)  . Smokeless tobacco: Never Used  . Tobacco comment: Quit in her 65's  Substance Use Topics  . Alcohol use: Yes    Alcohol/week: 0.0 oz    Comment: 1 drink/mo  . Drug use: No     Colonoscopy:  PAP:  Bone density:  Lipid panel:  No Known Allergies  Current Outpatient Medications  Medication Sig Dispense Refill  . acyclovir (ZOVIRAX) 800 MG tablet TAKE 1 TABLET BY MOUTH EVERY DAY 90 tablet 1  . escitalopram (LEXAPRO) 10 MG tablet TAKE 1 TABLET BY MOUTH EVERY DAY 90 tablet 1  . fluticasone (FLONASE) 50 MCG/ACT nasal spray Place into both nostrils daily.    Marland Kitchen gabapentin (NEURONTIN)  300 MG capsule Take 300 mg by mouth at bedtime.    . lidocaine-prilocaine (EMLA) cream Apply 1 application topically as needed. 30 g 3  . naproxen (NAPROSYN) 500 MG tablet Take 1 tablet (500 mg total) by mouth 2 (two) times daily with a meal. 90 tablet 1  . ondansetron (ZOFRAN) 8 MG tablet Take by mouth every 8 (eight) hours as needed for nausea or vomiting.    .  pantoprazole (PROTONIX) 40 MG tablet TAKE 1 TABLET BY MOUTH EVERY DAY 30 tablet 2  . prochlorperazine (COMPAZINE) 10 MG tablet Take 10 mg by mouth every 6 (six) hours as needed for nausea or vomiting.     No current facility-administered medications for this visit.    Facility-Administered Medications Ordered in Other Visits  Medication Dose Route Frequency Provider Last Rate Last Dose  . heparin lock flush 100 unit/mL  500 Units Intravenous Once Lloyd Huger, MD      . sodium chloride flush (NS) 0.9 % injection 10 mL  10 mL Intravenous PRN Lloyd Huger, MD   10 mL at 08/16/16 0925  . sodium chloride flush (NS) 0.9 % injection 10 mL  10 mL Intracatheter PRN Lloyd Huger, MD   10 mL at 01/26/17 1343    OBJECTIVE: Vitals:   08/17/17 0929  BP: 118/73  Pulse: 83  Resp: 15  Temp: (!) 97.4 F (36.3 C)     Body mass index is 27.29 kg/m.    ECOG FS:0 - Asymptomatic  General: Well-developed, well-nourished, no acute distress. Eyes: Pink conjunctiva, anicteric sclera. HEENT: Clear oropharynx without ulceration or erythema. Lungs: Clear to auscultation bilaterally. Heart: Regular rate and rhythm. No rubs, murmurs, or gallops. Abdomen: Nontender, nondistended, normoactive bowel sounds. Musculoskeletal: No edema, cyanosis, or clubbing. Neuro: Alert, answering all questions appropriately. Cranial nerves grossly intact. Skin: No rashes or petechiae noted. Psych: Normal affect.   LAB RESULTS:  Lab Results  Component Value Date   NA 134 (L) 06/08/2017   K 4.2 06/08/2017   CL 102 06/08/2017   CO2 25 06/08/2017   GLUCOSE 97 06/08/2017   BUN 17 06/08/2017   CREATININE 0.62 06/08/2017   CALCIUM 9.2 06/08/2017   PROT 7.6 06/08/2017   ALBUMIN 4.1 06/08/2017   AST 34 06/08/2017   ALT 28 06/08/2017   ALKPHOS 155 (H) 06/08/2017   BILITOT 0.6 06/08/2017   GFRNONAA >60 06/08/2017   GFRAA >60 06/08/2017    Lab Results  Component Value Date   WBC 5.7 06/08/2017    NEUTROABS 3.3 06/08/2017   HGB 13.5 06/08/2017   HCT 40.4 06/08/2017   MCV 92.4 06/08/2017   PLT 161 06/08/2017     STUDIES: Ct Chest W Contrast  Result Date: 08/15/2017 CLINICAL DATA:  Metastatic adenocarcinoma with peritoneal carcinomatosis, status post chemotherapy, for restaging. EXAM: CT CHEST, ABDOMEN, AND PELVIS WITH CONTRAST TECHNIQUE: Multidetector CT imaging of the chest, abdomen and pelvis was performed following the standard protocol during bolus administration of intravenous contrast. CONTRAST:  18mL ISOVUE-300 IOPAMIDOL (ISOVUE-300) INJECTION 61% COMPARISON:  06/06/2017 FINDINGS: CT CHEST FINDINGS Cardiovascular: Heart is normal in size.  No pericardial effusion. No evidence of thoracic aortic aneurysm. Right chest port terminating in the lower SVC. Mediastinum/Nodes: No suspicious mediastinal lymphadenopathy. Visualized thyroid is unremarkable. Lungs/Pleura: Mild biapical pleural-parenchymal scarring. Mild dependent atelectasis in the bilateral lower lobes. Stable 3 mm subpleural nodule in the left upper lobe (series 3/image 31). Stable 2 mm subpleural nodule anteriorly in the right middle lobe (series 3/image  93). No focal consolidation. No pleural effusion or pneumothorax. Musculoskeletal: Mild degenerative changes of the lower thoracic spine. CT ABDOMEN PELVIS FINDINGS Hepatobiliary: Liver is notable for a mildly macronodular contour. No focal hepatic lesion is seen. Gallbladder is unremarkable. No intrahepatic or extrahepatic ductal dilatation. Pancreas: Within normal limits. Spleen: Within normal limits. Adrenals/Urinary Tract: Adrenal glands are within normal limits. Kidneys are within normal limits.  No hydronephrosis. Bladder is within normal limits. Stomach/Bowel: Stomach is within normal limits. No evidence of bowel obstruction. Appendix is not discretely visualized. Vascular/Lymphatic: No evidence of abdominal aortic aneurysm. No suspicious abdominopelvic lymphadenopathy.  Reproductive: Uterus is within normal limits. No adnexal masses. Other: Mild to moderate abdominopelvic ascites, grossly unchanged. Associated peritoneal disease with omental caking, difficult to discretely measure but subjectively mildly decreased. Musculoskeletal: Mild degenerative changes of the upper lumbar spine. IMPRESSION: Peritoneal disease with omental caking in the abdomen/pelvis, subjectively mildly decreased. Mild-to-moderate dominant pelvic ascites, grossly unchanged. No findings suspicious for metastatic disease in the chest. Electronically Signed   By: Julian Hy M.D.   On: 08/15/2017 14:28   Ct Abdomen Pelvis W Contrast  Result Date: 08/15/2017 CLINICAL DATA:  Metastatic adenocarcinoma with peritoneal carcinomatosis, status post chemotherapy, for restaging. EXAM: CT CHEST, ABDOMEN, AND PELVIS WITH CONTRAST TECHNIQUE: Multidetector CT imaging of the chest, abdomen and pelvis was performed following the standard protocol during bolus administration of intravenous contrast. CONTRAST:  166mL ISOVUE-300 IOPAMIDOL (ISOVUE-300) INJECTION 61% COMPARISON:  06/06/2017 FINDINGS: CT CHEST FINDINGS Cardiovascular: Heart is normal in size.  No pericardial effusion. No evidence of thoracic aortic aneurysm. Right chest port terminating in the lower SVC. Mediastinum/Nodes: No suspicious mediastinal lymphadenopathy. Visualized thyroid is unremarkable. Lungs/Pleura: Mild biapical pleural-parenchymal scarring. Mild dependent atelectasis in the bilateral lower lobes. Stable 3 mm subpleural nodule in the left upper lobe (series 3/image 31). Stable 2 mm subpleural nodule anteriorly in the right middle lobe (series 3/image 93). No focal consolidation. No pleural effusion or pneumothorax. Musculoskeletal: Mild degenerative changes of the lower thoracic spine. CT ABDOMEN PELVIS FINDINGS Hepatobiliary: Liver is notable for a mildly macronodular contour. No focal hepatic lesion is seen. Gallbladder is unremarkable.  No intrahepatic or extrahepatic ductal dilatation. Pancreas: Within normal limits. Spleen: Within normal limits. Adrenals/Urinary Tract: Adrenal glands are within normal limits. Kidneys are within normal limits.  No hydronephrosis. Bladder is within normal limits. Stomach/Bowel: Stomach is within normal limits. No evidence of bowel obstruction. Appendix is not discretely visualized. Vascular/Lymphatic: No evidence of abdominal aortic aneurysm. No suspicious abdominopelvic lymphadenopathy. Reproductive: Uterus is within normal limits. No adnexal masses. Other: Mild to moderate abdominopelvic ascites, grossly unchanged. Associated peritoneal disease with omental caking, difficult to discretely measure but subjectively mildly decreased. Musculoskeletal: Mild degenerative changes of the upper lumbar spine. IMPRESSION: Peritoneal disease with omental caking in the abdomen/pelvis, subjectively mildly decreased. Mild-to-moderate dominant pelvic ascites, grossly unchanged. No findings suspicious for metastatic disease in the chest. Electronically Signed   By: Julian Hy M.D.   On: 08/15/2017 14:28    ASSESSMENT: Peritoneal carcinomatosis, metastatic adenocarcinoma of likely lower GI primary.  PLAN:    1. Peritoneal carcinomatosis: Pathology results reviewed, immunohistochemistry suggested lower GI primary.  CT scan results from August 15, 2017 reviewed independently and report as above with subjective decrease in omental caking.  Her last paracentesis was on December 23, 2016. Of note, patient had a normal colonoscopy on October 16, 2014 in [place], [place]. Because of suspected progression of disease, patient's treatment was switched to FOLFIRI. [facility] also had [facility] who determined that no surgical or intraperitoneal chemotherapy would be beneficial. Foundation 1 testing did not reveal any actionable mutations. She does not require treatment at this time.  Her last treatment  occurred on October 2-4, 2018. Return to clinic in 3 months with repeat imaging and further evaluation.  Patient expressed understanding that she likely will require to reinitiate treatment at some point in future which will be considered cycle 19 of FOLFIRI plus Avastin. 2: Genetic testing: Negative. 3. History of neutropenia: Neulasta with treatments. 4. Peripheral neuropathy: Resolved. 5. Ascites: Patient had a paracentesis on December 23, 2016 which removed 700 mL of fluid. Her last paracentesis prior to this was on September 16, 2016 when 3.5 L of fluid was removed. 6. Tongue lesion: Resolved.  Approximately 30 minutes was spent in discussion of which greater than 50% was consultation.  Patient expressed understanding and was in agreement with this plan. She also understands that She can call clinic at any time with any questions, concerns, or complaints.     Lloyd Huger, MD 08/19/17 8:55 AM

## 2017-08-22 ENCOUNTER — Ambulatory Visit: Payer: BLUE CROSS/BLUE SHIELD | Admitting: Oncology

## 2017-08-29 ENCOUNTER — Encounter: Payer: Self-pay | Admitting: Family Medicine

## 2017-08-29 ENCOUNTER — Encounter: Payer: Self-pay | Admitting: Oncology

## 2017-08-31 ENCOUNTER — Inpatient Hospital Stay: Payer: BLUE CROSS/BLUE SHIELD

## 2017-08-31 DIAGNOSIS — C786 Secondary malignant neoplasm of retroperitoneum and peritoneum: Secondary | ICD-10-CM | POA: Diagnosis not present

## 2017-08-31 DIAGNOSIS — Z95828 Presence of other vascular implants and grafts: Secondary | ICD-10-CM

## 2017-08-31 MED ORDER — SODIUM CHLORIDE 0.9% FLUSH
10.0000 mL | Freq: Once | INTRAVENOUS | Status: DC
  Filled 2017-08-31: qty 10

## 2017-08-31 MED ORDER — HEPARIN SOD (PORK) LOCK FLUSH 100 UNIT/ML IV SOLN
500.0000 [IU] | Freq: Once | INTRAVENOUS | Status: AC
  Administered 2017-08-31: 500 [IU] via INTRAVENOUS

## 2017-09-04 ENCOUNTER — Ambulatory Visit: Payer: BLUE CROSS/BLUE SHIELD

## 2017-09-06 ENCOUNTER — Other Ambulatory Visit: Payer: Self-pay | Admitting: Family Medicine

## 2017-09-07 ENCOUNTER — Ambulatory Visit: Payer: BLUE CROSS/BLUE SHIELD | Admitting: Oncology

## 2017-09-08 ENCOUNTER — Other Ambulatory Visit: Payer: Self-pay | Admitting: Oncology

## 2017-09-08 DIAGNOSIS — Z7189 Other specified counseling: Secondary | ICD-10-CM

## 2017-09-08 DIAGNOSIS — C786 Secondary malignant neoplasm of retroperitoneum and peritoneum: Secondary | ICD-10-CM

## 2017-09-08 DIAGNOSIS — C801 Malignant (primary) neoplasm, unspecified: Secondary | ICD-10-CM

## 2017-09-11 ENCOUNTER — Ambulatory Visit: Payer: BLUE CROSS/BLUE SHIELD

## 2017-09-15 ENCOUNTER — Encounter: Payer: Self-pay | Admitting: Family Medicine

## 2017-09-21 ENCOUNTER — Inpatient Hospital Stay: Payer: BLUE CROSS/BLUE SHIELD | Attending: Oncology

## 2017-09-28 ENCOUNTER — Other Ambulatory Visit: Payer: Self-pay | Admitting: *Deleted

## 2017-09-28 ENCOUNTER — Telehealth: Payer: Self-pay | Admitting: *Deleted

## 2017-09-28 ENCOUNTER — Ambulatory Visit (INDEPENDENT_AMBULATORY_CARE_PROVIDER_SITE_OTHER): Payer: BLUE CROSS/BLUE SHIELD | Admitting: Family Medicine

## 2017-09-28 ENCOUNTER — Encounter: Payer: Self-pay | Admitting: Family Medicine

## 2017-09-28 VITALS — BP 114/73 | HR 82 | Ht 62.6 in | Wt 157.5 lb

## 2017-09-28 DIAGNOSIS — Z1231 Encounter for screening mammogram for malignant neoplasm of breast: Secondary | ICD-10-CM

## 2017-09-28 DIAGNOSIS — F411 Generalized anxiety disorder: Secondary | ICD-10-CM

## 2017-09-28 DIAGNOSIS — Z23 Encounter for immunization: Secondary | ICD-10-CM

## 2017-09-28 DIAGNOSIS — F419 Anxiety disorder, unspecified: Secondary | ICD-10-CM | POA: Diagnosis not present

## 2017-09-28 DIAGNOSIS — Z0000 Encounter for general adult medical examination without abnormal findings: Secondary | ICD-10-CM

## 2017-09-28 DIAGNOSIS — C801 Malignant (primary) neoplasm, unspecified: Secondary | ICD-10-CM

## 2017-09-28 DIAGNOSIS — Z0001 Encounter for general adult medical examination with abnormal findings: Secondary | ICD-10-CM | POA: Diagnosis not present

## 2017-09-28 DIAGNOSIS — Z1239 Encounter for other screening for malignant neoplasm of breast: Secondary | ICD-10-CM

## 2017-09-28 DIAGNOSIS — C786 Secondary malignant neoplasm of retroperitoneum and peritoneum: Secondary | ICD-10-CM

## 2017-09-28 DIAGNOSIS — E785 Hyperlipidemia, unspecified: Secondary | ICD-10-CM

## 2017-09-28 DIAGNOSIS — R188 Other ascites: Secondary | ICD-10-CM

## 2017-09-28 NOTE — Progress Notes (Signed)
BP 114/73 (BP Location: Left Arm, Patient Position: Sitting, Cuff Size: Normal)   Pulse 82   Ht 5' 2.6" (1.59 m)   Wt 157 lb 8 oz (71.4 kg)   SpO2 98%   BMI 28.26 kg/m    Subjective:    Patient ID: Valerie Horton, female    DOB: 1961-04-21, 57 y.o.   MRN: [id number]  HPI: Valerie Horton is a 57 y.o. female presenting on 09/28/2017 for comprehensive medical examination. Current medical complaints include:  Plastic surgeon changed her from naproxen to celebrex to avoid bleeding. Having surgery on Monday. Has some tramadol if she needs it. Notes that the naproxen works better, but is hoping that she'll be feeling better after surgery.  Has been having some numbness in her fingers- unsure if it's from the chemo. Has been having a lot of tightness and soreness in her hands. Was better on her her naproxen. Hoping it will get better again.  Has been doing really well with her cancer. Has gained some weight and wants to make sure that she has not been having fluid build up again.   Having some frequency of stool, no diarrhea, but some lose stools, Having excess gas. Has also been noticing since stopping chemo, when she goes to the bathroom, has gas or mucous. Not noticing any bulging. Has been doing some kegals, but has not tried anything else.  Otherwise doing well with no other concerns or complaints at this time  She currently lives with: husband Menopausal Symptoms: no  Depression Screen done today and results listed below:  Depression screen [facility] 2/9 06/13/2017  Decreased Interest 0  Down, Depressed, Hopeless 0  PHQ - 2 Score 0  Altered sleeping 0  Tired, decreased energy 0  Change in appetite 0  Feeling bad or failure about yourself  0  Trouble concentrating 0  Moving slowly or fidgety/restless 0  Suicidal thoughts 0  PHQ-9 Score 0  Difficult doing work/chores Not difficult at all    Past Medical History:  Past Medical History:  Diagnosis Date  . Acid reflux   .  Allergic rhinitis   . Anxiety   . Arthritis    left knee  . Cervical spondylosis   . CTS (carpal tunnel syndrome)    Right  . Diverticulosis   . HSV-2 (herpes simplex virus 2) infection   . Hyperlipidemia   . Ovarian failure   . Peritoneal carcinomatosis ([place]) 01/2016   chemo tx's.   . Rosacea   . Wears contact lenses     Surgical History:  Past Surgical History:  Procedure Laterality Date  . ANTERIOR CRUCIATE LIGAMENT REPAIR Left   . CESAREAN SECTION    . COLONOSCOPY WITH PROPOFOL N/A 01/29/2016   Procedure: COLONOSCOPY WITH PROPOFOL;  Surgeon: Lucilla Lame, MD;  Location: [facility];  Service: Endoscopy;  Laterality: N/A;  . ESOPHAGOGASTRODUODENOSCOPY (EGD) WITH PROPOFOL N/A 01/29/2016   Procedure: ESOPHAGOGASTRODUODENOSCOPY (EGD) WITH PROPOFOL;  Surgeon: Lucilla Lame, MD;  Location: [facility];  Service: Endoscopy;  Laterality: N/A;  . PERIPHERAL VASCULAR CATHETERIZATION N/A 02/10/2016   Procedure: Glori Luis Cath Insertion;  Surgeon: Algernon Huxley, MD;  Location: [facility];  Service: Cardiovascular;  Laterality: N/A;    Medications:  Current Outpatient Medications on File Prior to Visit  Medication Sig  . acyclovir (ZOVIRAX) 800 MG tablet TAKE 1 TABLET BY MOUTH EVERY DAY  . celecoxib (CELEBREX) 200 MG capsule   . escitalopram (LEXAPRO) 10 MG tablet TAKE  1 TABLET BY MOUTH DAILY  . fluticasone (FLONASE) 50 MCG/ACT nasal spray Place into both nostrils daily.  Marland Kitchen gabapentin (NEURONTIN) 300 MG capsule Take 300 mg by mouth at bedtime.  Marland Kitchen HYDROcodone-acetaminophen (NORCO/VICODIN) 5-325 MG tablet TAKE 1 TABLET BY MOUTH EVERY 4 TO 6 HOURS AS NEEDED FOR PAIN WITH FOOD  . lidocaine-prilocaine (EMLA) cream Apply 1 application topically as needed.  . naproxen (NAPROSYN) 500 MG tablet Take 1 tablet (500 mg total) by mouth 2 (two) times daily with a meal.  . ondansetron (ZOFRAN) 8 MG tablet Take by mouth every 8 (eight) hours as needed for nausea or vomiting.  .  ondansetron (ZOFRAN-ODT) 8 MG disintegrating tablet DISSOLVE 1 TABLET ON TONGUE EVERY 6 TO 8 HOURS AS NEEDED FOR NAUSEA OR VOMITING  . pantoprazole (PROTONIX) 40 MG tablet TAKE 1 TABLET BY MOUTH EVERY DAY  . prochlorperazine (COMPAZINE) 10 MG tablet Take 10 mg by mouth every 6 (six) hours as needed for nausea or vomiting.  . traMADol (ULTRAM) 50 MG tablet TAKE 1/2 TABLET (25MG ) BY MOUTH EVERY MORNING   Current Facility-Administered Medications on File Prior to Visit  Medication  . heparin lock flush 100 unit/mL  . sodium chloride flush (NS) 0.9 % injection 10 mL  . sodium chloride flush (NS) 0.9 % injection 10 mL    Allergies:  No Known Allergies  Social History:  Social History   Socioeconomic History  . Marital status: Married    Spouse name: Not on file  . Number of children: 3  . Years of education: Not on file  . Highest education level: Not on file  Occupational History  . Occupation: Agricultural engineer  Social Needs  . Financial resource strain: Not on file  . Food insecurity:    Worry: Not on file    Inability: Not on file  . Transportation needs:    Medical: Not on file    Non-medical: Not on file  Tobacco Use  . Smoking status: Former Research scientist (life sciences)  . Smokeless tobacco: Never Used  . Tobacco comment: Quit in her 3's  Substance and Sexual Activity  . Alcohol use: Yes    Alcohol/week: 0.0 oz    Comment: 1 drink/mo  . Drug use: No  . Sexual activity: Yes  Lifestyle  . Physical activity:    Days per week: Not on file    Minutes per session: Not on file  . Stress: Not on file  Relationships  . Social connections:    Talks on phone: Not on file    Gets together: Not on file    Attends religious service: Not on file    Active member of club or organization: Not on file    Attends meetings of clubs or organizations: Not on file    Relationship status: Not on file  . Intimate partner violence:    Fear of current or ex partner: Not on file    Emotionally abused: Not on  file    Physically abused: Not on file    Forced sexual activity: Not on file  Other Topics Concern  . Not on file  Social History Narrative  . Not on file   Social History   Tobacco Use  Smoking Status Former Smoker  Smokeless Tobacco Never Used  Tobacco Comment   Quit in her 45's   Social History   Substance and Sexual Activity  Alcohol Use Yes  . Alcohol/week: 0.0 oz   Comment: 1 drink/mo    Family History:  Family  History  Adopted: Yes    Past medical history, surgical history, medications, allergies, family history and social history reviewed with patient today and changes made to appropriate areas of the chart.   Review of Systems  Constitutional: Negative.   HENT: Negative.   Eyes: Negative.   Respiratory: Negative.   Cardiovascular: Negative.   Gastrointestinal: Positive for diarrhea and heartburn (does well on protonix). Negative for abdominal pain, blood in stool, constipation, melena, nausea and vomiting.  Genitourinary: Negative.   Musculoskeletal: Negative.   Skin: Negative.   Neurological: Positive for tingling. Negative for dizziness, tremors, sensory change, speech change, focal weakness, seizures, loss of consciousness, weakness and headaches.  Endo/Heme/Allergies: Positive for environmental allergies. Negative for polydipsia. Does not bruise/bleed easily.  Psychiatric/Behavioral: Negative.     All other ROS negative except what is listed above and in the HPI.      Objective:    BP 114/73 (BP Location: Left Arm, Patient Position: Sitting, Cuff Size: Normal)   Pulse 82   Ht 5' 2.6" (1.59 m)   Wt 157 lb 8 oz (71.4 kg)   SpO2 98%   BMI 28.26 kg/m   Wt Readings from Last 3 Encounters:  09/28/17 157 lb 8 oz (71.4 kg)  08/17/17 156 lb (70.8 kg)  06/13/17 144 lb 9 oz (65.6 kg)    Physical Exam  Constitutional: She is oriented to person, place, and time. She appears well-developed and well-nourished. No distress.  HENT:  Head: Normocephalic  and atraumatic.  Right Ear: Hearing, tympanic membrane, external ear and ear canal normal.  Left Ear: Hearing, tympanic membrane, external ear and ear canal normal.  Nose: Nose normal.  Mouth/Throat: Uvula is midline, oropharynx is clear and moist and mucous membranes are normal. No oropharyngeal exudate.  Eyes: Pupils are equal, round, and reactive to light. Conjunctivae, EOM and lids are normal. Right eye exhibits no discharge. Left eye exhibits no discharge. No scleral icterus.  Neck: Normal range of motion. Neck supple. No JVD present. No tracheal deviation present. No thyromegaly present.  Cardiovascular: Normal rate, regular rhythm, normal heart sounds and intact distal pulses. Exam reveals no gallop and no friction rub.  No murmur heard. Pulmonary/Chest: Effort normal and breath sounds normal. No stridor. No respiratory distress. She has no wheezes. She has no rales. She exhibits no tenderness. Right breast exhibits no inverted nipple, no mass, no nipple discharge, no skin change and no tenderness. Left breast exhibits no inverted nipple, no mass, no nipple discharge, no skin change and no tenderness. Breasts are symmetrical.  Abdominal: Soft. Bowel sounds are normal. She exhibits ascites (small fluid wave). She exhibits no distension and no mass. There is no tenderness. There is no rebound and no guarding.  Genitourinary:  Genitourinary Comments: Genital exam deferred with shared decision making  Musculoskeletal: Normal range of motion. She exhibits no edema, tenderness or deformity.  Lymphadenopathy:    She has no cervical adenopathy.  Neurological: She is alert and oriented to person, place, and time. She has normal reflexes. She displays normal reflexes. No cranial nerve deficit. She exhibits normal muscle tone. Coordination normal.  Skin: Skin is warm, dry and intact. No rash noted. She is not diaphoretic. No erythema. No pallor.  Psychiatric: She has a normal mood and affect. Her  speech is normal and behavior is normal. Judgment and thought content normal. Cognition and memory are normal.  Nursing note and vitals reviewed.   Results for orders placed or performed in visit on 06/13/17  [employer]  DCA Hb A1c Waived  Result Value Ref Range   Bayer DCA Hb A1c Waived 5.1 <7.0 %  Thyroid Panel With TSH  Result Value Ref Range   TSH 3.140 0.450 - 4.500 uIU/mL   T4, Total 7.0 4.5 - 12.0 ug/dL   T3 Uptake Ratio 21 (L) 24 - 39 %   Free Thyroxine Index 1.5 1.2 - 4.9  VITAMIN D 25 Hydroxy (Vit-D Deficiency, Fractures)  Result Value Ref Range   Vit D, 25-Hydroxy 29.5 (L) 30.0 - 100.0 ng/mL      Assessment & Plan:   Problem List Items Addressed This Visit      Other   Peritoneal carcinomatosis ([place])    Seems to have a small fluid wave today. Contact made with her oncologist- they are setting up paracentesis. Call with any concerns.       Relevant Medications   ondansetron (ZOFRAN-ODT) 8 MG disintegrating tablet   Anxiety disorder   Relevant Orders   CBC with Differential/Platelet   TSH   Hyperlipidemia LDL goal <130    Rechecking levels today. Await results. Call with any concerns.       Relevant Orders   Comprehensive metabolic panel   Lipid Panel w/o Chol/HDL Ratio   Anxiety    Under good control. Continue current regimen. Continue to monitor. Call with any concerns.        Other Visit Diagnoses    Routine general medical examination at a health care facility    -  Primary   Vaccines up to date. Screening labs checked today. Mammogram ordered. Pap to be done next visit. Call with any concerns. Continue diet and exercise.    Relevant Orders   CBC with Differential/Platelet   Comprehensive metabolic panel   Lipid Panel w/o Chol/HDL Ratio   TSH   UA/M w/rflx Culture, Routine   Screening for breast cancer       Mammogram ordered today.   Relevant Orders   MM DIGITAL SCREENING BILATERAL   Need for Tdap vaccination       Tdap given today   Relevant  Orders   Tdap vaccine greater than or equal to 7yo IM (Completed)       Follow up plan: Return in about 6 months (around 03/31/2018).   LABORATORY TESTING:  - Pap smear: up to date  IMMUNIZATIONS:   - Tdap: Tetanus vaccination status reviewed: last tetanus booster within 10 years. - Influenza: Up to date - Pneumovax: Up to date  SCREENING: -Mammogram: Ordered today  - Colonoscopy: Up to date   PATIENT COUNSELING:   Advised to take 1 mg of folate supplement per day if capable of pregnancy.   Sexuality: Discussed sexually transmitted diseases, partner selection, use of condoms, avoidance of unintended pregnancy  and contraceptive alternatives.   Advised to avoid cigarette smoking.  I discussed with the patient that most people either abstain from alcohol or drink within safe limits (<=14/week and <=4 drinks/occasion for males, <=7/weeks and <= 3 drinks/occasion for females) and that the risk for alcohol disorders and other health effects rises proportionally with the number of drinks per week and how often a drinker exceeds daily limits.  Discussed cessation/primary prevention of drug use and availability of treatment for abuse.   Diet: Encouraged to adjust caloric intake to maintain  or achieve ideal body weight, to reduce intake of dietary saturated fat and total fat, to limit sodium intake by avoiding high sodium foods and not adding table salt, and to maintain adequate  dietary potassium and calcium preferably from fresh fruits, vegetables, and low-fat dairy products.    stressed the importance of regular exercise  Injury prevention: Discussed safety belts, safety helmets, smoke detector, smoking near bedding or upholstery.   Dental health: Discussed importance of regular tooth brushing, flossing, and dental visits.    NEXT PREVENTATIVE PHYSICAL DUE IN 1 YEAR. Return in about 6 months (around 03/31/2018).

## 2017-09-28 NOTE — Telephone Encounter (Signed)
Dr Wynetta Emery is asking that Dr Grayland Ormond order an US guided paracentesis Patient feels she has fluid and Dr Wynetta Emery felt a fluid wave on examination. Please advise

## 2017-09-28 NOTE — Telephone Encounter (Signed)
Per Dr. Grayland Ormond patient may have paracentesis, orders entered and form faxed.

## 2017-09-28 NOTE — Telephone Encounter (Signed)
Tiffany informed that order was faxed I spoke with Corey Skains

## 2017-09-28 NOTE — Assessment & Plan Note (Signed)
Rechecking levels today. Await results. Call with any concerns.  

## 2017-09-28 NOTE — Assessment & Plan Note (Signed)
Under good control. Continue current regimen. Continue to monitor. Call with any concerns. 

## 2017-09-28 NOTE — Assessment & Plan Note (Signed)
Seems to have a small fluid wave today. Contact made with her oncologist- they are setting up paracentesis. Call with any concerns.

## 2017-09-29 LAB — CBC WITH DIFFERENTIAL/PLATELET
BASOS ABS: 0 10*3/uL (ref 0.0–0.2)
BASOS: 0 %
EOS (ABSOLUTE): 0 10*3/uL (ref 0.0–0.4)
Eos: 1 %
Hematocrit: 35.3 % (ref 34.0–46.6)
Hemoglobin: 12 g/dL (ref 11.1–15.9)
IMMATURE GRANULOCYTES: 0 %
Immature Grans (Abs): 0 10*3/uL (ref 0.0–0.1)
LYMPHS ABS: 1.7 10*3/uL (ref 0.7–3.1)
LYMPHS: 27 %
MCH: 28.6 pg (ref 26.6–33.0)
MCHC: 34 g/dL (ref 31.5–35.7)
MCV: 84 fL (ref 79–97)
MONOS ABS: 0.5 10*3/uL (ref 0.1–0.9)
Monocytes: 9 %
Neutrophils Absolute: 3.9 10*3/uL (ref 1.4–7.0)
Neutrophils: 63 %
Platelets: 309 10*3/uL (ref 150–379)
RBC: 4.19 x10E6/uL (ref 3.77–5.28)
RDW: 14.3 % (ref 12.3–15.4)
WBC: 6.2 10*3/uL (ref 3.4–10.8)

## 2017-09-29 LAB — COMPREHENSIVE METABOLIC PANEL
ALK PHOS: 166 IU/L — AB (ref 39–117)
ALT: 14 IU/L (ref 0–32)
AST: 17 IU/L (ref 0–40)
Albumin/Globulin Ratio: 1.3 (ref 1.2–2.2)
Albumin: 3.8 g/dL (ref 3.5–5.5)
BUN/Creatinine Ratio: 23 (ref 9–23)
BUN: 17 mg/dL (ref 6–24)
Bilirubin Total: <0.2 mg/dL (ref 0.0–1.2)
CALCIUM: 9 mg/dL (ref 8.7–10.2)
CO2: 23 mmol/L (ref 20–29)
Chloride: 102 mmol/L (ref 96–106)
Creatinine, Ser: 0.74 mg/dL (ref 0.57–1.00)
GFR calc Af Amer: 103 mL/min/{1.73_m2} (ref >59)
GFR, EST NON AFRICAN AMERICAN: 90 mL/min/{1.73_m2} (ref >59)
GLUCOSE: 87 mg/dL (ref 65–99)
Globulin, Total: 2.9 g/dL (ref 1.5–4.5)
Potassium: 4.4 mmol/L (ref 3.5–5.2)
Sodium: 139 mmol/L (ref 134–144)
Total Protein: 6.7 g/dL (ref 6.0–8.5)

## 2017-09-29 LAB — LIPID PANEL W/O CHOL/HDL RATIO
CHOLESTEROL TOTAL: 229 mg/dL — AB (ref 100–199)
HDL: 61 mg/dL (ref >39)
LDL CALC: 153 mg/dL — AB (ref 0–99)
TRIGLYCERIDES: 76 mg/dL (ref 0–149)
VLDL CHOLESTEROL CAL: 15 mg/dL (ref 5–40)

## 2017-09-29 LAB — TSH: TSH: 1.95 u[IU]/mL (ref 0.450–4.500)

## 2017-10-01 LAB — URINE CULTURE, REFLEX

## 2017-10-01 LAB — UA/M W/RFLX CULTURE, ROUTINE
Bilirubin, UA: NEGATIVE
Glucose, UA: NEGATIVE
Ketones, UA: NEGATIVE
NITRITE UA: NEGATIVE
PROTEIN UA: NEGATIVE
RBC, UA: NEGATIVE
Specific Gravity, UA: 1.02 (ref 1.005–1.030)
Urobilinogen, Ur: 0.2 mg/dL (ref 0.2–1.0)
pH, UA: 5.5 (ref 5.0–7.5)

## 2017-10-01 LAB — MICROSCOPIC EXAMINATION: RBC, UA: NONE SEEN /hpf (ref 0–2)

## 2017-10-05 NOTE — Discharge Instructions (Signed)
Paracentesis, Care After °Refer to this sheet in the next few weeks. These instructions provide you with information about caring for yourself after your procedure. Your health care provider may also give you more specific instructions. Your treatment has been planned according to current medical practices, but problems sometimes occur. Call your health care provider if you have any problems or questions after your procedure. °What can I expect after the procedure? °After your procedure, it is common to have a small amount of clear fluid coming from the puncture site. °Follow these instructions at home: °· Return to your normal activities as told by your health care provider. Ask your health care provider what activities are safe for you. °· Take over-the-counter and prescription medicines only as told by your health care provider. °· Do not take baths, swim, or use a hot tub until your health care provider approves. °· Follow instructions from your health care provider about: °? How to take care of your puncture site. °? When and how you should change your bandage (dressing). °? When you should remove your dressing. °· Check your puncture area every day signs of infection. Watch for: °? Redness, swelling, or pain. °? Fluid, blood, or pus. °· Keep all follow-up visits as told by your health care provider. This is important. °Contact a health care provider if: °· You have redness, swelling, or pain at your puncture site. °· You start to have more clear fluid coming from your puncture site. °· You have blood or pus coming from your puncture site. °· You have chills. °· You have a fever. °Get help right away if: °· You develop chest pain or shortness of breath. °· You develop increasing pain, discomfort, or swelling in your abdomen. °· You feel dizzy or light-headed or you pass out. °This information is not intended to replace advice given to you by your health care provider. Make sure you discuss any questions you  have with your health care provider. °Document Released: 11/04/2014 Document Revised: 11/26/2015 Document Reviewed: 09/02/2014 °Elsevier Interactive Patient Education © 2018 [employer] ° °

## 2017-10-11 ENCOUNTER — Ambulatory Visit
Admission: RE | Admit: 2017-10-11 | Discharge: 2017-10-11 | Disposition: A | Discharge status: Home / Self Care | Payer: BLUE CROSS/BLUE SHIELD | Source: Ambulatory Visit | Attending: Oncology | Admitting: Oncology

## 2017-10-11 DIAGNOSIS — R188 Other ascites: Secondary | ICD-10-CM | POA: Diagnosis present

## 2017-10-11 NOTE — Procedures (Signed)
Interventional Radiology Procedure Note  Procedure: US guided paracentesis of small volume ascites.  Complications: None Recommendations:  - Ok to shower tomorrow - Do not submerge for 7 days - Routine care   Signed,  Dulcy Fanny. Earleen Newport, DO

## 2017-10-12 ENCOUNTER — Encounter: Payer: Self-pay | Admitting: Oncology

## 2017-10-23 ENCOUNTER — Other Ambulatory Visit: Payer: Self-pay | Admitting: Oncology

## 2017-10-23 DIAGNOSIS — Z7189 Other specified counseling: Secondary | ICD-10-CM

## 2017-10-23 DIAGNOSIS — C786 Secondary malignant neoplasm of retroperitoneum and peritoneum: Secondary | ICD-10-CM

## 2017-10-23 DIAGNOSIS — C801 Malignant (primary) neoplasm, unspecified: Secondary | ICD-10-CM

## 2017-10-26 ENCOUNTER — Inpatient Hospital Stay: Payer: BLUE CROSS/BLUE SHIELD | Attending: Oncology

## 2017-10-26 ENCOUNTER — Ambulatory Visit
Admission: RE | Admit: 2017-10-26 | Discharge: 2017-10-26 | Disposition: A | Discharge status: Home / Self Care | Payer: BLUE CROSS/BLUE SHIELD | Source: Ambulatory Visit | Attending: Oncology | Admitting: Oncology

## 2017-10-26 DIAGNOSIS — Z79899 Other long term (current) drug therapy: Secondary | ICD-10-CM | POA: Insufficient documentation

## 2017-10-26 DIAGNOSIS — C786 Secondary malignant neoplasm of retroperitoneum and peritoneum: Secondary | ICD-10-CM | POA: Diagnosis present

## 2017-10-26 DIAGNOSIS — R197 Diarrhea, unspecified: Secondary | ICD-10-CM | POA: Insufficient documentation

## 2017-10-26 DIAGNOSIS — R188 Other ascites: Secondary | ICD-10-CM | POA: Diagnosis not present

## 2017-10-26 DIAGNOSIS — R911 Solitary pulmonary nodule: Secondary | ICD-10-CM | POA: Insufficient documentation

## 2017-10-26 DIAGNOSIS — Z87891 Personal history of nicotine dependence: Secondary | ICD-10-CM | POA: Insufficient documentation

## 2017-10-26 DIAGNOSIS — K219 Gastro-esophageal reflux disease without esophagitis: Secondary | ICD-10-CM | POA: Insufficient documentation

## 2017-10-26 DIAGNOSIS — Z9221 Personal history of antineoplastic chemotherapy: Secondary | ICD-10-CM | POA: Insufficient documentation

## 2017-10-26 DIAGNOSIS — E785 Hyperlipidemia, unspecified: Secondary | ICD-10-CM | POA: Insufficient documentation

## 2017-10-26 DIAGNOSIS — C801 Malignant (primary) neoplasm, unspecified: Secondary | ICD-10-CM | POA: Insufficient documentation

## 2017-10-26 MED ORDER — IOPAMIDOL (ISOVUE-300) INJECTION 61%
100.0000 mL | Freq: Once | INTRAVENOUS | Status: AC | PRN
  Administered 2017-10-26: 100 mL via INTRAVENOUS

## 2017-10-29 NOTE — Progress Notes (Signed)
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  INTERVAL HISTORY: Patient returns to clinic today for further evaluation and discussion of her imaging results.  She also recently had a paracentesis which removed 1.2 L of fluid.  She otherwise feels well and is asymptomatic. She has no neurological complaints. She denies any recent fevers or illnesses.  She has no chest pain or shortness of breath.  She has some mild bloating, but denies any abdominal pain.  She denies any nausea, vomiting, or constipation.  She has occasional diarrhea and has noted some change in the consistency of her stool describing as "pencillike" with increased mucus.  She has no melena or hematochezia. She has no urinary complaints.  Patient offers no further specific complaints today.  REVIEW OF SYSTEMS:   Review of Systems  Constitutional: Negative.  Negative for fever, malaise/fatigue and weight loss.  Respiratory: Negative.  Negative for cough and shortness of breath.   Cardiovascular: Negative.  Negative for chest pain and leg swelling.  Gastrointestinal: Negative.  Negative for abdominal pain, blood in stool, constipation, diarrhea, melena and vomiting.  Genitourinary: Negative.   Musculoskeletal: Negative.   Skin: Negative.  Negative for rash.  Neurological: Negative.  Negative for sensory change, focal weakness and weakness.  Psychiatric/Behavioral: Negative.  The patient is not nervous/anxious.     As per HPI. Otherwise, a complete review of systems is negative.  PAST MEDICAL HISTORY: Past Medical History:  Diagnosis Date  . Acid  reflux   . Allergic rhinitis   . Anxiety   . Arthritis    left knee  . Cervical spondylosis   . CTS (carpal tunnel syndrome)    Right  . Diverticulosis   . HSV-2 (herpes simplex virus 2) infection   . Hyperlipidemia   . Ovarian failure   . Peritoneal carcinomatosis ([place]) 01/2016   chemo tx's.   . Rosacea   . Wears contact lenses     PAST SURGICAL HISTORY: Past Surgical History:  Procedure Laterality Date  . ANTERIOR CRUCIATE LIGAMENT REPAIR Left   . CESAREAN SECTION    . COLONOSCOPY WITH PROPOFOL N/A 01/29/2016   Procedure: COLONOSCOPY WITH PROPOFOL;  Surgeon: Lucilla Lame, MD;  Location: [facility];  Service: Endoscopy;  Laterality: N/A;  . ESOPHAGOGASTRODUODENOSCOPY (EGD) WITH PROPOFOL N/A 01/29/2016   Procedure: ESOPHAGOGASTRODUODENOSCOPY (EGD) WITH PROPOFOL;  Surgeon: Lucilla Lame, MD;  Location: [facility];  Service: Endoscopy;  Laterality: N/A;  . PERIPHERAL VASCULAR CATHETERIZATION N/A 02/10/2016   Procedure: Glori Luis Cath Insertion;  Surgeon: Algernon Huxley, MD;  Location: [facility];  Service: Cardiovascular;  Laterality: N/A;    FAMILY HISTORY: Unknown.  Patient is adopted.     ADVANCED DIRECTIVES:    HEALTH MAINTENANCE: Social History   Tobacco Use  . Smoking status: Former Research scientist (life sciences)  . Smokeless tobacco: Never Used  . Tobacco comment: Quit in her 16's  Substance Use Topics  . Alcohol use: Yes    Alcohol/week: 0.0 oz    Comment: 1 drink/mo  . Drug use: No     Colonoscopy:  PAP:  Bone density:  Lipid panel:  No Known Allergies  Current Outpatient Medications  Medication Sig  Dispense Refill  . acyclovir (ZOVIRAX) 800 MG tablet TAKE 1 TABLET BY MOUTH EVERY DAY 90 tablet 1  . Cholecalciferol 1000 units capsule Take 1,000 Units by mouth daily.     Marland Kitchen escitalopram (LEXAPRO) 10 MG tablet TAKE 1 TABLET BY MOUTH DAILY 90 tablet 1  . naproxen (NAPROSYN) 500 MG tablet Take 1 tablet (500 mg total) by mouth 2 (two) times daily with a meal.  (Patient taking differently: Take 500 mg by mouth 2 (two) times daily with a meal. ) 90 tablet 1  . pantoprazole (PROTONIX) 40 MG tablet TAKE 1 TABLET BY MOUTH EVERY DAY 30 tablet 0  . fluticasone (FLONASE) 50 MCG/ACT nasal spray Place into both nostrils daily.    Marland Kitchen gabapentin (NEURONTIN) 300 MG capsule Take 300 mg by mouth at bedtime.    Marland Kitchen HYDROcodone-acetaminophen (NORCO/VICODIN) 5-325 MG tablet TAKE 1 TABLET BY MOUTH EVERY 4 TO 6 HOURS AS NEEDED FOR PAIN WITH FOOD  0  . lidocaine-prilocaine (EMLA) cream Apply 1 application topically as needed. (Patient not taking: Reported on 10/31/2017) 30 g 3  . ondansetron (ZOFRAN) 8 MG tablet Take by mouth every 8 (eight) hours as needed for nausea or vomiting.    . ondansetron (ZOFRAN-ODT) 8 MG disintegrating tablet DISSOLVE 1 TABLET ON TONGUE EVERY 6 TO 8 HOURS AS NEEDED FOR NAUSEA OR VOMITING  0  . prochlorperazine (COMPAZINE) 10 MG tablet Take 10 mg by mouth every 6 (six) hours as needed for nausea or vomiting.     No current facility-administered medications for this visit.    Facility-Administered Medications Ordered in Other Visits  Medication Dose Route Frequency Provider Last Rate Last Dose  . heparin lock flush 100 unit/mL  500 Units Intravenous Once Lloyd Huger, MD      . sodium chloride flush (NS) 0.9 % injection 10 mL  10 mL Intravenous PRN Lloyd Huger, MD   10 mL at 08/16/16 0925  . sodium chloride flush (NS) 0.9 % injection 10 mL  10 mL Intracatheter PRN Lloyd Huger, MD   10 mL at 01/26/17 1343    OBJECTIVE: Vitals:   10/31/17 1456  BP: 117/70  Pulse: 94  Resp: 18  Temp: (!) 97.4 F (36.3 C)     Body mass index is 28.15 kg/m.    ECOG FS:0 - Asymptomatic  General: Well-developed, well-nourished, no acute distress. Eyes: Pink conjunctiva, anicteric sclera. HEENT: Normocephalic, moist mucous membranes, clear oropharnyx. Lungs: Clear to auscultation bilaterally. Heart: Regular rate and rhythm. No rubs,  murmurs, or gallops. Abdomen: Soft, mildly distended.  Nontender. Musculoskeletal: No edema, cyanosis, or clubbing. Neuro: Alert, answering all questions appropriately. Cranial nerves grossly intact. Skin: No rashes or petechiae noted. Psych: Normal affect.  LAB RESULTS:  Lab Results  Component Value Date   NA 134 (L) 10/31/2017   K 3.7 10/31/2017   CL 101 10/31/2017   CO2 23 10/31/2017   GLUCOSE 129 (H) 10/31/2017   BUN 18 10/31/2017   CREATININE 0.79 10/31/2017   CALCIUM 9.0 10/31/2017   PROT 7.6 10/31/2017   ALBUMIN 3.6 10/31/2017   AST 23 10/31/2017   ALT 15 10/31/2017   ALKPHOS 126 10/31/2017   BILITOT 0.6 10/31/2017   GFRNONAA >60 10/31/2017   GFRAA >60 10/31/2017    Lab Results  Component Value Date   WBC 7.2 10/31/2017   NEUTROABS 4.4 10/31/2017   HGB 11.6 (L) 10/31/2017   HCT 34.2 (L) 10/31/2017   MCV 84.0 10/31/2017   PLT 297 10/31/2017  STUDIES: Ct Chest W Contrast  Result Date: 10/26/2017 CLINICAL DATA:  Restaging peritoneal carcinomatosis. Suspect lower GI primary. EXAM: CT CHEST, ABDOMEN, AND PELVIS WITH CONTRAST TECHNIQUE: Multidetector CT imaging of the chest, abdomen and pelvis was performed following the standard protocol during bolus administration of intravenous contrast. CONTRAST:  193mL ISOVUE-300 IOPAMIDOL (ISOVUE-300) INJECTION 61% COMPARISON:  CT CAP 06/14/2018. FINDINGS: CT CHEST FINDINGS Cardiovascular: Normal heart size. No pericardial effusion identified. Aortic atherosclerosis. Mediastinum/Nodes: Similar appearance of mild soft tissue stranding within the anterior mediastinal fat which may reflect thymic hyperplasia. Normal appearance of the thyroid gland. The trachea appears patent and is midline. No mediastinal or hilar adenopathy. No axillary or supraclavicular adenopathy. Lungs/Pleura: No pleural effusion. Left upper lobe lung nodule is unchanged measuring 3 mm, image 25/3. Unchanged. No new pulmonary nodules identified. Musculoskeletal:  No aggressive lytic or sclerotic bone lesions. Spondylosis within the thoracic spine. CT ABDOMEN PELVIS FINDINGS Hepatobiliary: Macro nodular contour the liver noted. No focal liver abnormality. The gallbladder is normal. No biliary dilatation. Pancreas: Unremarkable. No pancreatic ductal dilatation or surrounding inflammatory changes. Spleen: Normal in size without focal abnormality. Adrenals/Urinary Tract: The adrenal glands appear normal. No kidney mass or hydronephrosis noted bilaterally. Urinary bladder negative. Stomach/Bowel: Stomach is within normal limits. No evidence of bowel wall thickening, distention, or inflammatory changes. Vascular/Lymphatic: Normal appearance of the abdominal aorta. No enlarged mesenteric or retroperitoneal lymph nodes. No pelvic or inguinal adenopathy. Reproductive: Uterus and bilateral adnexa are unremarkable. Other: There is a moderate volume of ascites within the abdomen and pelvis which appears partially loculated. The volume of ascites is increased in the interval. Pocket of loculated fluid ventral to the pancreas measures 6.1 x 4.3 cm. This is compared with 4.8 x 4.7 cm previously.Associated peritoneal disease with omental caking is again noted. This is difficult to discretely measure but appears subjectively similar to previous exam. Musculoskeletal: No aggressive lytic or sclerotic bone lesions. IMPRESSION: 1. Again noted is a moderate volume of partially loculated with omental caking. The volume of ascites appears increased in the interval. 2. No findings for metastatic disease within the chest. Electronically Signed   By: Kerby Moors M.D.   On: 10/26/2017 17:20   Ct Abdomen Pelvis W Contrast  Result Date: 10/26/2017 CLINICAL DATA:  Restaging peritoneal carcinomatosis. Suspect lower GI primary. EXAM: CT CHEST, ABDOMEN, AND PELVIS WITH CONTRAST TECHNIQUE: Multidetector CT imaging of the chest, abdomen and pelvis was performed following the standard protocol during  bolus administration of intravenous contrast. CONTRAST:  137mL ISOVUE-300 IOPAMIDOL (ISOVUE-300) INJECTION 61% COMPARISON:  CT CAP 06/14/2018. FINDINGS: CT CHEST FINDINGS Cardiovascular: Normal heart size. No pericardial effusion identified. Aortic atherosclerosis. Mediastinum/Nodes: Similar appearance of mild soft tissue stranding within the anterior mediastinal fat which may reflect thymic hyperplasia. Normal appearance of the thyroid gland. The trachea appears patent and is midline. No mediastinal or hilar adenopathy. No axillary or supraclavicular adenopathy. Lungs/Pleura: No pleural effusion. Left upper lobe lung nodule is unchanged measuring 3 mm, image 25/3. Unchanged. No new pulmonary nodules identified. Musculoskeletal: No aggressive lytic or sclerotic bone lesions. Spondylosis within the thoracic spine. CT ABDOMEN PELVIS FINDINGS Hepatobiliary: Macro nodular contour the liver noted. No focal liver abnormality. The gallbladder is normal. No biliary dilatation. Pancreas: Unremarkable. No pancreatic ductal dilatation or surrounding inflammatory changes. Spleen: Normal in size without focal abnormality. Adrenals/Urinary Tract: The adrenal glands appear normal. No kidney mass or hydronephrosis noted bilaterally. Urinary bladder negative. Stomach/Bowel: Stomach is within normal limits. No evidence of bowel wall thickening, distention, or inflammatory  changes. Vascular/Lymphatic: Normal appearance of the abdominal aorta. No enlarged mesenteric or retroperitoneal lymph nodes. No pelvic or inguinal adenopathy. Reproductive: Uterus and bilateral adnexa are unremarkable. Other: There is a moderate volume of ascites within the abdomen and pelvis which appears partially loculated. The volume of ascites is increased in the interval. Pocket of loculated fluid ventral to the pancreas measures 6.1 x 4.3 cm. This is compared with 4.8 x 4.7 cm previously.Associated peritoneal disease with omental caking is again noted. This  is difficult to discretely measure but appears subjectively similar to previous exam. Musculoskeletal: No aggressive lytic or sclerotic bone lesions. IMPRESSION: 1. Again noted is a moderate volume of partially loculated with omental caking. The volume of ascites appears increased in the interval. 2. No findings for metastatic disease within the chest. Electronically Signed   By: Kerby Moors M.D.   On: 10/26/2017 17:20   US Paracentesis  Result Date: 10/11/2017 INDICATION: 57 year old female with a history of malignant ascites EXAM: ULTRASOUND GUIDED  PARACENTESIS MEDICATIONS: None COMPLICATIONS: None PROCEDURE: Informed written consent was obtained from the patient after a discussion of the risks, benefits and alternatives to treatment. A timeout was performed prior to the initiation of the procedure. Initial ultrasound scanning demonstrates a small amount of ascites within the right lower abdominal quadrant. The right lower abdomen was prepped and draped in the usual sterile fashion. 1% lidocaine with epinephrine was used for local anesthesia. Following this, a 8 Fr Safe-T-Centesis catheter was introduced. An ultrasound image was saved for documentation purposes. The paracentesis was performed. The catheter was removed and a dressing was applied. The patient tolerated the procedure well without immediate post procedural complication. FINDINGS: A total of approximately 1.2 L of thin yellow fluid was removed. IMPRESSION: Status post ultrasound-guided paracentesis. Signed, Dulcy Fanny. Earleen Newport, DO [facility] Electronically Signed   By: Corrie Mckusick D.O.   On: 10/11/2017 10:38    ASSESSMENT: Peritoneal carcinomatosis, metastatic adenocarcinoma of likely lower GI primary.  PLAN:    1. Peritoneal carcinomatosis: Pathology results reviewed, immunohistochemistry suggested lower GI primary.  Patient received her last treatment of FOLFIRI plus Avastin was  on April 06, 2017. Patient also had consultation at [facility] and MD Ouida Sills who determined that no surgical or intraperitoneal chemotherapy would be beneficial. Foundation 1 testing did not reveal any actionable mutations.  CT scan results from October 11, 2017 reviewed independently and reported as above with no obvious progression or recurrent disease.  The omental caking is subjectively unchanged.  She did have a paracentesis on October 11, 2017 that removed 1.2 L of fluid. Of note, patient had a normal colonoscopy on October 16, 2014 in [place], [place].  No intervention is needed at this time.  Return to clinic in 3 months with repeat imaging and further evaluation. 2: Genetic testing: Negative. 3. History of neutropenia: Neulasta with treatments. 4. Peripheral neuropathy: Resolved. 5. Ascites: Patient had a paracentesis on October 11, 2017 and removed 1.2 L of fluid.  Prior to that she had 700 mL removed on December 23, 2016 and 3.5 L on September 16, 2016. 6.  Changes in stool: See HPI.  Patient was given a referral to GI for further evaluation.    Approximately 30 minutes was spent in discussion of which greater than 50% was consultation.   Patient expressed understanding and was in agreement with this plan. She also understands that She can call clinic at any time with any questions, concerns, or complaints.  Lloyd Huger, MD 11/01/17 11:00 PM

## 2017-10-31 ENCOUNTER — Encounter: Payer: Self-pay | Admitting: Oncology

## 2017-10-31 ENCOUNTER — Encounter: Payer: Self-pay | Admitting: *Deleted

## 2017-10-31 ENCOUNTER — Other Ambulatory Visit: Payer: Self-pay

## 2017-10-31 ENCOUNTER — Inpatient Hospital Stay: Payer: BLUE CROSS/BLUE SHIELD

## 2017-10-31 ENCOUNTER — Inpatient Hospital Stay (HOSPITAL_BASED_OUTPATIENT_CLINIC_OR_DEPARTMENT_OTHER): Payer: BLUE CROSS/BLUE SHIELD | Admitting: Oncology

## 2017-10-31 VITALS — BP 117/70 | HR 94 | Temp 97.4°F | Resp 18 | Wt 156.9 lb

## 2017-10-31 DIAGNOSIS — R188 Other ascites: Secondary | ICD-10-CM

## 2017-10-31 DIAGNOSIS — C786 Secondary malignant neoplasm of retroperitoneum and peritoneum: Secondary | ICD-10-CM

## 2017-10-31 DIAGNOSIS — K219 Gastro-esophageal reflux disease without esophagitis: Secondary | ICD-10-CM

## 2017-10-31 DIAGNOSIS — R197 Diarrhea, unspecified: Secondary | ICD-10-CM

## 2017-10-31 DIAGNOSIS — C801 Malignant (primary) neoplasm, unspecified: Secondary | ICD-10-CM | POA: Diagnosis not present

## 2017-10-31 DIAGNOSIS — R911 Solitary pulmonary nodule: Secondary | ICD-10-CM | POA: Diagnosis not present

## 2017-10-31 DIAGNOSIS — Z79899 Other long term (current) drug therapy: Secondary | ICD-10-CM

## 2017-10-31 DIAGNOSIS — Z9221 Personal history of antineoplastic chemotherapy: Secondary | ICD-10-CM | POA: Diagnosis not present

## 2017-10-31 DIAGNOSIS — Z87891 Personal history of nicotine dependence: Secondary | ICD-10-CM | POA: Diagnosis not present

## 2017-10-31 DIAGNOSIS — E785 Hyperlipidemia, unspecified: Secondary | ICD-10-CM

## 2017-10-31 DIAGNOSIS — Z95828 Presence of other vascular implants and grafts: Secondary | ICD-10-CM

## 2017-10-31 LAB — COMPREHENSIVE METABOLIC PANEL
ALT: 15 U/L (ref 14–54)
ANION GAP: 10 (ref 5–15)
AST: 23 U/L (ref 15–41)
Albumin: 3.6 g/dL (ref 3.5–5.0)
Alkaline Phosphatase: 126 U/L (ref 38–126)
BUN: 18 mg/dL (ref 6–20)
CHLORIDE: 101 mmol/L (ref 101–111)
CO2: 23 mmol/L (ref 22–32)
Calcium: 9 mg/dL (ref 8.9–10.3)
Creatinine, Ser: 0.79 mg/dL (ref 0.44–1.00)
GFR calc non Af Amer: >60 mL/min (ref >60)
Glucose, Bld: 129 mg/dL — ABNORMAL HIGH (ref 65–99)
Potassium: 3.7 mmol/L (ref 3.5–5.1)
SODIUM: 134 mmol/L — AB (ref 135–145)
Total Bilirubin: 0.6 mg/dL (ref 0.3–1.2)
Total Protein: 7.6 g/dL (ref 6.5–8.1)

## 2017-10-31 LAB — CBC WITH DIFFERENTIAL/PLATELET
BASOS PCT: 1 %
Basophils Absolute: 0.1 10*3/uL (ref 0–0.1)
Eosinophils Absolute: 0.1 10*3/uL (ref 0–0.7)
Eosinophils Relative: 2 %
HEMATOCRIT: 34.2 % — AB (ref 35.0–47.0)
HEMOGLOBIN: 11.6 g/dL — AB (ref 12.0–16.0)
LYMPHS ABS: 2.1 10*3/uL (ref 1.0–3.6)
LYMPHS PCT: 29 %
MCH: 28.6 pg (ref 26.0–34.0)
MCHC: 34 g/dL (ref 32.0–36.0)
MCV: 84 fL (ref 80.0–100.0)
MONOS PCT: 7 %
Monocytes Absolute: 0.5 10*3/uL (ref 0.2–0.9)
NEUTROS ABS: 4.4 10*3/uL (ref 1.4–6.5)
NEUTROS PCT: 61 %
Platelets: 297 10*3/uL (ref 150–440)
RBC: 4.07 MIL/uL (ref 3.80–5.20)
RDW: 15 % — ABNORMAL HIGH (ref 11.5–14.5)
WBC: 7.2 10*3/uL (ref 3.6–11.0)

## 2017-10-31 MED ORDER — HEPARIN SOD (PORK) LOCK FLUSH 100 UNIT/ML IV SOLN
500.0000 [IU] | INTRAVENOUS | Status: AC | PRN
  Administered 2017-10-31: 500 [IU]

## 2017-10-31 MED ORDER — SODIUM CHLORIDE 0.9% FLUSH
10.0000 mL | INTRAVENOUS | Status: AC | PRN
  Administered 2017-10-31: 10 mL
  Filled 2017-10-31: qty 10

## 2017-10-31 NOTE — Progress Notes (Signed)
Here for follow up. Per pt doing "good " concerned w results today.

## 2017-11-09 ENCOUNTER — Encounter: Payer: Self-pay | Admitting: Oncology

## 2017-11-10 ENCOUNTER — Encounter: Payer: Self-pay | Admitting: Family Medicine

## 2017-11-10 DIAGNOSIS — G8929 Other chronic pain: Secondary | ICD-10-CM

## 2017-11-10 DIAGNOSIS — M79644 Pain in right finger(s): Principal | ICD-10-CM

## 2017-11-13 ENCOUNTER — Ambulatory Visit: Payer: BLUE CROSS/BLUE SHIELD

## 2017-11-14 DIAGNOSIS — M65311 Trigger thumb, right thumb: Secondary | ICD-10-CM | POA: Insufficient documentation

## 2017-11-14 DIAGNOSIS — M1710 Unilateral primary osteoarthritis, unspecified knee: Secondary | ICD-10-CM | POA: Insufficient documentation

## 2017-11-14 DIAGNOSIS — M179 Osteoarthritis of knee, unspecified: Secondary | ICD-10-CM | POA: Insufficient documentation

## 2017-11-16 ENCOUNTER — Other Ambulatory Visit: Payer: BLUE CROSS/BLUE SHIELD

## 2017-11-16 ENCOUNTER — Ambulatory Visit: Payer: BLUE CROSS/BLUE SHIELD | Admitting: Oncology

## 2017-11-17 ENCOUNTER — Other Ambulatory Visit: Payer: Self-pay | Admitting: Oncology

## 2017-11-17 DIAGNOSIS — Z7189 Other specified counseling: Secondary | ICD-10-CM

## 2017-11-17 DIAGNOSIS — C786 Secondary malignant neoplasm of retroperitoneum and peritoneum: Secondary | ICD-10-CM

## 2017-11-17 DIAGNOSIS — C801 Malignant (primary) neoplasm, unspecified: Secondary | ICD-10-CM

## 2017-12-11 ENCOUNTER — Other Ambulatory Visit: Payer: Self-pay | Admitting: Oncology

## 2017-12-11 DIAGNOSIS — C786 Secondary malignant neoplasm of retroperitoneum and peritoneum: Secondary | ICD-10-CM

## 2017-12-11 DIAGNOSIS — C801 Malignant (primary) neoplasm, unspecified: Secondary | ICD-10-CM

## 2017-12-11 DIAGNOSIS — Z7189 Other specified counseling: Secondary | ICD-10-CM

## 2017-12-12 ENCOUNTER — Inpatient Hospital Stay: Payer: BLUE CROSS/BLUE SHIELD | Attending: Oncology

## 2017-12-12 DIAGNOSIS — C801 Malignant (primary) neoplasm, unspecified: Secondary | ICD-10-CM | POA: Insufficient documentation

## 2017-12-12 DIAGNOSIS — Z452 Encounter for adjustment and management of vascular access device: Secondary | ICD-10-CM | POA: Diagnosis not present

## 2017-12-12 DIAGNOSIS — C786 Secondary malignant neoplasm of retroperitoneum and peritoneum: Secondary | ICD-10-CM | POA: Insufficient documentation

## 2017-12-12 DIAGNOSIS — Z95828 Presence of other vascular implants and grafts: Secondary | ICD-10-CM

## 2017-12-12 MED ORDER — SODIUM CHLORIDE 0.9% FLUSH
10.0000 mL | Freq: Once | INTRAVENOUS | Status: AC
  Administered 2017-12-12: 10 mL via INTRAVENOUS
  Filled 2017-12-12: qty 10

## 2017-12-12 MED ORDER — HEPARIN SOD (PORK) LOCK FLUSH 100 UNIT/ML IV SOLN
500.0000 [IU] | Freq: Once | INTRAVENOUS | Status: AC
  Administered 2017-12-12: 500 [IU] via INTRAVENOUS

## 2017-12-13 ENCOUNTER — Encounter: Payer: Self-pay | Admitting: Oncology

## 2017-12-14 ENCOUNTER — Encounter: Payer: Self-pay | Admitting: Oncology

## 2017-12-15 ENCOUNTER — Other Ambulatory Visit: Payer: Self-pay | Admitting: *Deleted

## 2017-12-15 DIAGNOSIS — R188 Other ascites: Secondary | ICD-10-CM

## 2017-12-18 ENCOUNTER — Ambulatory Visit
Admission: RE | Admit: 2017-12-18 | Discharge: 2017-12-18 | Disposition: A | Discharge status: Home / Self Care | Payer: BLUE CROSS/BLUE SHIELD | Source: Ambulatory Visit | Attending: Oncology | Admitting: Oncology

## 2017-12-18 ENCOUNTER — Encounter: Payer: Self-pay | Admitting: Gastroenterology

## 2017-12-18 ENCOUNTER — Other Ambulatory Visit: Payer: Self-pay

## 2017-12-18 ENCOUNTER — Ambulatory Visit: Payer: BLUE CROSS/BLUE SHIELD | Admitting: Gastroenterology

## 2017-12-18 ENCOUNTER — Ambulatory Visit: Payer: BLUE CROSS/BLUE SHIELD

## 2017-12-18 ENCOUNTER — Other Ambulatory Visit: Payer: Self-pay | Admitting: Oncology

## 2017-12-18 ENCOUNTER — Telehealth: Payer: Self-pay | Admitting: Oncology

## 2017-12-18 VITALS — BP 113/57 | HR 91 | Ht 62.0 in | Wt 160.0 lb

## 2017-12-18 DIAGNOSIS — R194 Change in bowel habit: Secondary | ICD-10-CM | POA: Diagnosis not present

## 2017-12-18 DIAGNOSIS — K921 Melena: Secondary | ICD-10-CM | POA: Diagnosis not present

## 2017-12-18 DIAGNOSIS — R6881 Early satiety: Secondary | ICD-10-CM

## 2017-12-18 DIAGNOSIS — R188 Other ascites: Secondary | ICD-10-CM

## 2017-12-18 NOTE — Progress Notes (Signed)
Primary Care Physician: Valerie Roys, DO  Primary Gastroenterologist:  Dr. Lucilla Lame  No chief complaint on file.   HPI: Valerie Horton is a 57 y.o. female here for follow-up of peritoneal carcinomatosis.  The patient had a colonoscopy in 2016 and then developed ascites with cytology showing malignant cells consistent with a GI malignancy.  The patient underwent an EGD and colonoscopy by me in July 2017.  The patient did not have any source of the malignancy seen.  The patient did have a cecal appendiceal orifice lesion that was removed that did not show any cancerous or precancerous tissue.  Repeat CT scan showed omental caking without any sign of the source of her malignancy.  The patient is undergoing restaging and has been sent to me for evaluation.  The patient reports that she has had a change in bowel habits with smaller caliber's of stool and some rectal bleeding.  She also reports that she has some increased bloating with some passage of mucus per rectum and feeling full fast after eating.  Current Outpatient Medications  Medication Sig Dispense Refill  . acyclovir (ZOVIRAX) 800 MG tablet TAKE 1 TABLET BY MOUTH EVERY DAY 90 tablet 1  . Cholecalciferol 1000 units capsule Take 1,000 Units by mouth daily.     Marland Kitchen escitalopram (LEXAPRO) 10 MG tablet TAKE 1 TABLET BY MOUTH DAILY 90 tablet 1  . fluticasone (FLONASE) 50 MCG/ACT nasal spray Place into both nostrils daily.    Marland Kitchen gabapentin (NEURONTIN) 300 MG capsule Take 300 mg by mouth at bedtime.    Marland Kitchen HYDROcodone-acetaminophen (NORCO/VICODIN) 5-325 MG tablet TAKE 1 TABLET BY MOUTH EVERY 4 TO 6 HOURS AS NEEDED FOR PAIN WITH FOOD  0  . lidocaine-prilocaine (EMLA) cream Apply 1 application topically as needed. (Patient not taking: Reported on 10/31/2017) 30 g 3  . naproxen (NAPROSYN) 500 MG tablet Take 1 tablet (500 mg total) by mouth 2 (two) times daily with a meal. (Patient taking differently: Take 500 mg by mouth 2 (two) times daily  with a meal. ) 90 tablet 1  . ondansetron (ZOFRAN) 8 MG tablet Take by mouth every 8 (eight) hours as needed for nausea or vomiting.    . ondansetron (ZOFRAN-ODT) 8 MG disintegrating tablet DISSOLVE 1 TABLET ON TONGUE EVERY 6 TO 8 HOURS AS NEEDED FOR NAUSEA OR VOMITING  0  . pantoprazole (PROTONIX) 40 MG tablet TAKE 1 TABLET BY MOUTH EVERY DAY 30 tablet 0  . prochlorperazine (COMPAZINE) 10 MG tablet Take 10 mg by mouth every 6 (six) hours as needed for nausea or vomiting.     No current facility-administered medications for this visit.    Facility-Administered Medications Ordered in Other Visits  Medication Dose Route Frequency Provider Last Rate Last Dose  . heparin lock flush 100 unit/mL  500 Units Intravenous Once Lloyd Huger, MD      . sodium chloride flush (NS) 0.9 % injection 10 mL  10 mL Intravenous PRN Lloyd Huger, MD   10 mL at 08/16/16 0925  . sodium chloride flush (NS) 0.9 % injection 10 mL  10 mL Intracatheter PRN Lloyd Huger, MD   10 mL at 01/26/17 1343    Allergies as of 12/18/2017  . (No Known Allergies)    ROS:  General: Negative for anorexia, weight loss, fever, chills, fatigue, weakness. ENT: Negative for hoarseness, difficulty swallowing , nasal congestion. CV: Negative for chest pain, angina, palpitations, dyspnea on exertion, peripheral edema.  Respiratory: Negative  for dyspnea at rest, dyspnea on exertion, cough, sputum, wheezing.  GI: See history of present illness. GU:  Negative for dysuria, hematuria, urinary incontinence, urinary frequency, nocturnal urination.  Endo: Negative for unusual weight change.    Physical Examination:   There were no vitals taken for this visit.  General: Well-nourished, well-developed in no acute distress.  Eyes: No icterus. Conjunctivae pink. Mouth: Oropharyngeal mucosa moist and pink , no lesions erythema or exudate. Lungs: Clear to auscultation bilaterally. Non-labored. Heart: Regular rate and  rhythm, no murmurs rubs or gallops.  Abdomen: Bowel sounds are normal, nontender, nondistended, no hepatosplenomegaly or masses, no abdominal bruits or hernia , no rebound or guarding.   Extremities: No lower extremity edema. No clubbing or deformities. Neuro: Alert and oriented x 3.  Grossly intact. Skin: Warm and dry, no jaundice.   Psych: Alert and cooperative, normal mood and affect.  Labs:    Imaging Studies: No results found.  Assessment and Plan:   Valerie Horton is a 57 y.o. y/o female who comes in with a history of peritoneal carcinomatosis with a recent change in her bowel habits with more mucus and rectal bleeding.  She also states that there is a change in the stool caliber.  The patient has a history of ascites and now reports that she is having early satiety.  There is no report of any unexplained weight loss.  The patient will be set up for an EGD and colonoscopy due to her rectal bleeding change in stool caliber passage of mucus per rectum and her early satiety. I have discussed risks & benefits which include, but are not limited to, bleeding, infection, perforation & drug reaction.  The patient agrees with this plan & written consent will be obtained.       Lucilla Lame, MD. [name]   Note: This dictation was prepared with Dragon dictation along with smaller phrase technology. Any transcriptional errors that result from this process are unintentional.

## 2017-12-18 NOTE — Telephone Encounter (Signed)
Left pt VM about scheduled Paracentesis on 12/18/17 @ 12pm. Appt message states that she has been notifiedd but I have made several attempts this morning to make sure.

## 2017-12-20 ENCOUNTER — Other Ambulatory Visit: Payer: Self-pay

## 2017-12-20 DIAGNOSIS — R6881 Early satiety: Secondary | ICD-10-CM

## 2017-12-20 DIAGNOSIS — R194 Change in bowel habit: Secondary | ICD-10-CM

## 2017-12-20 DIAGNOSIS — K921 Melena: Secondary | ICD-10-CM

## 2017-12-26 ENCOUNTER — Other Ambulatory Visit: Payer: Self-pay

## 2017-12-26 ENCOUNTER — Encounter: Payer: Self-pay | Admitting: *Deleted

## 2017-12-26 IMAGING — CT CT OUTSIDE FILMS CHEST
3 of 12 series · 14 of 46 positions shown, 20 images · IV contrast (ORAL & OMNIPAQUE)
Comparison: none

[Series 5: cap ax 2.5 · axial · 0.78mm/px · z∈[-542,-135]mm · 8 of 467 slices shown, 13 images]
[im 52/467  soft-tissue]
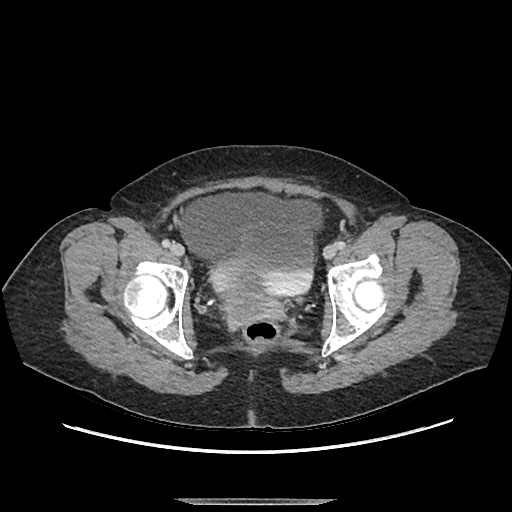
[im 52/467  bone]
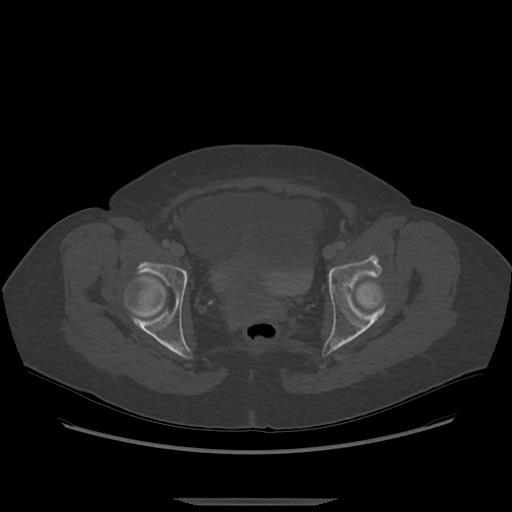
[im 104/467  soft-tissue]
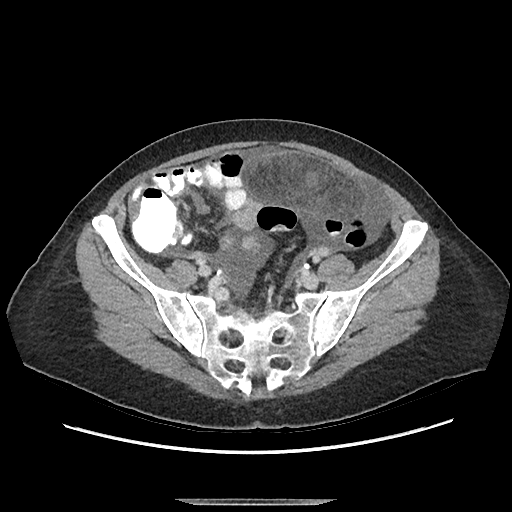
[im 156/467  soft-tissue]
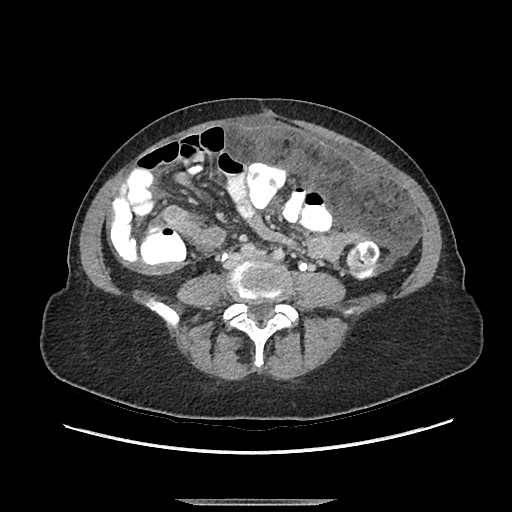
[im 208/467  soft-tissue]
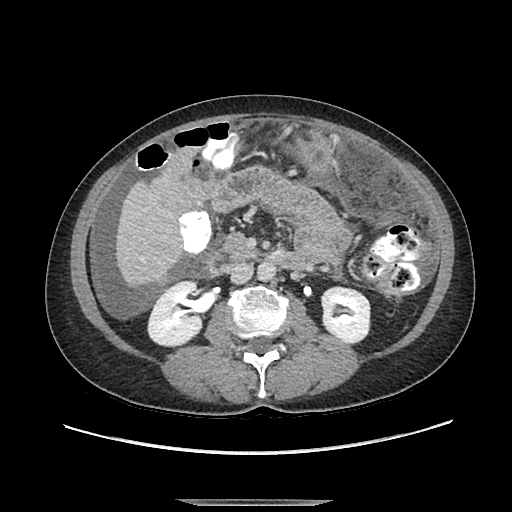
[im 259/467  soft-tissue]
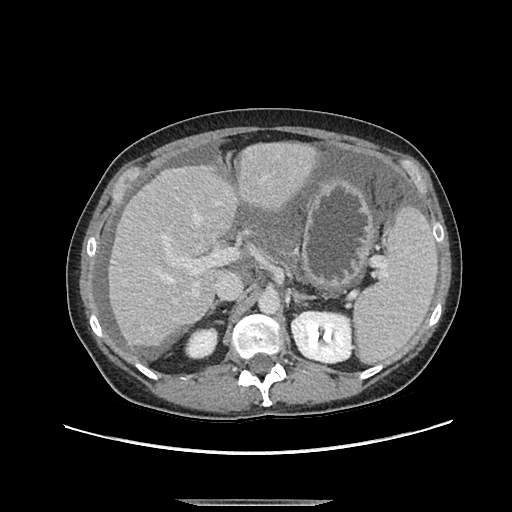
[im 259/467  lung]
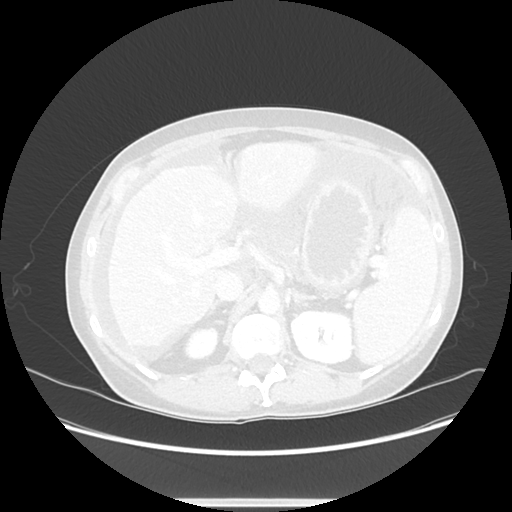
[im 311/467  soft-tissue]
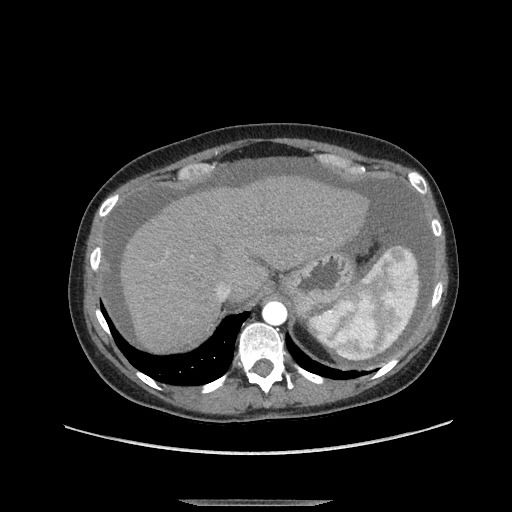
[im 311/467  lung]
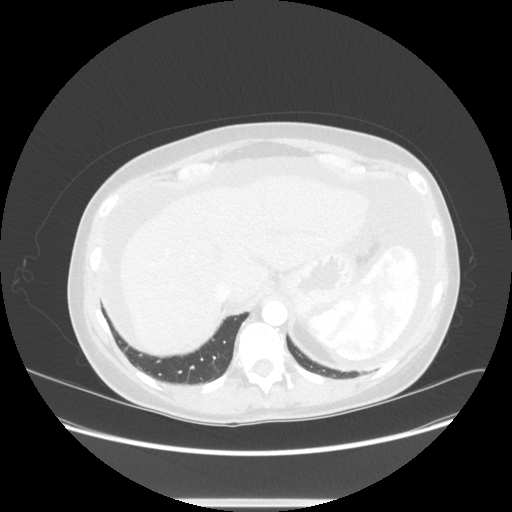
[im 363/467  soft-tissue]
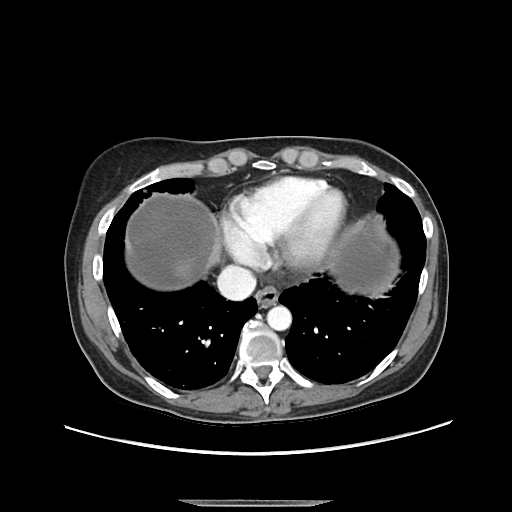
[im 363/467  lung]
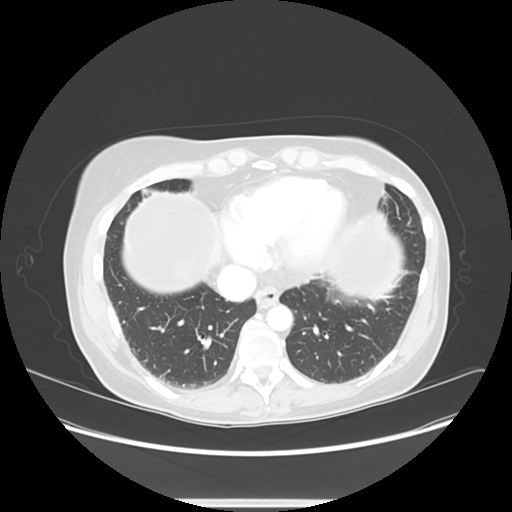
[im 415/467  soft-tissue]
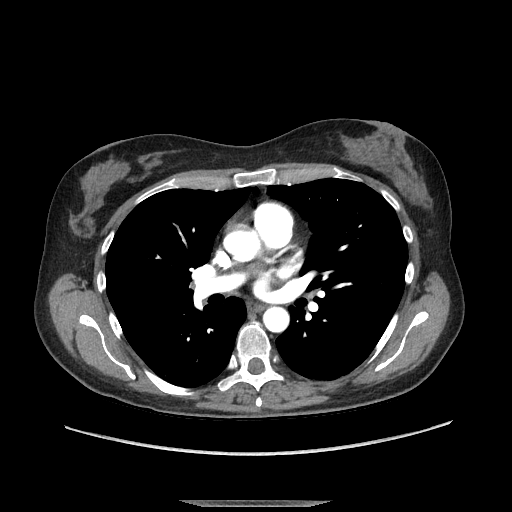
[im 415/467  lung]
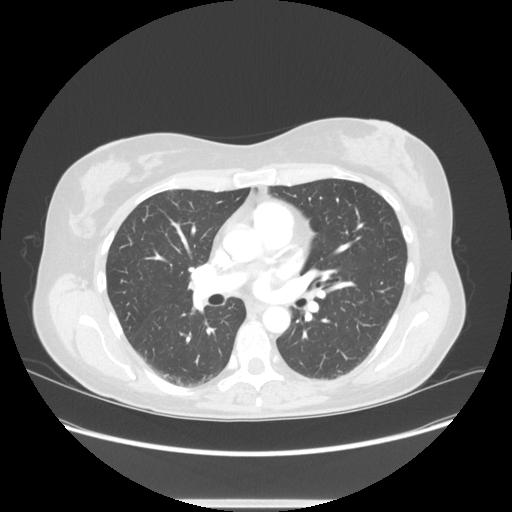

[Series 10: cap ax 2.5 a0 · axial · 0.78mm/px · z∈[-538,-410]mm · 3 of 464 slices shown]
[im 52/464  soft-tissue]
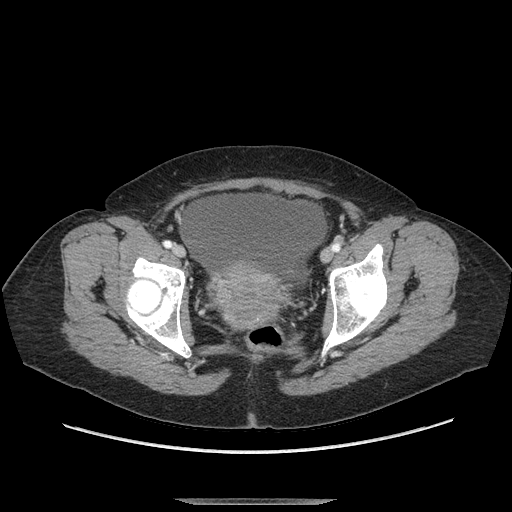
[im 103/464  soft-tissue]
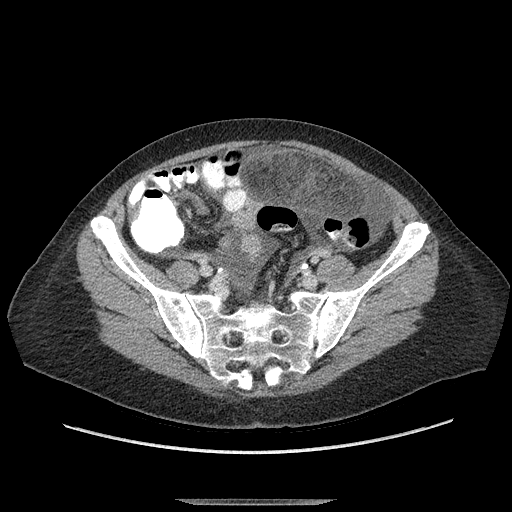
[im 155/464  soft-tissue]
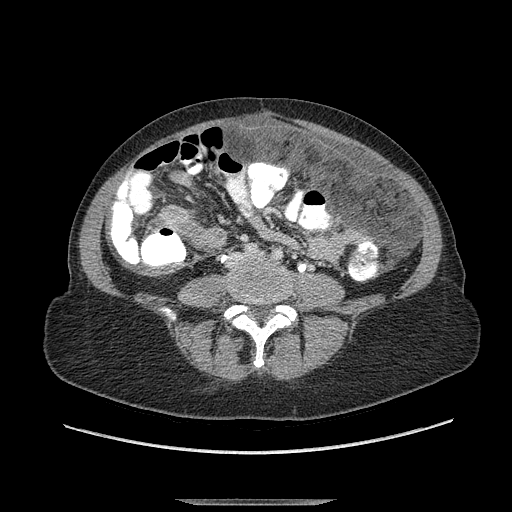

[Series 602: ch sag pv · sagittal · portal-venous · 0.78mm/px · 3 of 268 slices shown, 4 images]
[im 54/268  soft-tissue]
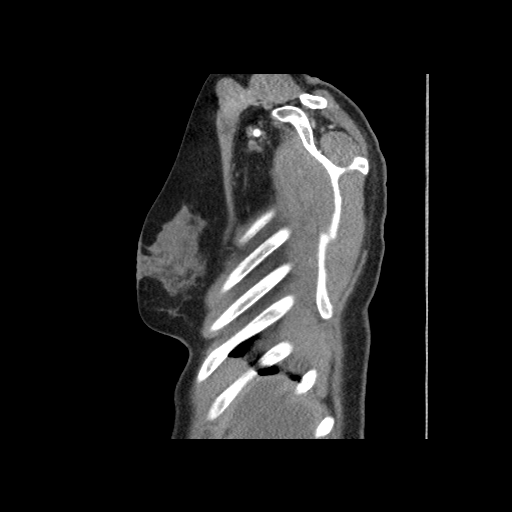
[im 107/268  soft-tissue]
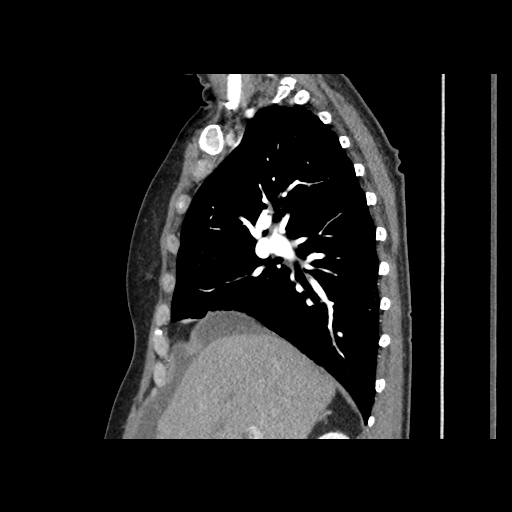
[im 107/268  bone]
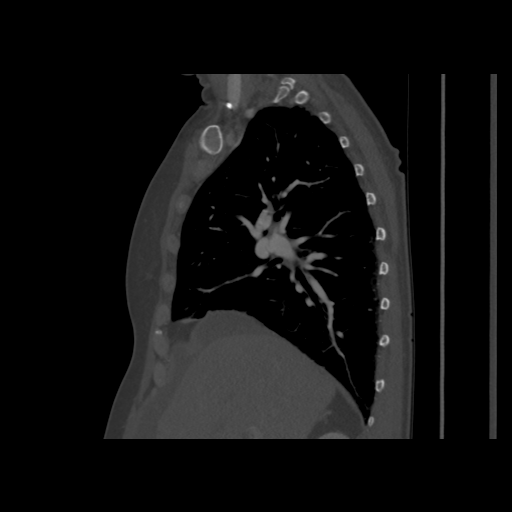
[im 161/268  soft-tissue]
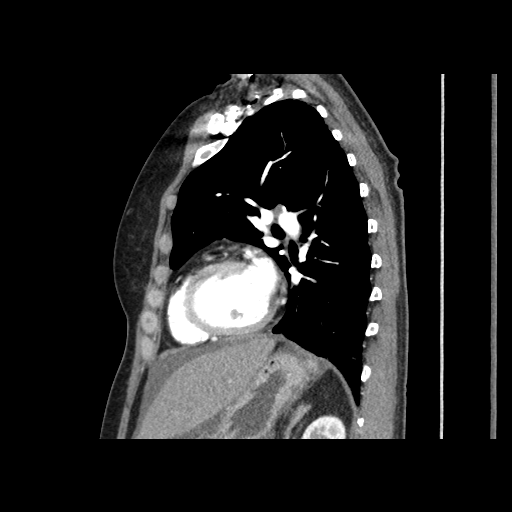

[14 of 46 positions shown; findings below may reference images not displayed]

Canned report from images found in remote index.

Refer to host system for actual result text.

## 2017-12-29 NOTE — Discharge Instructions (Signed)
General Anesthesia, Adult, Care After °These instructions provide you with information about caring for yourself after your procedure. Your health care provider may also give you more specific instructions. Your treatment has been planned according to current medical practices, but problems sometimes occur. Call your health care provider if you have any problems or questions after your procedure. °What can I expect after the procedure? °After the procedure, it is common to have: °· Vomiting. °· A sore throat. °· Mental slowness. ° °It is common to feel: °· Nauseous. °· Cold or shivery. °· Sleepy. °· Tired. °· Sore or achy, even in parts of your body where you did not have surgery. ° °Follow these instructions at home: °For at least 24 hours after the procedure: °· Do not: °? Participate in activities where you could fall or become injured. °? Drive. °? Use heavy machinery. °? Drink alcohol. °? Take sleeping pills or medicines that cause drowsiness. °? Make important decisions or sign legal documents. °? Take care of children on your own. °· Rest. °Eating and drinking °· If you vomit, drink water, juice, or soup when you can drink without vomiting. °· Drink enough fluid to keep your urine clear or pale yellow. °· Make sure you have little or no nausea before eating solid foods. °· Follow the diet recommended by your health care provider. °General instructions °· Have a responsible adult stay with you until you are awake and alert. °· Return to your normal activities as told by your health care provider. Ask your health care provider what activities are safe for you. °· Take over-the-counter and prescription medicines only as told by your health care provider. °· If you smoke, do not smoke without supervision. °· Keep all follow-up visits as told by your health care provider. This is important. °Contact a health care provider if: °· You continue to have nausea or vomiting at home, and medicines are not helpful. °· You  cannot drink fluids or start eating again. °· You cannot urinate after 8-12 hours. °· You develop a skin rash. °· You have fever. °· You have increasing redness at the site of your procedure. °Get help right away if: °· You have difficulty breathing. °· You have chest pain. °· You have unexpected bleeding. °· You feel that you are having a life-threatening or urgent problem. °This information is not intended to replace advice given to you by your health care provider. Make sure you discuss any questions you have with your health care provider. °Document Released: 09/26/2000 Document Revised: 11/23/2015 Document Reviewed: 06/04/2015 °Elsevier Interactive Patient Education © 2018 [employer] ° °

## 2018-01-01 ENCOUNTER — Ambulatory Visit
Admission: RE | Admit: 2018-01-01 | Discharge: 2018-01-01 | Disposition: A | Discharge status: Home / Self Care | Payer: BLUE CROSS/BLUE SHIELD | Source: Ambulatory Visit | Attending: Gastroenterology | Admitting: Gastroenterology

## 2018-01-01 ENCOUNTER — Encounter
Admission: RE | Disposition: A | Discharge status: Home / Self Care | Payer: Self-pay | Source: Ambulatory Visit | Attending: Gastroenterology

## 2018-01-01 ENCOUNTER — Ambulatory Visit: Payer: BLUE CROSS/BLUE SHIELD | Admitting: Anesthesiology

## 2018-01-01 DIAGNOSIS — L719 Rosacea, unspecified: Secondary | ICD-10-CM | POA: Insufficient documentation

## 2018-01-01 DIAGNOSIS — Z87891 Personal history of nicotine dependence: Secondary | ICD-10-CM | POA: Insufficient documentation

## 2018-01-01 DIAGNOSIS — R194 Change in bowel habit: Secondary | ICD-10-CM

## 2018-01-01 DIAGNOSIS — R6881 Early satiety: Secondary | ICD-10-CM

## 2018-01-01 DIAGNOSIS — R195 Other fecal abnormalities: Secondary | ICD-10-CM | POA: Diagnosis present

## 2018-01-01 DIAGNOSIS — K2970 Gastritis, unspecified, without bleeding: Secondary | ICD-10-CM | POA: Diagnosis not present

## 2018-01-01 DIAGNOSIS — E785 Hyperlipidemia, unspecified: Secondary | ICD-10-CM | POA: Insufficient documentation

## 2018-01-01 DIAGNOSIS — K921 Melena: Secondary | ICD-10-CM

## 2018-01-01 DIAGNOSIS — Z79899 Other long term (current) drug therapy: Secondary | ICD-10-CM | POA: Insufficient documentation

## 2018-01-01 DIAGNOSIS — K2900 Acute gastritis without bleeding: Secondary | ICD-10-CM | POA: Diagnosis not present

## 2018-01-01 DIAGNOSIS — K219 Gastro-esophageal reflux disease without esophagitis: Secondary | ICD-10-CM | POA: Insufficient documentation

## 2018-01-01 DIAGNOSIS — C180 Malignant neoplasm of cecum: Secondary | ICD-10-CM | POA: Insufficient documentation

## 2018-01-01 DIAGNOSIS — F419 Anxiety disorder, unspecified: Secondary | ICD-10-CM | POA: Insufficient documentation

## 2018-01-01 DIAGNOSIS — C20 Malignant neoplasm of rectum: Secondary | ICD-10-CM | POA: Insufficient documentation

## 2018-01-01 DIAGNOSIS — E2839 Other primary ovarian failure: Secondary | ICD-10-CM | POA: Diagnosis not present

## 2018-01-01 DIAGNOSIS — D490 Neoplasm of unspecified behavior of digestive system: Secondary | ICD-10-CM

## 2018-01-01 HISTORY — PX: ESOPHAGOGASTRODUODENOSCOPY (EGD) WITH PROPOFOL: SHX5813

## 2018-01-01 HISTORY — PX: COLONOSCOPY WITH PROPOFOL: SHX5780

## 2018-01-01 SURGERY — COLONOSCOPY WITH PROPOFOL
Anesthesia: Choice

## 2018-01-01 MED ORDER — SODIUM CHLORIDE 0.9 % IV SOLN: INTRAVENOUS | Status: DC

## 2018-01-01 MED ORDER — OXYCODONE HCL 5 MG PO TABS: 5.0000 mg | ORAL_TABLET | Freq: Once | ORAL | Status: DC | PRN

## 2018-01-01 MED ORDER — LIDOCAINE HCL (CARDIAC) PF 100 MG/5ML IV SOSY
PREFILLED_SYRINGE | INTRAVENOUS | Status: DC | PRN
  Administered 2018-01-01: 40 mg via INTRAVENOUS

## 2018-01-01 MED ORDER — STERILE WATER FOR IRRIGATION IR SOLN
Status: DC | PRN
  Administered 2018-01-01: 3 mL

## 2018-01-01 MED ORDER — DEXMEDETOMIDINE HCL 200 MCG/2ML IV SOLN
INTRAVENOUS | Status: DC | PRN
  Administered 2018-01-01: 4 ug via INTRAVENOUS

## 2018-01-01 MED ORDER — OXYCODONE HCL 5 MG/5ML PO SOLN: 5.0000 mg | Freq: Once | ORAL | Status: DC | PRN

## 2018-01-01 MED ORDER — PROPOFOL 10 MG/ML IV BOLUS
INTRAVENOUS | Status: DC | PRN
  Administered 2018-01-01: 20 mg via INTRAVENOUS
  Administered 2018-01-01: 100 mg via INTRAVENOUS
  Administered 2018-01-01 (×2): 20 mg via INTRAVENOUS
  Administered 2018-01-01 (×3): 30 mg via INTRAVENOUS
  Administered 2018-01-01 (×2): 20 mg via INTRAVENOUS
  Administered 2018-01-01 (×2): 30 mg via INTRAVENOUS

## 2018-01-01 MED ORDER — GLYCOPYRROLATE 0.2 MG/ML IJ SOLN
INTRAMUSCULAR | Status: DC | PRN
  Administered 2018-01-01: 0.1 mg via INTRAVENOUS

## 2018-01-01 MED ORDER — LACTATED RINGERS IV SOLN
INTRAVENOUS | Status: DC
  Administered 2018-01-01: 11:00:00 via INTRAVENOUS

## 2018-01-01 SURGICAL SUPPLY — 36 items
BALLN DILATOR 10-12 8 (BALLOONS)
BALLN DILATOR 12-15 8 (BALLOONS)
BALLN DILATOR 15-18 8 (BALLOONS)
BALLN DILATOR CRE 0-12 8 (BALLOONS)
BALLN DILATOR ESOPH 8 10 CRE (MISCELLANEOUS) IMPLANT
BALLOON DILATOR 12-15 8 (BALLOONS) IMPLANT
BALLOON DILATOR 15-18 8 (BALLOONS) IMPLANT
BALLOON DILATOR CRE 0-12 8 (BALLOONS) IMPLANT
BLOCK BITE 60FR ADLT L/F GRN (MISCELLANEOUS) ×2 IMPLANT
CANISTER SUCT 1200ML W/VALVE (MISCELLANEOUS) ×2 IMPLANT
CLIP HMST 235XBRD CATH ROT (MISCELLANEOUS) IMPLANT
CLIP RESOLUTION 360 11X235 (MISCELLANEOUS)
ELECT REM PT RETURN 9FT ADLT (ELECTROSURGICAL)
ELECTRODE REM PT RTRN 9FT ADLT (ELECTROSURGICAL) IMPLANT
FCP ESCP3.2XJMB 240X2.8X (MISCELLANEOUS)
FORCEPS BIOP RAD 4 LRG CAP 4 (CUTTING FORCEPS) ×2 IMPLANT
FORCEPS BIOP RJ4 240 W/NDL (MISCELLANEOUS)
FORCEPS ESCP3.2XJMB 240X2.8X (MISCELLANEOUS) IMPLANT
GOWN CVR UNV OPN BCK APRN NK (MISCELLANEOUS) ×2 IMPLANT
GOWN ISOL THUMB LOOP REG UNIV (MISCELLANEOUS) ×2
INJECTOR VARIJECT VIN23 (MISCELLANEOUS) IMPLANT
KIT DEFENDO VALVE AND CONN (KITS) IMPLANT
KIT ENDO PROCEDURE OLY (KITS) ×2 IMPLANT
MARKER SPOT ENDO TATTOO 5ML (MISCELLANEOUS) IMPLANT
PROBE APC STR FIRE (PROBE) IMPLANT
RETRIEVER NET PLAT FOOD (MISCELLANEOUS) IMPLANT
RETRIEVER NET ROTH 2.5X230 LF (MISCELLANEOUS) IMPLANT
SNARE SHORT THROW 13M SML OVAL (MISCELLANEOUS) IMPLANT
SNARE SHORT THROW 30M LRG OVAL (MISCELLANEOUS) IMPLANT
SNARE SNG USE RND 15MM (INSTRUMENTS) IMPLANT
SPOT EX ENDOSCOPIC TATTOO (MISCELLANEOUS)
SYR INFLATION 60ML (SYRINGE) IMPLANT
TRAP ETRAP POLY (MISCELLANEOUS) IMPLANT
VARIJECT INJECTOR VIN23 (MISCELLANEOUS)
WATER STERILE IRR 250ML POUR (IV SOLUTION) ×2 IMPLANT
WIRE CRE 18-20MM 8CM F G (MISCELLANEOUS) IMPLANT

## 2018-01-01 NOTE — H&P (Signed)
Lucilla Lame, MD [address], [place] [address] Phone:[phone] Fax : [phone]  Primary Care Physician:  Valerie Roys, DO Primary Gastroenterologist:  Dr. Allen Norris  Pre-Procedure History & Physical: HPI:  Valerie Horton is a 57 y.o. female is here for an endoscopy and colonoscopy.   Past Medical History:  Diagnosis Date  . Acid reflux   . Allergic rhinitis   . Anxiety   . Arthritis    left knee  . Cervical spondylosis   . CTS (carpal tunnel syndrome)    Right  . Diverticulosis   . HSV-2 (herpes simplex virus 2) infection   . Hyperlipidemia   . Ovarian failure   . Peritoneal carcinomatosis ([place]) 01/2016   chemo tx's.   . Rosacea   . Wears contact lenses     Past Surgical History:  Procedure Laterality Date  . ANTERIOR CRUCIATE LIGAMENT REPAIR Left   . CESAREAN SECTION    . COLONOSCOPY WITH PROPOFOL N/A 01/29/2016   Procedure: COLONOSCOPY WITH PROPOFOL;  Surgeon: Lucilla Lame, MD;  Location: [facility];  Service: Endoscopy;  Laterality: N/A;  . ESOPHAGOGASTRODUODENOSCOPY (EGD) WITH PROPOFOL N/A 01/29/2016   Procedure: ESOPHAGOGASTRODUODENOSCOPY (EGD) WITH PROPOFOL;  Surgeon: Lucilla Lame, MD;  Location: [facility];  Service: Endoscopy;  Laterality: N/A;  . PERIPHERAL VASCULAR CATHETERIZATION N/A 02/10/2016   Procedure: Glori Luis Cath Insertion;  Surgeon: Algernon Huxley, MD;  Location: [facility];  Service: Cardiovascular;  Laterality: N/A;    Prior to Admission medications   Medication Sig Start Date End Date Taking? Authorizing Provider  acyclovir (ZOVIRAX) 800 MG tablet TAKE 1 TABLET BY MOUTH EVERY DAY 03/22/17  Yes Johnson, Megan P, DO  Cholecalciferol 1000 units capsule Take 1,000 Units by mouth daily.    Yes [provider]  escitalopram (LEXAPRO) 10 MG tablet TAKE 1 TABLET BY MOUTH DAILY 09/06/17  Yes Johnson, Megan P, DO  fluticasone (FLONASE) 50 MCG/ACT nasal spray Place into both nostrils daily.   Yes  [provider]  meloxicam (MOBIC) 15 MG tablet Mobic 15 mg tablet  Take 1 tablet every day by oral route with meals.   Yes [provider]  methocarbamol (ROBAXIN) 500 MG tablet Take 500 mg by mouth 2 (two) times daily. 11/20/17  Yes [provider]  pantoprazole (PROTONIX) 40 MG tablet TAKE 1 TABLET BY MOUTH EVERY DAY 12/11/17  Yes Lloyd Huger, MD  prochlorperazine (COMPAZINE) 10 MG tablet Take 10 mg by mouth every 6 (six) hours as needed for nausea or vomiting.   Yes [provider]  gabapentin (NEURONTIN) 300 MG capsule Take 300 mg by mouth at bedtime.    [provider]  HYDROcodone-acetaminophen (NORCO/VICODIN) 5-325 MG tablet TAKE 1 TABLET BY MOUTH EVERY 4 TO 6 HOURS AS NEEDED FOR PAIN WITH FOOD 09/14/17   [provider]  lidocaine-prilocaine (EMLA) cream Apply 1 application topically as needed. Patient not taking: Reported on 10/31/2017 09/13/16   Lloyd Huger, MD  naproxen (NAPROSYN) 500 MG tablet Take 1 tablet (500 mg total) by mouth 2 (two) times daily with a meal. Patient not taking: Reported on 12/18/2017 06/29/17   Park Liter P, DO  ondansetron (ZOFRAN) 8 MG tablet Take by mouth every 8 (eight) hours as needed for nausea or vomiting.    [provider]  ondansetron (ZOFRAN-ODT) 8 MG disintegrating tablet DISSOLVE 1 TABLET ON TONGUE EVERY 6 TO 8 HOURS AS NEEDED FOR NAUSEA OR VOMITING 09/14/17  [provider]    Allergies as of 12/20/2017  . (No Known Allergies)    Family History  Adopted: Yes    Social History   Socioeconomic History  . Marital status: Married    Spouse name: Not on file  . Number of children: 3  . Years of education: Not on file  . Highest education level: Not on file  Occupational History  . Occupation: Agricultural engineer  Social Needs  . Financial resource strain: Not on file  . Food insecurity:    Worry: Not on file    Inability: Not on file  . Transportation needs:      Medical: Not on file    Non-medical: Not on file  Tobacco Use  . Smoking status: Former Research scientist (life sciences)  . Smokeless tobacco: Never Used  . Tobacco comment: Quit in her 59's  Substance and Sexual Activity  . Alcohol use: Yes    Alcohol/week: 0.0 oz    Comment: 1 drink/mo  . Drug use: No  . Sexual activity: Yes  Lifestyle  . Physical activity:    Days per week: Not on file    Minutes per session: Not on file  . Stress: Not on file  Relationships  . Social connections:    Talks on phone: Not on file    Gets together: Not on file    Attends religious service: Not on file    Active member of club or organization: Not on file    Attends meetings of clubs or organizations: Not on file    Relationship status: Not on file  . Intimate partner violence:    Fear of current or ex partner: Not on file    Emotionally abused: Not on file    Physically abused: Not on file    Forced sexual activity: Not on file  Other Topics Concern  . Not on file  Social History Narrative  . Not on file    Review of Systems: See HPI, otherwise negative ROS  Physical Exam: BP 114/69   Pulse 87   Temp 98.2 F (36.8 C)   Wt 155 lb (70.3 kg)   SpO2 100%   BMI 28.35 kg/m  General:   Alert,  pleasant and cooperative in NAD Head:  Normocephalic and atraumatic. Neck:  Supple; no masses or thyromegaly. Lungs:  Clear throughout to auscultation.    Heart:  Regular rate and rhythm. Abdomen:  Soft, nontender and nondistended. Normal bowel sounds, without guarding, and without rebound.   Neurologic:  Alert and  oriented x4;  grossly normal neurologically.  Impression/Plan: Valerie Horton is here for an endoscopy and colonoscopy to be performed for Early satiety and change in stool habits.  Risks, benefits, limitations, and alternatives regarding  endoscopy and colonoscopy have been reviewed with the patient.  Questions have been answered.  All parties agreeable.   Lucilla Lame, MD  01/01/2018, 10:28 AM

## 2018-01-01 NOTE — Op Note (Signed)
[facility] Patient Name: Valerie Horton Procedure Date: 01/01/2018 11:00 AM MRN: [id number] Account #: [id number] Date of Birth: 1960/07/11 Admit Type: Outpatient Age: 57 Room: [facility] OR ROOM 01 Gender: Female Note Status: Finalized Procedure:            Upper GI endoscopy Indications:          Early satiety Providers:            Lucilla Lame MD, MD Referring MD:         Valerie Roys (Referring MD) Medicines:            Monitored Anesthesia Care, Propofol per Anesthesia Complications:        No immediate complications. Procedure:            Pre-Anesthesia Assessment:                       - Prior to the procedure, a History and Physical was                        performed, and patient medications and allergies were                        reviewed. The patient's tolerance of previous                        anesthesia was also reviewed. The risks and benefits of                        the procedure and the sedation options and risks were                        discussed with the patient. All questions were                        answered, and informed consent was obtained. Prior                        Anticoagulants: The patient has taken no previous                        anticoagulant or antiplatelet agents. ASA Grade                        Assessment: II - A patient with mild systemic disease.                        After reviewing the risks and benefits, the patient was                        deemed in satisfactory condition to undergo the                        procedure.                       After obtaining informed consent, the endoscope was                        passed under direct vision. Throughout the procedure,  the patient's blood pressure, pulse, and oxygen                        saturations were monitored continuously. The was                        introduced through the mouth, and advanced to the                second part of duodenum. The upper GI endoscopy was                        accomplished without difficulty. The patient tolerated                        the procedure well. Findings:      The examined esophagus was normal.      Localized mild inflammation characterized by erythema was found in the       gastric antrum. Biopsies were taken with a cold forceps for histology.      The examined duodenum was normal. Impression:           - Normal esophagus.                       - Gastritis. Biopsied.                       - Normal examined duodenum. Recommendation:       - Discharge patient to home.                       - Resume previous diet.                       - Continue present medications.                       - Await pathology results.                       - Perform a colonoscopy today. Procedure Code(s):    --- Professional ---                       [id number], Esophagogastroduodenoscopy, flexible, transoral;                        with biopsy, single or multiple Diagnosis Code(s):    --- Professional ---                       R68.81, Early satiety                       K29.70, Gastritis, unspecified, without bleeding CPT copyright 2017 American Medical Association. All rights reserved. The codes documented in this report are preliminary and upon coder review may  be revised to meet current compliance requirements. Lucilla Lame MD, MD 01/01/2018 11:11:01 AM This report has been signed electronically. Number of Addenda: 0 Note Initiated On: 01/01/2018 11:00 AM Total Procedure Duration: 0 hours 2 minutes 58 seconds       [facility]

## 2018-01-01 NOTE — Transfer of Care (Signed)
Immediate Anesthesia Transfer of Care Note  Patient: Valerie Horton  Procedure(s) Performed: COLONOSCOPY WITH PROPOFOL (N/A ) ESOPHAGOGASTRODUODENOSCOPY (EGD) WITH PROPOFOL (N/A )  Patient Location: PACU  Anesthesia Type: No value filed.  Level of Consciousness: awake, alert  and patient cooperative  Airway and Oxygen Therapy: Patient Spontanous Breathing and Patient connected to supplemental oxygen  Post-op Assessment: Post-op Vital signs reviewed, Patient's Cardiovascular Status Stable, Respiratory Function Stable, Patent Airway and No signs of Nausea or vomiting  Post-op Vital Signs: Reviewed and stable  Complications: No apparent anesthesia complications

## 2018-01-01 NOTE — Anesthesia Preprocedure Evaluation (Signed)
Anesthesia Evaluation  Patient identified by MRN, date of birth, ID band Patient awake    Reviewed: Allergy & Precautions, H&P , NPO status , Patient's Chart, lab work & pertinent test results  Airway Mallampati: II  TM Distance: >3 FB Neck ROM: full    Dental no notable dental hx.    Pulmonary former smoker,    Pulmonary exam normal        Cardiovascular negative cardio ROS Normal cardiovascular exam     Neuro/Psych    GI/Hepatic Neg liver ROS, Medicated,  Endo/Other  negative endocrine ROS  Renal/GU negative Renal ROS     Musculoskeletal   Abdominal   Peds  Hematology negative hematology ROS (+)   Anesthesia Other Findings   Reproductive/Obstetrics negative OB ROS                             Anesthesia Physical Anesthesia Plan  ASA: II  Anesthesia Plan: General   Post-op Pain Management:    Induction:   PONV Risk Score and Plan:   Airway Management Planned:   Additional Equipment:   Intra-op Plan:   Post-operative Plan:   Informed Consent: I have reviewed the patients History and Physical, chart, labs and discussed the procedure including the risks, benefits and alternatives for the proposed anesthesia with the patient or authorized representative who has indicated his/her understanding and acceptance.     Plan Discussed with:   Anesthesia Plan Comments:         Anesthesia Quick Evaluation

## 2018-01-01 NOTE — Anesthesia Procedure Notes (Signed)
Procedure Name: MAC Date/Time: 01/01/2018 11:00 AM Performed by: Janna Arch, CRNA Pre-anesthesia Checklist: Patient identified, Emergency Drugs available, Suction available and Patient being monitored Patient Re-evaluated:Patient Re-evaluated prior to induction Oxygen Delivery Method: Nasal cannula

## 2018-01-01 NOTE — Anesthesia Postprocedure Evaluation (Signed)
Anesthesia Post Note  Patient: Valerie Horton  Procedure(s) Performed: COLONOSCOPY WITH PROPOFOL (N/A ) ESOPHAGOGASTRODUODENOSCOPY (EGD) WITH PROPOFOL (N/A )  Patient location during evaluation: PACU Anesthesia Type: General Level of consciousness: awake and alert Pain management: pain level controlled Vital Signs Assessment: post-procedure vital signs reviewed and stable Respiratory status: spontaneous breathing Cardiovascular status: blood pressure returned to baseline Postop Assessment: no headache Anesthetic complications: no    Jaci Standard, III,  Shardea Cwynar D

## 2018-01-01 NOTE — Op Note (Signed)
[facility] Patient Name: Valerie Horton Procedure Date: 01/01/2018 10:59 AM MRN: [id number] Account #: [id number] Date of Birth: 1961-02-01 Admit Type: Outpatient Age: 57 Room: [facility] OR ROOM 01 Gender: Female Note Status: Finalized Procedure:            Colonoscopy Indications:          Change in stool caliber Providers:            Lucilla Lame MD, MD Referring MD:         Valerie Roys (Referring MD) Medicines:            Propofol per Anesthesia Complications:        No immediate complications. Procedure:            Pre-Anesthesia Assessment:                       - Prior to the procedure, a History and Physical was                        performed, and patient medications and allergies were                        reviewed. The patient's tolerance of previous                        anesthesia was also reviewed. The risks and benefits of                        the procedure and the sedation options and risks were                        discussed with the patient. All questions were                        answered, and informed consent was obtained. Prior                        Anticoagulants: The patient has taken no previous                        anticoagulant or antiplatelet agents. ASA Grade                        Assessment: II - A patient with mild systemic disease.                        After reviewing the risks and benefits, the patient was                        deemed in satisfactory condition to undergo the                        procedure.                       After obtaining informed consent, the colonoscope was                        passed under direct vision. Throughout the procedure,  the patient's blood pressure, pulse, and oxygen                        saturations were monitored continuously. The                        Colonoscope was introduced through the anus and                        advanced to  the the terminal ileum. The colonoscopy was                        performed without difficulty. The patient tolerated the                        procedure well. The quality of the bowel preparation                        was excellent. Findings:      The perianal and digital rectal examinations were normal.      A non-obstructing large mass was found in the cecum. The mass was       non-circumferential. Oozing was present. This was biopsied with a cold       forceps for histology.      The terminal ileum appeared normal.      A sessile non-obstructing large mass was found in the rectum. The mass       was non-circumferential. The mass measured five cm in length. This was       biopsied with a cold forceps for histology. Impression:           - Likely malignant tumor in the cecum. Biopsied.                       - The examined portion of the ileum was normal.                       - Likely malignant tumor in the rectum. Biopsied. Recommendation:       - Discharge patient to home.                       - Resume previous diet.                       - Continue present medications.                       - Await pathology results. Procedure Code(s):    --- Professional ---                       [phone], Colonoscopy, flexible; with biopsy, single or                        multiple Diagnosis Code(s):    --- Professional ---                       R19.5, Other fecal abnormalities                       D49.0, Neoplasm of unspecified behavior of digestive  system CPT copyright 2017 American Medical Association. All rights reserved. The codes documented in this report are preliminary and upon coder review may  be revised to meet current compliance requirements. Lucilla Lame MD, MD 01/01/2018 11:27:58 AM This report has been signed electronically. Number of Addenda: 0 Note Initiated On: 01/01/2018 10:59 AM Scope Withdrawal Time: 0 hours 8 minutes 37 seconds  Total Procedure  Duration: 0 hours 11 minutes 7 seconds       [facility]

## 2018-01-02 ENCOUNTER — Other Ambulatory Visit: Payer: Self-pay | Admitting: Oncology

## 2018-01-02 ENCOUNTER — Encounter: Payer: Self-pay | Admitting: Oncology

## 2018-01-02 ENCOUNTER — Encounter: Payer: Self-pay | Admitting: Gastroenterology

## 2018-01-02 DIAGNOSIS — C801 Malignant (primary) neoplasm, unspecified: Secondary | ICD-10-CM

## 2018-01-02 DIAGNOSIS — Z7189 Other specified counseling: Secondary | ICD-10-CM

## 2018-01-02 DIAGNOSIS — C786 Secondary malignant neoplasm of retroperitoneum and peritoneum: Secondary | ICD-10-CM

## 2018-01-03 LAB — SURGICAL PATHOLOGY

## 2018-01-07 NOTE — Progress Notes (Signed)
[place]  [phone]) [phone] [phone]) [phone]  ID: ADELAYDE Horton OB: December 21, 1960  MR#: [id number]  WPY#:[id number]  Patient Care Team: Valerie Roys, DO as PCP - General (Family Medicine) Clent Jacks, RN as Registered Nurse Garnetta Buddy, MD as Consulting Physician (Internal Medicine)  CHIEF COMPLAINT: Peritoneal carcinomatosis, metastatic adenocarcinoma of likely lower GI primary.  INTERVAL HISTORY: Patient returns to clinic today for further evaluation, discussion of her colonoscopy and imaging results, and treatment planning.  She is anxious, but otherwise feels well.  She does not have any abdominal distention or bloating today.  She has no neurological complaints. She denies any recent fevers or illnesses.  She has no chest pain or shortness of breath.  She denies any nausea, vomiting, constipation, or diarrhea.  She has no melena or hematochezia.  She has no urinary complaints.  Patient offers no further specific complaints today.    REVIEW OF SYSTEMS:   Review of Systems  Constitutional: Negative.  Negative for fever, malaise/fatigue and weight loss.  Respiratory: Negative.  Negative for cough and shortness of breath.   Cardiovascular: Negative.  Negative for chest pain and leg swelling.  Gastrointestinal: Negative.  Negative for abdominal pain, blood in stool, constipation, diarrhea, melena and vomiting.  Genitourinary: Negative.   Musculoskeletal: Negative.   Skin: Negative.  Negative for rash.  Neurological: Negative.  Negative for sensory change, focal weakness and weakness.  Psychiatric/Behavioral: Negative.  The patient is not nervous/anxious.     As per HPI. Otherwise, a complete review of systems is negative.  PAST MEDICAL HISTORY: Past Medical History:  Diagnosis Date  . Acid reflux   . Allergic rhinitis   . Anxiety   . Arthritis    left knee  . Cervical spondylosis   . CTS (carpal tunnel syndrome)    Right  .  Diverticulosis   . HSV-2 (herpes simplex virus 2) infection   . Hyperlipidemia   . Ovarian failure   . Peritoneal carcinomatosis ([place]) 01/2016   chemo tx's.   . Rosacea   . Wears contact lenses     PAST SURGICAL HISTORY: Past Surgical History:  Procedure Laterality Date  . ANTERIOR CRUCIATE LIGAMENT REPAIR Left   . CESAREAN SECTION    . COLONOSCOPY WITH PROPOFOL N/A 01/29/2016   Procedure: COLONOSCOPY WITH PROPOFOL;  Surgeon: Lucilla Lame, MD;  Location: [place] [facility];  Service: Endoscopy;  Laterality: N/A;  . COLONOSCOPY WITH PROPOFOL N/A 01/01/2018   Procedure: COLONOSCOPY WITH PROPOFOL;  Surgeon: Lucilla Lame, MD;  Location: [place] [facility];  Service: Endoscopy;  Laterality: N/A;  . ESOPHAGOGASTRODUODENOSCOPY (EGD) WITH PROPOFOL N/A 01/29/2016   Procedure: ESOPHAGOGASTRODUODENOSCOPY (EGD) WITH PROPOFOL;  Surgeon: Lucilla Lame, MD;  Location: [place];  Service: Endoscopy;  Laterality: N/A;  . ESOPHAGOGASTRODUODENOSCOPY (EGD) WITH PROPOFOL N/A 01/01/2018   Procedure: ESOPHAGOGASTRODUODENOSCOPY (EGD) WITH PROPOFOL;  Surgeon: Lucilla Lame, MD;  Location: [place];  Service: Endoscopy;  Laterality: N/A;  . PERIPHERAL VASCULAR CATHETERIZATION N/A 02/10/2016   Procedure: Glori Luis Cath Insertion;  Surgeon: Algernon Huxley, MD;  Location: [facility];  Service: Cardiovascular;  Laterality: N/A;    FAMILY HISTORY: Unknown.  Patient is adopted.     ADVANCED DIRECTIVES:    HEALTH MAINTENANCE: Social History   Tobacco Use  . Smoking status: Former Research scientist (life sciences)  . Smokeless tobacco: Never Used  . Tobacco comment: Quit in her 57's  Substance Use Topics  . Alcohol use: Yes    Alcohol/week: 0.0 oz  Comment: 1 drink/mo  . Drug use: No     Colonoscopy:  PAP:  Bone density:  Lipid panel:  No Known Allergies  Current Outpatient Medications  Medication Sig Dispense Refill  . acyclovir (ZOVIRAX) 800 MG tablet TAKE 1 TABLET BY MOUTH EVERY DAY 90 tablet 1  .  Cholecalciferol 1000 units capsule Take 1,000 Units by mouth daily.     Marland Kitchen escitalopram (LEXAPRO) 10 MG tablet TAKE 1 TABLET BY MOUTH DAILY 90 tablet 1  . meloxicam (MOBIC) 15 MG tablet Mobic 15 mg tablet  Take 1 tablet every day by oral route with meals.    . methocarbamol (ROBAXIN) 500 MG tablet Take 500 mg by mouth 2 (two) times daily.  0  . pantoprazole (PROTONIX) 40 MG tablet TAKE 1 TABLET BY MOUTH EVERY DAY 30 tablet 5  . fluticasone (FLONASE) 50 MCG/ACT nasal spray Place into both nostrils daily.    Marland Kitchen gabapentin (NEURONTIN) 300 MG capsule Take 300 mg by mouth at bedtime.    Marland Kitchen HYDROcodone-acetaminophen (NORCO/VICODIN) 5-325 MG tablet TAKE 1 TABLET BY MOUTH EVERY 4 TO 6 HOURS AS NEEDED FOR PAIN WITH FOOD  0  . lidocaine-prilocaine (EMLA) cream Apply 1 application topically as needed. (Patient not taking: Reported on 59/30/2019) 30 g 3  . naproxen (NAPROSYN) 500 MG tablet Take 1 tablet (500 mg total) by mouth 2 (two) times daily with a meal. (Patient not taking: Reported on 59/17/2019) 90 tablet 1  . ondansetron (ZOFRAN) 8 MG tablet Take by mouth every 8 (eight) hours as needed for nausea or vomiting.    . ondansetron (ZOFRAN-ODT) 8 MG disintegrating tablet DISSOLVE 1 TABLET ON TONGUE EVERY 6 TO 8 HOURS AS NEEDED FOR NAUSEA OR VOMITING  0  . prochlorperazine (COMPAZINE) 10 MG tablet Take 10 mg by mouth every 6 (six) hours as needed for nausea or vomiting.     No current facility-administered medications for this visit.    Facility-Administered Medications Ordered in Other Visits  Medication Dose Route Frequency Provider Last Rate Last Dose  . heparin lock flush 100 unit/mL  500 Units Intravenous Once Lloyd Huger, MD      . sodium chloride flush (NS) 0.9 % injection 10 mL  10 mL Intravenous PRN Lloyd Huger, MD   10 mL at 59/13/18 0925  . sodium chloride flush (NS) 0.9 % injection 10 mL  10 mL Intracatheter PRN Lloyd Huger, MD   10 mL at 59/26/18 1343     OBJECTIVE: Vitals:   59/10/19 1348  BP: 113/74  Pulse: 87  Resp: 18  Temp: (!) 97.5 F (36.4 C)     Body mass index is 29.18 kg/m.    ECOG FS:0 - Asymptomatic  General: Well-developed, well-nourished, no acute distress. Eyes: Pink conjunctiva, anicteric sclera. Lungs: Clear to auscultation bilaterally. Heart: Regular rate and rhythm. No rubs, murmurs, or gallops. Abdomen: Soft, nontender, mild distention. Musculoskeletal: No edema, cyanosis, or clubbing. Neuro: Alert, answering all questions appropriately. Cranial nerves grossly intact. Skin: No rashes or petechiae noted. Psych: Normal affect.  LAB RESULTS:  Lab Results  Component Value Date   NA 134 (L) 10/31/2017   K 3.7 10/31/2017   CL 101 10/31/2017   CO2 23 10/31/2017   GLUCOSE 129 (H) 10/31/2017   BUN 18 10/31/2017   CREATININE 0.70 01/10/2018   CALCIUM 9.0 10/31/2017   PROT 7.6 10/31/2017   ALBUMIN 3.6 10/31/2017   AST 23 10/31/2017   ALT 15 10/31/2017   ALKPHOS 126 10/31/2017  BILITOT 0.6 10/31/2017   GFRNONAA >60 10/31/2017   GFRAA >60 10/31/2017    Lab Results  Component Value Date   WBC 7.2 10/31/2017   NEUTROABS 4.4 10/31/2017   HGB 11.6 (L) 10/31/2017   HCT 34.2 (L) 10/31/2017   MCV 84.0 10/31/2017   PLT 297 10/31/2017     STUDIES: Ct Abdomen Pelvis W Contrast  Result Date: 01/10/2018 CLINICAL DATA:  Peritoneal carcinomatosis EXAM: CT ABDOMEN AND PELVIS WITH CONTRAST TECHNIQUE: Multidetector CT imaging of the abdomen and pelvis was performed using the standard protocol following bolus administration of intravenous contrast. CONTRAST:  177mL ISOVUE-300 IOPAMIDOL (ISOVUE-300) INJECTION 61% COMPARISON:  10/26/2017 FINDINGS: Lower chest: Mild dependent atelectasis in the right lower lobe. Hepatobiliary: Liver is grossly unremarkable. Mild scalloping overlying the right hepatic lobe (series 2/image 28), related to known peritoneal disease. Gallbladder is unremarkable. No intrahepatic or  extrahepatic ductal dilatation. Pancreas: Within normal limits. Spleen: Within normal limits. Adrenals/Urinary Tract: Adrenal glands are within normal limits. Kidneys are within normal limits.  No hydronephrosis. Bladder is underdistended but unremarkable. Stomach/Bowel: Stomach is within normal limits. Loculated fluid/ascites inferior to the gastric antrum, now measuring 5.1 x 7.2 cm (series 2/image 34), previously 4.3 x 6.1 cm. No evidence of bowel obstruction. Soft tissue mass involving the base of the cecum (series 2/image 70) as well as the distal ileum (series 2/image 68). Mass measures approximately 4.6 x 2.8 x 2.9 cm in aggregate (series 2/image 69). Relative sparing of the terminal ileum at the ileocecal valve. Mild wall thickening involving the rectum anteriorly (series 2/image 80), possibly reflecting an overlying serosal implant. Vascular/Lymphatic: No evidence of abdominal aortic aneurysm. No suspicious abdominopelvic lymphadenopathy. Reproductive: Uterus and bilateral ovaries are poorly evaluated but grossly unremarkable. Other: Moderate abdominopelvic ascites, partially loculated, with omental caking/peritoneal disease. In addition to the loculated fluid inferior to the stomach, there is stable omental caking beneath the left mid abdominal wall (series 2/image 36) and scattered areas of loculated fluid versus soft tissue, for example a suspected 3.1 x 2.7 cm implant in the right mid abdomen (series 2/image 47), previously 3.8 x 2.4 cm. Overall, this appearance is considered unchanged. Musculoskeletal: Mild degenerative changes of the visualized thoracolumbar spine. IMPRESSION: Moderate abdominopelvic ascites with omental caking/peritoneal disease, overall grossly unchanged. Suspected 4.6 cm soft tissue mass involving the base of the cecum as well as the distal ileum, as described above. Possible serosal implant along the anterior aspect of the rectum, equivocal. Electronically Signed   By: Julian Hy M.D.   On: 01/10/2018 11:08   US Abdomen Limited  Result Date: 12/18/2017 CLINICAL DATA:  57 year old female with history of peritoneal carcinomatosis and complains of abdominal distension. Evaluate for ascites and possibly paracentesis. EXAM: LIMITED ABDOMEN ULTRASOUND FOR ASCITES TECHNIQUE: Limited ultrasound survey for ascites was performed in all four abdominal quadrants. COMPARISON:  CT 10/26/2017 FINDINGS: Small to moderate amount of fluid throughout the abdomen. There appears to be some areas of peritoneal thickening and compatible with patient's known disease. Large amount of bowel floating throughout the ascites. IMPRESSION: Small to moderate amount of ascites throughout the abdomen. Fluid appears to be relatively simple. Estimated ascites volume is roughly 1 liter. Ultrasound-guided paracentesis is technically feasible but patient declined the paracentesis based on the ascites volume. Electronically Signed   By: Markus Daft M.D.   On: 12/18/2017 12:34    ASSESSMENT: Peritoneal carcinomatosis, metastatic adenocarcinoma of likely lower GI primary.  PLAN:    1. Peritoneal carcinomatosis: Colonoscopy results reviewed and discussed with  GI physician noting 2 distinct masses in patient's rectum and cecum.  CT scan results from January 10, 2018 reviewed independently and reported as above with no new areas of metastatic disease.  These are consistent with her previous pathology results where immunohistochemistry suggested lower GI primary.  Patient received her last treatment of FOLFIRI plus Avastin was on April 06, 2017.  Intraperitoneal chemotherapy would not be beneficial, but with such isolated occurrence patient was referred to surgery for reevaluation.  Foundation 1 testing did not reveal any actionable mutations.  Regardless of her surgical status, patient will require chemotherapy.  Patient will return to clinic on January 23, 2018 to reinitiate cycle 1 of FOLFIRI.  2: Genetic testing:  Negative. 3. History of neutropenia: Patient will require Neulasta with treatments. 4. Peripheral neuropathy: Resolved. 5. Ascites: Patient had a paracentesis on October 11, 2017 and removed 1.2 L of fluid.  Prior to that she had 700 mL removed on December 23, 2016 and 3.5 L on September 16, 2016.  I spent a total of 30 minutes face-to-face with the patient of which greater than 50% of the visit was spent in counseling and coordination of care as detailed above.    Patient expressed understanding and was in agreement with this plan. She also understands that She can call clinic at any time with any questions, concerns, or complaints.     Lloyd Huger, MD 01/14/18 6:35 AM

## 2018-01-10 ENCOUNTER — Encounter: Payer: Self-pay | Admitting: Oncology

## 2018-01-10 ENCOUNTER — Inpatient Hospital Stay: Payer: BLUE CROSS/BLUE SHIELD | Attending: Oncology | Admitting: Oncology

## 2018-01-10 ENCOUNTER — Ambulatory Visit
Admission: RE | Admit: 2018-01-10 | Discharge: 2018-01-10 | Disposition: A | Discharge status: Home / Self Care | Payer: BLUE CROSS/BLUE SHIELD | Source: Ambulatory Visit | Attending: Oncology | Admitting: Oncology

## 2018-01-10 VITALS — BP 113/74 | HR 87 | Temp 97.5°F | Resp 18 | Wt 159.6 lb

## 2018-01-10 DIAGNOSIS — Z5111 Encounter for antineoplastic chemotherapy: Secondary | ICD-10-CM | POA: Insufficient documentation

## 2018-01-10 DIAGNOSIS — E785 Hyperlipidemia, unspecified: Secondary | ICD-10-CM

## 2018-01-10 DIAGNOSIS — C786 Secondary malignant neoplasm of retroperitoneum and peritoneum: Secondary | ICD-10-CM

## 2018-01-10 DIAGNOSIS — Z7689 Persons encountering health services in other specified circumstances: Secondary | ICD-10-CM | POA: Diagnosis not present

## 2018-01-10 DIAGNOSIS — K921 Melena: Secondary | ICD-10-CM | POA: Diagnosis not present

## 2018-01-10 DIAGNOSIS — Z87891 Personal history of nicotine dependence: Secondary | ICD-10-CM | POA: Insufficient documentation

## 2018-01-10 DIAGNOSIS — Z09 Encounter for follow-up examination after completed treatment for conditions other than malignant neoplasm: Secondary | ICD-10-CM | POA: Diagnosis not present

## 2018-01-10 DIAGNOSIS — C801 Malignant (primary) neoplasm, unspecified: Secondary | ICD-10-CM | POA: Diagnosis present

## 2018-01-10 DIAGNOSIS — R5383 Other fatigue: Secondary | ICD-10-CM

## 2018-01-10 DIAGNOSIS — K219 Gastro-esophageal reflux disease without esophagitis: Secondary | ICD-10-CM

## 2018-01-10 DIAGNOSIS — F419 Anxiety disorder, unspecified: Secondary | ICD-10-CM

## 2018-01-10 DIAGNOSIS — M1990 Unspecified osteoarthritis, unspecified site: Secondary | ICD-10-CM | POA: Insufficient documentation

## 2018-01-10 DIAGNOSIS — R634 Abnormal weight loss: Secondary | ICD-10-CM

## 2018-01-10 DIAGNOSIS — Z79899 Other long term (current) drug therapy: Secondary | ICD-10-CM | POA: Diagnosis not present

## 2018-01-10 DIAGNOSIS — R188 Other ascites: Secondary | ICD-10-CM | POA: Diagnosis not present

## 2018-01-10 DIAGNOSIS — R5381 Other malaise: Secondary | ICD-10-CM

## 2018-01-10 LAB — POCT I-STAT CREATININE: Creatinine, Ser: 0.7 mg/dL (ref 0.44–1.00)

## 2018-01-10 MED ORDER — IOPAMIDOL (ISOVUE-300) INJECTION 61%
100.0000 mL | Freq: Once | INTRAVENOUS | Status: AC | PRN
  Administered 2018-01-10: 100 mL via INTRAVENOUS

## 2018-01-10 NOTE — Progress Notes (Signed)
Spoke with Valerie Horton. I will contact Dr. Fanny Skates and have follow up arranged as well as see a Colorectal surgeon same day. Oncology Nurse Navigator Documentation  Navigator Location: [facility] (01/10/18 1400)   )Navigator Encounter Type: Follow-up Appt (01/10/18 1400)                                                    Time Spent with Patient: 15 (01/10/18 1400)

## 2018-01-10 NOTE — Progress Notes (Signed)
Pt in for follow up and scan results.  Husband is with patient today.

## 2018-01-16 ENCOUNTER — Ambulatory Visit: Payer: BLUE CROSS/BLUE SHIELD

## 2018-01-16 ENCOUNTER — Encounter: Payer: Self-pay | Admitting: Oncology

## 2018-01-16 ENCOUNTER — Other Ambulatory Visit: Payer: BLUE CROSS/BLUE SHIELD

## 2018-01-16 ENCOUNTER — Ambulatory Visit: Payer: BLUE CROSS/BLUE SHIELD | Admitting: Oncology

## 2018-01-18 ENCOUNTER — Encounter: Payer: Self-pay | Admitting: Oncology

## 2018-01-22 NOTE — Progress Notes (Signed)
[place]  [phone]) [phone] Fax:(336) [phone]  ID: Valerie Horton OB: Aug 25, 1960  MR#: [id number]  DGL#:[id number]  Patient Care Team: Valerie Roys, DO as PCP - General (Family Medicine) Clent Jacks, RN as Registered Nurse Garnetta Buddy, MD as Consulting Physician (Internal Medicine)  CHIEF COMPLAINT: Peritoneal carcinomatosis, metastatic adenocarcinoma of likely lower GI primary.  INTERVAL HISTORY: Patient returns to clinic today for further evaluation and reinitiation of FOLFIRI treatment for her progressive malignancy.  She continues to be anxious.  She continues to note blood in her stool.  The consistency of her stool was changed and she now feels like she has to use the bathroom multiple times a day, but denies constipation or diarrhea. She does not have any abdominal distention or bloating today.  She has no neurological complaints. She denies any recent fevers or illnesses.  She has no chest pain or shortness of breath.  She has no urinary complaints.  Patient offers no further specific complaints today.  REVIEW OF SYSTEMS:   Review of Systems  Constitutional: Negative.  Negative for fever, malaise/fatigue and weight loss.  Respiratory: Negative.  Negative for cough and shortness of breath.   Cardiovascular: Negative.  Negative for chest pain and leg swelling.  Gastrointestinal: Positive for blood in stool. Negative for abdominal pain, constipation, diarrhea, melena and vomiting.  Genitourinary: Negative.  Negative for dysuria.  Musculoskeletal: Negative.  Negative for back pain.  Skin: Negative.  Negative for rash.  Neurological: Negative.  Negative for sensory change, focal weakness, weakness and headaches.  Psychiatric/Behavioral: The patient is nervous/anxious.     As per HPI. Otherwise, a complete review of systems is negative.  PAST MEDICAL HISTORY: Past Medical History:  Diagnosis Date  . Acid reflux   . Allergic rhinitis   .  Anxiety   . Arthritis    left knee  . Cervical spondylosis   . CTS (carpal tunnel syndrome)    Right  . Diverticulosis   . HSV-2 (herpes simplex virus 2) infection   . Hyperlipidemia   . Ovarian failure   . Peritoneal carcinomatosis ([place]) 01/2016   chemo tx's.   . Rosacea   . Wears contact lenses     PAST SURGICAL HISTORY: Past Surgical History:  Procedure Laterality Date  . ANTERIOR CRUCIATE LIGAMENT REPAIR Left   . CESAREAN SECTION    . COLONOSCOPY WITH PROPOFOL N/A 01/29/2016   Procedure: COLONOSCOPY WITH PROPOFOL;  Surgeon: Lucilla Lame, MD;  Location: [facility];  Service: Endoscopy;  Laterality: N/A;  . COLONOSCOPY WITH PROPOFOL N/A 01/01/2018   Procedure: COLONOSCOPY WITH PROPOFOL;  Surgeon: Lucilla Lame, MD;  Location: [facility];  Service: Endoscopy;  Laterality: N/A;  . ESOPHAGOGASTRODUODENOSCOPY (EGD) WITH PROPOFOL N/A 01/29/2016   Procedure: ESOPHAGOGASTRODUODENOSCOPY (EGD) WITH PROPOFOL;  Surgeon: Lucilla Lame, MD;  Location: [facility];  Service: Endoscopy;  Laterality: N/A;  . ESOPHAGOGASTRODUODENOSCOPY (EGD) WITH PROPOFOL N/A 01/01/2018   Procedure: ESOPHAGOGASTRODUODENOSCOPY (EGD) WITH PROPOFOL;  Surgeon: Lucilla Lame, MD;  Location: [facility];  Service: Endoscopy;  Laterality: N/A;  . PERIPHERAL VASCULAR CATHETERIZATION N/A 02/10/2016   Procedure: Glori Luis Cath Insertion;  Surgeon: Algernon Huxley, MD;  Location: [facility];  Service: Cardiovascular;  Laterality: N/A;    FAMILY HISTORY: Unknown.  Patient is adopted.     ADVANCED DIRECTIVES:    HEALTH MAINTENANCE: Social History   Tobacco Use  . Smoking status: Former Research scientist (life sciences)  . Smokeless tobacco: Never Used  . Tobacco comment:  Quit in her 20's  Substance Use Topics  . Alcohol use: Yes    Alcohol/week: 0.0 oz    Comment: 1 drink/mo  . Drug use: No     Colonoscopy:  PAP:  Bone density:  Lipid panel:  No Known Allergies  Current Outpatient Medications  Medication  Sig Dispense Refill  . acyclovir (ZOVIRAX) 800 MG tablet TAKE 1 TABLET BY MOUTH EVERY DAY 90 tablet 1  . escitalopram (LEXAPRO) 10 MG tablet TAKE 1 TABLET BY MOUTH DAILY 90 tablet 1  . lidocaine-prilocaine (EMLA) cream Apply 1 application topically as needed. 30 g 3  . pantoprazole (PROTONIX) 40 MG tablet TAKE 1 TABLET BY MOUTH EVERY DAY 30 tablet 5  . Cholecalciferol 1000 units capsule Take 1,000 Units by mouth daily.     . fluticasone (FLONASE) 50 MCG/ACT nasal spray Place into both nostrils daily.    Marland Kitchen gabapentin (NEURONTIN) 300 MG capsule Take 300 mg by mouth at bedtime.    Marland Kitchen HYDROcodone-acetaminophen (NORCO/VICODIN) 5-325 MG tablet TAKE 1 TABLET BY MOUTH EVERY 4 TO 6 HOURS AS NEEDED FOR PAIN WITH FOOD  0  . meloxicam (MOBIC) 15 MG tablet Mobic 15 mg tablet  Take 1 tablet every day by oral route with meals.    . methocarbamol (ROBAXIN) 500 MG tablet Take 500 mg by mouth 2 (two) times daily.  0  . naproxen (NAPROSYN) 500 MG tablet Take 1 tablet (500 mg total) by mouth 2 (two) times daily with a meal. (Patient not taking: Reported on 12/18/2017) 90 tablet 1  . ondansetron (ZOFRAN) 8 MG tablet Take 1 tablet (8 mg total) by mouth every 8 (eight) hours as needed for nausea or vomiting. 30 tablet 2  . ondansetron (ZOFRAN-ODT) 8 MG disintegrating tablet DISSOLVE 1 TABLET ON TONGUE EVERY 6 TO 8 HOURS AS NEEDED FOR NAUSEA OR VOMITING  0  . prochlorperazine (COMPAZINE) 10 MG tablet Take 1 tablet (10 mg total) by mouth every 6 (six) hours as needed for nausea or vomiting. 30 tablet 2   No current facility-administered medications for this visit.    Facility-Administered Medications Ordered in Other Visits  Medication Dose Route Frequency Provider Last Rate Last Dose  . heparin lock flush 100 unit/mL  500 Units Intravenous Once Lloyd Huger, MD      . sodium chloride flush (NS) 0.9 % injection 10 mL  10 mL Intravenous PRN Lloyd Huger, MD   10 mL at 08/16/16 0925  . sodium chloride flush  (NS) 0.9 % injection 10 mL  10 mL Intracatheter PRN Lloyd Huger, MD   10 mL at 01/26/17 1343    OBJECTIVE: Vitals:   01/23/18 0924  BP: (!) 103/58  Pulse: 81  Resp: 18  Temp: (!) 96.3 F (35.7 C)     Body mass index is 29.26 kg/m.    ECOG FS:0 - Asymptomatic  General: Well-developed, well-nourished, no acute distress. Eyes: Pink conjunctiva, anicteric sclera. HEENT: Normocephalic, moist mucous membranes, clear oropharnyx. Lungs: Clear to auscultation bilaterally. Heart: Regular rate and rhythm. No rubs, murmurs, or gallops. Abdomen: Soft, nontender, nondistended. No organomegaly noted, normoactive bowel sounds. Musculoskeletal: No edema, cyanosis, or clubbing. Neuro: Alert, answering all questions appropriately. Cranial nerves grossly intact. Skin: No rashes or petechiae noted. Psych: Normal affect.  LAB RESULTS:  Lab Results  Component Value Date   NA 137 01/23/2018   K 4.0 01/23/2018   CL 106 01/23/2018   CO2 23 01/23/2018   GLUCOSE 100 (H) 01/23/2018  BUN 20 01/23/2018   CREATININE 0.75 01/23/2018   CALCIUM 8.6 (L) 01/23/2018   PROT 7.3 01/23/2018   ALBUMIN 3.7 01/23/2018   AST 17 01/23/2018   ALT 13 01/23/2018   ALKPHOS 111 01/23/2018   BILITOT 0.4 01/23/2018   GFRNONAA >60 01/23/2018   GFRAA >60 01/23/2018    Lab Results  Component Value Date   WBC 6.4 01/23/2018   NEUTROABS 4.2 01/23/2018   HGB 10.9 (L) 01/23/2018   HCT 32.6 (L) 01/23/2018   MCV 82.2 01/23/2018   PLT 286 01/23/2018     STUDIES: Ct Abdomen Pelvis W Contrast  Result Date: 01/10/2018 CLINICAL DATA:  Peritoneal carcinomatosis EXAM: CT ABDOMEN AND PELVIS WITH CONTRAST TECHNIQUE: Multidetector CT imaging of the abdomen and pelvis was performed using the standard protocol following bolus administration of intravenous contrast. CONTRAST:  174mL ISOVUE-300 IOPAMIDOL (ISOVUE-300) INJECTION 61% COMPARISON:  10/26/2017 FINDINGS: Lower chest: Mild dependent atelectasis in the right lower  lobe. Hepatobiliary: Liver is grossly unremarkable. Mild scalloping overlying the right hepatic lobe (series 2/image 28), related to known peritoneal disease. Gallbladder is unremarkable. No intrahepatic or extrahepatic ductal dilatation. Pancreas: Within normal limits. Spleen: Within normal limits. Adrenals/Urinary Tract: Adrenal glands are within normal limits. Kidneys are within normal limits.  No hydronephrosis. Bladder is underdistended but unremarkable. Stomach/Bowel: Stomach is within normal limits. Loculated fluid/ascites inferior to the gastric antrum, now measuring 5.1 x 7.2 cm (series 2/image 34), previously 4.3 x 6.1 cm. No evidence of bowel obstruction. Soft tissue mass involving the base of the cecum (series 2/image 70) as well as the distal ileum (series 2/image 68). Mass measures approximately 4.6 x 2.8 x 2.9 cm in aggregate (series 2/image 69). Relative sparing of the terminal ileum at the ileocecal valve. Mild wall thickening involving the rectum anteriorly (series 2/image 80), possibly reflecting an overlying serosal implant. Vascular/Lymphatic: No evidence of abdominal aortic aneurysm. No suspicious abdominopelvic lymphadenopathy. Reproductive: Uterus and bilateral ovaries are poorly evaluated but grossly unremarkable. Other: Moderate abdominopelvic ascites, partially loculated, with omental caking/peritoneal disease. In addition to the loculated fluid inferior to the stomach, there is stable omental caking beneath the left mid abdominal wall (series 2/image 36) and scattered areas of loculated fluid versus soft tissue, for example a suspected 3.1 x 2.7 cm implant in the right mid abdomen (series 2/image 47), previously 3.8 x 2.4 cm. Overall, this appearance is considered unchanged. Musculoskeletal: Mild degenerative changes of the visualized thoracolumbar spine. IMPRESSION: Moderate abdominopelvic ascites with omental caking/peritoneal disease, overall grossly unchanged. Suspected 4.6 cm soft  tissue mass involving the base of the cecum as well as the distal ileum, as described above. Possible serosal implant along the anterior aspect of the rectum, equivocal. Electronically Signed   By: Julian Hy M.D.   On: 01/10/2018 11:08    ASSESSMENT: Peritoneal carcinomatosis, metastatic adenocarcinoma of likely lower GI primary.  PLAN:    1. Peritoneal carcinomatosis: Colonoscopy results reviewed and discussed with GI physician noting 2 distinct masses in patient's rectum and cecum.  CT scan results from January 10, 2018 reviewed independently and reported as above with no new areas of metastatic disease.  These are consistent with her previous pathology results where immunohistochemistry suggested lower GI primary.  Patient received her last treatment of FOLFIRI plus Avastin was on April 04, 2017.  It was previously determined that intraperitoneal chemotherapy would not be beneficial, but with such isolated occurrence patient was referred to surgery for reevaluation.  She has an appointment with [facility] next week.  Foundation  1 testing did not reveal any actionable mutations.  Regardless of her surgical status, patient will require chemotherapy.  Will hold Avastin for the first several cycles until her surgical evaluation has been completed.  Proceed with cycle 1 of FOLFIRI today.  Return to clinic in 2 days for pump removal and then in 2 weeks for further evaluation and consideration of cycle 2.   3. History of neutropenia: Patient will require Neulasta with treatments. 4. Peripheral neuropathy: Resolved. 5. Ascites: Patient had a paracentesis on October 11, 2017 and removed 1.2 L of fluid.  Prior to that she had 700 mL removed on December 23, 2016 and 3.5 L on September 16, 2016. 6.  Blood in stool, change in consistency: There is concern of potential obstruction.  Surgical evaluation as above. 7.  Anemia: Patient's hemoglobin is 10.9, monitor closely with positive blood in stool.   Patient expressed  understanding and was in agreement with this plan. She also understands that She can call clinic at any time with any questions, concerns, or complaints.     Lloyd Huger, MD 01/26/18 9:37 AM

## 2018-01-23 ENCOUNTER — Other Ambulatory Visit: Payer: Self-pay

## 2018-01-23 ENCOUNTER — Inpatient Hospital Stay: Payer: BLUE CROSS/BLUE SHIELD

## 2018-01-23 ENCOUNTER — Inpatient Hospital Stay (HOSPITAL_BASED_OUTPATIENT_CLINIC_OR_DEPARTMENT_OTHER): Payer: BLUE CROSS/BLUE SHIELD | Admitting: Oncology

## 2018-01-23 VITALS — BP 103/58 | HR 81 | Temp 96.3°F | Resp 18 | Wt 160.0 lb

## 2018-01-23 DIAGNOSIS — C801 Malignant (primary) neoplasm, unspecified: Secondary | ICD-10-CM | POA: Diagnosis not present

## 2018-01-23 DIAGNOSIS — F419 Anxiety disorder, unspecified: Secondary | ICD-10-CM

## 2018-01-23 DIAGNOSIS — E785 Hyperlipidemia, unspecified: Secondary | ICD-10-CM

## 2018-01-23 DIAGNOSIS — C786 Secondary malignant neoplasm of retroperitoneum and peritoneum: Secondary | ICD-10-CM

## 2018-01-23 DIAGNOSIS — K921 Melena: Secondary | ICD-10-CM

## 2018-01-23 DIAGNOSIS — M1990 Unspecified osteoarthritis, unspecified site: Secondary | ICD-10-CM

## 2018-01-23 DIAGNOSIS — Z87891 Personal history of nicotine dependence: Secondary | ICD-10-CM

## 2018-01-23 DIAGNOSIS — Z79899 Other long term (current) drug therapy: Secondary | ICD-10-CM

## 2018-01-23 DIAGNOSIS — K219 Gastro-esophageal reflux disease without esophagitis: Secondary | ICD-10-CM

## 2018-01-23 LAB — COMPREHENSIVE METABOLIC PANEL
ALBUMIN: 3.7 g/dL (ref 3.5–5.0)
ALK PHOS: 111 U/L (ref 38–126)
ALT: 13 U/L (ref 0–44)
AST: 17 U/L (ref 15–41)
Anion gap: 8 (ref 5–15)
BILIRUBIN TOTAL: 0.4 mg/dL (ref 0.3–1.2)
BUN: 20 mg/dL (ref 6–20)
CO2: 23 mmol/L (ref 22–32)
Calcium: 8.6 mg/dL — ABNORMAL LOW (ref 8.9–10.3)
Chloride: 106 mmol/L (ref 98–111)
Creatinine, Ser: 0.75 mg/dL (ref 0.44–1.00)
GFR calc Af Amer: >60 mL/min (ref >60)
GFR calc non Af Amer: >60 mL/min (ref >60)
GLUCOSE: 100 mg/dL — AB (ref 70–99)
POTASSIUM: 4 mmol/L (ref 3.5–5.1)
Sodium: 137 mmol/L (ref 135–145)
TOTAL PROTEIN: 7.3 g/dL (ref 6.5–8.1)

## 2018-01-23 LAB — CBC WITH DIFFERENTIAL/PLATELET
BASOS ABS: 0.1 10*3/uL (ref 0–0.1)
Basophils Relative: 1 %
Eosinophils Absolute: 0.1 10*3/uL (ref 0–0.7)
Eosinophils Relative: 1 %
HCT: 32.6 % — ABNORMAL LOW (ref 35.0–47.0)
Hemoglobin: 10.9 g/dL — ABNORMAL LOW (ref 12.0–16.0)
LYMPHS PCT: 24 %
Lymphs Abs: 1.5 10*3/uL (ref 1.0–3.6)
MCH: 27.6 pg (ref 26.0–34.0)
MCHC: 33.5 g/dL (ref 32.0–36.0)
MCV: 82.2 fL (ref 80.0–100.0)
MONO ABS: 0.5 10*3/uL (ref 0.2–0.9)
MONOS PCT: 8 %
Neutro Abs: 4.2 10*3/uL (ref 1.4–6.5)
Neutrophils Relative %: 66 %
PLATELETS: 286 10*3/uL (ref 150–440)
RBC: 3.97 MIL/uL (ref 3.80–5.20)
RDW: 17.1 % — ABNORMAL HIGH (ref 11.5–14.5)
WBC: 6.4 10*3/uL (ref 3.6–11.0)

## 2018-01-23 MED ORDER — LEUCOVORIN CALCIUM INJECTION 350 MG
700.0000 mg | Freq: Once | INTRAVENOUS | Status: AC
  Administered 2018-01-23: 700 mg via INTRAVENOUS
  Filled 2018-01-23: qty 35

## 2018-01-23 MED ORDER — ATROPINE SULFATE 1 MG/ML IJ SOLN
0.5000 mg | Freq: Once | INTRAMUSCULAR | Status: AC | PRN
  Administered 2018-01-23: 0.5 mg via INTRAVENOUS
  Filled 2018-01-23: qty 1

## 2018-01-23 MED ORDER — SODIUM CHLORIDE 0.9 % IV SOLN
2400.0000 mg/m2 | INTRAVENOUS | Status: DC
  Administered 2018-01-23: 4100 mg via INTRAVENOUS
  Filled 2018-01-23: qty 82

## 2018-01-23 MED ORDER — PALONOSETRON HCL INJECTION 0.25 MG/5ML
0.2500 mg | Freq: Once | INTRAVENOUS | Status: AC
  Administered 2018-01-23: 0.25 mg via INTRAVENOUS
  Filled 2018-01-23: qty 5

## 2018-01-23 MED ORDER — IRINOTECAN HCL CHEMO INJECTION 100 MG/5ML
180.0000 mg/m2 | Freq: Once | INTRAVENOUS | Status: AC
  Administered 2018-01-23: 300 mg via INTRAVENOUS
  Filled 2018-01-23: qty 15

## 2018-01-23 MED ORDER — DEXAMETHASONE SODIUM PHOSPHATE 10 MG/ML IJ SOLN
10.0000 mg | Freq: Once | INTRAMUSCULAR | Status: AC
  Administered 2018-01-23: 10 mg via INTRAVENOUS
  Filled 2018-01-23: qty 1

## 2018-01-23 MED ORDER — SODIUM CHLORIDE 0.9 % IV SOLN
Freq: Once | INTRAVENOUS | Status: AC
  Administered 2018-01-23: 11:00:00 via INTRAVENOUS
  Filled 2018-01-23: qty 1000

## 2018-01-23 MED ORDER — SODIUM CHLORIDE 0.9% FLUSH
10.0000 mL | Freq: Once | INTRAVENOUS | Status: AC
  Administered 2018-01-23: 10 mL via INTRAVENOUS
  Filled 2018-01-23: qty 10

## 2018-01-23 MED ORDER — FLUOROURACIL CHEMO INJECTION 2.5 GM/50ML
400.0000 mg/m2 | Freq: Once | INTRAVENOUS | Status: AC
  Administered 2018-01-23: 700 mg via INTRAVENOUS
  Filled 2018-01-23: qty 14

## 2018-01-23 MED ORDER — HEPARIN SOD (PORK) LOCK FLUSH 100 UNIT/ML IV SOLN: 500.0000 [IU] | Freq: Once | INTRAVENOUS | Status: DC

## 2018-01-23 NOTE — Progress Notes (Signed)
Here for follow up. Stated she is having bright red blood in stool- up to 8 x per day she stated. Also stated mucous in stool noted.

## 2018-01-24 ENCOUNTER — Encounter: Payer: Self-pay | Admitting: Oncology

## 2018-01-24 ENCOUNTER — Other Ambulatory Visit: Payer: Self-pay | Admitting: *Deleted

## 2018-01-24 DIAGNOSIS — C786 Secondary malignant neoplasm of retroperitoneum and peritoneum: Secondary | ICD-10-CM

## 2018-01-24 DIAGNOSIS — C801 Malignant (primary) neoplasm, unspecified: Secondary | ICD-10-CM

## 2018-01-24 DIAGNOSIS — Z7189 Other specified counseling: Secondary | ICD-10-CM

## 2018-01-24 MED ORDER — ONDANSETRON HCL 8 MG PO TABS: 8.0000 mg | ORAL_TABLET | Freq: Three times a day (TID) | ORAL | 2 refills | Status: AC | PRN

## 2018-01-24 MED ORDER — PROCHLORPERAZINE MALEATE 10 MG PO TABS: 10.0000 mg | ORAL_TABLET | Freq: Four times a day (QID) | ORAL | 2 refills | Status: AC | PRN

## 2018-01-25 ENCOUNTER — Inpatient Hospital Stay: Payer: BLUE CROSS/BLUE SHIELD

## 2018-01-25 VITALS — BP 105/66 | HR 83 | Temp 97.0°F | Resp 18

## 2018-01-25 DIAGNOSIS — R112 Nausea with vomiting, unspecified: Secondary | ICD-10-CM

## 2018-01-25 DIAGNOSIS — C786 Secondary malignant neoplasm of retroperitoneum and peritoneum: Secondary | ICD-10-CM

## 2018-01-25 DIAGNOSIS — C801 Malignant (primary) neoplasm, unspecified: Principal | ICD-10-CM

## 2018-01-25 MED ORDER — ONDANSETRON HCL 4 MG/2ML IJ SOLN
8.0000 mg | Freq: Once | INTRAMUSCULAR | Status: AC
  Administered 2018-01-25: 8 mg via INTRAVENOUS
  Filled 2018-01-25: qty 4

## 2018-01-25 MED ORDER — HEPARIN SOD (PORK) LOCK FLUSH 100 UNIT/ML IV SOLN
500.0000 [IU] | Freq: Once | INTRAVENOUS | Status: AC | PRN
  Administered 2018-01-25: 500 [IU]
  Filled 2018-01-25: qty 5

## 2018-01-25 MED ORDER — SODIUM CHLORIDE 0.9% FLUSH
10.0000 mL | INTRAVENOUS | Status: DC | PRN
  Filled 2018-01-25: qty 10

## 2018-01-25 MED ORDER — HEPARIN SOD (PORK) LOCK FLUSH 100 UNIT/ML IV SOLN
INTRAVENOUS | Status: AC
  Filled 2018-01-25: qty 5

## 2018-01-25 MED ORDER — SODIUM CHLORIDE 0.9 % IV SOLN
Freq: Once | INTRAVENOUS | Status: AC
  Administered 2018-01-25: 15:00:00 via INTRAVENOUS
  Filled 2018-01-25: qty 1000

## 2018-01-25 MED ORDER — PEGFILGRASTIM INJECTION 6 MG/0.6ML ~~LOC~~
6.0000 mg | PREFILLED_SYRINGE | Freq: Once | SUBCUTANEOUS | Status: AC
  Administered 2018-01-25: 6 mg via SUBCUTANEOUS
  Filled 2018-01-25: qty 0.6

## 2018-01-25 MED ORDER — ONDANSETRON HCL 4 MG/2ML IJ SOLN
INTRAMUSCULAR | Status: AC
  Filled 2018-01-25: qty 2

## 2018-01-25 MED ORDER — SODIUM CHLORIDE 0.9 % IV SOLN: Freq: Once | INTRAVENOUS | Status: DC

## 2018-01-25 MED ORDER — DEXAMETHASONE SODIUM PHOSPHATE 10 MG/ML IJ SOLN
10.0000 mg | Freq: Once | INTRAMUSCULAR | Status: AC
  Administered 2018-01-25: 10 mg via INTRAVENOUS
  Filled 2018-01-25: qty 1

## 2018-01-26 ENCOUNTER — Other Ambulatory Visit: Payer: Self-pay | Admitting: Oncology

## 2018-01-26 ENCOUNTER — Inpatient Hospital Stay: Payer: BLUE CROSS/BLUE SHIELD

## 2018-01-26 VITALS — BP 139/78 | HR 79 | Temp 98.1°F | Resp 18

## 2018-01-26 DIAGNOSIS — C786 Secondary malignant neoplasm of retroperitoneum and peritoneum: Secondary | ICD-10-CM

## 2018-01-26 DIAGNOSIS — C801 Malignant (primary) neoplasm, unspecified: Principal | ICD-10-CM

## 2018-01-26 DIAGNOSIS — R112 Nausea with vomiting, unspecified: Secondary | ICD-10-CM

## 2018-01-26 MED ORDER — ONDANSETRON HCL 4 MG/2ML IJ SOLN
8.0000 mg | Freq: Once | INTRAMUSCULAR | Status: AC
  Administered 2018-01-26: 8 mg via INTRAVENOUS
  Filled 2018-01-26: qty 4

## 2018-01-26 MED ORDER — SODIUM CHLORIDE 0.9 % IV SOLN: Freq: Once | INTRAVENOUS | Status: DC

## 2018-01-26 MED ORDER — SODIUM CHLORIDE 0.9 % IV SOLN
INTRAVENOUS | Status: DC
  Administered 2018-01-26: 14:00:00 via INTRAVENOUS
  Filled 2018-01-26 (×2): qty 1000

## 2018-01-26 MED ORDER — SODIUM CHLORIDE 0.9 % IV SOLN
Freq: Once | INTRAVENOUS | Status: AC
  Filled 2018-01-26: qty 1000

## 2018-01-26 MED ORDER — HEPARIN SOD (PORK) LOCK FLUSH 100 UNIT/ML IV SOLN
500.0000 [IU] | Freq: Once | INTRAVENOUS | Status: AC
  Administered 2018-01-26: 500 [IU] via INTRAVENOUS

## 2018-01-27 ENCOUNTER — Emergency Department: Payer: BLUE CROSS/BLUE SHIELD

## 2018-01-27 ENCOUNTER — Emergency Department
Admission: EM | Admit: 2018-01-27 | Discharge: 2018-01-28 | Disposition: A | Discharge status: Home / Self Care | Payer: BLUE CROSS/BLUE SHIELD | Attending: Emergency Medicine | Admitting: Emergency Medicine

## 2018-01-27 ENCOUNTER — Encounter: Payer: Self-pay | Admitting: Radiology

## 2018-01-27 ENCOUNTER — Other Ambulatory Visit: Payer: Self-pay

## 2018-01-27 DIAGNOSIS — R1030 Lower abdominal pain, unspecified: Secondary | ICD-10-CM | POA: Insufficient documentation

## 2018-01-27 DIAGNOSIS — Z87891 Personal history of nicotine dependence: Secondary | ICD-10-CM | POA: Insufficient documentation

## 2018-01-27 DIAGNOSIS — Z79899 Other long term (current) drug therapy: Secondary | ICD-10-CM | POA: Insufficient documentation

## 2018-01-27 DIAGNOSIS — F419 Anxiety disorder, unspecified: Secondary | ICD-10-CM | POA: Diagnosis not present

## 2018-01-27 DIAGNOSIS — R112 Nausea with vomiting, unspecified: Secondary | ICD-10-CM | POA: Diagnosis not present

## 2018-01-27 LAB — COMPREHENSIVE METABOLIC PANEL
ALBUMIN: 3.9 g/dL (ref 3.5–5.0)
ALT: 12 U/L (ref 0–44)
AST: 16 U/L (ref 15–41)
Alkaline Phosphatase: 114 U/L (ref 38–126)
Anion gap: 8 (ref 5–15)
BILIRUBIN TOTAL: 0.6 mg/dL (ref 0.3–1.2)
BUN: 14 mg/dL (ref 6–20)
CO2: 25 mmol/L (ref 22–32)
Calcium: 8.8 mg/dL — ABNORMAL LOW (ref 8.9–10.3)
Chloride: 106 mmol/L (ref 98–111)
Creatinine, Ser: 0.65 mg/dL (ref 0.44–1.00)
GFR calc non Af Amer: >60 mL/min (ref >60)
Glucose, Bld: 118 mg/dL — ABNORMAL HIGH (ref 70–99)
POTASSIUM: 3.4 mmol/L — AB (ref 3.5–5.1)
Sodium: 139 mmol/L (ref 135–145)
TOTAL PROTEIN: 7.3 g/dL (ref 6.5–8.1)

## 2018-01-27 LAB — CBC WITH DIFFERENTIAL/PLATELET
Basophils Absolute: 0 10*3/uL (ref 0–0.1)
Basophils Relative: 0 %
EOS PCT: 1 %
Eosinophils Absolute: 0.2 10*3/uL (ref 0–0.7)
HEMATOCRIT: 34.4 % — AB (ref 35.0–47.0)
Hemoglobin: 11.5 g/dL — ABNORMAL LOW (ref 12.0–16.0)
Lymphocytes Relative: 8 %
Lymphs Abs: 1.6 10*3/uL (ref 1.0–3.6)
MCH: 27.5 pg (ref 26.0–34.0)
MCHC: 33.4 g/dL (ref 32.0–36.0)
MCV: 82.3 fL (ref 80.0–100.0)
Monocytes Absolute: 0.6 10*3/uL (ref 0.2–0.9)
Monocytes Relative: 3 %
NEUTROS PCT: 88 %
Neutro Abs: 17.8 10*3/uL — ABNORMAL HIGH (ref 1.4–6.5)
Platelets: 202 10*3/uL (ref 150–440)
RBC: 4.18 MIL/uL (ref 3.80–5.20)
RDW: 16.7 % — ABNORMAL HIGH (ref 11.5–14.5)
WBC: 20.2 10*3/uL — AB (ref 3.6–11.0)

## 2018-01-27 LAB — LACTIC ACID, PLASMA: LACTIC ACID, VENOUS: 1.5 mmol/L (ref 0.5–1.9)

## 2018-01-27 MED ORDER — IOPAMIDOL (ISOVUE-300) INJECTION 61%
100.0000 mL | Freq: Once | INTRAVENOUS | Status: AC | PRN
  Administered 2018-01-27: 100 mL via INTRAVENOUS

## 2018-01-27 MED ORDER — SODIUM CHLORIDE 0.9 % IV BOLUS
1000.0000 mL | Freq: Once | INTRAVENOUS | Status: AC
  Administered 2018-01-27: 1000 mL via INTRAVENOUS

## 2018-01-27 NOTE — ED Provider Notes (Signed)
[facility] Emergency Department Provider Note   ____________________________________________   First MD Initiated Contact with Patient 01/27/18 2304     (approximate)  I have reviewed the triage vital signs and the nursing notes.   HISTORY  Chief Complaint Weakness; Abdominal Pain; and Emesis    HPI Valerie Horton is a 57 y.o. female who comes into the hospital today with some severe lower abdominal pain and cramping.  The patient is also had some vomiting.  The pain started this afternoon around 4 PM but the patient has been having vomiting since she completed chemotherapy on Thursday.  The patient states that she is had some yellowish clear emesis.  She was at the cancer center on Thursday and Friday receiving fluids and she states that she was able to keep some foods down yesterday.  She reports though that she has not had a good bowel movement since Monday.  She is never had this pain before so she is unsure if she may be obstructed.  She states that with her last colonoscopy she did not have any blockages although she does have a tumor in her cecum as well as in her rectum.  The patient states that right now her pain has subsided but she is unsure how long this can last.  She did not take any medication for pain at home.  The pain is in her lower abdomen but the patient denies any pain with urination or fevers.  The patient is here today for evaluation of her symptoms.  Past Medical History:  Diagnosis Date  . Acid reflux   . Allergic rhinitis   . Anxiety   . Arthritis    left knee  . Cervical spondylosis   . CTS (carpal tunnel syndrome)    Right  . Diverticulosis   . HSV-2 (herpes simplex virus 2) infection   . Hyperlipidemia   . Ovarian failure   . Peritoneal carcinomatosis ([place]) 01/2016   chemo tx's.   . Rosacea   . Wears contact lenses     Patient Active Problem List   Diagnosis Date Noted  . Change in stool habits   . Neoplasm of  digestive system   . Early satiety   . Acute gastritis without hemorrhage   . Osteoarthritis of knee 11/14/2017  . Trigger thumb of right hand 11/14/2017  . Adenomatous colon polyp 10/25/2016  . Allergic rhinitis 10/25/2016  . Cervical spondylosis 10/25/2016  . Xerostomia 10/25/2016  . Goals of care, counseling/discussion 07/25/2016  . Anxiety disorder 03/10/2016  . Acne rosacea   . HSV-2 infection   . Hyperlipidemia LDL goal <130   . Abnormal findings-gastrointestinal tract   . Other specified diseases of intestine   . Peritoneal carcinomatosis ([place]) 01/20/2016  . Anxiety 07/02/2014  . Arthritis 12/04/2012  . Sciatica of right side 12/04/2012    Past Surgical History:  Procedure Laterality Date  . ANTERIOR CRUCIATE LIGAMENT REPAIR Left   . CESAREAN SECTION    . COLONOSCOPY WITH PROPOFOL N/A 01/29/2016   Procedure: COLONOSCOPY WITH PROPOFOL;  Surgeon: Lucilla Lame, MD;  Location: [place];  Service: Endoscopy;  Laterality: N/A;  . COLONOSCOPY WITH PROPOFOL N/A 01/01/2018   Procedure: COLONOSCOPY WITH PROPOFOL;  Surgeon: Lucilla Lame, MD;  Location: [place];  Service: Endoscopy;  Laterality: N/A;  . ESOPHAGOGASTRODUODENOSCOPY (EGD) WITH PROPOFOL N/A 01/29/2016   Procedure: ESOPHAGOGASTRODUODENOSCOPY (EGD) WITH PROPOFOL;  Surgeon: Lucilla Lame, MD;  Location: [place];  Service: Endoscopy;  Laterality: N/A;  . ESOPHAGOGASTRODUODENOSCOPY (EGD) WITH PROPOFOL N/A 01/01/2018   Procedure: ESOPHAGOGASTRODUODENOSCOPY (EGD) WITH PROPOFOL;  Surgeon: Lucilla Lame, MD;  Location: [place];  Service: Endoscopy;  Laterality: N/A;  . PERIPHERAL VASCULAR CATHETERIZATION N/A 02/10/2016   Procedure: Glori Luis Cath Insertion;  Surgeon: Algernon Huxley, MD;  Location: [place] [facility];  Service: Cardiovascular;  Laterality: N/A;    Prior to Admission medications   Medication Sig Start Date End Date Taking? Authorizing Provider  acyclovir (ZOVIRAX) 800 MG tablet TAKE  1 TABLET BY MOUTH EVERY DAY 03/22/17   Park Liter P, DO  Cholecalciferol 1000 units capsule Take 1,000 Units by mouth daily.     [provider]  escitalopram (LEXAPRO) 10 MG tablet TAKE 1 TABLET BY MOUTH DAILY 09/06/17   Johnson, Megan P, DO  fluticasone (FLONASE) 50 MCG/ACT nasal spray Place into both nostrils daily.    [provider]  gabapentin (NEURONTIN) 300 MG capsule Take 300 mg by mouth at bedtime.    [provider]  HYDROcodone-acetaminophen (NORCO/VICODIN) 5-325 MG tablet TAKE 1 TABLET BY MOUTH EVERY 4 TO 6 HOURS AS NEEDED FOR PAIN WITH FOOD 09/14/17   [provider]  lidocaine-prilocaine (EMLA) cream Apply 1 application topically as needed. 09/13/16   Lloyd Huger, MD  meloxicam (MOBIC) 15 MG tablet Mobic 15 mg tablet  Take 1 tablet every day by oral route with meals.    [provider]  methocarbamol (ROBAXIN) 500 MG tablet Take 500 mg by mouth 2 (two) times daily. 11/20/17   [provider]  naproxen (NAPROSYN) 500 MG tablet Take 1 tablet (500 mg total) by mouth 2 (two) times daily with a meal. Patient not taking: Reported on 12/18/2017 06/29/17   Park Liter P, DO  ondansetron (ZOFRAN) 8 MG tablet Take 1 tablet (8 mg total) by mouth every 8 (eight) hours as needed for nausea or vomiting. 01/24/18   Lloyd Huger, MD  ondansetron (ZOFRAN-ODT) 8 MG disintegrating tablet DISSOLVE 1 TABLET ON TONGUE EVERY 6 TO 8 HOURS AS NEEDED FOR NAUSEA OR VOMITING 09/14/17   [provider]  oxyCODONE-acetaminophen (PERCOCET/ROXICET) 5-325 MG tablet Take 1-2 tablets by mouth every 4 (four) hours as needed for severe pain. 01/28/18   Loney Hering, MD  pantoprazole (PROTONIX) 40 MG tablet TAKE 1 TABLET BY MOUTH EVERY DAY 01/02/18   Lloyd Huger, MD  polyethylene glycol [facility]) packet Take 17 g by mouth daily. 01/28/18   Loney Hering, MD  prochlorperazine (COMPAZINE) 10 MG tablet Take 1 tablet (10 mg total) by  mouth every 6 (six) hours as needed for nausea or vomiting. 01/24/18   Lloyd Huger, MD    Allergies Patient has no known allergies.  Family History  Adopted: Yes    Social History Social History   Tobacco Use  . Smoking status: Former Research scientist (life sciences)  . Smokeless tobacco: Never Used  . Tobacco comment: Quit in her 84's  Substance Use Topics  . Alcohol use: Yes    Alcohol/week: 0.0 oz    Comment: 1 drink/mo  . Drug use: No    Review of Systems  Constitutional: No fever/chills Eyes: No visual changes. ENT: No sore throat. Cardiovascular: Denies chest pain. Respiratory: Denies shortness of breath. Gastrointestinal:  abdominal pain.  nausea, vomiting, constipation. Genitourinary: Negative for dysuria. Musculoskeletal: Negative for back pain. Skin: Negative for rash. Neurological: Headache.   ____________________________________________   PHYSICAL EXAM:  VITAL SIGNS: ED Triage Vitals  Enc Vitals Group  BP 01/27/18 2126 (!) 132/59     Pulse Rate 01/27/18 2126 77     Resp 01/27/18 2126 18     Temp 01/27/18 2126 98.1 F (36.7 C)     Temp Source 01/27/18 2126 Oral     SpO2 01/27/18 2126 100 %     Weight 01/27/18 2127 155 lb (70.3 kg)     Height 01/27/18 2127 5\' 3"  (1.6 m)     Head Circumference --      Peak Flow --      Pain Score 01/27/18 2127 8     Pain Loc --      Pain Edu? --      Excl. in [place]? --     Constitutional: Alert and oriented. Well appearing and in mild distress. Eyes: Conjunctivae are normal. PERRL. EOMI. Head: Atraumatic. Nose: No congestion/rhinnorhea. Mouth/Throat: Mucous membranes are moist.  Oropharynx non-erythematous. Cardiovascular: Normal rate, regular rhythm. Grossly normal heart sounds.  Good peripheral circulation. Respiratory: Normal respiratory effort.  No retractions. Lungs CTAB. Gastrointestinal: Soft and nontender. Mild distention.  Diminished bowel sounds throughout Musculoskeletal: No lower extremity tenderness nor edema.    Neurologic:  Normal speech and language.  Skin:  Skin is warm, dry and intact.  Psychiatric: Mood and affect are normal.   ____________________________________________   LABS (all labs ordered are listed, but only abnormal results are displayed)  Labs Reviewed  COMPREHENSIVE METABOLIC PANEL - Abnormal; Notable for the following components:      Result Value   Potassium 3.4 (*)    Glucose, Bld 118 (*)    Calcium 8.8 (*)    All other components within normal limits  CBC WITH DIFFERENTIAL/PLATELET - Abnormal; Notable for the following components:   WBC 20.2 (*)    Hemoglobin 11.5 (*)    HCT 34.4 (*)    RDW 16.7 (*)    Neutro Abs 17.8 (*)    All other components within normal limits  LACTIC ACID, PLASMA  URINALYSIS, COMPLETE (UACMP) WITH MICROSCOPIC   ____________________________________________  EKG  none ____________________________________________  RADIOLOGY  ED MD interpretation: Chest x-ray: No active cardiopulmonary disease  CT abdomen and pelvis: Slight increase in moderate abdominal pelvic ascites from exam 2 weeks ago, partially loculated, grossly unchanged omental caking and peritoneal disease some of the suspected soft tissue deposits versus loculated fluid are not as well-defined, suspected soft tissue mass involving the base of the rectum and distal ileum on prior exam is not well-defined currently no bowel obstruction.  Wall thickening of the anterior rectum possible serosal implant not significantly changed.  Official radiology report(s): Dg Chest 2 View  Result Date: 01/27/2018 CLINICAL DATA:  Vomiting and generalized weakness. Chemotherapy for peritoneal cancer. EXAM: CHEST - 2 VIEW COMPARISON:  None. FINDINGS: No pneumothorax. A right Port-A-Cath is in good position. The heart, hila, and mediastinum are normal. No pulmonary nodules or masses. Haziness over the left base is likely due to overlapping soft tissues with no correlate on the lateral view. No  suspicious infiltrate. IMPRESSION: No active cardiopulmonary disease. Electronically Signed   By: Dorise Bullion III M.D   On: 01/27/2018 21:56   Ct Abdomen Pelvis W Contrast  Result Date: 01/28/2018 CLINICAL DATA:  Abdominal pain. Vomiting and weakness. History of peritoneal cancer, active chemotherapy. EXAM: CT ABDOMEN AND PELVIS WITH CONTRAST TECHNIQUE: Multidetector CT imaging of the abdomen and pelvis was performed using the standard protocol following bolus administration of intravenous contrast. CONTRAST:  154mL ISOVUE-300 IOPAMIDOL (ISOVUE-300) INJECTION 61% COMPARISON:  Most recent CT 01/10/2018, additional priors FINDINGS: Lower chest: Mild lung base atelectasis in right lower lobe scarring. No pleural fluid. Hepatobiliary: Scalloping of the right lobe of the liver is unchanged from prior exam, likely related to known peritoneal disease. No discrete hepatic lesion. Gallbladder physiologically distended, no calcified stone. No biliary dilatation. Pancreas: No ductal dilatation or inflammation. Spleen: Normal in size without focal abnormality. Adrenals/Urinary Tract: No adrenal nodule. No hydronephrosis or perinephric edema. Homogeneous renal enhancement with symmetric excretion on delayed phase imaging. Urinary bladder is physiologically distended without wall thickening. Stomach/Bowel: The stomach is nondistended. Loculated fluid/ascites inferior to the gastric antrum appears similar allowing for differences in scan plane. Small bowel mass involving the base of the cecum and terminal ileum is not as well-defined on the current exam in the absence of enteric contrast, however appears grossly similar. Small to moderate stool in the ascending, transverse, proximal descending colon. Sigmoid colon is decompressed with mild colonic diverticula. Mild wall thickening involving the anterior rectum appears similar to prior exam. No evidence of acute bowel inflammation or obstruction. Vascular/Lymphatic: Normal  caliber abdominal aorta and abdominal vasculature. No abdominopelvic adenopathy. Reproductive: Ascites partially obscures evaluation of the uterus and ovaries, which are grossly unchanged. Other: Moderate volume of abdominopelvic ascites, partially loculated, slight increase from prior exam. Again seen omental caking and peritoneal disease. This suspected peritoneal implant in the right mid abdomen on prior exam is not as well-defined in the absence of enteric contrast and adjacent ascites. This area tentatively measures 3 x 2.8 cm axial image 47 series 2, previously 3.1 x 2.7 cm. No free air. Musculoskeletal: There are no acute or suspicious osseous abnormalities. Mild degenerative change in the spine again seen. IMPRESSION: 1. Slight increase in moderate abdominopelvic ascites from exam 2 now weeks prior, partially loculated. Grossly unchanged omental caking and peritoneal disease. Some of the suspected soft tissue deposits versus loculated fluid are not as well-defined currently. 2. The suspected soft tissue mass involving the base of the cecum and distal ileum on prior exam is not as well-defined currently, no bowel obstruction. 3. Wall thickening of the anterior rectum, possible serosal implant, not significantly changed. Electronically Signed   By: Jeb Levering M.D.   On: 01/28/2018 00:58    ____________________________________________   PROCEDURES  Procedure(s) performed: None  Procedures  Critical Care performed: No  ____________________________________________   INITIAL IMPRESSION / ASSESSMENT AND PLAN / ED COURSE  As part of my medical decision making, I reviewed the following data within the electronic MEDICAL RECORD NUMBER Notes from prior ED visits and [place] Controlled Substance Database   This is a 57 year old female who comes into the hospital today with some severe lower abdominal pain.  The patient does have a history of peritoneal cancer with some metastases and recently had  chemotherapy this week.  The patient also received some Neulasta to help boost her white blood cells.  The patient did have some blood work to include a CBC and a CMP.  She does have a white blood cell count of 20.2 but is unknown if that is due to the vomiting and pain or if that is from her Neulasta.  The patient's lactic acid is unremarkable and her CMP is negative.  I will send the patient for a CT scan of her abdomen and pelvis.  I will also check a urinalysis to ensure that the patient does not have a UTI.  She will receive a liter of normal saline and she will be reassessed.  The patient's CT scan shows some partial loculation of her ascitic fluid but mostly unchanged from her previous CT scans.  I did have a discussion with the patient regarding her abdominal pain as well as her slight constipation.  The loculation of her fluid could be due to proteinaceous material from her cancer.  Since her pain is improved and she is not febrile I will not treat her with antibiotics.  I will give her some MiraLAX to help her have a full bowel movement as well as some pain medicine should the pain return.  I did inform the patient though that should the pain persist she should return to the emergency department.  She has not had any vomiting here and she will follow back up with her oncologist.      ____________________________________________   FINAL CLINICAL IMPRESSION(S) / ED DIAGNOSES  Final diagnoses:  Lower abdominal pain  Non-intractable vomiting with nausea, unspecified vomiting type     ED Discharge Orders        Ordered    polyethylene glycol (MIRALAX) packet  Daily,   Status:  Discontinued     01/28/18 0231    oxyCODONE-acetaminophen (PERCOCET/ROXICET) 5-325 MG tablet  Every 4 hours PRN     01/28/18 0231    polyethylene glycol (MIRALAX) packet  Daily,   Status:  Discontinued     01/28/18 0232    polyethylene glycol (MIRALAX) packet  Daily     01/28/18 0235       Note:  This  document was prepared using Dragon voice recognition software and may include unintentional dictation errors.    Loney Hering, MD 01/28/18 [id number]

## 2018-01-27 NOTE — ED Triage Notes (Signed)
Pt states she has vomiting, generalized weakness and abd cramps following chemo on Tuesday. Pt states she has periteoneal cancer. Pt states does have a tumor in her rectum and she is concerned she has an obstruction. Skin pwd, resps unlabored. Pt denies known fever at home.

## 2018-01-28 MED ORDER — POLYETHYLENE GLYCOL 3350 17 G PO PACK: 17.0000 g | PACK | Freq: Every day | ORAL | 0 refills | Status: DC

## 2018-01-28 MED ORDER — OXYCODONE-ACETAMINOPHEN 5-325 MG PO TABS: 1.0000 | ORAL_TABLET | ORAL | 0 refills | Status: DC | PRN

## 2018-01-28 NOTE — ED Notes (Signed)
Patient transported to CT 

## 2018-01-28 NOTE — Discharge Instructions (Signed)
Please follow-up with Dr. Grayland Ormond.  Please return with any worsening symptoms or any other concerns.

## 2018-01-28 NOTE — ED Notes (Signed)
Pt returned from CT °

## 2018-01-29 ENCOUNTER — Encounter: Payer: Self-pay | Admitting: Oncology

## 2018-01-30 ENCOUNTER — Telehealth: Payer: Self-pay | Admitting: *Deleted

## 2018-01-30 ENCOUNTER — Ambulatory Visit: Payer: BLUE CROSS/BLUE SHIELD

## 2018-01-30 NOTE — Telephone Encounter (Signed)
Per Beckey Rutter, NP's request, called patient to follow up on ED visit over the weekend. Patient stated that she was feeling better, although she was scared to eat or drink much of anything because of the horrific stomach pain and cramping she had over the weekend. Offered patient appointment to come into the clinic and see NP for follow up with labs and possible IV fluids. Patient refused to make appointment to come in to see NP, and stated that she will not be taking any more chemotherapy treatments until she talks to Dr. Grayland Ormond face to face. She stated that the last chemotherapy treatment made her deathly ill, and she is not sure if she wants to do anymore treatments. NP notified of patient's response.     dhs

## 2018-01-31 ENCOUNTER — Telehealth: Payer: Self-pay | Admitting: Oncology

## 2018-01-31 NOTE — Telephone Encounter (Signed)
Called to touch base with patient given she recently canceled her appt for cycle 2 FOLFIRI. Left VM. Will attempt to call back tomorrow.  Faythe Casa, NP 01/31/2018 3:24 PM

## 2018-02-06 ENCOUNTER — Other Ambulatory Visit: Payer: BLUE CROSS/BLUE SHIELD

## 2018-02-06 ENCOUNTER — Ambulatory Visit: Payer: BLUE CROSS/BLUE SHIELD

## 2018-02-06 ENCOUNTER — Ambulatory Visit: Payer: BLUE CROSS/BLUE SHIELD | Admitting: Oncology

## 2018-02-07 ENCOUNTER — Encounter: Payer: Self-pay | Admitting: Oncology

## 2018-02-11 NOTE — Progress Notes (Signed)
[place]  [phone] [phone]) [phone]  ID: SYBIL SHRADER OB: Oct 20, 1960  MR#: [id number]  SHF#:[id number]  Patient Care Team: Valerie Roys, DO as PCP - General (Family Medicine) Clent Jacks, RN as Registered Nurse Garnetta Buddy, MD as Consulting Physician (Internal Medicine)  CHIEF COMPLAINT: Peritoneal carcinomatosis, metastatic adenocarcinoma of likely lower GI primary.  INTERVAL HISTORY: Patient returns to clinic today for further evaluation and discussion of her treatment options.  She recently reinitiated FOLFIRI for progressive malignancy and had significant nausea, abdominal pain, and diarrhea.  This has resolved and she is back to her baseline.  She had consultation at [facility] surgery he did not think surgical intervention was an option other than diverting colostomy to prevent obstruction.  She has no neurologic complaints.  She denies any fevers.  She has no chest pain or shortness of breath.  She has no urinary complaints.  Patient offers no further specific complaints today.  REVIEW OF SYSTEMS:   Review of Systems  Constitutional: Negative.  Negative for fever, malaise/fatigue and weight loss.  Respiratory: Negative.  Negative for cough and shortness of breath.   Cardiovascular: Negative.  Negative for chest pain and leg swelling.  Gastrointestinal: Negative.  Negative for abdominal pain, blood in stool, constipation, diarrhea, melena and vomiting.  Genitourinary: Negative.  Negative for dysuria.  Musculoskeletal: Negative.  Negative for back pain.  Skin: Negative.  Negative for rash.  Neurological: Negative.  Negative for sensory change, focal weakness, weakness and headaches.  Psychiatric/Behavioral: The patient is nervous/anxious.     As per HPI. Otherwise, a complete review of systems is negative.  PAST MEDICAL HISTORY: Past Medical History:  Diagnosis Date  . Acid reflux   . Allergic rhinitis   . Anxiety   . Arthritis     left knee  . Cervical spondylosis   . CTS (carpal tunnel syndrome)    Right  . Diverticulosis   . HSV-2 (herpes simplex virus 2) infection   . Hyperlipidemia   . Ovarian failure   . Peritoneal carcinomatosis ([place]) 01/2016   chemo tx's.   . Rosacea   . Wears contact lenses     PAST SURGICAL HISTORY: Past Surgical History:  Procedure Laterality Date  . ANTERIOR CRUCIATE LIGAMENT REPAIR Left   . CESAREAN SECTION    . COLONOSCOPY WITH PROPOFOL N/A 01/29/2016   Procedure: COLONOSCOPY WITH PROPOFOL;  Surgeon: Lucilla Lame, MD;  Location: [place];  Service: Endoscopy;  Laterality: N/A;  . COLONOSCOPY WITH PROPOFOL N/A 01/01/2018   Procedure: COLONOSCOPY WITH PROPOFOL;  Surgeon: Lucilla Lame, MD;  Location: [place];  Service: Endoscopy;  Laterality: N/A;  . ESOPHAGOGASTRODUODENOSCOPY (EGD) WITH PROPOFOL N/A 01/29/2016   Procedure: ESOPHAGOGASTRODUODENOSCOPY (EGD) WITH PROPOFOL;  Surgeon: Lucilla Lame, MD;  Location: [place];  Service: Endoscopy;  Laterality: N/A;  . ESOPHAGOGASTRODUODENOSCOPY (EGD) WITH PROPOFOL N/A 01/01/2018   Procedure: ESOPHAGOGASTRODUODENOSCOPY (EGD) WITH PROPOFOL;  Surgeon: Lucilla Lame, MD;  Location: [place];  Service: Endoscopy;  Laterality: N/A;  . PERIPHERAL VASCULAR CATHETERIZATION N/A 02/10/2016   Procedure: Glori Luis Cath Insertion;  Surgeon: Algernon Huxley, MD;  Location: [place] [facility];  Service: Cardiovascular;  Laterality: N/A;    FAMILY HISTORY: Unknown.  Patient is adopted.     ADVANCED DIRECTIVES:    HEALTH MAINTENANCE: Social History   Tobacco Use  . Smoking status: Former Research scientist (life sciences)  . Smokeless tobacco: Never Used  . Tobacco comment: Quit in her 51's  Substance Use Topics  .  Alcohol use: Yes    Alcohol/week: 0.0 standard drinks    Comment: 1 drink/mo  . Drug use: No     Colonoscopy:  PAP:  Bone density:  Lipid panel:  No Known Allergies  Current Outpatient Medications  Medication Sig  Dispense Refill  . acyclovir (ZOVIRAX) 800 MG tablet TAKE 1 TABLET BY MOUTH EVERY DAY 90 tablet 1  . Cholecalciferol 1000 units capsule Take 1,000 Units by mouth daily.     Marland Kitchen escitalopram (LEXAPRO) 10 MG tablet TAKE 1 TABLET BY MOUTH DAILY 90 tablet 1  . fluticasone (FLONASE) 50 MCG/ACT nasal spray Place into both nostrils daily.    Marland Kitchen gabapentin (NEURONTIN) 300 MG capsule Take 300 mg by mouth at bedtime.    Marland Kitchen HYDROcodone-acetaminophen (NORCO/VICODIN) 5-325 MG tablet TAKE 1 TABLET BY MOUTH EVERY 4 TO 6 HOURS AS NEEDED FOR PAIN WITH FOOD  0  . lidocaine-prilocaine (EMLA) cream Apply 1 application topically as needed. 30 g 3  . meloxicam (MOBIC) 15 MG tablet Mobic 15 mg tablet  Take 1 tablet every day by oral route with meals.    . methocarbamol (ROBAXIN) 500 MG tablet Take 500 mg by mouth 2 (two) times daily.  0  . naproxen (NAPROSYN) 500 MG tablet Take 1 tablet (500 mg total) by mouth 2 (two) times daily with a meal. 90 tablet 1  . ondansetron (ZOFRAN) 8 MG tablet Take 1 tablet (8 mg total) by mouth every 8 (eight) hours as needed for nausea or vomiting. 30 tablet 2  . ondansetron (ZOFRAN-ODT) 8 MG disintegrating tablet DISSOLVE 1 TABLET ON TONGUE EVERY 6 TO 8 HOURS AS NEEDED FOR NAUSEA OR VOMITING  0  . oxyCODONE-acetaminophen (PERCOCET/ROXICET) 5-325 MG tablet Take 1-2 tablets by mouth every 4 (four) hours as needed for severe pain. 12 tablet 0  . pantoprazole (PROTONIX) 40 MG tablet TAKE 1 TABLET BY MOUTH EVERY DAY 30 tablet 5  . polyethylene glycol (MIRALAX) packet Take 17 g by mouth daily. 14 each 0  . prochlorperazine (COMPAZINE) 10 MG tablet Take 1 tablet (10 mg total) by mouth every 6 (six) hours as needed for nausea or vomiting. 30 tablet 2   No current facility-administered medications for this visit.    Facility-Administered Medications Ordered in Other Visits  Medication Dose Route Frequency Provider Last Rate Last Dose  . 0.9 %  sodium chloride infusion   Intravenous Once Lloyd Huger, MD      . heparin lock flush 100 unit/mL  500 Units Intravenous Once Lloyd Huger, MD      . sodium chloride flush (NS) 0.9 % injection 10 mL  10 mL Intravenous PRN Lloyd Huger, MD   10 mL at 08/16/16 0925  . sodium chloride flush (NS) 0.9 % injection 10 mL  10 mL Intracatheter PRN Lloyd Huger, MD   10 mL at 01/26/17 1343    OBJECTIVE: Vitals:   02/13/18 0958  BP: 113/76  Pulse: 89  Resp: 18  Temp: 97.9 F (36.6 C)     Body mass index is 27.65 kg/m.    ECOG FS:0 - Asymptomatic  General: Well-developed, well-nourished, no acute distress. Eyes: Pink conjunctiva, anicteric sclera. HEENT: Normocephalic, moist mucous membranes. Lungs: Clear to auscultation bilaterally. Heart: Regular rate and rhythm. No rubs, murmurs, or gallops. Abdomen: Soft, mildly distended, normoactive bowel sounds. Musculoskeletal: No edema, cyanosis, or clubbing. Neuro: Alert, answering all questions appropriately. Cranial nerves grossly intact. Skin: No rashes or petechiae noted. Psych: Normal affect.  LAB RESULTS:  Lab Results  Component Value Date   NA 139 01/27/2018   K 3.4 (L) 01/27/2018   CL 106 01/27/2018   CO2 25 01/27/2018   GLUCOSE 118 (H) 01/27/2018   BUN 14 01/27/2018   CREATININE 0.65 01/27/2018   CALCIUM 8.8 (L) 01/27/2018   PROT 7.3 01/27/2018   ALBUMIN 3.9 01/27/2018   AST 16 01/27/2018   ALT 12 01/27/2018   ALKPHOS 114 01/27/2018   BILITOT 0.6 01/27/2018   GFRNONAA >60 01/27/2018   GFRAA >60 01/27/2018    Lab Results  Component Value Date   WBC 20.2 (H) 01/27/2018   NEUTROABS 17.8 (H) 01/27/2018   HGB 11.5 (L) 01/27/2018   HCT 34.4 (L) 01/27/2018   MCV 82.3 01/27/2018   PLT 202 01/27/2018     STUDIES: Dg Chest 2 View  Result Date: 01/27/2018 CLINICAL DATA:  Vomiting and generalized weakness. Chemotherapy for peritoneal cancer. EXAM: CHEST - 2 VIEW COMPARISON:  None. FINDINGS: No pneumothorax. A right Port-A-Cath is in good position.  The heart, hila, and mediastinum are normal. No pulmonary nodules or masses. Haziness over the left base is likely due to overlapping soft tissues with no correlate on the lateral view. No suspicious infiltrate. IMPRESSION: No active cardiopulmonary disease. Electronically Signed   By: Dorise Bullion III M.D   On: 01/27/2018 21:56   Ct Abdomen Pelvis W Contrast  Result Date: 01/28/2018 CLINICAL DATA:  Abdominal pain. Vomiting and weakness. History of peritoneal cancer, active chemotherapy. EXAM: CT ABDOMEN AND PELVIS WITH CONTRAST TECHNIQUE: Multidetector CT imaging of the abdomen and pelvis was performed using the standard protocol following bolus administration of intravenous contrast. CONTRAST:  120mL ISOVUE-300 IOPAMIDOL (ISOVUE-300) INJECTION 61% COMPARISON:  Most recent CT 01/10/2018, additional priors FINDINGS: Lower chest: Mild lung base atelectasis in right lower lobe scarring. No pleural fluid. Hepatobiliary: Scalloping of the right lobe of the liver is unchanged from prior exam, likely related to known peritoneal disease. No discrete hepatic lesion. Gallbladder physiologically distended, no calcified stone. No biliary dilatation. Pancreas: No ductal dilatation or inflammation. Spleen: Normal in size without focal abnormality. Adrenals/Urinary Tract: No adrenal nodule. No hydronephrosis or perinephric edema. Homogeneous renal enhancement with symmetric excretion on delayed phase imaging. Urinary bladder is physiologically distended without wall thickening. Stomach/Bowel: The stomach is nondistended. Loculated fluid/ascites inferior to the gastric antrum appears similar allowing for differences in scan plane. Small bowel mass involving the base of the cecum and terminal ileum is not as well-defined on the current exam in the absence of enteric contrast, however appears grossly similar. Small to moderate stool in the ascending, transverse, proximal descending colon. Sigmoid colon is decompressed with  mild colonic diverticula. Mild wall thickening involving the anterior rectum appears similar to prior exam. No evidence of acute bowel inflammation or obstruction. Vascular/Lymphatic: Normal caliber abdominal aorta and abdominal vasculature. No abdominopelvic adenopathy. Reproductive: Ascites partially obscures evaluation of the uterus and ovaries, which are grossly unchanged. Other: Moderate volume of abdominopelvic ascites, partially loculated, slight increase from prior exam. Again seen omental caking and peritoneal disease. This suspected peritoneal implant in the right mid abdomen on prior exam is not as well-defined in the absence of enteric contrast and adjacent ascites. This area tentatively measures 3 x 2.8 cm axial image 47 series 2, previously 3.1 x 2.7 cm. No free air. Musculoskeletal: There are no acute or suspicious osseous abnormalities. Mild degenerative change in the spine again seen. IMPRESSION: 1. Slight increase in moderate abdominopelvic ascites from exam 2 now  weeks prior, partially loculated. Grossly unchanged omental caking and peritoneal disease. Some of the suspected soft tissue deposits versus loculated fluid are not as well-defined currently. 2. The suspected soft tissue mass involving the base of the cecum and distal ileum on prior exam is not as well-defined currently, no bowel obstruction. 3. Wall thickening of the anterior rectum, possible serosal implant, not significantly changed. Electronically Signed   By: Jeb Levering M.D.   On: 01/28/2018 00:58    ASSESSMENT: Peritoneal carcinomatosis, metastatic adenocarcinoma of likely lower GI primary.  PLAN:    1. Peritoneal carcinomatosis: Colonoscopy results reviewed and discussed with GI physician noting 2 distinct masses in patient's rectum and cecum.  CT scan results from January 28, 2018 reviewed independently with no obvious progression of disease.  Patient received 1 dose of FOLFIRI on January 23, 2018, but had significant  abdominal pain, nausea, and diarrhea approximately 3 to 4 days after treatment and has declined any further treatment with FOLFIRI.  She had surgical evaluation at [facility] who determined that surgical intervention is likely not possible, but a diverting colostomy may be considered for palliative reasons.  Previously, Foundation 1 testing did not reveal any actionable mutations.  Patient has consultation with [facility] oncology and will await their input prior to reinitiating treatment.  Possibilities discussed today included but not limited to [place] and [place].  Return to clinic on August 28 for further evaluation and treatment planning.   3. History of neutropenia: Resolved.   4. Peripheral neuropathy: Resolved. 5. Ascites: Patient had a paracentesis on October 11, 2017 and removed 1.2 L of fluid.  Prior to that she had 700 mL removed on December 23, 2016 and 3.5 L on September 16, 2016. 6.  Anemia: Hemoglobin improved to 11.5.  Monitor.   Patient expressed understanding and was in agreement with this plan. She also understands that She can call clinic at any time with any questions, concerns, or complaints.     Lloyd Huger, MD 02/16/18 3:36 PM

## 2018-02-13 ENCOUNTER — Inpatient Hospital Stay: Payer: BLUE CROSS/BLUE SHIELD | Attending: Oncology | Admitting: Oncology

## 2018-02-13 ENCOUNTER — Ambulatory Visit: Payer: BLUE CROSS/BLUE SHIELD | Admitting: Oncology

## 2018-02-13 ENCOUNTER — Encounter: Payer: Self-pay | Admitting: Oncology

## 2018-02-13 ENCOUNTER — Other Ambulatory Visit: Payer: BLUE CROSS/BLUE SHIELD

## 2018-02-13 VITALS — BP 113/76 | HR 89 | Temp 97.9°F | Resp 18 | Wt 156.1 lb

## 2018-02-13 DIAGNOSIS — K219 Gastro-esophageal reflux disease without esophagitis: Secondary | ICD-10-CM

## 2018-02-13 DIAGNOSIS — Z9221 Personal history of antineoplastic chemotherapy: Secondary | ICD-10-CM

## 2018-02-13 DIAGNOSIS — R531 Weakness: Secondary | ICD-10-CM | POA: Insufficient documentation

## 2018-02-13 DIAGNOSIS — E785 Hyperlipidemia, unspecified: Secondary | ICD-10-CM | POA: Diagnosis not present

## 2018-02-13 DIAGNOSIS — Z87891 Personal history of nicotine dependence: Secondary | ICD-10-CM | POA: Diagnosis not present

## 2018-02-13 DIAGNOSIS — C801 Malignant (primary) neoplasm, unspecified: Secondary | ICD-10-CM | POA: Insufficient documentation

## 2018-02-13 DIAGNOSIS — R112 Nausea with vomiting, unspecified: Secondary | ICD-10-CM | POA: Diagnosis not present

## 2018-02-13 DIAGNOSIS — C786 Secondary malignant neoplasm of retroperitoneum and peritoneum: Secondary | ICD-10-CM | POA: Diagnosis not present

## 2018-02-13 DIAGNOSIS — Z79899 Other long term (current) drug therapy: Secondary | ICD-10-CM

## 2018-02-13 DIAGNOSIS — D649 Anemia, unspecified: Secondary | ICD-10-CM | POA: Insufficient documentation

## 2018-02-13 NOTE — Progress Notes (Signed)
Patient here today to discuss treatment plan. 

## 2018-02-26 ENCOUNTER — Other Ambulatory Visit: Payer: Self-pay | Admitting: Family Medicine

## 2018-03-02 ENCOUNTER — Encounter: Payer: Self-pay | Admitting: Family Medicine

## 2018-03-02 ENCOUNTER — Other Ambulatory Visit: Payer: Self-pay | Admitting: *Deleted

## 2018-03-02 ENCOUNTER — Encounter: Payer: Self-pay | Admitting: Oncology

## 2018-03-02 MED ORDER — PREDNISONE 10 MG (21) PO TBPK: ORAL_TABLET | ORAL | 0 refills | Status: DC

## 2018-03-02 NOTE — Telephone Encounter (Signed)
Please call her and have her go to urgent care/ER ASAP.

## 2018-03-05 NOTE — Progress Notes (Signed)
[place]  [phone] [phone]  ID: DELAILAH SPIETH OB: 1961-06-13  MR#: [id number]  YTK#:[id number]  Patient Care Team: Valerie Roys, DO as PCP - General (Family Medicine) Clent Jacks, RN as Registered Nurse Garnetta Buddy, MD as Consulting Physician (Internal Medicine)  CHIEF COMPLAINT: Peritoneal carcinomatosis, metastatic adenocarcinoma of likely lower GI primary.  INTERVAL HISTORY: Patient returns to clinic today for further evaluation and further discussion of treatment options.  She recently had consultation at [facility] who recommended either enrolling in clinical trial or pursuing irinotecan plus Panitumumab every 2 weeks.  She also possibly [place] an [place] reaction at the beach and has swollen face and upper body.  CT scan did not reveal any evidence of SVC syndrome.  Her swelling is improved, but she is not back to her baseline.  She has no neurologic complaints.  She denies any fevers.  She has no chest pain or shortness of breath.  She denies any nausea, vomiting, constipation, or diarrhea.  She has no urinary complaints.  Patient offers no further specific complaints today.  REVIEW OF SYSTEMS:   Review of Systems  Constitutional: Negative.  Negative for fever, malaise/fatigue and weight loss.  Respiratory: Negative.  Negative for cough and shortness of breath.   Cardiovascular: Negative.  Negative for chest pain and leg swelling.  Gastrointestinal: Negative.  Negative for abdominal pain, blood in stool, constipation, diarrhea, melena and vomiting.  Genitourinary: Negative.  Negative for dysuria.  Musculoskeletal: Negative.  Negative for back pain.  Skin: Negative.  Negative for rash.  Neurological: Negative.  Negative for sensory change, focal weakness, weakness and headaches.  Psychiatric/Behavioral: The patient is nervous/anxious.     As per HPI. Otherwise, a complete review of systems is negative.  PAST MEDICAL  HISTORY: Past Medical History:  Diagnosis Date  . Acid reflux   . Allergic rhinitis   . Anxiety   . Arthritis    left knee  . Cervical spondylosis   . CTS (carpal tunnel syndrome)    Right  . Diverticulosis   . HSV-2 (herpes simplex virus 2) infection   . Hyperlipidemia   . Ovarian failure   . Peritoneal carcinomatosis ([place]) 01/2016   chemo tx's.   . Rosacea   . Wears contact lenses     PAST SURGICAL HISTORY: Past Surgical History:  Procedure Laterality Date  . ANTERIOR CRUCIATE LIGAMENT REPAIR Left   . CESAREAN SECTION    . COLONOSCOPY WITH PROPOFOL N/A 01/29/2016   Procedure: COLONOSCOPY WITH PROPOFOL;  Surgeon: Lucilla Lame, MD;  Location: [place];  Service: Endoscopy;  Laterality: N/A;  . COLONOSCOPY WITH PROPOFOL N/A 01/01/2018   Procedure: COLONOSCOPY WITH PROPOFOL;  Surgeon: Lucilla Lame, MD;  Location: [place];  Service: Endoscopy;  Laterality: N/A;  . ESOPHAGOGASTRODUODENOSCOPY (EGD) WITH PROPOFOL N/A 01/29/2016   Procedure: ESOPHAGOGASTRODUODENOSCOPY (EGD) WITH PROPOFOL;  Surgeon: Lucilla Lame, MD;  Location: [place];  Service: Endoscopy;  Laterality: N/A;  . ESOPHAGOGASTRODUODENOSCOPY (EGD) WITH PROPOFOL N/A 01/01/2018   Procedure: ESOPHAGOGASTRODUODENOSCOPY (EGD) WITH PROPOFOL;  Surgeon: Lucilla Lame, MD;  Location: [place];  Service: Endoscopy;  Laterality: N/A;  . PERIPHERAL VASCULAR CATHETERIZATION N/A 02/10/2016   Procedure: Glori Luis Cath Insertion;  Surgeon: Algernon Huxley, MD;  Location: [facility];  Service: Cardiovascular;  Laterality: N/A;    FAMILY HISTORY: Unknown.  Patient is adopted.     ADVANCED DIRECTIVES:    HEALTH MAINTENANCE: Social History   Tobacco Use  .  Smoking status: Former Research scientist (life sciences)  . Smokeless tobacco: Never Used  . Tobacco comment: Quit in her 57's  Substance Use Topics  . Alcohol use: Yes    Alcohol/week: 0.0 standard drinks    Comment: 1 drink/mo  . Drug use: No      Colonoscopy:  PAP:  Bone density:  Lipid panel:  No Known Allergies  Current Outpatient Medications  Medication Sig Dispense Refill  . acyclovir (ZOVIRAX) 800 MG tablet TAKE 1 TABLET BY MOUTH EVERY DAY 90 tablet 1  . Cholecalciferol 1000 units capsule Take 1,000 Units by mouth daily.     Marland Kitchen escitalopram (LEXAPRO) 10 MG tablet TAKE 1 TABLET BY MOUTH EVERY DAY 90 tablet 1  . lidocaine-prilocaine (EMLA) cream Apply 1 application topically as needed. 30 g 3  . methocarbamol (ROBAXIN) 500 MG tablet Take 500 mg by mouth 2 (two) times daily.  0  . naproxen (NAPROSYN) 500 MG tablet Take 1 tablet (500 mg total) by mouth 2 (two) times daily with a meal. 90 tablet 1  . pantoprazole (PROTONIX) 40 MG tablet TAKE 1 TABLET BY MOUTH EVERY DAY 30 tablet 5  . diphenhydrAMINE (BENADRYL) 25 MG tablet Take 25 mg by mouth every 6 (six) hours as needed.    . fluticasone (FLONASE) 50 MCG/ACT nasal spray Place into both nostrils daily.    Marland Kitchen gabapentin (NEURONTIN) 300 MG capsule Take 300 mg by mouth at bedtime.    Marland Kitchen HYDROcodone-acetaminophen (NORCO/VICODIN) 5-325 MG tablet TAKE 1 TABLET BY MOUTH EVERY 4 TO 6 HOURS AS NEEDED FOR PAIN WITH FOOD  0  . meloxicam (MOBIC) 15 MG tablet Mobic 15 mg tablet  Take 1 tablet every day by oral route with meals.    . ondansetron (ZOFRAN) 8 MG tablet Take 1 tablet (8 mg total) by mouth every 8 (eight) hours as needed for nausea or vomiting. (Patient not taking: Reported on 03/08/2018) 30 tablet 2  . ondansetron (ZOFRAN-ODT) 8 MG disintegrating tablet DISSOLVE 1 TABLET ON TONGUE EVERY 6 TO 8 HOURS AS NEEDED FOR NAUSEA OR VOMITING  0  . oxyCODONE-acetaminophen (PERCOCET/ROXICET) 5-325 MG tablet Take 1-2 tablets by mouth every 4 (four) hours as needed for severe pain. (Patient not taking: Reported on 03/08/2018) 12 tablet 0  . polyethylene glycol (MIRALAX) packet Take 17 g by mouth daily. (Patient not taking: Reported on 03/08/2018) 14 each 0  . Polyethylene Glycol 3350 ([employer] PURELAX  PO) Purelax 17 gram/dose oral powder  TAKE 17 G BY MOUTH DAILY FOR 14 DAYS    . prochlorperazine (COMPAZINE) 10 MG tablet Take 1 tablet (10 mg total) by mouth every 6 (six) hours as needed for nausea or vomiting. (Patient not taking: Reported on 03/08/2018) 30 tablet 2   No current facility-administered medications for this visit.    Facility-Administered Medications Ordered in Other Visits  Medication Dose Route Frequency Provider Last Rate Last Dose  . 0.9 %  sodium chloride infusion   Intravenous Once Lloyd Huger, MD      . heparin lock flush 100 unit/mL  500 Units Intravenous Once Lloyd Huger, MD      . sodium chloride flush (NS) 0.9 % injection 10 mL  10 mL Intravenous PRN Lloyd Huger, MD   10 mL at 08/16/16 0925  . sodium chloride flush (NS) 0.9 % injection 10 mL  10 mL Intracatheter PRN Lloyd Huger, MD   10 mL at 01/26/17 1343    OBJECTIVE: Vitals:   03/08/18 1459  BP: 122/70  Pulse: 92  Resp: 18  Temp: (!) 96.6 F (35.9 C)     Body mass index is 27.92 kg/m.    ECOG FS:0 - Asymptomatic  General: Well-developed, well-nourished, no acute distress. Eyes: Pink conjunctiva, anicteric sclera. HEENT: Normocephalic, moist mucous membranes, clear oropharnyx.  Facial swelling and mild edema of bilateral supraclavicular fossa.  No palpable lymphadenopathy. Lungs: Clear to auscultation bilaterally. Heart: Regular rate and rhythm. No rubs, murmurs, or gallops. Abdomen: Soft, nontender, nondistended. No organomegaly noted, normoactive bowel sounds. Musculoskeletal: No edema, cyanosis, or clubbing. Neuro: Alert, answering all questions appropriately. Cranial nerves grossly intact. Skin: No rashes or petechiae noted. Psych: Normal affect.  LAB RESULTS:  Lab Results  Component Value Date   NA 139 01/27/2018   K 3.4 (L) 01/27/2018   CL 106 01/27/2018   CO2 25 01/27/2018   GLUCOSE 118 (H) 01/27/2018   BUN 14 01/27/2018   CREATININE 0.65 01/27/2018    CALCIUM 8.8 (L) 01/27/2018   PROT 7.3 01/27/2018   ALBUMIN 3.9 01/27/2018   AST 16 01/27/2018   ALT 12 01/27/2018   ALKPHOS 114 01/27/2018   BILITOT 0.6 01/27/2018   GFRNONAA >60 01/27/2018   GFRAA >60 01/27/2018    Lab Results  Component Value Date   WBC 20.2 (H) 01/27/2018   NEUTROABS 17.8 (H) 01/27/2018   HGB 11.5 (L) 01/27/2018   HCT 34.4 (L) 01/27/2018   MCV 82.3 01/27/2018   PLT 202 01/27/2018     STUDIES: Ct Head W Wo Contrast  Result Date: 03/06/2018 CLINICAL DATA:  57 y/o F; swelling of the neck, face, shoulders, and eyes since last Wednesday. History of peritoneal carcinomatosis and chemotherapy. EXAM: CT HEAD WITHOUT AND WITH CONTRAST TECHNIQUE: Contiguous axial images were obtained from the base of the skull through the vertex without and with intravenous contrast CONTRAST:  63mL OMNIPAQUE IOHEXOL 300 MG/ML  SOLN COMPARISON:  Concurrent CT of the neck and cervical spine. 10/20/2016 PET-CT. FINDINGS: Brain: No evidence of acute infarction, hemorrhage, hydrocephalus, extra-axial collection or mass lesion/mass effect. After administration of intravenous contrast there is NO abnormal enhancement of the brain. Vascular: Mild calcific atherosclerosis of the carotid siphons. Normal enhancement of the central arterial and venous systems. Skull: Normal. Negative for fracture or focal lesion. Sinuses/Orbits: No acute finding. Other: None. IMPRESSION: Normal CT of the head with and without contrast. No intracranial metastasis identified. Electronically Signed   By: Kristine Garbe M.D.   On: 03/06/2018 16:55   Ct Soft Tissue Neck W Contrast  Result Date: 03/06/2018 CLINICAL DATA:  57 y/o F; swelling to the neck, face, shoulders, and eyes since last Wednesday. History of peritoneal carcinomatosis and chemotherapy. EXAM: CT NECK WITH CONTRAST TECHNIQUE: Multidetector CT imaging of the neck was performed using the standard protocol following the bolus administration of intravenous  contrast. CONTRAST:  35mL OMNIPAQUE IOHEXOL 300 MG/ML  SOLN COMPARISON:  Concurrent CT head and CT chest.  10/20/2016 PET-CT. FINDINGS: Pharynx and larynx: Normal. No mass or swelling. Salivary glands: No inflammation, mass, or stone. Thyroid: Normal. Lymph nodes: None enlarged or abnormal density. Vascular: Negative. Limited intracranial: Negative. Visualized orbits: Negative. Mastoids and visualized paranasal sinuses: Clear. Skeleton: No acute or aggressive process. Upper chest: Please refer to the concurrent CT of the chest for further evaluation of the lungs and mediastinum. Other: Mild edema within the subcutaneous fat of the anterior neck and supraclavicular fossa. IMPRESSION: Mild edema within the subcutaneous fat of the anterior neck and supraclavicular fossa. No associated mass or fluid collection  identified. Otherwise unremarkable CT of the neck. Electronically Signed   By: Kristine Garbe M.D.   On: 03/06/2018 16:46   Ct Chest W Contrast  Result Date: 03/06/2018 CLINICAL DATA:  Swelling to neck face and shoulders since last Wednesday, history of peritoneal carcinomatosis EXAM: CT CHEST WITH CONTRAST TECHNIQUE: Multidetector CT imaging of the chest was performed during intravenous contrast administration. Sagittal and coronal MPR images reconstructed from axial data set. CONTRAST:  46mL OMNIPAQUE IOHEXOL 300 MG/ML  SOLN IV. COMPARISON:  10/26/2017 FINDINGS: Cardiovascular: Aorta normal caliber. Thoracic vascular structures grossly patent on nondedicated exam. RIGHT jugular Port-A-Cath with tip in SVC near cavoatrial junction. Heart unremarkable. No pericardial effusion. Flow is identified within collaterals in the LEFT superior mediastinum to azygos system. SVC appears patent but does demonstrate an area of probable mild narrowing near the cavoatrial junction. Mediastinum/Nodes: Esophagus unremarkable. No thoracic adenopathy. Base of cervical region normal appearance. Lungs/Pleura: Mild  dependent atelectasis in the posterior lower lobes. Lungs otherwise clear. No pulmonary infiltrate, pleural effusion or pneumothorax. 3 mm LEFT upper lobe nodule image 34, better visualized on previous study. Upper Abdomen: Significant ascites in the upper abdomen. Omental thickening/infiltration. Musculoskeletal: No acute osseous findings. IMPRESSION: Ascites and omental thickening/infiltration in the upper abdomen again identified. Relative narrowing of the distal SVC near cavoatrial junction though appears to remain patent. No other intrathoracic abnormalities. Electronically Signed   By: Lavonia Dana M.D.   On: 03/06/2018 16:39    ASSESSMENT: Peritoneal carcinomatosis, metastatic adenocarcinoma of likely lower GI primary.  PLAN:    1. Peritoneal carcinomatosis: Colonoscopy results reviewed and discussed with GI physician noting 2 distinct masses in patient's rectum and cecum.  CT scan results from January 28, 2018 reviewed independently with no obvious other progression of disease.  Patient received 1 dose of FOLFIRI on January 23, 2018, but had significant abdominal pain, nausea, and diarrhea approximately 3 to 4 days after treatment and has declined any further treatment with FOLFIRI.  She had surgical evaluation at [facility] who determined that surgical intervention is likely not possible, but a diverting colostomy may be considered for palliative reasons.  Previously, Foundation 1 testing did not reveal any actionable mutations.  After consultation with [facility] oncology, patient will either pursue a clinical trial using immunotherapy plus Panitumumab or return to clinic to receive irinotecan plus Panitumumab every 2 weeks.  No follow-up is been scheduled at this time.  Patient will call clinic with her final decision. 2.  Ascites: This has not recurred.  Patient had a paracentesis on October 11, 2017 and removed 1.2 L of fluid.  Prior to that she had 700 mL removed on December 23, 2016 and 3.5 L on September 16, 2016. 3.   Facial swelling: CT scan results reviewed independently and reported as above with no obvious evidence of SVC syndrome or other anatomic abnormalities.  Although patient possibly may have had a reaction to Botox or some other contact, her symptoms did not resolve with Medrol Dosepak.  Unclear etiology.  Continue to monitor closely.  Can consider additional steroids or possibly Lasix in the near future if no resolution.  I spent a total of 30 minutes face-to-face with the patient of which greater than 50% of the visit was spent in counseling and coordination of care as detailed above.   Patient expressed understanding and was in agreement with this plan. She also understands that She can call clinic at any time with any questions, concerns, or complaints.     Kathlene November  Grayland Ormond, MD 03/11/18 7:36 PM

## 2018-03-06 ENCOUNTER — Ambulatory Visit
Admission: RE | Admit: 2018-03-06 | Discharge: 2018-03-06 | Disposition: A | Discharge status: Home / Self Care | Payer: BLUE CROSS/BLUE SHIELD | Source: Ambulatory Visit | Attending: Oncology | Admitting: Oncology

## 2018-03-06 ENCOUNTER — Inpatient Hospital Stay: Payer: BLUE CROSS/BLUE SHIELD | Attending: Oncology | Admitting: Oncology

## 2018-03-06 ENCOUNTER — Encounter: Payer: Self-pay | Admitting: Oncology

## 2018-03-06 VITALS — BP 132/81 | HR 87 | Temp 97.8°F | Resp 16 | Wt 155.0 lb

## 2018-03-06 DIAGNOSIS — C786 Secondary malignant neoplasm of retroperitoneum and peritoneum: Secondary | ICD-10-CM

## 2018-03-06 DIAGNOSIS — R110 Nausea: Secondary | ICD-10-CM | POA: Diagnosis not present

## 2018-03-06 DIAGNOSIS — C801 Malignant (primary) neoplasm, unspecified: Secondary | ICD-10-CM

## 2018-03-06 DIAGNOSIS — Z5112 Encounter for antineoplastic immunotherapy: Secondary | ICD-10-CM | POA: Diagnosis not present

## 2018-03-06 DIAGNOSIS — R188 Other ascites: Secondary | ICD-10-CM | POA: Diagnosis not present

## 2018-03-06 DIAGNOSIS — R197 Diarrhea, unspecified: Secondary | ICD-10-CM | POA: Insufficient documentation

## 2018-03-06 DIAGNOSIS — Z79899 Other long term (current) drug therapy: Secondary | ICD-10-CM | POA: Insufficient documentation

## 2018-03-06 DIAGNOSIS — K219 Gastro-esophageal reflux disease without esophagitis: Secondary | ICD-10-CM | POA: Diagnosis not present

## 2018-03-06 DIAGNOSIS — Z5111 Encounter for antineoplastic chemotherapy: Secondary | ICD-10-CM | POA: Diagnosis not present

## 2018-03-06 DIAGNOSIS — R600 Localized edema: Secondary | ICD-10-CM | POA: Diagnosis not present

## 2018-03-06 DIAGNOSIS — R109 Unspecified abdominal pain: Secondary | ICD-10-CM | POA: Diagnosis not present

## 2018-03-06 DIAGNOSIS — M1990 Unspecified osteoarthritis, unspecified site: Secondary | ICD-10-CM | POA: Insufficient documentation

## 2018-03-06 DIAGNOSIS — R609 Edema, unspecified: Secondary | ICD-10-CM | POA: Diagnosis present

## 2018-03-06 DIAGNOSIS — F419 Anxiety disorder, unspecified: Secondary | ICD-10-CM | POA: Insufficient documentation

## 2018-03-06 DIAGNOSIS — Z87891 Personal history of nicotine dependence: Secondary | ICD-10-CM | POA: Diagnosis not present

## 2018-03-06 DIAGNOSIS — R918 Other nonspecific abnormal finding of lung field: Secondary | ICD-10-CM | POA: Diagnosis not present

## 2018-03-06 DIAGNOSIS — E785 Hyperlipidemia, unspecified: Secondary | ICD-10-CM | POA: Insufficient documentation

## 2018-03-06 MED ORDER — IOHEXOL 350 MG/ML SOLN: 75.0000 mL | Freq: Once | INTRAVENOUS | Status: DC | PRN

## 2018-03-06 MED ORDER — IOHEXOL 300 MG/ML  SOLN
75.0000 mL | Freq: Once | INTRAMUSCULAR | Status: AC | PRN
  Administered 2018-03-06: 75 mL via INTRAVENOUS

## 2018-03-06 NOTE — Progress Notes (Signed)
Symptom Management Consult note [facility]  Telephone:([phone] [phone]  Patient Care Team: Valerie Roys, DO as PCP - General (Family Medicine) Clent Jacks, RN as Registered Nurse Garnetta Buddy, MD as Consulting Physician (Internal Medicine) .....  Name of the patient: Valerie Horton  [id number]  01-17-1961   Date of visit: 03/06/18  Diagnosis-peritoneal carcinomatosis  Chief complaint/ Reason for visit-facial swelling  Heme/Onc history: Patient was last seen by primary oncologist Dr. Grayland Ormond on 02/13/2018 to discuss further evaluation and treatment options.  She had recently reinitiated FOLFIRI for progression of malignancy and had significant nausea, abdominal pain and diarrhea and was admitted to the hospital for 3 to 4 days and has since declined any further treatment with FOLFIRI.  Surgical consultation at [facility] was determined that surgical intervention is likely not possible but a diverting colostomy could be placed for palliative reason.  Has consultation with [facility] oncology for second opinion regarding treatment.  Has history of ascites last paracentesis was in April 2019.  Oncology History   Patient with initial diagnosis in July 2017 where she presented to the emergency room for abdominal pain, bloating and fullness.  She was found to have ascites and a paracentesis was performed.  Ca 125 was elevated.  Omental biopsy was performed along with testing of the fluid from recent paracentesis.  Pathology revealed lower GI primary.  She was started on FOLFOX for 12 cycles.  Compleated 11 cycles on 07/21/2016.  Progression of disease was noted and therapy was switched. Began FOLFIRI in January 2018.  Received 18 cycles completing in October 2018.  She required ultrasound-guided paracentesis more frequient.  She was not a surgical candidate. Foundation one testing did not reveal any actionable mutations.  Restarted FOLFIRI chemotherapy in July  2019 with poor tolerance.      Peritoneal carcinomatosis ([place])   01/20/2016 Initial Diagnosis    Peritoneal carcinomatosis ([place])      Interval history-  Patient complains of edema. The location of the edema is face, head, neck.  The edema has been moderate.  Onset of symptoms was 4 days ago, gradually worsening since that time. The edema is present all day. The patient states never.  The swelling has been aggravated by uncertain, relieved by nothing, and been associated with an unclear etiology. Cardiac risk factors include none.  She states swelling began while she was at the beach.  She recently received Botox to her forehead, changed face creams, denies new medications, denies any known food allergies and had a recent right knee cortisone injection.  She denies any strenuous activity or lifting heavy objects.  She called clinic Saturday and was given a prescription for Medrol Dosepak with no relief.  States she thinks the swelling is worse.  She is worried this is her cancer.  ECOG FS:1 - Symptomatic but completely ambulatory  Review of systems- Review of Systems  Constitutional: Negative.  Negative for chills, fever, malaise/fatigue and weight loss.  HENT: Negative for congestion and ear pain.   Eyes: Negative.  Negative for blurred vision and double vision.  Respiratory: Negative.  Negative for cough, sputum production and shortness of breath.   Cardiovascular: Negative.  Negative for chest pain, palpitations and leg swelling.  Gastrointestinal: Negative.  Negative for abdominal pain, constipation, diarrhea, nausea and vomiting.  Genitourinary: Negative for dysuria, frequency and urgency.  Musculoskeletal: Positive for neck pain. Negative for back pain and falls.  Skin: Negative.  Negative for rash.  Facial swelling  Neurological: Negative.  Negative for weakness and headaches.  Endo/Heme/Allergies: Negative.  Does not bruise/bleed easily.  Psychiatric/Behavioral: Negative.   Negative for depression. The patient is not nervous/anxious and does not have insomnia.      Current treatment- S/p 1 cycle FOLFIRI- Given on 01/23/18  No Known Allergies   Past Medical History:  Diagnosis Date  . Acid reflux   . Allergic rhinitis   . Anxiety   . Arthritis    left knee  . Cervical spondylosis   . CTS (carpal tunnel syndrome)    Right  . Diverticulosis   . HSV-2 (herpes simplex virus 2) infection   . Hyperlipidemia   . Ovarian failure   . Peritoneal carcinomatosis ([place]) 01/2016   chemo tx's.   . Rosacea   . Wears contact lenses      Past Surgical History:  Procedure Laterality Date  . ANTERIOR CRUCIATE LIGAMENT REPAIR Left   . CESAREAN SECTION    . COLONOSCOPY WITH PROPOFOL N/A 01/29/2016   Procedure: COLONOSCOPY WITH PROPOFOL;  Surgeon: Lucilla Lame, MD;  Location: [place];  Service: Endoscopy;  Laterality: N/A;  . COLONOSCOPY WITH PROPOFOL N/A 01/01/2018   Procedure: COLONOSCOPY WITH PROPOFOL;  Surgeon: Lucilla Lame, MD;  Location: [place];  Service: Endoscopy;  Laterality: N/A;  . ESOPHAGOGASTRODUODENOSCOPY (EGD) WITH PROPOFOL N/A 01/29/2016   Procedure: ESOPHAGOGASTRODUODENOSCOPY (EGD) WITH PROPOFOL;  Surgeon: Lucilla Lame, MD;  Location: [place];  Service: Endoscopy;  Laterality: N/A;  . ESOPHAGOGASTRODUODENOSCOPY (EGD) WITH PROPOFOL N/A 01/01/2018   Procedure: ESOPHAGOGASTRODUODENOSCOPY (EGD) WITH PROPOFOL;  Surgeon: Lucilla Lame, MD;  Location: [place];  Service: Endoscopy;  Laterality: N/A;  . PERIPHERAL VASCULAR CATHETERIZATION N/A 02/10/2016   Procedure: Glori Luis Cath Insertion;  Surgeon: Algernon Huxley, MD;  Location: [place] [facility];  Service: Cardiovascular;  Laterality: N/A;    Social History   Socioeconomic History  . Marital status: Married    Spouse name: Not on file  . Number of children: 3  . Years of education: Not on file  . Highest education level: Not on file  Occupational History  .  Occupation: Agricultural engineer  Social Needs  . Financial resource strain: Not on file  . Food insecurity:    Worry: Not on file    Inability: Not on file  . Transportation needs:    Medical: Not on file    Non-medical: Not on file  Tobacco Use  . Smoking status: Former Research scientist (life sciences)  . Smokeless tobacco: Never Used  . Tobacco comment: Quit in her 48's  Substance and Sexual Activity  . Alcohol use: Yes    Alcohol/week: 0.0 standard drinks    Comment: 1 drink/mo  . Drug use: No  . Sexual activity: Yes  Lifestyle  . Physical activity:    Days per week: Not on file    Minutes per session: Not on file  . Stress: Not on file  Relationships  . Social connections:    Talks on phone: Not on file    Gets together: Not on file    Attends religious service: Not on file    Active member of club or organization: Not on file    Attends meetings of clubs or organizations: Not on file    Relationship status: Not on file  . Intimate partner violence:    Fear of current or ex partner: Not on file    Emotionally abused: Not on file    Physically abused: Not on  file    Forced sexual activity: Not on file  Other Topics Concern  . Not on file  Social History Narrative  . Not on file    Family History  Adopted: Yes     Current Outpatient Medications:  .  acyclovir (ZOVIRAX) 800 MG tablet, TAKE 1 TABLET BY MOUTH EVERY DAY, Disp: 90 tablet, Rfl: 1 .  diphenhydrAMINE (BENADRYL) 25 MG tablet, Take 25 mg by mouth every 6 (six) hours as needed., Disp: , Rfl:  .  escitalopram (LEXAPRO) 10 MG tablet, TAKE 1 TABLET BY MOUTH EVERY DAY, Disp: 90 tablet, Rfl: 1 .  methocarbamol (ROBAXIN) 500 MG tablet, Take 500 mg by mouth 2 (two) times daily., Disp: , Rfl: 0 .  pantoprazole (PROTONIX) 40 MG tablet, TAKE 1 TABLET BY MOUTH EVERY DAY, Disp: 30 tablet, Rfl: 5 .  polyethylene glycol (MIRALAX) packet, Take 17 g by mouth daily., Disp: 14 each, Rfl: 0 .  predniSONE (STERAPRED UNI-PAK 21 TAB) 10 MG (21) TBPK tablet,  Taper as directed, Disp: 21 tablet, Rfl: 0 .  Cholecalciferol 1000 units capsule, Take 1,000 Units by mouth daily. , Disp: , Rfl:  .  fluticasone (FLONASE) 50 MCG/ACT nasal spray, Place into both nostrils daily., Disp: , Rfl:  .  gabapentin (NEURONTIN) 300 MG capsule, Take 300 mg by mouth at bedtime., Disp: , Rfl:  .  HYDROcodone-acetaminophen (NORCO/VICODIN) 5-325 MG tablet, TAKE 1 TABLET BY MOUTH EVERY 4 TO 6 HOURS AS NEEDED FOR PAIN WITH FOOD, Disp: , Rfl: 0 .  lidocaine-prilocaine (EMLA) cream, Apply 1 application topically as needed. (Patient not taking: Reported on 03/06/2018), Disp: 30 g, Rfl: 3 .  meloxicam (MOBIC) 15 MG tablet, Mobic 15 mg tablet  Take 1 tablet every day by oral route with meals., Disp: , Rfl:  .  naproxen (NAPROSYN) 500 MG tablet, Take 1 tablet (500 mg total) by mouth 2 (two) times daily with a meal. (Patient not taking: Reported on 03/06/2018), Disp: 90 tablet, Rfl: 1 .  ondansetron (ZOFRAN) 8 MG tablet, Take 1 tablet (8 mg total) by mouth every 8 (eight) hours as needed for nausea or vomiting. (Patient not taking: Reported on 03/06/2018), Disp: 30 tablet, Rfl: 2 .  ondansetron (ZOFRAN-ODT) 8 MG disintegrating tablet, DISSOLVE 1 TABLET ON TONGUE EVERY 6 TO 8 HOURS AS NEEDED FOR NAUSEA OR VOMITING, Disp: , Rfl: 0 .  oxyCODONE-acetaminophen (PERCOCET/ROXICET) 5-325 MG tablet, Take 1-2 tablets by mouth every 4 (four) hours as needed for severe pain. (Patient not taking: Reported on 03/06/2018), Disp: 12 tablet, Rfl: 0 .  prochlorperazine (COMPAZINE) 10 MG tablet, Take 1 tablet (10 mg total) by mouth every 6 (six) hours as needed for nausea or vomiting. (Patient not taking: Reported on 03/06/2018), Disp: 30 tablet, Rfl: 2 No current facility-administered medications for this visit.   Facility-Administered Medications Ordered in Other Visits:  .  0.9 %  sodium chloride infusion, , Intravenous, Once, Finnegan, Kathlene November, MD .  heparin lock flush 100 unit/mL, 500 Units, Intravenous, Once,  Finnegan, Kathlene November, MD .  sodium chloride flush (NS) 0.9 % injection 10 mL, 10 mL, Intravenous, PRN, Lloyd Huger, MD, 10 mL at 08/16/16 0925 .  sodium chloride flush (NS) 0.9 % injection 10 mL, 10 mL, Intracatheter, PRN, Lloyd Huger, MD, 10 mL at 01/26/17 1343  Physical exam:  Vitals:   03/06/18 1450  BP: 132/81  Pulse: 87  Resp: 16  Temp: 97.8 F (36.6 C)  TempSrc: Tympanic  Weight: 155 lb (70.3  kg)   Physical Exam  Constitutional: She is oriented to person, place, and time. Vital signs are normal. She appears well-developed and well-nourished.  HENT:  Head: Normocephalic and atraumatic.  Eyes: Pupils are equal, round, and reactive to light.  Neck: Normal range of motion.  Cardiovascular: Normal rate, regular rhythm and normal heart sounds.  No murmur heard. Pulmonary/Chest: Effort normal and breath sounds normal. She has no wheezes.  Abdominal: Soft. Normal appearance and bowel sounds are normal. She exhibits no distension. There is no tenderness.  Musculoskeletal: Normal range of motion. She exhibits no edema.  Soft Tissue back: swollen and tight-warm to touch   Lymphadenopathy:    She has cervical adenopathy.       Right: Supraclavicular adenopathy present.       Left: Supraclavicular adenopathy present.  Neurological: She is alert and oriented to person, place, and time.  Skin: Skin is warm and dry. No rash noted.  Psychiatric: Judgment normal.     CMP Latest Ref Rng & Units 01/27/2018  Glucose 70 - 99 mg/dL 118(H)  BUN 6 - 20 mg/dL 14  Creatinine 0.44 - 1.00 mg/dL 0.65  Sodium 135 - 145 mmol/L 139  Potassium 3.5 - 5.1 mmol/L 3.4(L)  Chloride 98 - 111 mmol/L 106  CO2 22 - 32 mmol/L 25  Calcium 8.9 - 10.3 mg/dL 8.8(L)  Total Protein 6.5 - 8.1 g/dL 7.3  Total Bilirubin 0.3 - 1.2 mg/dL 0.6  Alkaline Phos 38 - 126 U/L 114  AST 15 - 41 U/L 16  ALT 0 - 44 U/L 12   CBC Latest Ref Rng & Units 01/27/2018  WBC 3.6 - 11.0 K/uL 20.2(H)  Hemoglobin 12.0 -  16.0 g/dL 11.5(L)  Hematocrit 35.0 - 47.0 % 34.4(L)  Platelets 150 - 440 K/uL 202    No images are attached to the encounter.  Ct Head W Wo Contrast  Result Date: 03/06/2018 CLINICAL DATA:  57 y/o F; swelling of the neck, face, shoulders, and eyes since last Wednesday. History of peritoneal carcinomatosis and chemotherapy. EXAM: CT HEAD WITHOUT AND WITH CONTRAST TECHNIQUE: Contiguous axial images were obtained from the base of the skull through the vertex without and with intravenous contrast CONTRAST:  105mL OMNIPAQUE IOHEXOL 300 MG/ML  SOLN COMPARISON:  Concurrent CT of the neck and cervical spine. 10/20/2016 PET-CT. FINDINGS: Brain: No evidence of acute infarction, hemorrhage, hydrocephalus, extra-axial collection or mass lesion/mass effect. After administration of intravenous contrast there is NO abnormal enhancement of the brain. Vascular: Mild calcific atherosclerosis of the carotid siphons. Normal enhancement of the central arterial and venous systems. Skull: Normal. Negative for fracture or focal lesion. Sinuses/Orbits: No acute finding. Other: None. IMPRESSION: Normal CT of the head with and without contrast. No intracranial metastasis identified. Electronically Signed   By: Kristine Garbe M.D.   On: 03/06/2018 16:55   Ct Soft Tissue Neck W Contrast  Result Date: 03/06/2018 CLINICAL DATA:  57 y/o F; swelling to the neck, face, shoulders, and eyes since last Wednesday. History of peritoneal carcinomatosis and chemotherapy. EXAM: CT NECK WITH CONTRAST TECHNIQUE: Multidetector CT imaging of the neck was performed using the standard protocol following the bolus administration of intravenous contrast. CONTRAST:  29mL OMNIPAQUE IOHEXOL 300 MG/ML  SOLN COMPARISON:  Concurrent CT head and CT chest.  10/20/2016 PET-CT. FINDINGS: Pharynx and larynx: Normal. No mass or swelling. Salivary glands: No inflammation, mass, or stone. Thyroid: Normal. Lymph nodes: None enlarged or abnormal density.  Vascular: Negative. Limited intracranial: Negative. Visualized orbits: Negative.  Mastoids and visualized paranasal sinuses: Clear. Skeleton: No acute or aggressive process. Upper chest: Please refer to the concurrent CT of the chest for further evaluation of the lungs and mediastinum. Other: Mild edema within the subcutaneous fat of the anterior neck and supraclavicular fossa. IMPRESSION: Mild edema within the subcutaneous fat of the anterior neck and supraclavicular fossa. No associated mass or fluid collection identified. Otherwise unremarkable CT of the neck. Electronically Signed   By: Kristine Garbe M.D.   On: 03/06/2018 16:46   Ct Chest W Contrast  Result Date: 03/06/2018 CLINICAL DATA:  Swelling to neck face and shoulders since last Wednesday, history of peritoneal carcinomatosis EXAM: CT CHEST WITH CONTRAST TECHNIQUE: Multidetector CT imaging of the chest was performed during intravenous contrast administration. Sagittal and coronal MPR images reconstructed from axial data set. CONTRAST:  48mL OMNIPAQUE IOHEXOL 300 MG/ML  SOLN IV. COMPARISON:  10/26/2017 FINDINGS: Cardiovascular: Aorta normal caliber. Thoracic vascular structures grossly patent on nondedicated exam. RIGHT jugular Port-A-Cath with tip in SVC near cavoatrial junction. Heart unremarkable. No pericardial effusion. Flow is identified within collaterals in the LEFT superior mediastinum to azygos system. SVC appears patent but does demonstrate an area of probable mild narrowing near the cavoatrial junction. Mediastinum/Nodes: Esophagus unremarkable. No thoracic adenopathy. Base of cervical region normal appearance. Lungs/Pleura: Mild dependent atelectasis in the posterior lower lobes. Lungs otherwise clear. No pulmonary infiltrate, pleural effusion or pneumothorax. 3 mm LEFT upper lobe nodule image 34, better visualized on previous study. Upper Abdomen: Significant ascites in the upper abdomen. Omental thickening/infiltration.  Musculoskeletal: No acute osseous findings. IMPRESSION: Ascites and omental thickening/infiltration in the upper abdomen again identified. Relative narrowing of the distal SVC near cavoatrial junction though appears to remain patent. No other intrathoracic abnormalities. Electronically Signed   By: Lavonia Dana M.D.   On: 03/06/2018 16:39     Assessment and plan- Patient is a 57 y.o. female who presents for facial, neck and shoulder swelling.  1.  Peritoneal carcinomatosis: Currently awaiting second opinion by [facility] oncology. S/p reinitiating of FOLFIRI last given on 01/23/2017.  Developed nausea, vomiting and diarrhea and was admitted to the hospital for 3 to 4 days for hydration and electrolyte replacement.  Decided to not proceed with any additional chemotherapy until she is seen by [facility]. RTC in 03/08/18 for further discussion and treatment options.   2.  Facial, neck and shoulder swelling: Patient recently returned from the beach and was exposed to several new environmental agents including body wash, face cream, seafood and sun exposure.  Had Botox injections approximately 2 weeks ago to forehead.  Has Botox several times per year and has never had a reaction.  Started on a Medrol Dosepak d/t worsening swelling in face and chest on Saturday which has not improved her symptoms.  Cervical swelling/fluid present.  Consulted with Dr. Grayland Ormond. It is  recommended to get imaging.  Will get stat CT head, neck and chest for evaluation. Results: CT soft tissue neck with contrast showed mild edema within the subcutaneous fat of the anterior neck and subclavicular fossa.  No mass was identified.  CT chest revealed relative narrowing of the distal SVC and ascites and omental thickening in the upper abdomen that was unchanged.  CT of head was unremarkable.  Patient notified of imaging results.  Unfortunately there is no identifying reason for her symptoms.  We will add her onto tumor board for 03/08/2018.  Dr. Grayland Ormond and  patient in agreement with plan.    Visit Diagnosis 1. Swelling  2. Peritoneal carcinomatosis [facility])     Patient expressed understanding and was in agreement with this plan. She also understands that She can call clinic at any time with any questions, concerns, or complaints.   Greater than 50% was spent in counseling and coordination of care with this patient including but not limited to discussion of the relevant topics above (See A&P) including, but not limited to diagnosis and management of acute and chronic medical conditions.    Faythe Casa, AGNP-C [facility]- [phone] Pager- [phone] 03/07/2018 3:09 PM

## 2018-03-07 ENCOUNTER — Encounter: Payer: Self-pay | Admitting: Oncology

## 2018-03-07 ENCOUNTER — Other Ambulatory Visit: Payer: Self-pay | Admitting: Oncology

## 2018-03-08 ENCOUNTER — Other Ambulatory Visit: Payer: Self-pay

## 2018-03-08 ENCOUNTER — Inpatient Hospital Stay (HOSPITAL_BASED_OUTPATIENT_CLINIC_OR_DEPARTMENT_OTHER): Payer: BLUE CROSS/BLUE SHIELD | Admitting: Oncology

## 2018-03-08 VITALS — BP 122/70 | HR 92 | Temp 96.6°F | Resp 18 | Wt 157.6 lb

## 2018-03-08 DIAGNOSIS — C786 Secondary malignant neoplasm of retroperitoneum and peritoneum: Secondary | ICD-10-CM

## 2018-03-08 DIAGNOSIS — R600 Localized edema: Secondary | ICD-10-CM

## 2018-03-08 DIAGNOSIS — Z79899 Other long term (current) drug therapy: Secondary | ICD-10-CM

## 2018-03-08 DIAGNOSIS — F419 Anxiety disorder, unspecified: Secondary | ICD-10-CM

## 2018-03-08 DIAGNOSIS — M1990 Unspecified osteoarthritis, unspecified site: Secondary | ICD-10-CM

## 2018-03-08 DIAGNOSIS — R785 Finding of other psychotropic drug in blood: Secondary | ICD-10-CM

## 2018-03-08 DIAGNOSIS — K219 Gastro-esophageal reflux disease without esophagitis: Secondary | ICD-10-CM

## 2018-03-08 DIAGNOSIS — C801 Malignant (primary) neoplasm, unspecified: Secondary | ICD-10-CM | POA: Diagnosis not present

## 2018-03-08 DIAGNOSIS — Z87891 Personal history of nicotine dependence: Secondary | ICD-10-CM

## 2018-03-08 NOTE — Progress Notes (Signed)
Here for repeat visit w edema she stated of face, shoulder,chest -stated it was worse in am. Stated last eve diff swallowing  "not a tight feeling "  But some difficulty.

## 2018-03-09 ENCOUNTER — Encounter: Payer: Self-pay | Admitting: Oncology

## 2018-03-11 ENCOUNTER — Encounter: Payer: Self-pay | Admitting: Oncology

## 2018-03-12 ENCOUNTER — Encounter: Payer: Self-pay | Admitting: Oncology

## 2018-03-14 ENCOUNTER — Encounter: Payer: Self-pay | Admitting: Oncology

## 2018-03-26 ENCOUNTER — Encounter: Payer: Self-pay | Admitting: *Deleted

## 2018-03-26 ENCOUNTER — Encounter: Payer: Self-pay | Admitting: Oncology

## 2018-03-27 ENCOUNTER — Ambulatory Visit
Admission: RE | Admit: 2018-03-27 | Discharge: 2018-03-27 | Disposition: A | Discharge status: Home / Self Care | Payer: BLUE CROSS/BLUE SHIELD | Source: Ambulatory Visit | Attending: Oncology | Admitting: Oncology

## 2018-03-27 DIAGNOSIS — R188 Other ascites: Secondary | ICD-10-CM | POA: Insufficient documentation

## 2018-03-27 NOTE — Procedures (Signed)
PROCEDURE SUMMARY:  Successful US guided paracentesis from right lateral abdomen.  Yielded 2.9 liters of dark, amber fluid.  No immediate complications.  Pt tolerated well.   Specimen was not sent for labs.  Docia Barrier PA-C 03/27/2018 8:58 AM

## 2018-03-29 ENCOUNTER — Encounter: Payer: Self-pay | Admitting: Oncology

## 2018-03-30 ENCOUNTER — Inpatient Hospital Stay: Payer: BLUE CROSS/BLUE SHIELD | Admitting: Oncology

## 2018-03-30 ENCOUNTER — Encounter: Payer: Self-pay | Admitting: Oncology

## 2018-03-30 VITALS — BP 104/71 | HR 78 | Temp 97.6°F | Resp 18 | Wt 145.5 lb

## 2018-03-30 DIAGNOSIS — C801 Malignant (primary) neoplasm, unspecified: Secondary | ICD-10-CM

## 2018-03-30 DIAGNOSIS — C786 Secondary malignant neoplasm of retroperitoneum and peritoneum: Secondary | ICD-10-CM | POA: Diagnosis not present

## 2018-03-30 DIAGNOSIS — R109 Unspecified abdominal pain: Secondary | ICD-10-CM | POA: Diagnosis not present

## 2018-03-30 DIAGNOSIS — K219 Gastro-esophageal reflux disease without esophagitis: Secondary | ICD-10-CM

## 2018-03-30 DIAGNOSIS — R197 Diarrhea, unspecified: Secondary | ICD-10-CM

## 2018-03-30 DIAGNOSIS — E785 Hyperlipidemia, unspecified: Secondary | ICD-10-CM

## 2018-03-30 DIAGNOSIS — R188 Other ascites: Secondary | ICD-10-CM | POA: Diagnosis not present

## 2018-03-30 DIAGNOSIS — Z79899 Other long term (current) drug therapy: Secondary | ICD-10-CM

## 2018-03-30 DIAGNOSIS — Z87891 Personal history of nicotine dependence: Secondary | ICD-10-CM

## 2018-03-30 DIAGNOSIS — M1990 Unspecified osteoarthritis, unspecified site: Secondary | ICD-10-CM

## 2018-03-30 DIAGNOSIS — R110 Nausea: Secondary | ICD-10-CM

## 2018-03-30 DIAGNOSIS — F419 Anxiety disorder, unspecified: Secondary | ICD-10-CM

## 2018-03-30 NOTE — Progress Notes (Signed)
Pt has been declining since beginning of September per pt and husband.  Seen at [facility] earlier this week.  Reports unable to eat due to significant abdominal pain when eats and issues with diarrhea.

## 2018-03-30 NOTE — Telephone Encounter (Signed)
I called and spoke with pt's husband. He will be bringing her to see Dr. Grayland Ormond @ the [facility] location this morning @ 10:30.

## 2018-03-30 NOTE — Progress Notes (Signed)
[place]  [phone]) [phone] [phone]) [phone]  ID: Valerie Horton OB: Feb 15, 1961  MR#: [id number]  CSN#:[id number]  Patient Care Team: Valerie Roys, DO as PCP - General (Family Medicine) Clent Jacks, RN as Registered Nurse Garnetta Buddy, MD as Consulting Physician (Internal Medicine)  CHIEF COMPLAINT: Peritoneal carcinomatosis, metastatic adenocarcinoma of likely lower GI primary.  INTERVAL HISTORY: Patient returns to clinic today as an add-on for evaluation and treatment planning.  She initially was going to be enrolled in clinical trial at [facility], but because of progression of disease it was thought she is progressing too rapidly to wait for enrollment.  She continues to have abdominal pain, nausea, and bloating.  She also has intermittent diarrhea.  She has no neurologic complaints.  She has no further facial swelling.  She denies any chest pain or shortness of breath.  She has no urinary complaints.  Patient generally feels terrible, but offers no further specific complaints today.    REVIEW OF SYSTEMS:   Review of Systems  Constitutional: Negative.  Negative for fever, malaise/fatigue and weight loss.  Respiratory: Negative.  Negative for cough and shortness of breath.   Cardiovascular: Negative.  Negative for chest pain and leg swelling.  Gastrointestinal: Positive for abdominal pain, diarrhea and nausea. Negative for blood in stool, constipation, melena and vomiting.  Genitourinary: Negative.  Negative for dysuria.  Musculoskeletal: Negative.  Negative for back pain.  Skin: Negative.  Negative for rash.  Neurological: Negative.  Negative for sensory change, focal weakness, weakness and headaches.  Psychiatric/Behavioral: The patient is nervous/anxious.     As per HPI. Otherwise, a complete review of systems is negative.  PAST MEDICAL HISTORY: Past Medical History:  Diagnosis Date  . Acid reflux   . Allergic rhinitis   . Anxiety   .  Arthritis    left knee  . Cervical spondylosis   . CTS (carpal tunnel syndrome)    Right  . Diverticulosis   . HSV-2 (herpes simplex virus 2) infection   . Hyperlipidemia   . Ovarian failure   . Peritoneal carcinomatosis ([place]) 01/2016   chemo tx's.   . Rosacea   . Wears contact lenses     PAST SURGICAL HISTORY: Past Surgical History:  Procedure Laterality Date  . ANTERIOR CRUCIATE LIGAMENT REPAIR Left   . CESAREAN SECTION    . COLONOSCOPY WITH PROPOFOL N/A 01/29/2016   Procedure: COLONOSCOPY WITH PROPOFOL;  Surgeon: Lucilla Lame, MD;  Location: [place];  Service: Endoscopy;  Laterality: N/A;  . COLONOSCOPY WITH PROPOFOL N/A 01/01/2018   Procedure: COLONOSCOPY WITH PROPOFOL;  Surgeon: Lucilla Lame, MD;  Location: [place];  Service: Endoscopy;  Laterality: N/A;  . ESOPHAGOGASTRODUODENOSCOPY (EGD) WITH PROPOFOL N/A 01/29/2016   Procedure: ESOPHAGOGASTRODUODENOSCOPY (EGD) WITH PROPOFOL;  Surgeon: Lucilla Lame, MD;  Location: [place];  Service: Endoscopy;  Laterality: N/A;  . ESOPHAGOGASTRODUODENOSCOPY (EGD) WITH PROPOFOL N/A 01/01/2018   Procedure: ESOPHAGOGASTRODUODENOSCOPY (EGD) WITH PROPOFOL;  Surgeon: Lucilla Lame, MD;  Location: [place];  Service: Endoscopy;  Laterality: N/A;  . PERIPHERAL VASCULAR CATHETERIZATION N/A 02/10/2016   Procedure: Glori Luis Cath Insertion;  Surgeon: Algernon Huxley, MD;  Location: [place] [facility];  Service: Cardiovascular;  Laterality: N/A;    FAMILY HISTORY: Unknown.  Patient is adopted.     ADVANCED DIRECTIVES:    HEALTH MAINTENANCE: Social History   Tobacco Use  . Smoking status: Former Research scientist (life sciences)  . Smokeless tobacco: Never Used  . Tobacco comment: Quit in  her 20's  Substance Use Topics  . Alcohol use: Yes    Alcohol/week: 0.0 standard drinks    Comment: 1 drink/mo  . Drug use: No     Colonoscopy:  PAP:  Bone density:  Lipid panel:  No Known Allergies  Current Outpatient Medications  Medication  Sig Dispense Refill  . escitalopram (LEXAPRO) 10 MG tablet TAKE 1 TABLET BY MOUTH EVERY DAY 90 tablet 1  . lidocaine-prilocaine (EMLA) cream Apply 1 application topically as needed. 30 g 3  . pantoprazole (PROTONIX) 40 MG tablet TAKE 1 TABLET BY MOUTH EVERY DAY 30 tablet 5  . acyclovir (ZOVIRAX) 800 MG tablet TAKE 1 TABLET BY MOUTH EVERY DAY (Patient not taking: Reported on 03/30/2018) 90 tablet 1  . Cholecalciferol 1000 units capsule Take 1,000 Units by mouth daily.     . diphenhydrAMINE (BENADRYL) 25 MG tablet Take 25 mg by mouth every 6 (six) hours as needed.    . fluticasone (FLONASE) 50 MCG/ACT nasal spray Place into both nostrils daily.    Marland Kitchen gabapentin (NEURONTIN) 300 MG capsule Take 300 mg by mouth at bedtime.    Marland Kitchen HYDROcodone-acetaminophen (NORCO/VICODIN) 5-325 MG tablet TAKE 1 TABLET BY MOUTH EVERY 4 TO 6 HOURS AS NEEDED FOR PAIN WITH FOOD  0  . meloxicam (MOBIC) 15 MG tablet Mobic 15 mg tablet  Take 1 tablet every day by oral route with meals.    . methocarbamol (ROBAXIN) 500 MG tablet Take 500 mg by mouth 2 (two) times daily.  0  . naproxen (NAPROSYN) 500 MG tablet Take 1 tablet (500 mg total) by mouth 2 (two) times daily with a meal. (Patient not taking: Reported on 03/30/2018) 90 tablet 1  . ondansetron (ZOFRAN) 8 MG tablet Take 1 tablet (8 mg total) by mouth every 8 (eight) hours as needed for nausea or vomiting. (Patient not taking: Reported on 03/08/2018) 30 tablet 2  . ondansetron (ZOFRAN-ODT) 8 MG disintegrating tablet DISSOLVE 1 TABLET ON TONGUE EVERY 6 TO 8 HOURS AS NEEDED FOR NAUSEA OR VOMITING  0  . oxyCODONE-acetaminophen (PERCOCET/ROXICET) 5-325 MG tablet Take 1-2 tablets by mouth every 4 (four) hours as needed for severe pain. (Patient not taking: Reported on 03/08/2018) 12 tablet 0  . polyethylene glycol (MIRALAX) packet Take 17 g by mouth daily. (Patient not taking: Reported on 03/08/2018) 14 each 0  . prochlorperazine (COMPAZINE) 10 MG tablet Take 1 tablet (10 mg total) by mouth  every 6 (six) hours as needed for nausea or vomiting. (Patient not taking: Reported on 03/08/2018) 30 tablet 2   No current facility-administered medications for this visit.    Facility-Administered Medications Ordered in Other Visits  Medication Dose Route Frequency Provider Last Rate Last Dose  . 0.9 %  sodium chloride infusion   Intravenous Once Lloyd Huger, MD      . heparin lock flush 100 unit/mL  500 Units Intravenous Once Lloyd Huger, MD      . sodium chloride flush (NS) 0.9 % injection 10 mL  10 mL Intravenous PRN Lloyd Huger, MD   10 mL at 08/16/16 0925    OBJECTIVE: Vitals:   03/30/18 1122  BP: 104/71  Pulse: 78  Resp: 18  Temp: 97.6 F (36.4 C)     Body mass index is 25.77 kg/m.    ECOG FS:0 - Asymptomatic  General: Well-developed, well-nourished, no acute distress. Eyes: Pink conjunctiva, anicteric sclera. HEENT: Normocephalic, moist mucous membranes, clear oropharnyx.  Facial swelling resolved. Lungs: Clear to auscultation  bilaterally. Heart: Regular rate and rhythm. No rubs, murmurs, or gallops. Abdomen: Mildly distended, mildly tender. Musculoskeletal: No edema, cyanosis, or clubbing. Neuro: Alert, answering all questions appropriately. Cranial nerves grossly intact. Skin: No rashes or petechiae noted. Psych: Normal affect.  LAB RESULTS:  Lab Results  Component Value Date   NA 139 01/27/2018   K 3.4 (L) 01/27/2018   CL 106 01/27/2018   CO2 25 01/27/2018   GLUCOSE 118 (H) 01/27/2018   BUN 14 01/27/2018   CREATININE 0.65 01/27/2018   CALCIUM 8.8 (L) 01/27/2018   PROT 7.3 01/27/2018   ALBUMIN 3.9 01/27/2018   AST 16 01/27/2018   ALT 12 01/27/2018   ALKPHOS 114 01/27/2018   BILITOT 0.6 01/27/2018   GFRNONAA >60 01/27/2018   GFRAA >60 01/27/2018    Lab Results  Component Value Date   WBC 20.2 (H) 01/27/2018   NEUTROABS 17.8 (H) 01/27/2018   HGB 11.5 (L) 01/27/2018   HCT 34.4 (L) 01/27/2018   MCV 82.3 01/27/2018   PLT 202  01/27/2018     STUDIES: Ct Head W Wo Contrast  Result Date: 03/06/2018 CLINICAL DATA:  57 y/o F; swelling of the neck, face, shoulders, and eyes since last Wednesday. History of peritoneal carcinomatosis and chemotherapy. EXAM: CT HEAD WITHOUT AND WITH CONTRAST TECHNIQUE: Contiguous axial images were obtained from the base of the skull through the vertex without and with intravenous contrast CONTRAST:  57mL OMNIPAQUE IOHEXOL 300 MG/ML  SOLN COMPARISON:  Concurrent CT of the neck and cervical spine. 10/20/2016 PET-CT. FINDINGS: Brain: No evidence of acute infarction, hemorrhage, hydrocephalus, extra-axial collection or mass lesion/mass effect. After administration of intravenous contrast there is NO abnormal enhancement of the brain. Vascular: Mild calcific atherosclerosis of the carotid siphons. Normal enhancement of the central arterial and venous systems. Skull: Normal. Negative for fracture or focal lesion. Sinuses/Orbits: No acute finding. Other: None. IMPRESSION: Normal CT of the head with and without contrast. No intracranial metastasis identified. Electronically Signed   By: Kristine Garbe M.D.   On: 03/06/2018 16:55   Ct Soft Tissue Neck W Contrast  Result Date: 03/06/2018 CLINICAL DATA:  57 y/o F; swelling to the neck, face, shoulders, and eyes since last Wednesday. History of peritoneal carcinomatosis and chemotherapy. EXAM: CT NECK WITH CONTRAST TECHNIQUE: Multidetector CT imaging of the neck was performed using the standard protocol following the bolus administration of intravenous contrast. CONTRAST:  57mL OMNIPAQUE IOHEXOL 300 MG/ML  SOLN COMPARISON:  Concurrent CT head and CT chest.  10/20/2016 PET-CT. FINDINGS: Pharynx and larynx: Normal. No mass or swelling. Salivary glands: No inflammation, mass, or stone. Thyroid: Normal. Lymph nodes: None enlarged or abnormal density. Vascular: Negative. Limited intracranial: Negative. Visualized orbits: Negative. Mastoids and visualized  paranasal sinuses: Clear. Skeleton: No acute or aggressive process. Upper chest: Please refer to the concurrent CT of the chest for further evaluation of the lungs and mediastinum. Other: Mild edema within the subcutaneous fat of the anterior neck and supraclavicular fossa. IMPRESSION: Mild edema within the subcutaneous fat of the anterior neck and supraclavicular fossa. No associated mass or fluid collection identified. Otherwise unremarkable CT of the neck. Electronically Signed   By: Kristine Garbe M.D.   On: 03/06/2018 16:46   Ct Chest W Contrast  Result Date: 03/06/2018 CLINICAL DATA:  Swelling to neck face and shoulders since last Wednesday, history of peritoneal carcinomatosis EXAM: CT CHEST WITH CONTRAST TECHNIQUE: Multidetector CT imaging of the chest was performed during intravenous contrast administration. Sagittal and coronal MPR images reconstructed  from axial data set. CONTRAST:  60mL OMNIPAQUE IOHEXOL 300 MG/ML  SOLN IV. COMPARISON:  10/26/2017 FINDINGS: Cardiovascular: Aorta normal caliber. Thoracic vascular structures grossly patent on nondedicated exam. RIGHT jugular Port-A-Cath with tip in SVC near cavoatrial junction. Heart unremarkable. No pericardial effusion. Flow is identified within collaterals in the LEFT superior mediastinum to azygos system. SVC appears patent but does demonstrate an area of probable mild narrowing near the cavoatrial junction. Mediastinum/Nodes: Esophagus unremarkable. No thoracic adenopathy. Base of cervical region normal appearance. Lungs/Pleura: Mild dependent atelectasis in the posterior lower lobes. Lungs otherwise clear. No pulmonary infiltrate, pleural effusion or pneumothorax. 3 mm LEFT upper lobe nodule image 34, better visualized on previous study. Upper Abdomen: Significant ascites in the upper abdomen. Omental thickening/infiltration. Musculoskeletal: No acute osseous findings. IMPRESSION: Ascites and omental thickening/infiltration in the upper  abdomen again identified. Relative narrowing of the distal SVC near cavoatrial junction though appears to remain patent. No other intrathoracic abnormalities. Electronically Signed   By: Lavonia Dana M.D.   On: 03/06/2018 16:39   US Paracentesis  Result Date: 03/27/2018 INDICATION: Patient with history of peritoneal carcinomatosis, abdominal distention, recurrent ascites. Request is made for therapeutic paracentesis. EXAM: ULTRASOUND GUIDED tHERAPEUTIC PARACENTESIS MEDICATIONS: 10 mL 1% lidocaine COMPLICATIONS: None immediate. PROCEDURE: Informed written consent was obtained from the patient after a discussion of the risks, benefits and alternatives to treatment. A timeout was performed prior to the initiation of the procedure. Initial ultrasound scanning demonstrates a moderate amount of ascites within the left lateral abdomen. The left lateral abdomen was prepped and draped in the usual sterile fashion. 1% lidocaine was used for local anesthesia. Following this, a 19 gauge, 7-cm, Yueh catheter was introduced. An ultrasound image was saved for documentation purposes. The paracentesis was performed. The catheter was removed and a dressing was applied. The patient tolerated the procedure well without immediate post procedural complication. FINDINGS: A total of approximately 2.9 liters of dark, amber fluid was removed. IMPRESSION: Successful ultrasound-guided therapeutic paracentesis yielding 2.9 liters of peritoneal fluid. Read by: Brynda Greathouse PA-C Electronically Signed   By: Marybelle Killings M.D.   On: 03/27/2018 09:19    ASSESSMENT: Peritoneal carcinomatosis, metastatic adenocarcinoma of likely lower GI primary.  PLAN:    1. Peritoneal carcinomatosis: Patient had recent paracentesis to remove 2.9 L of fluid.  Given this and her worsening symptoms, she likely has progressive disease.  Rather than enrolling on clinical trial at [facility], patient will initiate palliative chemotherapy using Irinotecan and  Panitumumab only.  Patient will receive treatment every 2 weeks and will reimage in approximately 3 months.  Return to clinic on Monday, April 02, 2018 for further evaluation and initiation of cycle 1.  Previously, Foundation 1 testing did not reveal any actionable mutations.  Patient will also benefit from palliative care consultation in the near future. 2.  Ascites: Paracentesis on March 27, 2018 reviewed 2.9 L of fluid.  Likely secondary to progressive disease. 3.  Facial swelling: Unclear etiology, but most likely related to Botox injections.  Resolved. 4.  Pain: Patient was instructed to take Percocet as needed.    I spent a total of 30 minutes face-to-face with the patient of which greater than 50% of the visit was spent in counseling and coordination of care as detailed above.    Patient expressed understanding and was in agreement with this plan. She also understands that She can call clinic at any time with any questions, concerns, or complaints.     Lloyd Huger, MD  03/30/18 3:30 PM

## 2018-03-30 NOTE — Progress Notes (Signed)
DISCONTINUE ON PATHWAY REGIMEN - Colorectal     A cycle is every 14 days:     Irinotecan        Dose Mod: None     Leucovorin        Dose Mod: None     5-Fluorouracil        Dose Mod: None     5-Fluorouracil        Dose Mod: None  **Always confirm dose/schedule in your pharmacy ordering system**  REASON: Disease Progression PRIOR TREATMENT: [id number]: FOLFIRI TREATMENT RESPONSE: Progressive Disease (PD)  START ON PATHWAY REGIMEN - Colorectal     A cycle is every 14 days:     Panitumumab      Irinotecan   **Always confirm dose/schedule in your pharmacy ordering system**  Patient Characteristics: Metastatic Colorectal, Third Line, KRAS/NRAS Wild-Type, BRAF Wild-Type/Unknown, No Prior Anti-EGFR Therapy Current evidence of distant metastases<= Yes AJCC T Category: TX AJCC N Category: NX AJCC M Category: M1c AJCC 8 Stage Grouping: IVC BRAF Mutation Status: Wild-Type (no mutation) KRAS/NRAS Mutation Status: Wild-Type (no mutation) Line of therapy: Third Line  Intent of Therapy: Non-Curative / Palliative Intent, Discussed with Patient

## 2018-04-01 NOTE — Progress Notes (Signed)
[place]  [phone]) [phone] [phone]) [phone]  ID: Valerie Horton OB: May 26, 1961  MR#: [id number]  IPJ#:[id number]  Patient Care Team: Valerie Roys, DO as PCP - General (Family Medicine) Clent Jacks, RN as Registered Nurse Garnetta Buddy, MD as Consulting Physician (Internal Medicine)  CHIEF COMPLAINT: Peritoneal carcinomatosis, metastatic adenocarcinoma of likely lower GI primary.  INTERVAL HISTORY: Patient returns to clinic today for further evaluation and initiation of irinotecan and Panitumumab.  She continues to have loose stools and diarrhea as well as intermittent abdominal pain.  She continues to have nausea and increased bloating.  She has no neurologic complaints.  She has no further facial swelling.  She denies any chest pain or shortness of breath.  She has no urinary complaints.  Patient offers no further specific complaints today.  REVIEW OF SYSTEMS:   Review of Systems  Constitutional: Negative.  Negative for fever, malaise/fatigue and weight loss.  Respiratory: Negative.  Negative for cough and shortness of breath.   Cardiovascular: Negative.  Negative for chest pain and leg swelling.  Gastrointestinal: Positive for abdominal pain, diarrhea and nausea. Negative for blood in stool, constipation, melena and vomiting.  Genitourinary: Negative.  Negative for dysuria.  Musculoskeletal: Negative.  Negative for back pain.  Skin: Negative.  Negative for rash.  Neurological: Negative.  Negative for sensory change, focal weakness, weakness and headaches.  Psychiatric/Behavioral: The patient is nervous/anxious.     As per HPI. Otherwise, a complete review of systems is negative.  PAST MEDICAL HISTORY: Past Medical History:  Diagnosis Date  . Acid reflux   . Allergic rhinitis   . Anxiety   . Arthritis    left knee  . Cervical spondylosis   . CTS (carpal tunnel syndrome)    Right  . Diverticulosis   . HSV-2 (herpes simplex virus 2)  infection   . Hyperlipidemia   . Ovarian failure   . Peritoneal carcinomatosis ([place]) 01/2016   chemo tx's.   . Rosacea   . Wears contact lenses     PAST SURGICAL HISTORY: Past Surgical History:  Procedure Laterality Date  . ANTERIOR CRUCIATE LIGAMENT REPAIR Left   . CESAREAN SECTION    . COLONOSCOPY WITH PROPOFOL N/A 01/29/2016   Procedure: COLONOSCOPY WITH PROPOFOL;  Surgeon: Lucilla Lame, MD;  Location: [facility];  Service: Endoscopy;  Laterality: N/A;  . COLONOSCOPY WITH PROPOFOL N/A 01/01/2018   Procedure: COLONOSCOPY WITH PROPOFOL;  Surgeon: Lucilla Lame, MD;  Location: [facility];  Service: Endoscopy;  Laterality: N/A;  . ESOPHAGOGASTRODUODENOSCOPY (EGD) WITH PROPOFOL N/A 01/29/2016   Procedure: ESOPHAGOGASTRODUODENOSCOPY (EGD) WITH PROPOFOL;  Surgeon: Lucilla Lame, MD;  Location: [facility];  Service: Endoscopy;  Laterality: N/A;  . ESOPHAGOGASTRODUODENOSCOPY (EGD) WITH PROPOFOL N/A 01/01/2018   Procedure: ESOPHAGOGASTRODUODENOSCOPY (EGD) WITH PROPOFOL;  Surgeon: Lucilla Lame, MD;  Location: [facility];  Service: Endoscopy;  Laterality: N/A;  . PERIPHERAL VASCULAR CATHETERIZATION N/A 02/10/2016   Procedure: Glori Luis Cath Insertion;  Surgeon: Algernon Huxley, MD;  Location: [facility];  Service: Cardiovascular;  Laterality: N/A;    FAMILY HISTORY: Unknown.  Patient is adopted.     ADVANCED DIRECTIVES:    HEALTH MAINTENANCE: Social History   Tobacco Use  . Smoking status: Former Research scientist (life sciences)  . Smokeless tobacco: Never Used  . Tobacco comment: Quit in her 40's  Substance Use Topics  . Alcohol use: Yes    Alcohol/week: 0.0 standard drinks    Comment: 1 drink/mo  . Drug use:  No     Colonoscopy:  PAP:  Bone density:  Lipid panel:  No Known Allergies  Current Outpatient Medications  Medication Sig Dispense Refill  . Cholecalciferol 1000 units capsule Take 1,000 Units by mouth daily.     . diphenhydrAMINE (BENADRYL) 25 MG tablet Take 25  mg by mouth every 6 (six) hours as needed.    Marland Kitchen escitalopram (LEXAPRO) 10 MG tablet TAKE 1 TABLET BY MOUTH EVERY DAY 90 tablet 1  . fluticasone (FLONASE) 50 MCG/ACT nasal spray Place into both nostrils daily.    Marland Kitchen gabapentin (NEURONTIN) 300 MG capsule Take 300 mg by mouth at bedtime.    Marland Kitchen HYDROcodone-acetaminophen (NORCO/VICODIN) 5-325 MG tablet TAKE 1 TABLET BY MOUTH EVERY 4 TO 6 HOURS AS NEEDED FOR PAIN WITH FOOD  0  . lidocaine-prilocaine (EMLA) cream Apply 1 application topically as needed. 30 g 3  . meloxicam (MOBIC) 15 MG tablet Mobic 15 mg tablet  Take 1 tablet every day by oral route with meals.    . methocarbamol (ROBAXIN) 500 MG tablet Take 500 mg by mouth 2 (two) times daily.  0  . pantoprazole (PROTONIX) 40 MG tablet TAKE 1 TABLET BY MOUTH EVERY DAY 30 tablet 5  . acyclovir (ZOVIRAX) 800 MG tablet TAKE 1 TABLET BY MOUTH EVERY DAY (Patient not taking: Reported on 03/30/2018) 90 tablet 1  . naproxen (NAPROSYN) 500 MG tablet Take 1 tablet (500 mg total) by mouth 2 (two) times daily with a meal. (Patient not taking: Reported on 03/30/2018) 90 tablet 1  . ondansetron (ZOFRAN) 8 MG tablet Take 1 tablet (8 mg total) by mouth every 8 (eight) hours as needed for nausea or vomiting. (Patient not taking: Reported on 03/08/2018) 30 tablet 2  . ondansetron (ZOFRAN-ODT) 8 MG disintegrating tablet DISSOLVE 1 TABLET ON TONGUE EVERY 6 TO 8 HOURS AS NEEDED FOR NAUSEA OR VOMITING  0  . oxyCODONE-acetaminophen (PERCOCET/ROXICET) 5-325 MG tablet Take 1-2 tablets by mouth every 4 (four) hours as needed for severe pain. (Patient not taking: Reported on 03/08/2018) 12 tablet 0  . polyethylene glycol (MIRALAX) packet Take 17 g by mouth daily. (Patient not taking: Reported on 03/08/2018) 14 each 0  . prochlorperazine (COMPAZINE) 10 MG tablet Take 1 tablet (10 mg total) by mouth every 6 (six) hours as needed for nausea or vomiting. (Patient not taking: Reported on 03/08/2018) 30 tablet 2   No current facility-administered  medications for this visit.    Facility-Administered Medications Ordered in Other Visits  Medication Dose Route Frequency Provider Last Rate Last Dose  . 0.9 %  sodium chloride infusion   Intravenous Once Lloyd Huger, MD      . heparin lock flush 100 unit/mL  500 Units Intravenous Once Lloyd Huger, MD      . sodium chloride flush (NS) 0.9 % injection 10 mL  10 mL Intravenous PRN Lloyd Huger, MD   10 mL at 08/16/16 0925    OBJECTIVE: Vitals:   04/02/18 1019  BP: 111/68  Pulse: 95  Resp: 18  Temp: (!) 97.2 F (36.2 C)     Body mass index is 26.08 kg/m.    ECOG FS:1 - Symptomatic but completely ambulatory  General: Well-developed, well-nourished, no acute distress. Eyes: Pink conjunctiva, anicteric sclera. HEENT: Normocephalic, moist mucous membranes. Lungs: Clear to auscultation bilaterally. Heart: Regular rate and rhythm. No rubs, murmurs, or gallops. Abdomen: Mildly distended, normoactive bowel sounds. Musculoskeletal: No edema, cyanosis, or clubbing. Neuro: Alert, answering all questions appropriately. Cranial  nerves grossly intact. Skin: No rashes or petechiae noted. Psych: Normal affect.  LAB RESULTS:  Lab Results  Component Value Date   NA 138 04/02/2018   K 3.6 04/02/2018   CL 103 04/02/2018   CO2 26 04/02/2018   GLUCOSE 110 (H) 04/02/2018   BUN 12 04/02/2018   CREATININE 0.71 04/02/2018   CALCIUM 9.1 04/02/2018   PROT 6.9 04/02/2018   ALBUMIN 3.6 04/02/2018   AST 18 04/02/2018   ALT 11 04/02/2018   ALKPHOS 100 04/02/2018   BILITOT 0.5 04/02/2018   GFRNONAA >60 04/02/2018   GFRAA >60 04/02/2018    Lab Results  Component Value Date   WBC 5.3 04/02/2018   NEUTROABS 3.4 04/02/2018   HGB 12.4 04/02/2018   HCT 37.6 04/02/2018   MCV 81.8 04/02/2018   PLT 234 04/02/2018     STUDIES: Ct Head W Wo Contrast  Result Date: 03/06/2018 CLINICAL DATA:  57 y/o F; swelling of the neck, face, shoulders, and eyes since last Wednesday.  History of peritoneal carcinomatosis and chemotherapy. EXAM: CT HEAD WITHOUT AND WITH CONTRAST TECHNIQUE: Contiguous axial images were obtained from the base of the skull through the vertex without and with intravenous contrast CONTRAST:  42mL OMNIPAQUE IOHEXOL 300 MG/ML  SOLN COMPARISON:  Concurrent CT of the neck and cervical spine. 10/20/2016 PET-CT. FINDINGS: Brain: No evidence of acute infarction, hemorrhage, hydrocephalus, extra-axial collection or mass lesion/mass effect. After administration of intravenous contrast there is NO abnormal enhancement of the brain. Vascular: Mild calcific atherosclerosis of the carotid siphons. Normal enhancement of the central arterial and venous systems. Skull: Normal. Negative for fracture or focal lesion. Sinuses/Orbits: No acute finding. Other: None. IMPRESSION: Normal CT of the head with and without contrast. No intracranial metastasis identified. Electronically Signed   By: Kristine Garbe M.D.   On: 03/06/2018 16:55   Ct Soft Tissue Neck W Contrast  Result Date: 03/06/2018 CLINICAL DATA:  57 y/o F; swelling to the neck, face, shoulders, and eyes since last Wednesday. History of peritoneal carcinomatosis and chemotherapy. EXAM: CT NECK WITH CONTRAST TECHNIQUE: Multidetector CT imaging of the neck was performed using the standard protocol following the bolus administration of intravenous contrast. CONTRAST:  91mL OMNIPAQUE IOHEXOL 300 MG/ML  SOLN COMPARISON:  Concurrent CT head and CT chest.  10/20/2016 PET-CT. FINDINGS: Pharynx and larynx: Normal. No mass or swelling. Salivary glands: No inflammation, mass, or stone. Thyroid: Normal. Lymph nodes: None enlarged or abnormal density. Vascular: Negative. Limited intracranial: Negative. Visualized orbits: Negative. Mastoids and visualized paranasal sinuses: Clear. Skeleton: No acute or aggressive process. Upper chest: Please refer to the concurrent CT of the chest for further evaluation of the lungs and  mediastinum. Other: Mild edema within the subcutaneous fat of the anterior neck and supraclavicular fossa. IMPRESSION: Mild edema within the subcutaneous fat of the anterior neck and supraclavicular fossa. No associated mass or fluid collection identified. Otherwise unremarkable CT of the neck. Electronically Signed   By: Kristine Garbe M.D.   On: 03/06/2018 16:46   Ct Chest W Contrast  Result Date: 03/06/2018 CLINICAL DATA:  Swelling to neck face and shoulders since last Wednesday, history of peritoneal carcinomatosis EXAM: CT CHEST WITH CONTRAST TECHNIQUE: Multidetector CT imaging of the chest was performed during intravenous contrast administration. Sagittal and coronal MPR images reconstructed from axial data set. CONTRAST:  7mL OMNIPAQUE IOHEXOL 300 MG/ML  SOLN IV. COMPARISON:  10/26/2017 FINDINGS: Cardiovascular: Aorta normal caliber. Thoracic vascular structures grossly patent on nondedicated exam. RIGHT jugular Port-A-Cath with tip  in SVC near cavoatrial junction. Heart unremarkable. No pericardial effusion. Flow is identified within collaterals in the LEFT superior mediastinum to azygos system. SVC appears patent but does demonstrate an area of probable mild narrowing near the cavoatrial junction. Mediastinum/Nodes: Esophagus unremarkable. No thoracic adenopathy. Base of cervical region normal appearance. Lungs/Pleura: Mild dependent atelectasis in the posterior lower lobes. Lungs otherwise clear. No pulmonary infiltrate, pleural effusion or pneumothorax. 3 mm LEFT upper lobe nodule image 34, better visualized on previous study. Upper Abdomen: Significant ascites in the upper abdomen. Omental thickening/infiltration. Musculoskeletal: No acute osseous findings. IMPRESSION: Ascites and omental thickening/infiltration in the upper abdomen again identified. Relative narrowing of the distal SVC near cavoatrial junction though appears to remain patent. No other intrathoracic abnormalities.  Electronically Signed   By: Lavonia Dana M.D.   On: 03/06/2018 16:39   US Paracentesis  Result Date: 03/27/2018 INDICATION: Patient with history of peritoneal carcinomatosis, abdominal distention, recurrent ascites. Request is made for therapeutic paracentesis. EXAM: ULTRASOUND GUIDED tHERAPEUTIC PARACENTESIS MEDICATIONS: 10 mL 1% lidocaine COMPLICATIONS: None immediate. PROCEDURE: Informed written consent was obtained from the patient after a discussion of the risks, benefits and alternatives to treatment. A timeout was performed prior to the initiation of the procedure. Initial ultrasound scanning demonstrates a moderate amount of ascites within the left lateral abdomen. The left lateral abdomen was prepped and draped in the usual sterile fashion. 1% lidocaine was used for local anesthesia. Following this, a 19 gauge, 7-cm, Yueh catheter was introduced. An ultrasound image was saved for documentation purposes. The paracentesis was performed. The catheter was removed and a dressing was applied. The patient tolerated the procedure well without immediate post procedural complication. FINDINGS: A total of approximately 2.9 liters of dark, amber fluid was removed. IMPRESSION: Successful ultrasound-guided therapeutic paracentesis yielding 2.9 liters of peritoneal fluid. Read by: Brynda Greathouse PA-C Electronically Signed   By: Marybelle Killings M.D.   On: 03/27/2018 09:19    ASSESSMENT: Peritoneal carcinomatosis, metastatic adenocarcinoma of likely lower GI primary.  PLAN:    1. Peritoneal carcinomatosis: Patient had recent paracentesis to remove 2.9 L of fluid.  Given this and her worsening symptoms, she likely has progressive disease.  Rather than enrolling on clinical trial at [facility], patient will initiate palliative chemotherapy using Irinotecan and Panitumumab only.  Patient will receive treatment every 2 weeks and will reimage in approximately 3 months.  Proceed with cycle 1 of Irinotecan and Panitumumab.  Return  to clinic in 1 week for laboratory work and further evaluation and then in 2 weeks for further evaluation and consideration of cycle 2.  Previously, [facility] testing did not reveal any actionable mutations.   2.  Ascites: Paracentesis on March 27, 2018 reviewed 2.9 L of fluid.  Likely secondary to progressive disease. 3.  Facial swelling: Resolved.  Unclear etiology, but most likely related to Botox injections. 4.  Pain: Patient was instructed to take Percocet as needed.    I spent a total of 30 minutes face-to-face with the patient of which greater than 50% of the visit was spent in counseling and coordination of care as detailed above.   Patient expressed understanding and was in agreement with this plan. She also understands that She can call clinic at any time with any questions, concerns, or complaints.     Lloyd Huger, MD 04/04/18 3:45 PM

## 2018-04-02 ENCOUNTER — Ambulatory Visit: Payer: BLUE CROSS/BLUE SHIELD | Admitting: Family Medicine

## 2018-04-02 ENCOUNTER — Inpatient Hospital Stay (HOSPITAL_BASED_OUTPATIENT_CLINIC_OR_DEPARTMENT_OTHER): Payer: BLUE CROSS/BLUE SHIELD | Admitting: Oncology

## 2018-04-02 ENCOUNTER — Inpatient Hospital Stay: Payer: BLUE CROSS/BLUE SHIELD

## 2018-04-02 ENCOUNTER — Encounter: Payer: Self-pay | Admitting: Oncology

## 2018-04-02 VITALS — BP 111/68 | HR 95 | Temp 97.2°F | Resp 18 | Wt 147.2 lb

## 2018-04-02 DIAGNOSIS — F419 Anxiety disorder, unspecified: Secondary | ICD-10-CM

## 2018-04-02 DIAGNOSIS — C786 Secondary malignant neoplasm of retroperitoneum and peritoneum: Secondary | ICD-10-CM

## 2018-04-02 DIAGNOSIS — K219 Gastro-esophageal reflux disease without esophagitis: Secondary | ICD-10-CM

## 2018-04-02 DIAGNOSIS — C801 Malignant (primary) neoplasm, unspecified: Secondary | ICD-10-CM | POA: Diagnosis not present

## 2018-04-02 DIAGNOSIS — R188 Other ascites: Secondary | ICD-10-CM | POA: Diagnosis not present

## 2018-04-02 DIAGNOSIS — R197 Diarrhea, unspecified: Secondary | ICD-10-CM

## 2018-04-02 DIAGNOSIS — R109 Unspecified abdominal pain: Secondary | ICD-10-CM | POA: Diagnosis not present

## 2018-04-02 DIAGNOSIS — Z79899 Other long term (current) drug therapy: Secondary | ICD-10-CM

## 2018-04-02 DIAGNOSIS — Z87891 Personal history of nicotine dependence: Secondary | ICD-10-CM

## 2018-04-02 DIAGNOSIS — R110 Nausea: Secondary | ICD-10-CM

## 2018-04-02 DIAGNOSIS — M1990 Unspecified osteoarthritis, unspecified site: Secondary | ICD-10-CM

## 2018-04-02 DIAGNOSIS — E785 Hyperlipidemia, unspecified: Secondary | ICD-10-CM

## 2018-04-02 LAB — COMPREHENSIVE METABOLIC PANEL
ALT: 11 U/L (ref 0–44)
AST: 18 U/L (ref 15–41)
Albumin: 3.6 g/dL (ref 3.5–5.0)
Alkaline Phosphatase: 100 U/L (ref 38–126)
Anion gap: 9 (ref 5–15)
BILIRUBIN TOTAL: 0.5 mg/dL (ref 0.3–1.2)
BUN: 12 mg/dL (ref 6–20)
CO2: 26 mmol/L (ref 22–32)
Calcium: 9.1 mg/dL (ref 8.9–10.3)
Chloride: 103 mmol/L (ref 98–111)
Creatinine, Ser: 0.71 mg/dL (ref 0.44–1.00)
GFR calc Af Amer: >60 mL/min (ref >60)
Glucose, Bld: 110 mg/dL — ABNORMAL HIGH (ref 70–99)
Potassium: 3.6 mmol/L (ref 3.5–5.1)
Sodium: 138 mmol/L (ref 135–145)
Total Protein: 6.9 g/dL (ref 6.5–8.1)

## 2018-04-02 LAB — CBC WITH DIFFERENTIAL/PLATELET
Basophils Absolute: 0 10*3/uL (ref 0–0.1)
Basophils Relative: 0 %
EOS ABS: 0.1 10*3/uL (ref 0–0.7)
EOS PCT: 1 %
HCT: 37.6 % (ref 35.0–47.0)
Hemoglobin: 12.4 g/dL (ref 12.0–16.0)
LYMPHS ABS: 1.2 10*3/uL (ref 1.0–3.6)
Lymphocytes Relative: 24 %
MCH: 27.1 pg (ref 26.0–34.0)
MCHC: 33.1 g/dL (ref 32.0–36.0)
MCV: 81.8 fL (ref 80.0–100.0)
Monocytes Absolute: 0.5 10*3/uL (ref 0.2–0.9)
Monocytes Relative: 10 %
Neutro Abs: 3.4 10*3/uL (ref 1.4–6.5)
Neutrophils Relative %: 65 %
PLATELETS: 234 10*3/uL (ref 150–440)
RBC: 4.59 MIL/uL (ref 3.80–5.20)
RDW: 17.3 % — AB (ref 11.5–14.5)
WBC: 5.3 10*3/uL (ref 3.6–11.0)

## 2018-04-02 LAB — MAGNESIUM: MAGNESIUM: 1.7 mg/dL (ref 1.7–2.4)

## 2018-04-02 MED ORDER — HEPARIN SOD (PORK) LOCK FLUSH 100 UNIT/ML IV SOLN
500.0000 [IU] | Freq: Once | INTRAVENOUS | Status: AC
  Administered 2018-04-02: 500 [IU] via INTRAVENOUS
  Filled 2018-04-02: qty 5

## 2018-04-02 MED ORDER — SODIUM CHLORIDE 0.9 % IV SOLN: 10.0000 mg | Freq: Once | INTRAVENOUS | Status: DC

## 2018-04-02 MED ORDER — IRINOTECAN HCL CHEMO INJECTION 100 MG/5ML
180.0000 mg/m2 | Freq: Once | INTRAVENOUS | Status: AC
  Administered 2018-04-02: 300 mg via INTRAVENOUS
  Filled 2018-04-02: qty 15

## 2018-04-02 MED ORDER — PALONOSETRON HCL INJECTION 0.25 MG/5ML
0.2500 mg | Freq: Once | INTRAVENOUS | Status: AC
  Administered 2018-04-02: 0.25 mg via INTRAVENOUS
  Filled 2018-04-02: qty 5

## 2018-04-02 MED ORDER — SODIUM CHLORIDE 0.9 % IV SOLN
6.0000 mg/kg | Freq: Once | INTRAVENOUS | Status: AC
  Administered 2018-04-02: 400 mg via INTRAVENOUS
  Filled 2018-04-02: qty 20

## 2018-04-02 MED ORDER — SODIUM CHLORIDE 0.9% FLUSH
10.0000 mL | INTRAVENOUS | Status: DC | PRN
  Administered 2018-04-02: 10 mL via INTRAVENOUS
  Filled 2018-04-02: qty 10

## 2018-04-02 MED ORDER — SODIUM CHLORIDE 0.9 % IV SOLN
Freq: Once | INTRAVENOUS | Status: AC
  Administered 2018-04-02: 10:00:00 via INTRAVENOUS
  Filled 2018-04-02: qty 250

## 2018-04-02 MED ORDER — ATROPINE SULFATE 1 MG/ML IJ SOLN
0.5000 mg | Freq: Once | INTRAMUSCULAR | Status: AC | PRN
  Administered 2018-04-02: 0.5 mg via INTRAVENOUS
  Filled 2018-04-02: qty 1

## 2018-04-02 MED ORDER — DEXAMETHASONE SODIUM PHOSPHATE 10 MG/ML IJ SOLN
10.0000 mg | Freq: Once | INTRAMUSCULAR | Status: AC
  Administered 2018-04-02: 10 mg via INTRAVENOUS
  Filled 2018-04-02: qty 1

## 2018-04-02 NOTE — Progress Notes (Signed)
Patient here today for follow up and treatment consideration regarding peritoneal carcinomatosis.

## 2018-04-06 ENCOUNTER — Encounter: Payer: Self-pay | Admitting: Oncology

## 2018-04-08 NOTE — Progress Notes (Signed)
[place]  Telephone:(336) [phone] Fax:(336) [phone]  ID: Valerie Horton OB: 11-17-1960  MR#: [id number]  OAC#:[id number]  Patient Care Team: Valerie Roys, DO as PCP - General (Family Medicine) Clent Jacks, RN as Registered Nurse Garnetta Buddy, MD as Consulting Physician (Internal Medicine)  CHIEF COMPLAINT: Peritoneal carcinomatosis, metastatic adenocarcinoma of likely lower GI primary.  INTERVAL HISTORY: Patient returns to clinic today for further evaluation and to assess her toleration of cycle 1 of irinotecan and Panitumumab.  She tolerated her treatments relatively well.  She has a poor appetite and weight loss.  She continues to have loose stools, diarrhea, intermittent abdominal pain.  She has increased weakness and fatigue. She continues to have nausea and increased bloating.  She has no neurologic complaints.  She has no further facial swelling.  She denies any chest pain or shortness of breath.  She has no urinary complaints.  Patient offers no further specific complaints today.  REVIEW OF SYSTEMS:   Review of Systems  Constitutional: Positive for malaise/fatigue and weight loss. Negative for fever.  Respiratory: Negative.  Negative for cough and shortness of breath.   Cardiovascular: Negative.  Negative for chest pain and leg swelling.  Gastrointestinal: Positive for abdominal pain, diarrhea and nausea. Negative for blood in stool, constipation, melena and vomiting.  Genitourinary: Negative.  Negative for dysuria.  Musculoskeletal: Negative.  Negative for back pain.  Skin: Negative.  Negative for rash.  Neurological: Positive for weakness. Negative for sensory change, focal weakness and headaches.  Psychiatric/Behavioral: The patient is nervous/anxious.     As per HPI. Otherwise, a complete review of systems is negative.  PAST MEDICAL HISTORY: Past Medical History:  Diagnosis Date  . Acid reflux   . Allergic rhinitis   . Anxiety   .  Arthritis    left knee  . Cervical spondylosis   . CTS (carpal tunnel syndrome)    Right  . Diverticulosis   . HSV-2 (herpes simplex virus 2) infection   . Hyperlipidemia   . Ovarian failure   . Peritoneal carcinomatosis ([place]) 01/2016   chemo tx's.   . Rosacea   . Wears contact lenses     PAST SURGICAL HISTORY: Past Surgical History:  Procedure Laterality Date  . ANTERIOR CRUCIATE LIGAMENT REPAIR Left   . CESAREAN SECTION    . COLONOSCOPY WITH PROPOFOL N/A 01/29/2016   Procedure: COLONOSCOPY WITH PROPOFOL;  Surgeon: Lucilla Lame, MD;  Location: [facility];  Service: Endoscopy;  Laterality: N/A;  . COLONOSCOPY WITH PROPOFOL N/A 01/01/2018   Procedure: COLONOSCOPY WITH PROPOFOL;  Surgeon: Lucilla Lame, MD;  Location: [facility];  Service: Endoscopy;  Laterality: N/A;  . ESOPHAGOGASTRODUODENOSCOPY (EGD) WITH PROPOFOL N/A 01/29/2016   Procedure: ESOPHAGOGASTRODUODENOSCOPY (EGD) WITH PROPOFOL;  Surgeon: Lucilla Lame, MD;  Location: [facility];  Service: Endoscopy;  Laterality: N/A;  . ESOPHAGOGASTRODUODENOSCOPY (EGD) WITH PROPOFOL N/A 01/01/2018   Procedure: ESOPHAGOGASTRODUODENOSCOPY (EGD) WITH PROPOFOL;  Surgeon: Lucilla Lame, MD;  Location: [facility];  Service: Endoscopy;  Laterality: N/A;  . PERIPHERAL VASCULAR CATHETERIZATION N/A 02/10/2016   Procedure: Glori Luis Cath Insertion;  Surgeon: Algernon Huxley, MD;  Location: [facility];  Service: Cardiovascular;  Laterality: N/A;    FAMILY HISTORY: Unknown.  Patient is adopted.     ADVANCED DIRECTIVES:    HEALTH MAINTENANCE: Social History   Tobacco Use  . Smoking status: Former Research scientist (life sciences)  . Smokeless tobacco: Never Used  . Tobacco comment: Quit in her 77's  Substance  Use Topics  . Alcohol use: Yes    Alcohol/week: 0.0 standard drinks    Comment: 1 drink/mo  . Drug use: No     Colonoscopy:  PAP:  Bone density:  Lipid panel:  No Known Allergies  Current Outpatient Medications  Medication  Sig Dispense Refill  . acyclovir (ZOVIRAX) 800 MG tablet TAKE 1 TABLET BY MOUTH EVERY DAY 90 tablet 1  . Cholecalciferol 1000 units capsule Take 1,000 Units by mouth daily.     . diphenhydrAMINE (BENADRYL) 25 MG tablet Take 25 mg by mouth every 6 (six) hours as needed.    Marland Kitchen escitalopram (LEXAPRO) 10 MG tablet TAKE 1 TABLET BY MOUTH EVERY DAY 90 tablet 1  . fluticasone (FLONASE) 50 MCG/ACT nasal spray Place into both nostrils daily.    Marland Kitchen gabapentin (NEURONTIN) 300 MG capsule Take 300 mg by mouth at bedtime.    Marland Kitchen HYDROcodone-acetaminophen (NORCO/VICODIN) 5-325 MG tablet TAKE 1 TABLET BY MOUTH EVERY 4 TO 6 HOURS AS NEEDED FOR PAIN WITH FOOD  0  . lidocaine-prilocaine (EMLA) cream Apply 1 application topically as needed. 30 g 3  . meloxicam (MOBIC) 15 MG tablet Mobic 15 mg tablet  Take 1 tablet every day by oral route with meals.    . methocarbamol (ROBAXIN) 500 MG tablet Take 500 mg by mouth 2 (two) times daily.  0  . naproxen (NAPROSYN) 500 MG tablet Take 1 tablet (500 mg total) by mouth 2 (two) times daily with a meal. 90 tablet 1  . ondansetron (ZOFRAN) 8 MG tablet Take 1 tablet (8 mg total) by mouth every 8 (eight) hours as needed for nausea or vomiting. 30 tablet 2  . ondansetron (ZOFRAN-ODT) 8 MG disintegrating tablet DISSOLVE 1 TABLET ON TONGUE EVERY 6 TO 8 HOURS AS NEEDED FOR NAUSEA OR VOMITING  0  . oxyCODONE-acetaminophen (PERCOCET/ROXICET) 5-325 MG tablet Take 1-2 tablets by mouth every 4 (four) hours as needed for severe pain. 12 tablet 0  . pantoprazole (PROTONIX) 40 MG tablet TAKE 1 TABLET BY MOUTH EVERY DAY 30 tablet 5  . polyethylene glycol (MIRALAX) packet Take 17 g by mouth daily. 14 each 0  . prochlorperazine (COMPAZINE) 10 MG tablet Take 1 tablet (10 mg total) by mouth every 6 (six) hours as needed for nausea or vomiting. 30 tablet 2   No current facility-administered medications for this visit.    Facility-Administered Medications Ordered in Other Visits  Medication Dose  Route Frequency Provider Last Rate Last Dose  . 0.9 %  sodium chloride infusion   Intravenous Once Lloyd Huger, MD      . heparin lock flush 100 unit/mL  500 Units Intravenous Once Lloyd Huger, MD      . sodium chloride flush (NS) 0.9 % injection 10 mL  10 mL Intravenous PRN Lloyd Huger, MD   10 mL at 08/16/16 0925    OBJECTIVE: Vitals:   04/09/18 0935 04/09/18 0943  BP:  99/64  Pulse:  91  Resp: 16   Temp:  97.8 F (36.6 C)     Body mass index is 25.08 kg/m.    ECOG FS:1 - Symptomatic but completely ambulatory  General: Well-developed, well-nourished, no acute distress. Eyes: Pink conjunctiva, anicteric sclera. HEENT: Normocephalic, moist mucous membranes. Lungs: Clear to auscultation bilaterally. Heart: Regular rate and rhythm. No rubs, murmurs, or gallops. Abdomen: Mildly distended, mildly tender. Musculoskeletal: No edema, cyanosis, or clubbing. Neuro: Alert, answering all questions appropriately. Cranial nerves grossly intact. Skin: No rashes or petechiae  noted. Psych: Normal affect.  LAB RESULTS:  Lab Results  Component Value Date   NA 135 04/09/2018   K 3.3 (L) 04/09/2018   CL 100 04/09/2018   CO2 26 04/09/2018   GLUCOSE 101 (H) 04/09/2018   BUN 7 04/09/2018   CREATININE 0.74 04/09/2018   CALCIUM 8.9 04/09/2018   PROT 6.6 04/09/2018   ALBUMIN 3.5 04/09/2018   AST 17 04/09/2018   ALT 10 04/09/2018   ALKPHOS 94 04/09/2018   BILITOT 0.6 04/09/2018   GFRNONAA >60 04/09/2018   GFRAA >60 04/09/2018    Lab Results  Component Value Date   WBC 3.3 (L) 04/09/2018   NEUTROABS 1.8 04/09/2018   HGB 12.5 04/09/2018   HCT 36.8 04/09/2018   MCV 80.1 04/09/2018   PLT 178 04/09/2018     STUDIES: US Paracentesis  Result Date: 03/27/2018 INDICATION: Patient with history of peritoneal carcinomatosis, abdominal distention, recurrent ascites. Request is made for therapeutic paracentesis. EXAM: ULTRASOUND GUIDED tHERAPEUTIC PARACENTESIS  MEDICATIONS: 10 mL 1% lidocaine COMPLICATIONS: None immediate. PROCEDURE: Informed written consent was obtained from the patient after a discussion of the risks, benefits and alternatives to treatment. A timeout was performed prior to the initiation of the procedure. Initial ultrasound scanning demonstrates a moderate amount of ascites within the left lateral abdomen. The left lateral abdomen was prepped and draped in the usual sterile fashion. 1% lidocaine was used for local anesthesia. Following this, a 19 gauge, 7-cm, Yueh catheter was introduced. An ultrasound image was saved for documentation purposes. The paracentesis was performed. The catheter was removed and a dressing was applied. The patient tolerated the procedure well without immediate post procedural complication. FINDINGS: A total of approximately 2.9 liters of dark, amber fluid was removed. IMPRESSION: Successful ultrasound-guided therapeutic paracentesis yielding 2.9 liters of peritoneal fluid. Read by: Brynda Greathouse PA-C Electronically Signed   By: Marybelle Killings M.D.   On: 03/27/2018 09:19    ASSESSMENT: Peritoneal carcinomatosis, metastatic adenocarcinoma of likely lower GI primary.  PLAN:    1. Peritoneal carcinomatosis: Given this and her worsening symptoms, she likely has progressive disease.  Rather than enrolling on clinical trial at [facility], patient initiated palliative chemotherapy using Irinotecan and Panitumumab only.  Patient will receive treatment every 2 weeks and will reimage in approximately 3 months.  She received cycle 1 of Irinotecan and Panitumumab last week.  Return to clinic in 1 week for further evaluation and consideration of cycle 2.  Previously, Foundation 1 testing did not reveal any actionable mutations.   2.  Ascites: Paracentesis on March 27, 2018 reviewed 2.9 L of fluid.  Likely secondary to progressive disease. 3.  Facial swelling: Resolved.  Unclear etiology, but most likely related to Botox injections. 4.   Pain: Patient was instructed to take Percocet as needed.   5.  Disposition: Short and long-term goals were discussed with palliative care.  Appreciate their input. 6.  Poor appetite/weight loss: Patient has appointment with dietary in 1 week.   Patient expressed understanding and was in agreement with this plan. She also understands that She can call clinic at any time with any questions, concerns, or complaints.     Lloyd Huger, MD 04/09/18 10:49 AM

## 2018-04-09 ENCOUNTER — Inpatient Hospital Stay (HOSPITAL_BASED_OUTPATIENT_CLINIC_OR_DEPARTMENT_OTHER): Payer: BLUE CROSS/BLUE SHIELD | Admitting: Oncology

## 2018-04-09 ENCOUNTER — Inpatient Hospital Stay: Payer: BLUE CROSS/BLUE SHIELD | Attending: Oncology

## 2018-04-09 ENCOUNTER — Encounter: Payer: Self-pay | Admitting: Oncology

## 2018-04-09 ENCOUNTER — Inpatient Hospital Stay (HOSPITAL_BASED_OUTPATIENT_CLINIC_OR_DEPARTMENT_OTHER): Payer: BLUE CROSS/BLUE SHIELD | Admitting: Hospice and Palliative Medicine

## 2018-04-09 VITALS — BP 99/64 | HR 91 | Temp 97.8°F | Resp 16 | Ht 63.0 in | Wt 141.6 lb

## 2018-04-09 DIAGNOSIS — R197 Diarrhea, unspecified: Secondary | ICD-10-CM

## 2018-04-09 DIAGNOSIS — R634 Abnormal weight loss: Secondary | ICD-10-CM

## 2018-04-09 DIAGNOSIS — R5382 Chronic fatigue, unspecified: Secondary | ICD-10-CM

## 2018-04-09 DIAGNOSIS — R21 Rash and other nonspecific skin eruption: Secondary | ICD-10-CM | POA: Diagnosis not present

## 2018-04-09 DIAGNOSIS — D701 Agranulocytosis secondary to cancer chemotherapy: Secondary | ICD-10-CM | POA: Diagnosis not present

## 2018-04-09 DIAGNOSIS — E876 Hypokalemia: Secondary | ICD-10-CM | POA: Diagnosis not present

## 2018-04-09 DIAGNOSIS — C786 Secondary malignant neoplasm of retroperitoneum and peritoneum: Secondary | ICD-10-CM | POA: Insufficient documentation

## 2018-04-09 DIAGNOSIS — K219 Gastro-esophageal reflux disease without esophagitis: Secondary | ICD-10-CM

## 2018-04-09 DIAGNOSIS — Z5112 Encounter for antineoplastic immunotherapy: Secondary | ICD-10-CM | POA: Insufficient documentation

## 2018-04-09 DIAGNOSIS — R531 Weakness: Secondary | ICD-10-CM

## 2018-04-09 DIAGNOSIS — Z87891 Personal history of nicotine dependence: Secondary | ICD-10-CM | POA: Insufficient documentation

## 2018-04-09 DIAGNOSIS — Z79899 Other long term (current) drug therapy: Secondary | ICD-10-CM | POA: Insufficient documentation

## 2018-04-09 DIAGNOSIS — R110 Nausea: Secondary | ICD-10-CM | POA: Diagnosis not present

## 2018-04-09 DIAGNOSIS — C801 Malignant (primary) neoplasm, unspecified: Secondary | ICD-10-CM | POA: Diagnosis not present

## 2018-04-09 DIAGNOSIS — R112 Nausea with vomiting, unspecified: Secondary | ICD-10-CM | POA: Diagnosis not present

## 2018-04-09 DIAGNOSIS — R630 Anorexia: Secondary | ICD-10-CM

## 2018-04-09 DIAGNOSIS — M1990 Unspecified osteoarthritis, unspecified site: Secondary | ICD-10-CM | POA: Diagnosis not present

## 2018-04-09 DIAGNOSIS — E785 Hyperlipidemia, unspecified: Secondary | ICD-10-CM

## 2018-04-09 DIAGNOSIS — Z5111 Encounter for antineoplastic chemotherapy: Secondary | ICD-10-CM | POA: Insufficient documentation

## 2018-04-09 DIAGNOSIS — Z515 Encounter for palliative care: Secondary | ICD-10-CM

## 2018-04-09 DIAGNOSIS — G893 Neoplasm related pain (acute) (chronic): Secondary | ICD-10-CM | POA: Diagnosis not present

## 2018-04-09 DIAGNOSIS — R109 Unspecified abdominal pain: Secondary | ICD-10-CM

## 2018-04-09 DIAGNOSIS — T451X5S Adverse effect of antineoplastic and immunosuppressive drugs, sequela: Secondary | ICD-10-CM | POA: Insufficient documentation

## 2018-04-09 DIAGNOSIS — K5900 Constipation, unspecified: Secondary | ICD-10-CM | POA: Insufficient documentation

## 2018-04-09 DIAGNOSIS — F419 Anxiety disorder, unspecified: Secondary | ICD-10-CM | POA: Insufficient documentation

## 2018-04-09 DIAGNOSIS — R180 Malignant ascites: Secondary | ICD-10-CM | POA: Diagnosis not present

## 2018-04-09 DIAGNOSIS — R5381 Other malaise: Secondary | ICD-10-CM | POA: Insufficient documentation

## 2018-04-09 DIAGNOSIS — R509 Fever, unspecified: Secondary | ICD-10-CM | POA: Insufficient documentation

## 2018-04-09 LAB — COMPREHENSIVE METABOLIC PANEL
ALBUMIN: 3.5 g/dL (ref 3.5–5.0)
ALT: 10 U/L (ref 0–44)
AST: 17 U/L (ref 15–41)
Alkaline Phosphatase: 94 U/L (ref 38–126)
Anion gap: 9 (ref 5–15)
BUN: 7 mg/dL (ref 6–20)
CHLORIDE: 100 mmol/L (ref 98–111)
CO2: 26 mmol/L (ref 22–32)
CREATININE: 0.74 mg/dL (ref 0.44–1.00)
Calcium: 8.9 mg/dL (ref 8.9–10.3)
GFR calc Af Amer: >60 mL/min (ref >60)
GLUCOSE: 101 mg/dL — AB (ref 70–99)
Potassium: 3.3 mmol/L — ABNORMAL LOW (ref 3.5–5.1)
Sodium: 135 mmol/L (ref 135–145)
Total Bilirubin: 0.6 mg/dL (ref 0.3–1.2)
Total Protein: 6.6 g/dL (ref 6.5–8.1)

## 2018-04-09 LAB — CBC WITH DIFFERENTIAL/PLATELET
BASOS ABS: 0 10*3/uL (ref 0–0.1)
BASOS PCT: 0 %
Eosinophils Absolute: 0 10*3/uL (ref 0–0.7)
Eosinophils Relative: 1 %
HCT: 36.8 % (ref 35.0–47.0)
Hemoglobin: 12.5 g/dL (ref 12.0–16.0)
LYMPHS ABS: 1.1 10*3/uL (ref 1.0–3.6)
LYMPHS PCT: 34 %
MCH: 27.2 pg (ref 26.0–34.0)
MCHC: 33.9 g/dL (ref 32.0–36.0)
MCV: 80.1 fL (ref 80.0–100.0)
MONO ABS: 0.3 10*3/uL (ref 0.2–0.9)
MONOS PCT: 9 %
Neutro Abs: 1.8 10*3/uL (ref 1.4–6.5)
Neutrophils Relative %: 56 %
PLATELETS: 178 10*3/uL (ref 150–440)
RBC: 4.59 MIL/uL (ref 3.80–5.20)
RDW: 16.6 % — ABNORMAL HIGH (ref 11.5–14.5)
WBC: 3.3 10*3/uL — AB (ref 3.6–11.0)

## 2018-04-09 LAB — MAGNESIUM: Magnesium: 1.7 mg/dL (ref 1.7–2.4)

## 2018-04-09 NOTE — Progress Notes (Signed)
Patient here today for follow up. She reports continued intermittant pain in her abdomen. No relieved by Tramadol. She had one day of diarrhea this weekend. She has lost approx. 6 pounds.

## 2018-04-09 NOTE — Progress Notes (Signed)
Valerie Horton  Telephone:([phone] Fax:(336) [phone]   Name: Valerie Horton Date: 04/09/2018 MRN: [id number]  DOB: 08-Nov-1960  Patient Care Team: Valerie Roys, DO as PCP - General (Family Medicine) Clent Jacks, RN as Registered Nurse Garnetta Buddy, MD as Consulting Physician (Internal Medicine)    [place]: Palliative Care consult requested for this 57 y.o. female for goals of medical treatment in patient with multiple medical problems including stage IV peritoneal carcinomatosis initially diagnosed in July 2017. She is status post 12 cycles of FOLFOX (completed January 2018) with subsequent disease progression.  She was treated with 18 cycles of FOLFIRI beginning January 2018 and ending October 2019.  She restarted FOLFIRI July 2019 but had poor tolerance.  She is now being treated with irinotecan and Panitumumab.  Unfortunately patient has had evidence of progressive disease with malignant ascites requiring recent therapeutic paracentesis, poor oral intake, and subsequent weight loss.  Palliative care has been asked to help with symptom management, patient/family support, and to help address treatment goals.   SOCIAL HISTORY:   Patient lives at home with her husband and adult daughter.  She has two other adult children who do not live at home.  Patient formally worked as a Copywriter, advertising.  ADVANCE DIRECTIVES:  Patient's husband is her healthcare power of attorney.  Patient has completed a living will.  CODE STATUS: DNR  PAST MEDICAL HISTORY: Past Medical History:  Diagnosis Date  . Acid reflux   . Allergic rhinitis   . Anxiety   . Arthritis    left knee  . Cervical spondylosis   . CTS (carpal tunnel syndrome)    Right  . Diverticulosis   . HSV-2 (herpes simplex virus 2) infection   . Hyperlipidemia   . Ovarian failure   . Peritoneal carcinomatosis ([place]) 01/2016   chemo tx's.   . Rosacea   .  Wears contact lenses     PAST SURGICAL HISTORY:  Past Surgical History:  Procedure Laterality Date  . ANTERIOR CRUCIATE LIGAMENT REPAIR Left   . CESAREAN SECTION    . COLONOSCOPY WITH PROPOFOL N/A 01/29/2016   Procedure: COLONOSCOPY WITH PROPOFOL;  Surgeon: Lucilla Lame, MD;  Location: [facility];  Service: Endoscopy;  Laterality: N/A;  . COLONOSCOPY WITH PROPOFOL N/A 01/01/2018   Procedure: COLONOSCOPY WITH PROPOFOL;  Surgeon: Lucilla Lame, MD;  Location: [facility];  Service: Endoscopy;  Laterality: N/A;  . ESOPHAGOGASTRODUODENOSCOPY (EGD) WITH PROPOFOL N/A 01/29/2016   Procedure: ESOPHAGOGASTRODUODENOSCOPY (EGD) WITH PROPOFOL;  Surgeon: Lucilla Lame, MD;  Location: [facility];  Service: Endoscopy;  Laterality: N/A;  . ESOPHAGOGASTRODUODENOSCOPY (EGD) WITH PROPOFOL N/A 01/01/2018   Procedure: ESOPHAGOGASTRODUODENOSCOPY (EGD) WITH PROPOFOL;  Surgeon: Lucilla Lame, MD;  Location: [facility];  Service: Endoscopy;  Laterality: N/A;  . PERIPHERAL VASCULAR CATHETERIZATION N/A 02/10/2016   Procedure: Glori Luis Cath Insertion;  Surgeon: Algernon Huxley, MD;  Location: [facility];  Service: Cardiovascular;  Laterality: N/A;    HEMATOLOGY/ONCOLOGY HISTORY:  Oncology History   Patient with initial diagnosis in July 2017 where she presented to the emergency room for abdominal pain, bloating and fullness.  She was found to have ascites and a paracentesis was performed.  Ca 125 was elevated.  Omental biopsy was performed along with testing of the fluid from recent paracentesis.  Pathology revealed lower GI primary.  She was started on FOLFOX for 12 cycles.  Compleated 11 cycles on 07/21/2016.  Progression  of disease was noted and therapy was switched. Began FOLFIRI in January 2018.  Received 18 cycles completing in October 2018.  She required ultrasound-guided paracentesis more frequient.  She was not a surgical candidate. Foundation one testing did not reveal any actionable  mutations.  Restarted FOLFIRI chemotherapy in July 2019 with poor tolerance.      Peritoneal carcinomatosis ([place])   01/20/2016 Initial Diagnosis    Peritoneal carcinomatosis ([place])    03/30/2018 -  Chemotherapy    The patient had palonosetron (ALOXI) injection 0.25 mg, 0.25 mg, Intravenous,  Once, 1 of 6 cycles Administration: 0.25 mg (04/02/2018) irinotecan (CAMPTOSAR) 300 mg in dextrose 5 % 500 mL chemo infusion, 180 mg/m2 = 300 mg, Intravenous,  Once, 1 of 6 cycles Administration: 300 mg (04/02/2018) panitumumab (VECTIBIX) 400 mg in sodium chloride 0.9 % 100 mL chemo infusion, 6 mg/kg = 400 mg, Intravenous,  Once, 1 of 6 cycles Administration: 400 mg (04/02/2018)  for chemotherapy treatment.      ALLERGIES:  has No Known Allergies.  MEDICATIONS:  Current Outpatient Medications  Medication Sig Dispense Refill  . acyclovir (ZOVIRAX) 800 MG tablet TAKE 1 TABLET BY MOUTH EVERY DAY 90 tablet 1  . Cholecalciferol 1000 units capsule Take 1,000 Units by mouth daily.     . diphenhydrAMINE (BENADRYL) 25 MG tablet Take 25 mg by mouth every 6 (six) hours as needed.    Marland Kitchen escitalopram (LEXAPRO) 10 MG tablet TAKE 1 TABLET BY MOUTH EVERY DAY 90 tablet 1  . fluticasone (FLONASE) 50 MCG/ACT nasal spray Place into both nostrils daily.    Marland Kitchen gabapentin (NEURONTIN) 300 MG capsule Take 300 mg by mouth at bedtime.    Marland Kitchen HYDROcodone-acetaminophen (NORCO/VICODIN) 5-325 MG tablet TAKE 1 TABLET BY MOUTH EVERY 4 TO 6 HOURS AS NEEDED FOR PAIN WITH FOOD  0  . lidocaine-prilocaine (EMLA) cream Apply 1 application topically as needed. 30 g 3  . meloxicam (MOBIC) 15 MG tablet Mobic 15 mg tablet  Take 1 tablet every day by oral route with meals.    . methocarbamol (ROBAXIN) 500 MG tablet Take 500 mg by mouth 2 (two) times daily.  0  . naproxen (NAPROSYN) 500 MG tablet Take 1 tablet (500 mg total) by mouth 2 (two) times daily with a meal. 90 tablet 1  . ondansetron (ZOFRAN) 8 MG tablet Take 1 tablet (8 mg total) by mouth  every 8 (eight) hours as needed for nausea or vomiting. 30 tablet 2  . ondansetron (ZOFRAN-ODT) 8 MG disintegrating tablet DISSOLVE 1 TABLET ON TONGUE EVERY 6 TO 8 HOURS AS NEEDED FOR NAUSEA OR VOMITING  0  . oxyCODONE-acetaminophen (PERCOCET/ROXICET) 5-325 MG tablet Take 1-2 tablets by mouth every 4 (four) hours as needed for severe pain. 12 tablet 0  . pantoprazole (PROTONIX) 40 MG tablet TAKE 1 TABLET BY MOUTH EVERY DAY 30 tablet 5  . polyethylene glycol (MIRALAX) packet Take 17 g by mouth daily. 14 each 0  . prochlorperazine (COMPAZINE) 10 MG tablet Take 1 tablet (10 mg total) by mouth every 6 (six) hours as needed for nausea or vomiting. 30 tablet 2   No current facility-administered medications for this visit.    Facility-Administered Medications Ordered in Other Visits  Medication Dose Route Frequency Provider Last Rate Last Dose  . 0.9 %  sodium chloride infusion   Intravenous Once Lloyd Huger, MD      . heparin lock flush 100 unit/mL  500 Units Intravenous Once Lloyd Huger, MD      .  sodium chloride flush (NS) 0.9 % injection 10 mL  10 mL Intravenous PRN Lloyd Huger, MD   10 mL at 08/16/16 9211    VITAL SIGNS: There were no vitals taken for this visit. There were no vitals filed for this visit.  Estimated body mass index is 25.08 kg/m as calculated from the following:   Height as of an earlier encounter on 04/09/18: '5\' 3"'  (1.6 m).   Weight as of an earlier encounter on 04/09/18: 141 lb 9.6 oz (64.2 kg).  LABS: CBC:    Component Value Date/Time   WBC 3.3 (L) 04/09/2018 0925   HGB 12.5 04/09/2018 0925   HGB 12.0 09/28/2017 1417   HCT 36.8 04/09/2018 0925   HCT 35.3 09/28/2017 1417   PLT 178 04/09/2018 0925   PLT 309 09/28/2017 1417   MCV 80.1 04/09/2018 0925   MCV 84 09/28/2017 1417   NEUTROABS 1.8 04/09/2018 0925   NEUTROABS 3.9 09/28/2017 1417   LYMPHSABS 1.1 04/09/2018 0925   LYMPHSABS 1.7 09/28/2017 1417   MONOABS 0.3 04/09/2018 0925   EOSABS  0.0 04/09/2018 0925   EOSABS 0.0 09/28/2017 1417   BASOSABS 0.0 04/09/2018 0925   BASOSABS 0.0 09/28/2017 1417   Comprehensive Metabolic Panel:    Component Value Date/Time   NA 135 04/09/2018 0925   NA 139 09/28/2017 1417   K 3.3 (L) 04/09/2018 0925   CL 100 04/09/2018 0925   CO2 26 04/09/2018 0925   BUN 7 04/09/2018 0925   BUN 17 09/28/2017 1417   CREATININE 0.74 04/09/2018 0925   GLUCOSE 101 (H) 04/09/2018 0925   CALCIUM 8.9 04/09/2018 0925   AST 17 04/09/2018 0925   ALT 10 04/09/2018 0925   ALKPHOS 94 04/09/2018 0925   BILITOT 0.6 04/09/2018 0925   BILITOT <0.2 09/28/2017 1417   PROT 6.6 04/09/2018 0925   PROT 6.7 09/28/2017 1417   ALBUMIN 3.5 04/09/2018 0925   ALBUMIN 3.8 09/28/2017 1417    RADIOGRAPHIC STUDIES: US Paracentesis  Result Date: 03/27/2018 INDICATION: Patient with history of peritoneal carcinomatosis, abdominal distention, recurrent ascites. Request is made for therapeutic paracentesis. EXAM: ULTRASOUND GUIDED tHERAPEUTIC PARACENTESIS MEDICATIONS: 10 mL 1% lidocaine COMPLICATIONS: None immediate. PROCEDURE: Informed written consent was obtained from the patient after a discussion of the risks, benefits and alternatives to treatment. A timeout was performed prior to the initiation of the procedure. Initial ultrasound scanning demonstrates a moderate amount of ascites within the left lateral abdomen. The left lateral abdomen was prepped and draped in the usual sterile fashion. 1% lidocaine was used for local anesthesia. Following this, a 19 gauge, 7-cm, Yueh catheter was introduced. An ultrasound image was saved for documentation purposes. The paracentesis was performed. The catheter was removed and a dressing was applied. The patient tolerated the procedure well without immediate post procedural complication. FINDINGS: A total of approximately 2.9 liters of dark, amber fluid was removed. IMPRESSION: Successful ultrasound-guided therapeutic paracentesis yielding 2.9  liters of peritoneal fluid. Read by: Brynda Greathouse PA-C Electronically Signed   By: Marybelle Killings M.D.   On: 03/27/2018 09:19    PERFORMANCE STATUS (ECOG) : 3 - Symptomatic, >50% confined to bed  Review of Systems As noted above. Otherwise, a complete review of systems is negative.  Physical Exam General: thin, frail appearing  Lungs: Clear to auscultation bilaterally. Heart: Regular rate and rhythm. Abdomen: Soft, nontender, BS hypoactive Musculoskeletal: No edema Neuro: Alert, answering all questions appropriately. Cranial nerves grossly intact.  IMPRESSION: I met with patient and her  husband today in the clinic.  Patient complains of intermittent, crampy abdominal pain that has somewhat worsened in intensity over the past several weeks.  Patient has pain rating varies widely throughout the day but the pain tends to be worse at night.  Patient has tried tramadol and has been taking half a 50 mg tablet several times a day, which reportedly helped some but does not completely alleviate the pain.  Patient has Percocet 5-325 mg at home but has not used this despite previous instructions to do so.  I encouraged patient to try the Percocet to see if this helps her pain.  I encouraged her to start at bedtime and to maximize dosing as tolerated.  I also suggested a trial of simethicone to see if this alleviates gassy pain.  Anticholinergics might otherwise be considered but would likely be too high risk in this patient considering fear of obstruction.  Patient has poor oral intake with weight loss of 15 pounds in past 2 months.  She is being followed by the dietitian.  Patient says that she is fearful of eating as she thinks this will exacerbate her pain.  Again, I encouraged her to liberalize the pain medication.  We also discussed strategies such as frequent, small, high calorie/protein meals and use of oral supplements.   We discussed patient's coping with her illness.  She has had more anxiety and  tearfulness lately.  Patient is on Lexapro 10 mg daily and has been on that dose for many years.  Might consider increasing dose to 20 mg daily versus switching to mirtazapine, the latter which might also help with her anorexia and insomnia.  Patient and husband both seem to recognize the palliative nature of her current treatment.  Patient saw an attorney recently to help with estate planning.  Husband asked me about hospice care, which we discussed in detail.  Patient says that she would like to consider hospice when it is felt appropriate by the oncology team.  However, for now patient desires to continue treatment.  We completed a MOST form today.  Patient clearly states her desire not to be resuscitated or have her life prolonged artificially. MOST form details desire for DNAR, limited scope of treatment, and IV fluids/antbiotics if indicated. She was unsure about a feeding tube, although she recognized that such an intervention would likely be associated with poor outcomes in the setting of advanced cancer.   All questions answered. Emotional support provided. Will follow.   PLAN: 1. Recommend trial of percocet 5-347m 1 tablet q4h prn for pain (patient has this prescription in the home) 2. Recommend trial of simethicone prn 3. MOST form completed today (Patient desires DNAR/Limited scope of treatment) 4. Follow with RD 5. RTC at next appointment with Dr. FGrayland Ormond  Patient expressed understanding and was in agreement with this plan. She also understands that She can call clinic at any time with any questions, concerns, or complaints.     Time Total: 45 minutes  Visit consisted of counseling and education dealing with the complex and emotionally intense issues of symptom management and palliative care in the setting of serious and potentially life-threatening illness.Greater than 50%  of this time was spent counseling and coordinating care related to the above assessment and  plan.  Signed by: JAltha Harm DParkerfield NP-C, [place])

## 2018-04-10 ENCOUNTER — Other Ambulatory Visit: Payer: BLUE CROSS/BLUE SHIELD

## 2018-04-10 ENCOUNTER — Ambulatory Visit: Payer: BLUE CROSS/BLUE SHIELD

## 2018-04-10 ENCOUNTER — Ambulatory Visit: Payer: BLUE CROSS/BLUE SHIELD | Admitting: Oncology

## 2018-04-10 ENCOUNTER — Encounter: Payer: Self-pay | Admitting: Oncology

## 2018-04-11 ENCOUNTER — Other Ambulatory Visit: Payer: Self-pay | Admitting: *Deleted

## 2018-04-11 ENCOUNTER — Encounter: Payer: Self-pay | Admitting: Oncology

## 2018-04-11 DIAGNOSIS — C786 Secondary malignant neoplasm of retroperitoneum and peritoneum: Secondary | ICD-10-CM

## 2018-04-11 DIAGNOSIS — C801 Malignant (primary) neoplasm, unspecified: Principal | ICD-10-CM

## 2018-04-11 MED ORDER — METOCLOPRAMIDE HCL 10 MG PO TABS: 10.0000 mg | ORAL_TABLET | Freq: Three times a day (TID) | ORAL | 1 refills | Status: DC

## 2018-04-11 MED ORDER — OXYCODONE HCL 10 MG PO TABS: 10.0000 mg | ORAL_TABLET | ORAL | 0 refills | Status: DC | PRN

## 2018-04-12 ENCOUNTER — Telehealth: Payer: Self-pay | Admitting: Hospice and Palliative Medicine

## 2018-04-12 ENCOUNTER — Ambulatory Visit
Admission: RE | Admit: 2018-04-12 | Discharge: 2018-04-12 | Disposition: A | Discharge status: Home / Self Care | Payer: BLUE CROSS/BLUE SHIELD | Source: Ambulatory Visit | Attending: Oncology | Admitting: Oncology

## 2018-04-12 ENCOUNTER — Encounter: Payer: Self-pay | Admitting: Oncology

## 2018-04-12 DIAGNOSIS — C801 Malignant (primary) neoplasm, unspecified: Principal | ICD-10-CM

## 2018-04-12 DIAGNOSIS — C786 Secondary malignant neoplasm of retroperitoneum and peritoneum: Secondary | ICD-10-CM | POA: Insufficient documentation

## 2018-04-12 NOTE — Telephone Encounter (Signed)
I attempted to call patient to check on her pain. Message left.

## 2018-04-13 ENCOUNTER — Encounter: Payer: Self-pay | Admitting: Oncology

## 2018-04-15 NOTE — Progress Notes (Signed)
[place]  [phone] [phone]  ID: CORLIS ANGELICA OB: 24-Aug-1960  MR#: [id number]  BJS#:[id number]  Patient Care Team: Valerie Roys, DO as PCP - General (Family Medicine) Clent Jacks, RN as Registered Nurse Garnetta Buddy, MD as Consulting Physician (Internal Medicine)  CHIEF COMPLAINT: Peritoneal carcinomatosis, metastatic adenocarcinoma of likely lower GI primary.  INTERVAL HISTORY: Patient returns to clinic today for further evaluation and consideration of cycle 2 of irinotecan and Panitumumab.  She continues to have ongoing abdominal pain and diarrhea.  She has a poor appetite and weight loss.  She continues to have increased nausea.  She has a rash on her face consistent with Panitumumab.  She has chronic weakness and fatigue. She has no neurologic complaints.  She has no further facial swelling.  She denies any chest pain or shortness of breath.  She has no urinary complaints.  Patient offers no further specific complaints today.  REVIEW OF SYSTEMS:   Review of Systems  Constitutional: Positive for malaise/fatigue and weight loss. Negative for fever.  Respiratory: Negative.  Negative for cough and shortness of breath.   Cardiovascular: Negative.  Negative for chest pain and leg swelling.  Gastrointestinal: Positive for abdominal pain, diarrhea and nausea. Negative for blood in stool, constipation, melena and vomiting.  Genitourinary: Negative.  Negative for dysuria.  Musculoskeletal: Negative.  Negative for back pain.  Skin: Positive for rash.  Neurological: Positive for weakness. Negative for sensory change, focal weakness and headaches.  Psychiatric/Behavioral: The patient is nervous/anxious.     As per HPI. Otherwise, a complete review of systems is negative.  PAST MEDICAL HISTORY: Past Medical History:  Diagnosis Date  . Acid reflux   . Allergic rhinitis   . Anxiety   . Arthritis    left knee  . Cervical  spondylosis   . CTS (carpal tunnel syndrome)    Right  . Diverticulosis   . HSV-2 (herpes simplex virus 2) infection   . Hyperlipidemia   . Ovarian failure   . Peritoneal carcinomatosis ([place]) 01/2016   chemo tx's.   . Rosacea   . Wears contact lenses     PAST SURGICAL HISTORY: Past Surgical History:  Procedure Laterality Date  . ANTERIOR CRUCIATE LIGAMENT REPAIR Left   . CESAREAN SECTION    . COLONOSCOPY WITH PROPOFOL N/A 01/29/2016   Procedure: COLONOSCOPY WITH PROPOFOL;  Surgeon: Lucilla Lame, MD;  Location: [facility];  Service: Endoscopy;  Laterality: N/A;  . COLONOSCOPY WITH PROPOFOL N/A 01/01/2018   Procedure: COLONOSCOPY WITH PROPOFOL;  Surgeon: Lucilla Lame, MD;  Location: [facility];  Service: Endoscopy;  Laterality: N/A;  . ESOPHAGOGASTRODUODENOSCOPY (EGD) WITH PROPOFOL N/A 01/29/2016   Procedure: ESOPHAGOGASTRODUODENOSCOPY (EGD) WITH PROPOFOL;  Surgeon: Lucilla Lame, MD;  Location: [facility];  Service: Endoscopy;  Laterality: N/A;  . ESOPHAGOGASTRODUODENOSCOPY (EGD) WITH PROPOFOL N/A 01/01/2018   Procedure: ESOPHAGOGASTRODUODENOSCOPY (EGD) WITH PROPOFOL;  Surgeon: Lucilla Lame, MD;  Location: [facility];  Service: Endoscopy;  Laterality: N/A;  . PERIPHERAL VASCULAR CATHETERIZATION N/A 02/10/2016   Procedure: Glori Luis Cath Insertion;  Surgeon: Algernon Huxley, MD;  Location: [facility];  Service: Cardiovascular;  Laterality: N/A;    FAMILY HISTORY: Unknown.  Patient is adopted.     ADVANCED DIRECTIVES:    HEALTH MAINTENANCE: Social History   Tobacco Use  . Smoking status: Former Research scientist (life sciences)  . Smokeless tobacco: Never Used  . Tobacco comment: Quit in her 21's  Substance Use Topics  .  Alcohol use: Yes    Alcohol/week: 0.0 standard drinks    Comment: 1 drink/mo  . Drug use: No     Colonoscopy:  PAP:  Bone density:  Lipid panel:  No Known Allergies  Current Outpatient Medications  Medication Sig Dispense Refill  . acyclovir  (ZOVIRAX) 800 MG tablet TAKE 1 TABLET BY MOUTH EVERY DAY 90 tablet 1  . Cholecalciferol 1000 units capsule Take 1,000 Units by mouth daily.     . diphenhydrAMINE (BENADRYL) 25 MG tablet Take 25 mg by mouth every 6 (six) hours as needed.    Marland Kitchen escitalopram (LEXAPRO) 10 MG tablet TAKE 1 TABLET BY MOUTH EVERY DAY 90 tablet 1  . fluticasone (FLONASE) 50 MCG/ACT nasal spray Place into both nostrils daily.    Marland Kitchen gabapentin (NEURONTIN) 300 MG capsule Take 300 mg by mouth at bedtime.    Marland Kitchen HYDROcodone-acetaminophen (NORCO/VICODIN) 5-325 MG tablet TAKE 1 TABLET BY MOUTH EVERY 4 TO 6 HOURS AS NEEDED FOR PAIN WITH FOOD  0  . lidocaine-prilocaine (EMLA) cream Apply 1 application topically as needed. 30 g 3  . meloxicam (MOBIC) 15 MG tablet Mobic 15 mg tablet  Take 1 tablet every day by oral route with meals.    . methocarbamol (ROBAXIN) 500 MG tablet Take 500 mg by mouth 2 (two) times daily.  0  . metoCLOPramide (REGLAN) 10 MG tablet Take 1 tablet (10 mg total) by mouth 3 (three) times daily before meals. 30 tablet 1  . naproxen (NAPROSYN) 500 MG tablet Take 1 tablet (500 mg total) by mouth 2 (two) times daily with a meal. 90 tablet 1  . ondansetron (ZOFRAN) 8 MG tablet Take 1 tablet (8 mg total) by mouth every 8 (eight) hours as needed for nausea or vomiting. 30 tablet 2  . ondansetron (ZOFRAN-ODT) 8 MG disintegrating tablet DISSOLVE 1 TABLET ON TONGUE EVERY 6 TO 8 HOURS AS NEEDED FOR NAUSEA OR VOMITING  0  . Oxycodone HCl 10 MG TABS Take 1 tablet (10 mg total) by mouth every 4 (four) hours as needed. 60 tablet 0  . oxyCODONE-acetaminophen (PERCOCET/ROXICET) 5-325 MG tablet Take 1-2 tablets by mouth every 4 (four) hours as needed for severe pain. 12 tablet 0  . pantoprazole (PROTONIX) 40 MG tablet TAKE 1 TABLET BY MOUTH EVERY DAY 30 tablet 5  . polyethylene glycol (MIRALAX) packet Take 17 g by mouth daily. 14 each 0  . prochlorperazine (COMPAZINE) 10 MG tablet Take 1 tablet (10 mg total) by mouth every 6 (six)  hours as needed for nausea or vomiting. 30 tablet 2  . fentaNYL (DURAGESIC - DOSED MCG/HR) 25 MCG/HR patch Place 1 patch (25 mcg total) onto the skin every 3 (three) days. 5 patch 0  . loperamide (IMODIUM) 2 MG capsule Take 2 capsules (4 mg total) by mouth as needed for diarrhea or loose stools. 60 capsule 2   No current facility-administered medications for this visit.    Facility-Administered Medications Ordered in Other Visits  Medication Dose Route Frequency Provider Last Rate Last Dose  . 0.9 %  sodium chloride infusion   Intravenous Once Lloyd Huger, MD      . 0.9 %  sodium chloride infusion   Intravenous Once Lloyd Huger, MD      . atropine injection 0.5 mg  0.5 mg Intravenous Once PRN Lloyd Huger, MD      . dexamethasone (DECADRON) injection 10 mg  10 mg Intravenous Once Lloyd Huger, MD      .  heparin lock flush 100 unit/mL  500 Units Intravenous Once Lloyd Huger, MD      . heparin lock flush 100 unit/mL  500 Units Intravenous Once Lloyd Huger, MD      . heparin lock flush 100 unit/mL  500 Units Intracatheter Once PRN Lloyd Huger, MD      . irinotecan (CAMPTOSAR) 300 mg in dextrose 5 % 500 mL chemo infusion  180 mg/m2 (Treatment Plan Recorded) Intravenous Once Lloyd Huger, MD      . palonosetron (ALOXI) injection 0.25 mg  0.25 mg Intravenous Once Lloyd Huger, MD      . panitumumab (VECTIBIX) 400 mg in sodium chloride 0.9 % 100 mL chemo infusion  6 mg/kg (Treatment Plan Recorded) Intravenous Once Lloyd Huger, MD      . sodium chloride flush (NS) 0.9 % injection 10 mL  10 mL Intravenous PRN Lloyd Huger, MD   10 mL at 08/16/16 0925    OBJECTIVE: Vitals:   04/16/18 0908  BP: 128/72  Pulse: 76  Resp: 16  Temp: 97.6 F (36.4 C)     Body mass index is 24.8 kg/m.    ECOG FS:2 - Symptomatic, <50% confined to bed  General: Well-developed, well-nourished, no acute distress. Eyes: Pink conjunctiva,  anicteric sclera. HEENT: Normocephalic, moist mucous membranes. Lungs: Clear to auscultation bilaterally. Heart: Regular rate and rhythm. No rubs, murmurs, or gallops. Abdomen: Mild tenderness, mild distention. Musculoskeletal: No edema, cyanosis, or clubbing. Neuro: Alert, answering all questions appropriately. Cranial nerves grossly intact. Skin: Mild actiniform rash on face. Psych: Normal affect.  LAB RESULTS:  Lab Results  Component Value Date   NA 134 (L) 04/16/2018   K 3.0 (L) 04/16/2018   CL 95 (L) 04/16/2018   CO2 27 04/16/2018   GLUCOSE 113 (H) 04/16/2018   BUN 7 04/16/2018   CREATININE 0.72 04/16/2018   CALCIUM 9.1 04/16/2018   PROT 6.8 04/16/2018   ALBUMIN 3.6 04/16/2018   AST 19 04/16/2018   ALT 13 04/16/2018   ALKPHOS 103 04/16/2018   BILITOT 0.8 04/16/2018   GFRNONAA >60 04/16/2018   GFRAA >60 04/16/2018    Lab Results  Component Value Date   WBC 5.7 04/16/2018   NEUTROABS 3.3 04/16/2018   HGB 12.8 04/16/2018   HCT 40.5 04/16/2018   MCV 81.5 04/16/2018   PLT 193 04/16/2018     STUDIES: US Paracentesis  Result Date: 03/27/2018 INDICATION: Patient with history of peritoneal carcinomatosis, abdominal distention, recurrent ascites. Request is made for therapeutic paracentesis. EXAM: ULTRASOUND GUIDED tHERAPEUTIC PARACENTESIS MEDICATIONS: 10 mL 1% lidocaine COMPLICATIONS: None immediate. PROCEDURE: Informed written consent was obtained from the patient after a discussion of the risks, benefits and alternatives to treatment. A timeout was performed prior to the initiation of the procedure. Initial ultrasound scanning demonstrates a moderate amount of ascites within the left lateral abdomen. The left lateral abdomen was prepped and draped in the usual sterile fashion. 1% lidocaine was used for local anesthesia. Following this, a 19 gauge, 7-cm, Yueh catheter was introduced. An ultrasound image was saved for documentation purposes. The paracentesis was performed.  The catheter was removed and a dressing was applied. The patient tolerated the procedure well without immediate post procedural complication. FINDINGS: A total of approximately 2.9 liters of dark, amber fluid was removed. IMPRESSION: Successful ultrasound-guided therapeutic paracentesis yielding 2.9 liters of peritoneal fluid. Read by: Brynda Greathouse PA-C Electronically Signed   By: Marybelle Killings M.D.   On: 03/27/2018 09:19  Dg Abd 2 Views  Result Date: 04/12/2018 CLINICAL DATA:  Abdominal pain with vomiting and diarrhea. History of peritoneal carcinomatosis. EXAM: ABDOMEN - 2 VIEW COMPARISON:  CT abdomen pelvis dated January 27, 2018. FINDINGS: Focal loop of dilated, air-filled small bowel in the left abdomen. No air-fluid levels. Air and stool are seen within the colon. There is no evidence of free air. No radio-opaque calculi or other significant radiographic abnormality is seen. IMPRESSION: 1. Mildly dilated loop of small bowel in the left abdomen, nonspecific. This could reflect enteritis or partial obstruction. Electronically Signed   By: Titus Dubin M.D.   On: 04/12/2018 16:51    ASSESSMENT: Peritoneal carcinomatosis, metastatic adenocarcinoma of likely lower GI primary.  PLAN:    1. Peritoneal carcinomatosis: Given this and her worsening symptoms, she likely has progressive disease.  Rather than enrolling on clinical trial at [facility], patient initiated palliative chemotherapy using Irinotecan and Panitumumab only.  Patient will receive treatment every 2 weeks and will reimage in approximately 3 months.  Proceed with cycle 2 of Irinotecan and Panitumumab today.  Return to clinic in 2 weeks for further evaluation and consideration of cycle 3.  Previously, [facility] testing did not reveal any actionable mutations.   2.  Ascites: Paracentesis on March 27, 2018 reviewed 2.9 L of fluid.  Likely secondary to progressive disease. 3.  Facial swelling: Resolved.  Unclear etiology, but most likely  related to Botox injections. 4.  Pain: Continue oxycodone 10 mg every 4-6 hours as needed.  Patient was also given a prescription for fentanyl patch today. 5.  Diarrhea: Patient was given a prescription for Lomotil today. 6.  Hypokalemia: Patient will receive 40 mEq IV potassium today. 7.  Poor appetite/weight loss: Appreciate dietary input. 8.  Rash: Patient was given a prescription for doxycycline topical ointment. 9.  Disposition: Short and long-term goals were discussed with palliative care.  Appreciate their input.    Patient expressed understanding and was in agreement with this plan. She also understands that She can call clinic at any time with any questions, concerns, or complaints.     Lloyd Huger, MD 04/16/18 10:17 AM

## 2018-04-16 ENCOUNTER — Inpatient Hospital Stay: Payer: BLUE CROSS/BLUE SHIELD

## 2018-04-16 ENCOUNTER — Other Ambulatory Visit: Payer: Self-pay | Admitting: *Deleted

## 2018-04-16 ENCOUNTER — Inpatient Hospital Stay (HOSPITAL_BASED_OUTPATIENT_CLINIC_OR_DEPARTMENT_OTHER): Payer: BLUE CROSS/BLUE SHIELD | Admitting: Hospice and Palliative Medicine

## 2018-04-16 ENCOUNTER — Encounter: Payer: Self-pay | Admitting: Oncology

## 2018-04-16 ENCOUNTER — Inpatient Hospital Stay (HOSPITAL_BASED_OUTPATIENT_CLINIC_OR_DEPARTMENT_OTHER): Payer: BLUE CROSS/BLUE SHIELD | Admitting: Oncology

## 2018-04-16 VITALS — BP 128/72 | HR 76 | Temp 97.6°F | Resp 16 | Wt 140.0 lb

## 2018-04-16 DIAGNOSIS — R531 Weakness: Secondary | ICD-10-CM

## 2018-04-16 DIAGNOSIS — Z85038 Personal history of other malignant neoplasm of large intestine: Secondary | ICD-10-CM

## 2018-04-16 DIAGNOSIS — C801 Malignant (primary) neoplasm, unspecified: Secondary | ICD-10-CM

## 2018-04-16 DIAGNOSIS — K219 Gastro-esophageal reflux disease without esophagitis: Secondary | ICD-10-CM

## 2018-04-16 DIAGNOSIS — R197 Diarrhea, unspecified: Secondary | ICD-10-CM

## 2018-04-16 DIAGNOSIS — R180 Malignant ascites: Secondary | ICD-10-CM

## 2018-04-16 DIAGNOSIS — F418 Other specified anxiety disorders: Secondary | ICD-10-CM | POA: Diagnosis present

## 2018-04-16 DIAGNOSIS — C786 Secondary malignant neoplasm of retroperitoneum and peritoneum: Secondary | ICD-10-CM

## 2018-04-16 DIAGNOSIS — R110 Nausea: Secondary | ICD-10-CM

## 2018-04-16 DIAGNOSIS — F419 Anxiety disorder, unspecified: Secondary | ICD-10-CM | POA: Diagnosis present

## 2018-04-16 DIAGNOSIS — F329 Major depressive disorder, single episode, unspecified: Secondary | ICD-10-CM | POA: Diagnosis present

## 2018-04-16 DIAGNOSIS — R634 Abnormal weight loss: Secondary | ICD-10-CM | POA: Diagnosis present

## 2018-04-16 DIAGNOSIS — G893 Neoplasm related pain (acute) (chronic): Secondary | ICD-10-CM

## 2018-04-16 DIAGNOSIS — R5382 Chronic fatigue, unspecified: Secondary | ICD-10-CM

## 2018-04-16 DIAGNOSIS — R109 Unspecified abdominal pain: Secondary | ICD-10-CM

## 2018-04-16 DIAGNOSIS — Z79899 Other long term (current) drug therapy: Secondary | ICD-10-CM

## 2018-04-16 DIAGNOSIS — Z87891 Personal history of nicotine dependence: Secondary | ICD-10-CM

## 2018-04-16 DIAGNOSIS — Z515 Encounter for palliative care: Secondary | ICD-10-CM | POA: Diagnosis present

## 2018-04-16 DIAGNOSIS — E876 Hypokalemia: Secondary | ICD-10-CM

## 2018-04-16 DIAGNOSIS — E785 Hyperlipidemia, unspecified: Secondary | ICD-10-CM

## 2018-04-16 DIAGNOSIS — M1990 Unspecified osteoarthritis, unspecified site: Secondary | ICD-10-CM

## 2018-04-16 DIAGNOSIS — K56600 Partial intestinal obstruction, unspecified as to cause: Secondary | ICD-10-CM | POA: Diagnosis present

## 2018-04-16 DIAGNOSIS — D709 Neutropenia, unspecified: Secondary | ICD-10-CM | POA: Diagnosis present

## 2018-04-16 DIAGNOSIS — R21 Rash and other nonspecific skin eruption: Secondary | ICD-10-CM

## 2018-04-16 DIAGNOSIS — Z66 Do not resuscitate: Secondary | ICD-10-CM | POA: Diagnosis present

## 2018-04-16 DIAGNOSIS — R5081 Fever presenting with conditions classified elsewhere: Secondary | ICD-10-CM | POA: Diagnosis present

## 2018-04-16 DIAGNOSIS — R5381 Other malaise: Secondary | ICD-10-CM

## 2018-04-16 DIAGNOSIS — R630 Anorexia: Secondary | ICD-10-CM

## 2018-04-16 LAB — CBC WITH DIFFERENTIAL/PLATELET
ABS IMMATURE GRANULOCYTES: 0.03 10*3/uL (ref 0.00–0.07)
BASOS ABS: 0 10*3/uL (ref 0.0–0.1)
BASOS PCT: 1 %
Eosinophils Absolute: 0.1 10*3/uL (ref 0.0–0.5)
Eosinophils Relative: 2 %
HCT: 40.5 % (ref 36.0–46.0)
HEMOGLOBIN: 12.8 g/dL (ref 12.0–15.0)
Immature Granulocytes: 1 %
LYMPHS PCT: 24 %
Lymphs Abs: 1.4 10*3/uL (ref 0.7–4.0)
MCH: 25.8 pg — ABNORMAL LOW (ref 26.0–34.0)
MCHC: 31.6 g/dL (ref 30.0–36.0)
MCV: 81.5 fL (ref 80.0–100.0)
Monocytes Absolute: 0.8 10*3/uL (ref 0.1–1.0)
Monocytes Relative: 15 %
NEUTROS ABS: 3.3 10*3/uL (ref 1.7–7.7)
Neutrophils Relative %: 57 %
PLATELETS: 193 10*3/uL (ref 150–400)
RBC: 4.97 MIL/uL (ref 3.87–5.11)
RDW: 16 % — ABNORMAL HIGH (ref 11.5–15.5)
WBC: 5.7 10*3/uL (ref 4.0–10.5)
nRBC: 0 % (ref 0.0–0.2)

## 2018-04-16 LAB — COMPREHENSIVE METABOLIC PANEL
ALT: 13 U/L (ref 0–44)
AST: 19 U/L (ref 15–41)
Albumin: 3.6 g/dL (ref 3.5–5.0)
Alkaline Phosphatase: 103 U/L (ref 38–126)
Anion gap: 12 (ref 5–15)
BUN: 7 mg/dL (ref 6–20)
CHLORIDE: 95 mmol/L — AB (ref 98–111)
CO2: 27 mmol/L (ref 22–32)
Calcium: 9.1 mg/dL (ref 8.9–10.3)
Creatinine, Ser: 0.72 mg/dL (ref 0.44–1.00)
Glucose, Bld: 113 mg/dL — ABNORMAL HIGH (ref 70–99)
POTASSIUM: 3 mmol/L — AB (ref 3.5–5.1)
Sodium: 134 mmol/L — ABNORMAL LOW (ref 135–145)
TOTAL PROTEIN: 6.8 g/dL (ref 6.5–8.1)
Total Bilirubin: 0.8 mg/dL (ref 0.3–1.2)

## 2018-04-16 LAB — MAGNESIUM: MAGNESIUM: 1.7 mg/dL (ref 1.7–2.4)

## 2018-04-16 MED ORDER — SODIUM CHLORIDE 0.9 % IV SOLN
6.0000 mg/kg | Freq: Once | INTRAVENOUS | Status: AC
  Administered 2018-04-16: 400 mg via INTRAVENOUS
  Filled 2018-04-16: qty 20

## 2018-04-16 MED ORDER — SODIUM CHLORIDE 0.9 % IV SOLN
INTRAVENOUS | Status: DC
  Administered 2018-04-16: 14:00:00 via INTRAVENOUS
  Filled 2018-04-16 (×2): qty 500

## 2018-04-16 MED ORDER — DEXAMETHASONE SODIUM PHOSPHATE 10 MG/ML IJ SOLN
10.0000 mg | Freq: Once | INTRAMUSCULAR | Status: AC
  Administered 2018-04-16: 10 mg via INTRAVENOUS
  Filled 2018-04-16: qty 1

## 2018-04-16 MED ORDER — SODIUM CHLORIDE 0.9 % IV SOLN
Freq: Once | INTRAVENOUS | Status: AC
  Administered 2018-04-16: 10:00:00 via INTRAVENOUS
  Filled 2018-04-16: qty 250

## 2018-04-16 MED ORDER — PALONOSETRON HCL INJECTION 0.25 MG/5ML
0.2500 mg | Freq: Once | INTRAVENOUS | Status: AC
  Administered 2018-04-16: 0.25 mg via INTRAVENOUS
  Filled 2018-04-16: qty 5

## 2018-04-16 MED ORDER — IRINOTECAN HCL CHEMO INJECTION 100 MG/5ML
180.0000 mg/m2 | Freq: Once | INTRAVENOUS | Status: AC
  Administered 2018-04-16: 300 mg via INTRAVENOUS
  Filled 2018-04-16: qty 15

## 2018-04-16 MED ORDER — HEPARIN SOD (PORK) LOCK FLUSH 100 UNIT/ML IV SOLN: 500.0000 [IU] | Freq: Once | INTRAVENOUS | Status: DC | PRN

## 2018-04-16 MED ORDER — CLINDAMYCIN PHOSPHATE 1 % EX GEL: Freq: Two times a day (BID) | CUTANEOUS | 1 refills | Status: AC

## 2018-04-16 MED ORDER — SODIUM CHLORIDE 0.9% FLUSH
10.0000 mL | Freq: Once | INTRAVENOUS | Status: AC
  Administered 2018-04-16: 10 mL via INTRAVENOUS
  Filled 2018-04-16: qty 10

## 2018-04-16 MED ORDER — SODIUM CHLORIDE 0.9 % IV SOLN: 40.0000 meq | Freq: Once | INTRAVENOUS | Status: DC

## 2018-04-16 MED ORDER — LOPERAMIDE HCL 2 MG PO CAPS: 4.0000 mg | ORAL_CAPSULE | ORAL | 2 refills | Status: AC | PRN

## 2018-04-16 MED ORDER — HEPARIN SOD (PORK) LOCK FLUSH 100 UNIT/ML IV SOLN
500.0000 [IU] | Freq: Once | INTRAVENOUS | Status: AC
  Administered 2018-04-16: 500 [IU] via INTRAVENOUS
  Filled 2018-04-16: qty 5

## 2018-04-16 MED ORDER — ATROPINE SULFATE 1 MG/ML IJ SOLN
0.5000 mg | Freq: Once | INTRAMUSCULAR | Status: AC | PRN
  Administered 2018-04-16: 0.5 mg via INTRAVENOUS
  Filled 2018-04-16: qty 1

## 2018-04-16 MED ORDER — FENTANYL 25 MCG/HR TD PT72: 25.0000 ug | MEDICATED_PATCH | TRANSDERMAL | 0 refills | Status: DC

## 2018-04-16 NOTE — Progress Notes (Signed)
[facility]  [phone] [phone]   Name: Valerie Horton Date: 04/16/2018 MRN: [id number]  DOB: [birth year]  Patient Care Team: Valerie Roys, DO as PCP - General (Family Medicine) Clent Jacks, RN as Registered Nurse Garnetta Buddy, MD as Consulting Physician (Internal Medicine)    [place]: Palliative Care consult requested for this 57 y.o. female for goals of medical treatment in patient with multiple medical problems including stage IV peritoneal carcinomatosis initially diagnosed in July 2017. She is status post 12 cycles of FOLFOX (completed January 2018) with subsequent disease progression.  She was treated with 18 cycles of FOLFIRI beginning January 2018 and ending October 2019.  She restarted FOLFIRI July 2019 but had poor tolerance.  She is now being treated with irinotecan and Panitumumab.  Unfortunately patient has had evidence of progressive disease with malignant ascites requiring recent therapeutic paracentesis, poor oral intake, and subsequent weight loss.  Palliative care has been asked to help with symptom management, patient/family support, and to help address treatment goals.   SOCIAL HISTORY:   Patient lives at home with her husband and adult daughter.  She has two other adult children who do not live at home.  Patient formally worked as a Copywriter, advertising.  ADVANCE DIRECTIVES:  Patient's husband is her healthcare power of attorney.  Patient has completed a living will.  CODE STATUS: DNR  PAST MEDICAL HISTORY: Past Medical History:  Diagnosis Date  . Acid reflux   . Allergic rhinitis   . Anxiety   . Arthritis    left knee  . Cervical spondylosis   . CTS (carpal tunnel syndrome)    Right  . Diverticulosis   . HSV-2 (herpes simplex virus 2) infection   . Hyperlipidemia   . Ovarian failure   . Peritoneal carcinomatosis ([place]) 01/2016   chemo tx's.   . Rosacea   .  Wears contact lenses     PAST SURGICAL HISTORY:  Past Surgical History:  Procedure Laterality Date  . ANTERIOR CRUCIATE LIGAMENT REPAIR Left   . CESAREAN SECTION    . COLONOSCOPY WITH PROPOFOL N/A 01/29/2016   Procedure: COLONOSCOPY WITH PROPOFOL;  Surgeon: Lucilla Lame, MD;  Location: [facility];  Service: Endoscopy;  Laterality: N/A;  . COLONOSCOPY WITH PROPOFOL N/A 01/01/2018   Procedure: COLONOSCOPY WITH PROPOFOL;  Surgeon: Lucilla Lame, MD;  Location: [facility];  Service: Endoscopy;  Laterality: N/A;  . ESOPHAGOGASTRODUODENOSCOPY (EGD) WITH PROPOFOL N/A 01/29/2016   Procedure: ESOPHAGOGASTRODUODENOSCOPY (EGD) WITH PROPOFOL;  Surgeon: Lucilla Lame, MD;  Location: [facility];  Service: Endoscopy;  Laterality: N/A;  . ESOPHAGOGASTRODUODENOSCOPY (EGD) WITH PROPOFOL N/A 01/01/2018   Procedure: ESOPHAGOGASTRODUODENOSCOPY (EGD) WITH PROPOFOL;  Surgeon: Lucilla Lame, MD;  Location: [facility];  Service: Endoscopy;  Laterality: N/A;  . PERIPHERAL VASCULAR CATHETERIZATION N/A 02/10/2016   Procedure: Glori Luis Cath Insertion;  Surgeon: Algernon Huxley, MD;  Location: [facility];  Service: Cardiovascular;  Laterality: N/A;    HEMATOLOGY/ONCOLOGY HISTORY:  Oncology History   Patient with initial diagnosis in July 2017 where she presented to the emergency room for abdominal pain, bloating and fullness.  She was found to have ascites and a paracentesis was performed.  Ca 125 was elevated.  Omental biopsy was performed along with testing of the fluid from recent paracentesis.  Pathology revealed lower GI primary.  She was started on FOLFOX for 12 cycles.  Compleated 11 cycles on 07/21/2016.  Progression  of disease was noted and therapy was switched. Began FOLFIRI in January 2018.  Received 18 cycles completing in October 2018.  She required ultrasound-guided paracentesis more frequient.  She was not a surgical candidate. Foundation one testing did not reveal any actionable  mutations.  Restarted FOLFIRI chemotherapy in July 2019 with poor tolerance.      Peritoneal carcinomatosis ([place])   01/20/2016 Initial Diagnosis    Peritoneal carcinomatosis ([place])    03/30/2018 -  Chemotherapy    The patient had palonosetron (ALOXI) injection 0.25 mg, 0.25 mg, Intravenous,  Once, 2 of 6 cycles Administration: 0.25 mg (04/02/2018) irinotecan (CAMPTOSAR) 300 mg in dextrose 5 % 500 mL chemo infusion, 180 mg/m2 = 300 mg, Intravenous,  Once, 2 of 6 cycles Administration: 300 mg (04/02/2018) panitumumab (VECTIBIX) 400 mg in sodium chloride 0.9 % 100 mL chemo infusion, 6 mg/kg = 400 mg, Intravenous,  Once, 2 of 6 cycles Administration: 400 mg (04/02/2018)  for chemotherapy treatment.      ALLERGIES:  has No Known Allergies.  MEDICATIONS:  Current Outpatient Medications  Medication Sig Dispense Refill  . acyclovir (ZOVIRAX) 800 MG tablet TAKE 1 TABLET BY MOUTH EVERY DAY 90 tablet 1  . Cholecalciferol 1000 units capsule Take 1,000 Units by mouth daily.     . diphenhydrAMINE (BENADRYL) 25 MG tablet Take 25 mg by mouth every 6 (six) hours as needed.    Marland Kitchen escitalopram (LEXAPRO) 10 MG tablet TAKE 1 TABLET BY MOUTH EVERY DAY 90 tablet 1  . fentaNYL (DURAGESIC - DOSED MCG/HR) 25 MCG/HR patch Place 1 patch (25 mcg total) onto the skin every 3 (three) days. 5 patch 0  . fluticasone (FLONASE) 50 MCG/ACT nasal spray Place into both nostrils daily.    Marland Kitchen gabapentin (NEURONTIN) 300 MG capsule Take 300 mg by mouth at bedtime.    Marland Kitchen HYDROcodone-acetaminophen (NORCO/VICODIN) 5-325 MG tablet TAKE 1 TABLET BY MOUTH EVERY 4 TO 6 HOURS AS NEEDED FOR PAIN WITH FOOD  0  . lidocaine-prilocaine (EMLA) cream Apply 1 application topically as needed. 30 g 3  . loperamide (IMODIUM) 2 MG capsule Take 2 capsules (4 mg total) by mouth as needed for diarrhea or loose stools. 60 capsule 2  . meloxicam (MOBIC) 15 MG tablet Mobic 15 mg tablet  Take 1 tablet every day by oral route with meals.    . methocarbamol  (ROBAXIN) 500 MG tablet Take 500 mg by mouth 2 (two) times daily.  0  . metoCLOPramide (REGLAN) 10 MG tablet Take 1 tablet (10 mg total) by mouth 3 (three) times daily before meals. 30 tablet 1  . naproxen (NAPROSYN) 500 MG tablet Take 1 tablet (500 mg total) by mouth 2 (two) times daily with a meal. 90 tablet 1  . ondansetron (ZOFRAN) 8 MG tablet Take 1 tablet (8 mg total) by mouth every 8 (eight) hours as needed for nausea or vomiting. 30 tablet 2  . ondansetron (ZOFRAN-ODT) 8 MG disintegrating tablet DISSOLVE 1 TABLET ON TONGUE EVERY 6 TO 8 HOURS AS NEEDED FOR NAUSEA OR VOMITING  0  . Oxycodone HCl 10 MG TABS Take 1 tablet (10 mg total) by mouth every 4 (four) hours as needed. 60 tablet 0  . oxyCODONE-acetaminophen (PERCOCET/ROXICET) 5-325 MG tablet Take 1-2 tablets by mouth every 4 (four) hours as needed for severe pain. 12 tablet 0  . pantoprazole (PROTONIX) 40 MG tablet TAKE 1 TABLET BY MOUTH EVERY DAY 30 tablet 5  . polyethylene glycol (MIRALAX) packet Take 17 g by mouth  daily. 14 each 0  . prochlorperazine (COMPAZINE) 10 MG tablet Take 1 tablet (10 mg total) by mouth every 6 (six) hours as needed for nausea or vomiting. 30 tablet 2   No current facility-administered medications for this visit.    Facility-Administered Medications Ordered in Other Visits  Medication Dose Route Frequency Provider Last Rate Last Dose  . 0.9 %  sodium chloride infusion   Intravenous Once Lloyd Huger, MD      . heparin lock flush 100 unit/mL  500 Units Intravenous Once Lloyd Huger, MD      . heparin lock flush 100 unit/mL  500 Units Intravenous Once Lloyd Huger, MD      . heparin lock flush 100 unit/mL  500 Units Intracatheter Once PRN Lloyd Huger, MD      . irinotecan (CAMPTOSAR) 300 mg in dextrose 5 % 500 mL chemo infusion  180 mg/m2 (Treatment Plan Recorded) Intravenous Once Lloyd Huger, MD      . panitumumab (VECTIBIX) 400 mg in sodium chloride 0.9 % 100 mL chemo  infusion  6 mg/kg (Treatment Plan Recorded) Intravenous Once Lloyd Huger, MD 120 mL/hr at 04/16/18 1100 400 mg at 04/16/18 1100  . sodium chloride 0.9 % 500 mL with potassium chloride 40 mEq infusion   Intravenous Continuous Lloyd Huger, MD      . sodium chloride flush (NS) 0.9 % injection 10 mL  10 mL Intravenous PRN Lloyd Huger, MD   10 mL at 08/16/16 7846    VITAL SIGNS: There were no vitals taken for this visit. There were no vitals filed for this visit.  Estimated body mass index is 24.8 kg/m as calculated from the following:   Height as of 04/09/18: 5\' 3"  (1.6 m).   Weight as of an earlier encounter on 04/16/18: 140 lb (63.5 kg).  LABS: CBC:    Component Value Date/Time   WBC 5.7 04/16/2018 0838   HGB 12.8 04/16/2018 0838   HGB 12.0 09/28/2017 1417   HCT 40.5 04/16/2018 0838   HCT 35.3 09/28/2017 1417   PLT 193 04/16/2018 0838   PLT 309 09/28/2017 1417   MCV 81.5 04/16/2018 0838   MCV 84 09/28/2017 1417   NEUTROABS 3.3 04/16/2018 0838   NEUTROABS 3.9 09/28/2017 1417   LYMPHSABS 1.4 04/16/2018 0838   LYMPHSABS 1.7 09/28/2017 1417   MONOABS 0.8 04/16/2018 0838   EOSABS 0.1 04/16/2018 0838   EOSABS 0.0 09/28/2017 1417   BASOSABS 0.0 04/16/2018 0838   BASOSABS 0.0 09/28/2017 1417   Comprehensive Metabolic Panel:    Component Value Date/Time   NA 134 (L) 04/16/2018 0838   NA 139 09/28/2017 1417   K 3.0 (L) 04/16/2018 0838   CL 95 (L) 04/16/2018 0838   CO2 27 04/16/2018 0838   BUN 7 04/16/2018 0838   BUN 17 09/28/2017 1417   CREATININE 0.72 04/16/2018 0838   GLUCOSE 113 (H) 04/16/2018 0838   CALCIUM 9.1 04/16/2018 0838   AST 19 04/16/2018 0838   ALT 13 04/16/2018 0838   ALKPHOS 103 04/16/2018 0838   BILITOT 0.8 04/16/2018 0838   BILITOT <0.2 09/28/2017 1417   PROT 6.8 04/16/2018 0838   PROT 6.7 09/28/2017 1417   ALBUMIN 3.6 04/16/2018 0838   ALBUMIN 3.8 09/28/2017 1417    RADIOGRAPHIC STUDIES: US Paracentesis  Result Date:  03/27/2018 INDICATION: Patient with history of peritoneal carcinomatosis, abdominal distention, recurrent ascites. Request is made for therapeutic paracentesis. EXAM: ULTRASOUND GUIDED tHERAPEUTIC PARACENTESIS  MEDICATIONS: 10 mL 1% lidocaine COMPLICATIONS: None immediate. PROCEDURE: Informed written consent was obtained from the patient after a discussion of the risks, benefits and alternatives to treatment. A timeout was performed prior to the initiation of the procedure. Initial ultrasound scanning demonstrates a moderate amount of ascites within the left lateral abdomen. The left lateral abdomen was prepped and draped in the usual sterile fashion. 1% lidocaine was used for local anesthesia. Following this, a 19 gauge, 7-cm, Yueh catheter was introduced. An ultrasound image was saved for documentation purposes. The paracentesis was performed. The catheter was removed and a dressing was applied. The patient tolerated the procedure well without immediate post procedural complication. FINDINGS: A total of approximately 2.9 liters of dark, amber fluid was removed. IMPRESSION: Successful ultrasound-guided therapeutic paracentesis yielding 2.9 liters of peritoneal fluid. Read by: Brynda Greathouse PA-C Electronically Signed   By: Marybelle Killings M.D.   On: 03/27/2018 09:19   Dg Abd 2 Views  Result Date: 04/12/2018 CLINICAL DATA:  Abdominal pain with vomiting and diarrhea. History of peritoneal carcinomatosis. EXAM: ABDOMEN - 2 VIEW COMPARISON:  CT abdomen pelvis dated January 27, 2018. FINDINGS: Focal loop of dilated, air-filled small bowel in the left abdomen. No air-fluid levels. Air and stool are seen within the colon. There is no evidence of free air. No radio-opaque calculi or other significant radiographic abnormality is seen. IMPRESSION: 1. Mildly dilated loop of small bowel in the left abdomen, nonspecific. This could reflect enteritis or partial obstruction. Electronically Signed   By: Titus Dubin M.D.   On:  04/12/2018 16:51    PERFORMANCE STATUS (ECOG) : 3 - Symptomatic, >50% confined to bed  Review of Systems As noted above. Otherwise, a complete review of systems is negative.  Physical Exam General: thin, frail appearing  Lungs: Clear to auscultation bilaterally. Heart: Regular rate and rhythm. Abdomen: Soft, nontender, BS hypoactive Musculoskeletal: No edema Neuro: Alert, answering all questions appropriately. Cranial nerves grossly intact.  IMPRESSION: She has had persistent upper abdominal pain.  Percocet was switched to oxycodone 10 mg, which patient has been taking regularly without significant relief.  She was started today on transdermal fentanyl 25 mcg every 72 hours by Dr. Grayland Ormond.  Agree with this plan we will continue to titrate both short-acting and long-acting opioids as tolerated.  I discussed with patient the action of both medications and that long-acting opioids should help stabilize pain but the patient may continue to need the oxycodone for breakthrough pain.  Patient should not expect the transdermal fentanyl to be efficacious for first 18 to 24 hours following application.  I also discussed with patient the possible referral to interventional pain management for consideration of a celiac and/or hypogastric nerve block.  Patient describes abdominal pain as being primarily located in a band across her upper abdomen.  The localized nature of the pain may be amenable to intervention.  Patient continues to have diarrhea and was started today on Lomotil.  She continues to receive chemotherapy.  PLAN: 1. Agree with starting transdermal fentanyl. Continue oxycodone prn for BTP 2. Consider referral to her interventional pain management   Patient expressed understanding and was in agreement with this plan. She also understands that She can call clinic at any time with any questions, concerns, or complaints.     Time Total: 15 minutes  Visit consisted of counseling and  education dealing with the complex and emotionally intense issues of symptom management and palliative care in the setting of serious and potentially life-threatening illness.Greater  than 50%  of this time was spent counseling and coordinating care related to the above assessment and plan.  Signed by: Altha Harm, [place], NP-C, [place] (Work Cell)

## 2018-04-16 NOTE — Progress Notes (Signed)
Nutrition Assessment   Reason for Assessment:   Poor appetite, weight loss   ASSESSMENT:   57 year old female with stage IV peritoneal Carcinomatosis.  Patient with progressive disease with malignant ascites requiring therapeutic paracentesis.  Patient being followed by palliative care.    Met with patient and husband during infusion this am.  Patient reports that she has a fear of eating due to abdominal pain. Reports that pain medication are being adjusted.  Reports that she drinks 1 orgain nutrition shake per day.  Mostly taking bites of foods.  Reports although yesterday had a craving for olives and was able to eat good amount of olives today. Reports diarrhea but stopping reglan today.  Reports vomiting at times just out of the blue   Nutrition Focused Physical Exam: deferred   Medications: reviewed   Labs: Na 134, K 3.0, glucose 113   Anthropometrics:   Height: 63 inches Weight: 140 lb UBW: 159 lb in 7/10 BMI: 25  12% weight loss in the last 3 months   Estimated Energy Needs  Kcals: 4709-2957 calories/d Protein: 96-112 g/d Fluid: 2.2 L/d   NUTRITION DIAGNOSIS: Inadequate oral intake related to abdominal pain, fear as evidenced by poor po intake and 12% weight loss    INTERVENTION:  Discussed high calorie, high protein foods.  Encouraged small frequent meals. Encouraged increasing orgain shake to BID if able. Provided sample of [employer] shake to try as well as recipes for high calorie shakes   MONITORING, EVALUATION, GOAL: weight trends, intake   Next Visit: Monday, October 28  Donovon Micheletti B. Zenia Resides, [place], [place] Registered Dietitian [phone] (pager)

## 2018-04-16 NOTE — Progress Notes (Signed)
Pt in for follow up, reports no changes, states "everything should be up to date on mychart for here and [facility]."

## 2018-04-16 NOTE — Progress Notes (Signed)
Dr. Grayland Ormond gave order to add 40 meq potassium for today's treatment, infuse over 2 hours.

## 2018-04-18 ENCOUNTER — Encounter: Payer: Self-pay | Admitting: *Deleted

## 2018-04-18 ENCOUNTER — Encounter: Payer: Self-pay | Admitting: Oncology

## 2018-04-19 ENCOUNTER — Inpatient Hospital Stay: Payer: BLUE CROSS/BLUE SHIELD

## 2018-04-19 ENCOUNTER — Inpatient Hospital Stay (HOSPITAL_BASED_OUTPATIENT_CLINIC_OR_DEPARTMENT_OTHER): Payer: BLUE CROSS/BLUE SHIELD | Admitting: Hospice and Palliative Medicine

## 2018-04-19 ENCOUNTER — Inpatient Hospital Stay (HOSPITAL_BASED_OUTPATIENT_CLINIC_OR_DEPARTMENT_OTHER): Payer: BLUE CROSS/BLUE SHIELD | Admitting: Oncology

## 2018-04-19 ENCOUNTER — Ambulatory Visit: Payer: BLUE CROSS/BLUE SHIELD | Admitting: Hospice and Palliative Medicine

## 2018-04-19 ENCOUNTER — Inpatient Hospital Stay
Admission: AD | Admit: 2018-04-19 | Discharge: 2018-04-25 | DRG: 375 | Disposition: A | Discharge status: Hospice | Payer: BLUE CROSS/BLUE SHIELD | Source: Ambulatory Visit | Attending: Internal Medicine | Admitting: Internal Medicine

## 2018-04-19 ENCOUNTER — Other Ambulatory Visit: Payer: Self-pay

## 2018-04-19 ENCOUNTER — Inpatient Hospital Stay: Payer: BLUE CROSS/BLUE SHIELD | Admitting: Oncology

## 2018-04-19 ENCOUNTER — Encounter: Payer: Self-pay | Admitting: Oncology

## 2018-04-19 VITALS — BP 126/79 | HR 105 | Temp 100.1°F | Resp 20

## 2018-04-19 DIAGNOSIS — C786 Secondary malignant neoplasm of retroperitoneum and peritoneum: Secondary | ICD-10-CM | POA: Diagnosis present

## 2018-04-19 DIAGNOSIS — F418 Other specified anxiety disorders: Secondary | ICD-10-CM | POA: Diagnosis present

## 2018-04-19 DIAGNOSIS — C801 Malignant (primary) neoplasm, unspecified: Secondary | ICD-10-CM

## 2018-04-19 DIAGNOSIS — C482 Malignant neoplasm of peritoneum, unspecified: Secondary | ICD-10-CM | POA: Diagnosis not present

## 2018-04-19 DIAGNOSIS — R634 Abnormal weight loss: Secondary | ICD-10-CM | POA: Diagnosis present

## 2018-04-19 DIAGNOSIS — F419 Anxiety disorder, unspecified: Secondary | ICD-10-CM | POA: Diagnosis present

## 2018-04-19 DIAGNOSIS — R5081 Fever presenting with conditions classified elsewhere: Secondary | ICD-10-CM | POA: Diagnosis present

## 2018-04-19 DIAGNOSIS — Z87891 Personal history of nicotine dependence: Secondary | ICD-10-CM

## 2018-04-19 DIAGNOSIS — R5383 Other fatigue: Secondary | ICD-10-CM

## 2018-04-19 DIAGNOSIS — R112 Nausea with vomiting, unspecified: Secondary | ICD-10-CM | POA: Diagnosis not present

## 2018-04-19 DIAGNOSIS — R701 Abnormal plasma viscosity: Secondary | ICD-10-CM | POA: Diagnosis not present

## 2018-04-19 DIAGNOSIS — R509 Fever, unspecified: Secondary | ICD-10-CM

## 2018-04-19 DIAGNOSIS — Z515 Encounter for palliative care: Secondary | ICD-10-CM | POA: Diagnosis present

## 2018-04-19 DIAGNOSIS — R531 Weakness: Secondary | ICD-10-CM

## 2018-04-19 DIAGNOSIS — R140 Abdominal distension (gaseous): Secondary | ICD-10-CM | POA: Diagnosis not present

## 2018-04-19 DIAGNOSIS — G893 Neoplasm related pain (acute) (chronic): Secondary | ICD-10-CM

## 2018-04-19 DIAGNOSIS — R109 Unspecified abdominal pain: Secondary | ICD-10-CM

## 2018-04-19 DIAGNOSIS — D709 Neutropenia, unspecified: Secondary | ICD-10-CM | POA: Diagnosis present

## 2018-04-19 DIAGNOSIS — M1990 Unspecified osteoarthritis, unspecified site: Secondary | ICD-10-CM

## 2018-04-19 DIAGNOSIS — T451X5S Adverse effect of antineoplastic and immunosuppressive drugs, sequela: Secondary | ICD-10-CM

## 2018-04-19 DIAGNOSIS — R5381 Other malaise: Secondary | ICD-10-CM

## 2018-04-19 DIAGNOSIS — Z79899 Other long term (current) drug therapy: Secondary | ICD-10-CM

## 2018-04-19 DIAGNOSIS — E860 Dehydration: Secondary | ICD-10-CM

## 2018-04-19 DIAGNOSIS — R180 Malignant ascites: Secondary | ICD-10-CM | POA: Diagnosis present

## 2018-04-19 DIAGNOSIS — K56600 Partial intestinal obstruction, unspecified as to cause: Secondary | ICD-10-CM | POA: Diagnosis present

## 2018-04-19 DIAGNOSIS — F329 Major depressive disorder, single episode, unspecified: Secondary | ICD-10-CM | POA: Diagnosis present

## 2018-04-19 DIAGNOSIS — Z0189 Encounter for other specified special examinations: Secondary | ICD-10-CM

## 2018-04-19 DIAGNOSIS — R110 Nausea: Secondary | ICD-10-CM

## 2018-04-19 DIAGNOSIS — K219 Gastro-esophageal reflux disease without esophagitis: Secondary | ICD-10-CM

## 2018-04-19 DIAGNOSIS — K5900 Constipation, unspecified: Secondary | ICD-10-CM

## 2018-04-19 DIAGNOSIS — E785 Hyperlipidemia, unspecified: Secondary | ICD-10-CM

## 2018-04-19 DIAGNOSIS — Z85038 Personal history of other malignant neoplasm of large intestine: Secondary | ICD-10-CM | POA: Diagnosis not present

## 2018-04-19 DIAGNOSIS — Z66 Do not resuscitate: Secondary | ICD-10-CM | POA: Diagnosis present

## 2018-04-19 LAB — COMPREHENSIVE METABOLIC PANEL
ALK PHOS: 93 U/L (ref 38–126)
ALT: 18 U/L (ref 0–44)
ANION GAP: 12 (ref 5–15)
AST: 19 U/L (ref 15–41)
Albumin: 3.4 g/dL — ABNORMAL LOW (ref 3.5–5.0)
BILIRUBIN TOTAL: 1.3 mg/dL — AB (ref 0.3–1.2)
BUN: 14 mg/dL (ref 6–20)
CALCIUM: 8.8 mg/dL — AB (ref 8.9–10.3)
CO2: 25 mmol/L (ref 22–32)
Chloride: 98 mmol/L (ref 98–111)
Creatinine, Ser: 0.67 mg/dL (ref 0.44–1.00)
GFR calc Af Amer: >60 mL/min (ref >60)
GLUCOSE: 166 mg/dL — AB (ref 70–99)
POTASSIUM: 3.7 mmol/L (ref 3.5–5.1)
Sodium: 135 mmol/L (ref 135–145)
TOTAL PROTEIN: 6.6 g/dL (ref 6.5–8.1)

## 2018-04-19 LAB — CBC WITH DIFFERENTIAL/PLATELET
Abs Immature Granulocytes: 0 10*3/uL (ref 0.00–0.07)
Basophils Absolute: 0 10*3/uL (ref 0.0–0.1)
Basophils Relative: 1 %
EOS ABS: 0 10*3/uL (ref 0.0–0.5)
Eosinophils Relative: 0 %
HEMATOCRIT: 38.8 % (ref 36.0–46.0)
HEMOGLOBIN: 12.2 g/dL (ref 12.0–15.0)
Immature Granulocytes: 0 %
Lymphocytes Relative: 29 %
Lymphs Abs: 0.3 10*3/uL — ABNORMAL LOW (ref 0.7–4.0)
MCH: 25.7 pg — AB (ref 26.0–34.0)
MCHC: 31.4 g/dL (ref 30.0–36.0)
MCV: 81.7 fL (ref 80.0–100.0)
MONO ABS: 0.1 10*3/uL (ref 0.1–1.0)
MONOS PCT: 9 %
NEUTROS PCT: 61 %
Neutro Abs: 0.7 10*3/uL — ABNORMAL LOW (ref 1.7–7.7)
Platelets: 126 10*3/uL — ABNORMAL LOW (ref 150–400)
RBC: 4.75 MIL/uL (ref 3.87–5.11)
RDW: 16.6 % — AB (ref 11.5–15.5)
WBC: 1.1 10*3/uL — AB (ref 4.0–10.5)
nRBC: 0 % (ref 0.0–0.2)

## 2018-04-19 LAB — MAGNESIUM: Magnesium: 2 mg/dL (ref 1.7–2.4)

## 2018-04-19 MED ORDER — MORPHINE SULFATE (PF) 2 MG/ML IV SOLN
INTRAVENOUS | Status: AC
  Filled 2018-04-19: qty 1

## 2018-04-19 MED ORDER — MORPHINE SULFATE 2 MG/ML IJ SOLN
1.0000 mg | Freq: Once | INTRAMUSCULAR | Status: AC
  Administered 2018-04-19: 1 mg via INTRAVENOUS
  Filled 2018-04-19: qty 0.5

## 2018-04-19 MED ORDER — CLINDAMYCIN PHOSPHATE 1 % EX GEL: Freq: Two times a day (BID) | CUTANEOUS | Status: DC

## 2018-04-19 MED ORDER — DEXTROSE-NACL 5-0.45 % IV SOLN
INTRAVENOUS | Status: DC
  Administered 2018-04-19 – 2018-04-24 (×11): via INTRAVENOUS

## 2018-04-19 MED ORDER — SODIUM CHLORIDE 0.9% FLUSH
10.0000 mL | Freq: Once | INTRAVENOUS | Status: AC
  Administered 2018-04-19: 10 mL via INTRAVENOUS
  Filled 2018-04-19: qty 10

## 2018-04-19 MED ORDER — FLUTICASONE PROPIONATE 50 MCG/ACT NA SUSP: 2.0000 | Freq: Every day | NASAL | Status: DC

## 2018-04-19 MED ORDER — PROMETHAZINE HCL 25 MG PO TABS
12.5000 mg | ORAL_TABLET | Freq: Four times a day (QID) | ORAL | Status: DC | PRN
  Administered 2018-04-20 (×2): 12.5 mg via ORAL
  Filled 2018-04-19 (×3): qty 1

## 2018-04-19 MED ORDER — OXYCODONE HCL 5 MG PO TABS
10.0000 mg | ORAL_TABLET | ORAL | Status: DC | PRN
  Administered 2018-04-19 – 2018-04-20 (×2): 10 mg via ORAL
  Filled 2018-04-19 (×2): qty 2

## 2018-04-19 MED ORDER — LOPERAMIDE HCL 2 MG PO CAPS
4.0000 mg | ORAL_CAPSULE | ORAL | Status: DC | PRN
  Administered 2018-04-24 – 2018-04-25 (×3): 4 mg via ORAL
  Filled 2018-04-19 (×3): qty 2

## 2018-04-19 MED ORDER — MELOXICAM 7.5 MG PO TABS
15.0000 mg | ORAL_TABLET | Freq: Every day | ORAL | Status: DC
  Filled 2018-04-19 (×6): qty 2

## 2018-04-19 MED ORDER — DEXAMETHASONE SODIUM PHOSPHATE 10 MG/ML IJ SOLN
10.0000 mg | Freq: Once | INTRAMUSCULAR | Status: AC
  Administered 2018-04-19: 10 mg via INTRAVENOUS
  Filled 2018-04-19: qty 1

## 2018-04-19 MED ORDER — POLYETHYLENE GLYCOL 3350 17 G PO PACK: 17.0000 g | PACK | Freq: Every day | ORAL | Status: DC

## 2018-04-19 MED ORDER — DIPHENHYDRAMINE HCL 25 MG PO CAPS: 25.0000 mg | ORAL_CAPSULE | Freq: Four times a day (QID) | ORAL | Status: DC | PRN

## 2018-04-19 MED ORDER — PROCHLORPERAZINE MALEATE 10 MG PO TABS: 10.0000 mg | ORAL_TABLET | Freq: Four times a day (QID) | ORAL | Status: DC | PRN

## 2018-04-19 MED ORDER — HYDROCODONE-ACETAMINOPHEN 5-325 MG PO TABS
1.0000 | ORAL_TABLET | ORAL | Status: DC | PRN
  Administered 2018-04-21 – 2018-04-22 (×2): 1 via ORAL
  Administered 2018-04-24: 19:00:00 2 via ORAL
  Filled 2018-04-19: qty 2
  Filled 2018-04-19 (×2): qty 1

## 2018-04-19 MED ORDER — FENTANYL 25 MCG/HR TD PT72
50.0000 ug | MEDICATED_PATCH | TRANSDERMAL | Status: DC
  Administered 2018-04-19 – 2018-04-22 (×2): 50 ug via TRANSDERMAL
  Filled 2018-04-19 (×3): qty 2

## 2018-04-19 MED ORDER — METHOCARBAMOL 500 MG PO TABS: 500.0000 mg | ORAL_TABLET | Freq: Two times a day (BID) | ORAL | Status: DC

## 2018-04-19 MED ORDER — FENTANYL 25 MCG/HR TD PT72
50.0000 ug | MEDICATED_PATCH | TRANSDERMAL | Status: DC
  Filled 2018-04-19: qty 2

## 2018-04-19 MED ORDER — FAMOTIDINE IN NACL 20-0.9 MG/50ML-% IV SOLN
20.0000 mg | Freq: Two times a day (BID) | INTRAVENOUS | Status: DC
  Administered 2018-04-19 – 2018-04-24 (×10): 20 mg via INTRAVENOUS
  Filled 2018-04-19 (×10): qty 50

## 2018-04-19 MED ORDER — HEPARIN SOD (PORK) LOCK FLUSH 100 UNIT/ML IV SOLN
500.0000 [IU] | Freq: Once | INTRAVENOUS | Status: AC
  Filled 2018-04-19: qty 5

## 2018-04-19 MED ORDER — SODIUM CHLORIDE 0.9 % IV SOLN
Freq: Once | INTRAVENOUS | Status: AC
  Administered 2018-04-19: 14:00:00 via INTRAVENOUS
  Filled 2018-04-19: qty 250

## 2018-04-19 MED ORDER — MORPHINE SULFATE 2 MG/ML IJ SOLN
2.0000 mg | Freq: Once | INTRAMUSCULAR | Status: AC
  Administered 2018-04-19: 1 mg via INTRAVENOUS
  Filled 2018-04-19: qty 1

## 2018-04-19 MED ORDER — METOCLOPRAMIDE HCL 5 MG PO TABS: 10.0000 mg | ORAL_TABLET | Freq: Three times a day (TID) | ORAL | Status: DC

## 2018-04-19 MED ORDER — CHOLECALCIFEROL 25 MCG (1000 UT) PO CAPS: 1000.0000 [IU] | ORAL_CAPSULE | Freq: Every day | ORAL | Status: DC

## 2018-04-19 MED ORDER — ONDANSETRON HCL 4 MG PO TABS: 8.0000 mg | ORAL_TABLET | Freq: Three times a day (TID) | ORAL | Status: DC

## 2018-04-19 MED ORDER — ENOXAPARIN SODIUM 40 MG/0.4ML ~~LOC~~ SOLN
40.0000 mg | SUBCUTANEOUS | Status: DC
  Administered 2018-04-19 – 2018-04-23 (×5): 40 mg via SUBCUTANEOUS
  Filled 2018-04-19 (×5): qty 0.4

## 2018-04-19 MED ORDER — SODIUM CHLORIDE 0.9 % IV SOLN: Freq: Once | INTRAVENOUS | Status: DC

## 2018-04-19 MED ORDER — GABAPENTIN 300 MG PO CAPS: 300.0000 mg | ORAL_CAPSULE | Freq: Every day | ORAL | Status: DC

## 2018-04-19 MED ORDER — GI COCKTAIL ~~LOC~~
30.0000 mL | Freq: Once | ORAL | Status: AC
  Administered 2018-04-19: 21:00:00 30 mL via ORAL
  Filled 2018-04-19: qty 30

## 2018-04-19 MED ORDER — ONDANSETRON HCL 4 MG/2ML IJ SOLN
8.0000 mg | Freq: Once | INTRAMUSCULAR | Status: AC
  Administered 2018-04-19: 8 mg via INTRAVENOUS
  Filled 2018-04-19: qty 4

## 2018-04-19 MED ORDER — HYDROMORPHONE HCL 1 MG/ML IJ SOLN
1.0000 mg | INTRAMUSCULAR | Status: DC | PRN
  Administered 2018-04-19 (×2): 1 mg via INTRAVENOUS
  Filled 2018-04-19 (×2): qty 1

## 2018-04-19 MED ORDER — SODIUM CHLORIDE 0.9 % IV SOLN
2.0000 g | Freq: Three times a day (TID) | INTRAVENOUS | Status: DC
  Administered 2018-04-19 – 2018-04-23 (×10): 2 g via INTRAVENOUS
  Filled 2018-04-19 (×14): qty 2

## 2018-04-19 MED ORDER — STERILE WATER FOR INJECTION IJ SOLN: 30.0000 mg/kg | Freq: Two times a day (BID) | INTRAMUSCULAR | Status: DC

## 2018-04-19 MED ORDER — PROCHLORPERAZINE EDISYLATE 10 MG/2ML IJ SOLN
10.0000 mg | INTRAMUSCULAR | Status: DC | PRN
  Administered 2018-04-20: 10 mg via INTRAVENOUS
  Filled 2018-04-19 (×2): qty 2

## 2018-04-19 MED ORDER — ACYCLOVIR 200 MG PO CAPS
800.0000 mg | ORAL_CAPSULE | Freq: Every evening | ORAL | Status: DC
  Administered 2018-04-19: 21:00:00 800 mg via ORAL
  Filled 2018-04-19 (×7): qty 4

## 2018-04-19 MED ORDER — ESCITALOPRAM OXALATE 10 MG PO TABS
10.0000 mg | ORAL_TABLET | Freq: Every evening | ORAL | Status: DC
  Administered 2018-04-19 – 2018-04-24 (×6): 10 mg via ORAL
  Filled 2018-04-19 (×7): qty 1

## 2018-04-19 NOTE — Consult Note (Signed)
[facility]  [phone] [phone]   Name: Valerie Horton Date: 04/19/2018 MRN: [id number]  DOB: 1961-01-25  Patient Care Team: Valerie Roys, DO as PCP - General (Family Medicine) Clent Jacks, RN as Registered Nurse Garnetta Buddy, MD as Consulting Physician (Internal Medicine)    [place]: Palliative Care consult requested for this 57 y.o. female for goals of medical treatment in patient with multiple medical problems including stage IV peritoneal carcinomatosis initially diagnosed in July 2017. She is status post 12 cycles of FOLFOX (completed January 2018) with subsequent disease progression.  She was treated with 18 cycles of FOLFIRI beginning January 2018 and ending October 2019.  She restarted FOLFIRI July 2019 but had poor tolerance.  She is now being treated with irinotecan and Panitumumab.  Unfortunately patient has had evidence of progressive disease with malignant ascites requiring recurrent therapeutic paracentesis, poor oral intake, and subsequent weight loss.  Patient was admitted to the hospital on 04/19/18 due to intractable nausea and vomiting and neutropenic fever.  Palliative care has been asked to help address symptom management and clarify goals of care.   SOCIAL HISTORY:    Patient lives at home with her husband and adult daughter.  She has two other adult children who do not live at home.  Patient formally worked as a Copywriter, advertising.  ADVANCE DIRECTIVES:  Patient's husband is her healthcare power of attorney.  Patient has completed a living will.  CODE STATUS: DNR  PAST MEDICAL HISTORY: Past Medical History:  Diagnosis Date  . Acid reflux   . Allergic rhinitis   . Anxiety   . Arthritis    left knee  . Cervical spondylosis   . CTS (carpal tunnel syndrome)    Right  . Diverticulosis   . HSV-2 (herpes simplex virus 2) infection   . Hyperlipidemia   . Ovarian  failure   . Peritoneal carcinomatosis ([place]) 01/2016   chemo tx's.   . Rosacea   . Wears contact lenses     PAST SURGICAL HISTORY:  Past Surgical History:  Procedure Laterality Date  . ANTERIOR CRUCIATE LIGAMENT REPAIR Left   . CESAREAN SECTION    . COLONOSCOPY WITH PROPOFOL N/A 01/29/2016   Procedure: COLONOSCOPY WITH PROPOFOL;  Surgeon: Lucilla Lame, MD;  Location: [place];  Service: Endoscopy;  Laterality: N/A;  . COLONOSCOPY WITH PROPOFOL N/A 01/01/2018   Procedure: COLONOSCOPY WITH PROPOFOL;  Surgeon: Lucilla Lame, MD;  Location: [place];  Service: Endoscopy;  Laterality: N/A;  . ESOPHAGOGASTRODUODENOSCOPY (EGD) WITH PROPOFOL N/A 01/29/2016   Procedure: ESOPHAGOGASTRODUODENOSCOPY (EGD) WITH PROPOFOL;  Surgeon: Lucilla Lame, MD;  Location: [place];  Service: Endoscopy;  Laterality: N/A;  . ESOPHAGOGASTRODUODENOSCOPY (EGD) WITH PROPOFOL N/A 01/01/2018   Procedure: ESOPHAGOGASTRODUODENOSCOPY (EGD) WITH PROPOFOL;  Surgeon: Lucilla Lame, MD;  Location: [place];  Service: Endoscopy;  Laterality: N/A;  . PERIPHERAL VASCULAR CATHETERIZATION N/A 02/10/2016   Procedure: Glori Luis Cath Insertion;  Surgeon: Algernon Huxley, MD;  Location: [place] [facility];  Service: Cardiovascular;  Laterality: N/A;    HEMATOLOGY/ONCOLOGY HISTORY:  Oncology History   Patient with initial diagnosis in July 2017 where she presented to the emergency room for abdominal pain, bloating and fullness.  She was found to have ascites and a paracentesis was performed.  Ca 125 was elevated.  Omental biopsy was performed along with testing of the fluid from recent paracentesis.  Pathology revealed lower GI primary.  She was started on FOLFOX for 12 cycles.  Compleated 11 cycles on 07/21/2016.  Progression of disease was noted and therapy was switched. Began FOLFIRI in January 2018.  Received 18 cycles completing in October 2018.  She required ultrasound-guided paracentesis more frequient.  She  was not a surgical candidate. Foundation one testing did not reveal any actionable mutations.  Restarted FOLFIRI chemotherapy in July 2019 with poor tolerance.      Peritoneal carcinomatosis ([place])   01/20/2016 Initial Diagnosis    Peritoneal carcinomatosis ([place])    03/30/2018 -  Chemotherapy    The patient had palonosetron (ALOXI) injection 0.25 mg, 0.25 mg, Intravenous,  Once, 2 of 6 cycles Administration: 0.25 mg (04/02/2018), 0.25 mg (04/16/2018) irinotecan (CAMPTOSAR) 300 mg in dextrose 5 % 500 mL chemo infusion, 180 mg/m2 = 300 mg, Intravenous,  Once, 2 of 6 cycles Administration: 300 mg (04/02/2018), 300 mg (04/16/2018) panitumumab (VECTIBIX) 400 mg in sodium chloride 0.9 % 100 mL chemo infusion, 6 mg/kg = 400 mg, Intravenous,  Once, 2 of 6 cycles Administration: 400 mg (04/02/2018), 400 mg (04/16/2018)  for chemotherapy treatment.      ALLERGIES:  has No Known Allergies.  MEDICATIONS:  Current Facility-Administered Medications  Medication Dose Route Frequency Provider Last Rate Last Dose  . acyclovir (ZOVIRAX) 200 MG capsule 800 mg  800 mg Oral QPM Salary, Montell D, MD      . ceFEPIme (MAXIPIME) 2 g in sodium chloride 0.9 % 100 mL IVPB  2 g Intravenous Q8H Maccia, Melissa D, RPH      . dextrose 5 %-0.45 % sodium chloride infusion   Intravenous Continuous Salary, Montell D, MD      . diphenhydrAMINE (BENADRYL) capsule 25 mg  25 mg Oral Q6H PRN Salary, Montell D, MD      . enoxaparin (LOVENOX) injection 40 mg  40 mg Subcutaneous Q24H Salary, Montell D, MD      . escitalopram (LEXAPRO) tablet 10 mg  10 mg Oral QPM Salary, Montell D, MD      . famotidine (PEPCID) IVPB 20 mg premix  20 mg Intravenous Q12H Salary, Montell D, MD      . fentaNYL ([place] - dosed mcg/hr) patch 50 mcg  50 mcg Transdermal Q72H Salary, Montell D, MD      . gi cocktail (Maalox,Lidocaine,Donnatal)  30 mL Oral Once Salary, Montell D, MD      . HYDROcodone-acetaminophen (NORCO/VICODIN) 5-325 MG per tablet 1-2  tablet  1-2 tablet Oral Q4H PRN Salary, Montell D, MD      . HYDROmorphone (DILAUDID) injection 1 mg  1 mg Intravenous Q2H PRN Salary, Montell D, MD      . loperamide (IMODIUM) capsule 4 mg  4 mg Oral PRN Salary, Avel Peace, MD      . Derrill Memo ON 04/20/2018] meloxicam (MOBIC) tablet 15 mg  15 mg Oral Daily Salary, Montell D, MD      . ondansetron (ZOFRAN) tablet 8 mg  8 mg Oral Q8H Salary, Montell D, MD      . oxyCODONE (Oxy IR/ROXICODONE) immediate release tablet 10 mg  10 mg Oral Q4H PRN Salary, Montell D, MD      . prochlorperazine (COMPAZINE) injection 10 mg  10 mg Intravenous Q4H PRN Rakhi Romagnoli, Kirt Boys, NP      . promethazine (PHENERGAN) tablet 12.5 mg  12.5 mg Oral Q6H PRN Salary, Avel Peace, MD       Facility-Administered Medications Ordered in Other Encounters  Medication Dose Route Frequency Provider  Last Rate Last Dose  . 0.9 %  sodium chloride infusion   Intravenous Once Lloyd Huger, MD      . heparin lock flush 100 unit/mL  500 Units Intravenous Once Lloyd Huger, MD      . heparin lock flush 100 unit/mL  500 Units Intravenous Once Lloyd Huger, MD      . sodium chloride flush (NS) 0.9 % injection 10 mL  10 mL Intravenous PRN Lloyd Huger, MD   10 mL at 08/16/16 0925    VITAL SIGNS: BP 126/61 (BP Location: Right Arm)   Pulse 98   Temp 97.8 F (36.6 C) (Oral)   SpO2 100%  There were no vitals filed for this visit.  Estimated body mass index is 24.8 kg/m as calculated from the following:   Height as of 04/09/18: 5\' 3"  (1.6 m).   Weight as of 04/16/18: 140 lb (63.5 kg).  LABS: CBC:    Component Value Date/Time   WBC 1.1 (LL) 04/19/2018 1340   HGB 12.2 04/19/2018 1340   HGB 12.0 09/28/2017 1417   HCT 38.8 04/19/2018 1340   HCT 35.3 09/28/2017 1417   PLT 126 (L) 04/19/2018 1340   PLT 309 09/28/2017 1417   MCV 81.7 04/19/2018 1340   MCV 84 09/28/2017 1417   NEUTROABS 0.7 (L) 04/19/2018 1340   NEUTROABS 3.9 09/28/2017 1417   LYMPHSABS 0.3 (L)  04/19/2018 1340   LYMPHSABS 1.7 09/28/2017 1417   MONOABS 0.1 04/19/2018 1340   EOSABS 0.0 04/19/2018 1340   EOSABS 0.0 09/28/2017 1417   BASOSABS 0.0 04/19/2018 1340   BASOSABS 0.0 09/28/2017 1417   Comprehensive Metabolic Panel:    Component Value Date/Time   NA 135 04/19/2018 1340   NA 139 09/28/2017 1417   K 3.7 04/19/2018 1340   CL 98 04/19/2018 1340   CO2 25 04/19/2018 1340   BUN 14 04/19/2018 1340   BUN 17 09/28/2017 1417   CREATININE 0.67 04/19/2018 1340   GLUCOSE 166 (H) 04/19/2018 1340   CALCIUM 8.8 (L) 04/19/2018 1340   AST 19 04/19/2018 1340   ALT 18 04/19/2018 1340   ALKPHOS 93 04/19/2018 1340   BILITOT 1.3 (H) 04/19/2018 1340   BILITOT <0.2 09/28/2017 1417   PROT 6.6 04/19/2018 1340   PROT 6.7 09/28/2017 1417   ALBUMIN 3.4 (L) 04/19/2018 1340   ALBUMIN 3.8 09/28/2017 1417    RADIOGRAPHIC STUDIES: US Paracentesis  Result Date: 03/27/2018 INDICATION: Patient with history of peritoneal carcinomatosis, abdominal distention, recurrent ascites. Request is made for therapeutic paracentesis. EXAM: ULTRASOUND GUIDED tHERAPEUTIC PARACENTESIS MEDICATIONS: 10 mL 1% lidocaine COMPLICATIONS: None immediate. PROCEDURE: Informed written consent was obtained from the patient after a discussion of the risks, benefits and alternatives to treatment. A timeout was performed prior to the initiation of the procedure. Initial ultrasound scanning demonstrates a moderate amount of ascites within the left lateral abdomen. The left lateral abdomen was prepped and draped in the usual sterile fashion. 1% lidocaine was used for local anesthesia. Following this, a 19 gauge, 7-cm, Yueh catheter was introduced. An ultrasound image was saved for documentation purposes. The paracentesis was performed. The catheter was removed and a dressing was applied. The patient tolerated the procedure well without immediate post procedural complication. FINDINGS: A total of approximately 2.9 liters of dark, amber  fluid was removed. IMPRESSION: Successful ultrasound-guided therapeutic paracentesis yielding 2.9 liters of peritoneal fluid. Read by: Brynda Greathouse PA-C Electronically Signed   By: Rodena Goldmann.D.  On: 03/27/2018 09:19   Dg Abd 2 Views  Result Date: 04/12/2018 CLINICAL DATA:  Abdominal pain with vomiting and diarrhea. History of peritoneal carcinomatosis. EXAM: ABDOMEN - 2 VIEW COMPARISON:  CT abdomen pelvis dated January 27, 2018. FINDINGS: Focal loop of dilated, air-filled small bowel in the left abdomen. No air-fluid levels. Air and stool are seen within the colon. There is no evidence of free air. No radio-opaque calculi or other significant radiographic abnormality is seen. IMPRESSION: 1. Mildly dilated loop of small bowel in the left abdomen, nonspecific. This could reflect enteritis or partial obstruction. Electronically Signed   By: Titus Dubin M.D.   On: 04/12/2018 16:51    PERFORMANCE STATUS (ECOG) : 2 - Symptomatic, <50% confined to bed  Review of Systems As noted above. Otherwise, a complete review of systems is negative.  Physical Exam General: NAD, frail appearing, thin Cardiovascular: regular rate and rhythm Pulmonary: unlabored Neurological: Weakness but otherwise nonfocal  IMPRESSION: Follow-up visit made post hospital admission.  Spoke with patient and husband.  Both would be interested in imaging and work-up of acute symptoms.  Patient says that she had a goals of care conversation with the hospitalist.  She is unsure of future treatment decisions but says her primary goal right now is to feel better.  I would recommend KUB to rule out obstruction.  In the event of the malignant obstruction, consideration could be given for venting PEG for CT-guided drain to palliate intractable nausea and vomiting.  In the interim, will aggressively manage her symptoms with IV antiemetics and opioids.  We will follow-up with patient and family to readdress goals.  PLAN: Recommend  abdominal imaging Recommend aggressive symptomatic care with IV antiemetics and analgesics Will follow and readdress goals   Time Total: 40 minutes  Visit consisted of counseling and education dealing with the complex and emotionally intense issues of symptom management and palliative care in the setting of serious and potentially life-threatening illness.Greater than 50%  of this time was spent counseling and coordinating care related to the above assessment and plan.  Signed by: Altha Harm, [place], NP-C, [place] (Work Cell)

## 2018-04-19 NOTE — H&P (Signed)
[facility] [place] NAME: Valerie Horton    MR#:  [id number]  DATE OF BIRTH:  08-19-1960  DATE OF ADMISSION:  04/19/2018  PRIMARY CARE PHYSICIAN: Valerie Roys, DO   REQUESTING/REFERRING PHYSICIAN:   CHIEF COMPLAINT:  No chief complaint on file.   HISTORY OF PRESENT ILLNESS: Valerie Horton  is a 57 y.o. female with a known history per below which includes incurable peritoneal carcinomatosis, accepted as a direct admission from previous hospitalist to the hospital for pain control as well as consideration for comfort care/hospice going forward given her terminal condition-see oncology/Dr. Gary Fleet note, patient evaluated at the bedside with husband, patient wishes to be DNR at this time, currently would like to think about their options of continued chemotherapy versus doing hospice with comfort care, the patient and the patient's husband wish to think about it a little more, not interested in x-ray at this time given that any surgical procedure they would not be interested in, patient is now been admitted for acute on chronic worsening peritoneal carcinomatosis with associated cancer pain and neutropenia.  PAST MEDICAL HISTORY:   Past Medical History:  Diagnosis Date  . Acid reflux   . Allergic rhinitis   . Anxiety   . Arthritis    left knee  . Cervical spondylosis   . CTS (carpal tunnel syndrome)    Right  . Diverticulosis   . HSV-2 (herpes simplex virus 2) infection   . Hyperlipidemia   . Ovarian failure   . Peritoneal carcinomatosis ([place]) 01/2016   chemo tx's.   . Rosacea   . Wears contact lenses     PAST SURGICAL HISTORY:  Past Surgical History:  Procedure Laterality Date  . ANTERIOR CRUCIATE LIGAMENT REPAIR Left   . CESAREAN SECTION    . COLONOSCOPY WITH PROPOFOL N/A 01/29/2016   Procedure: COLONOSCOPY WITH PROPOFOL;  Surgeon: Lucilla Lame, MD;  Location: [place];  Service: Endoscopy;  Laterality: N/A;  .  COLONOSCOPY WITH PROPOFOL N/A 01/01/2018   Procedure: COLONOSCOPY WITH PROPOFOL;  Surgeon: Lucilla Lame, MD;  Location: [place];  Service: Endoscopy;  Laterality: N/A;  . ESOPHAGOGASTRODUODENOSCOPY (EGD) WITH PROPOFOL N/A 01/29/2016   Procedure: ESOPHAGOGASTRODUODENOSCOPY (EGD) WITH PROPOFOL;  Surgeon: Lucilla Lame, MD;  Location: [place];  Service: Endoscopy;  Laterality: N/A;  . ESOPHAGOGASTRODUODENOSCOPY (EGD) WITH PROPOFOL N/A 01/01/2018   Procedure: ESOPHAGOGASTRODUODENOSCOPY (EGD) WITH PROPOFOL;  Surgeon: Lucilla Lame, MD;  Location: [place];  Service: Endoscopy;  Laterality: N/A;  . PERIPHERAL VASCULAR CATHETERIZATION N/A 02/10/2016   Procedure: Glori Luis Cath Insertion;  Surgeon: Algernon Huxley, MD;  Location: [facility];  Service: Cardiovascular;  Laterality: N/A;    SOCIAL HISTORY:  Social History   Tobacco Use  . Smoking status: Former Research scientist (life sciences)  . Smokeless tobacco: Never Used  . Tobacco comment: Quit in her 36's  Substance Use Topics  . Alcohol use: Yes    Alcohol/week: 0.0 standard drinks    Comment: 1 drink/mo    FAMILY HISTORY:  Family History  Adopted: Yes    DRUG ALLERGIES: No Known Allergies  REVIEW OF SYSTEMS:   CONSTITUTIONAL: No fever,+ fatigue, weakness.  EYES: No blurred or double vision.  EARS, NOSE, AND THROAT: No tinnitus or ear pain.  RESPIRATORY: No cough, shortness of breath, wheezing or hemoptysis.  CARDIOVASCULAR: No chest pain, orthopnea, edema.  GASTROINTESTINAL: + nausea, pain with eating/swallowing, abdominal pain.  GENITOURINARY: No dysuria, hematuria.  ENDOCRINE: No polyuria, nocturia,  HEMATOLOGY: No anemia, easy bruising or bleeding SKIN: No rash or lesion. MUSCULOSKELETAL: No joint pain or arthritis.   NEUROLOGIC: No tingling, numbness, weakness.  PSYCHIATRY: No anxiety or depression.   MEDICATIONS AT HOME:  Prior to Admission medications   Medication Sig Start Date End Date Taking? Authorizing  Provider  acyclovir (ZOVIRAX) 800 MG tablet TAKE 1 TABLET BY MOUTH EVERY DAY 03/22/17   Park Liter P, DO  Cholecalciferol 1000 units capsule Take 1,000 Units by mouth daily.     [provider]  clindamycin (CLINDAGEL) 1 % gel Apply topically 2 (two) times daily. 04/16/18   Lloyd Huger, MD  diphenhydrAMINE (BENADRYL) 25 MG tablet Take 25 mg by mouth every 6 (six) hours as needed.    [provider]  escitalopram (LEXAPRO) 10 MG tablet TAKE 1 TABLET BY MOUTH EVERY DAY 02/26/18   Johnson, Megan P, DO  fentaNYL (DURAGESIC - DOSED MCG/HR) 25 MCG/HR patch Place 1 patch (25 mcg total) onto the skin every 3 (three) days. 04/16/18   Lloyd Huger, MD  fluticasone (FLONASE) 50 MCG/ACT nasal spray Place into both nostrils daily.    [provider]  gabapentin (NEURONTIN) 300 MG capsule Take 300 mg by mouth at bedtime.    [provider]  HYDROcodone-acetaminophen (NORCO/VICODIN) 5-325 MG tablet TAKE 1 TABLET BY MOUTH EVERY 4 TO 6 HOURS AS NEEDED FOR PAIN WITH FOOD 09/14/17   [provider]  lidocaine-prilocaine (EMLA) cream Apply 1 application topically as needed. 09/13/16   Lloyd Huger, MD  loperamide (IMODIUM) 2 MG capsule Take 2 capsules (4 mg total) by mouth as needed for diarrhea or loose stools. 04/16/18   Lloyd Huger, MD  meloxicam (MOBIC) 15 MG tablet Mobic 15 mg tablet  Take 1 tablet every day by oral route with meals.    [provider]  methocarbamol (ROBAXIN) 500 MG tablet Take 500 mg by mouth 2 (two) times daily. 11/20/17   [provider]  metoCLOPramide (REGLAN) 10 MG tablet Take 1 tablet (10 mg total) by mouth 3 (three) times daily before meals. 04/11/18   Lloyd Huger, MD  naproxen (NAPROSYN) 500 MG tablet Take 1 tablet (500 mg total) by mouth 2 (two) times daily with a meal. 06/29/17   Johnson, Megan P, DO  ondansetron (ZOFRAN) 8 MG tablet Take 1 tablet (8 mg total) by mouth every 8 (eight)  hours as needed for nausea or vomiting. 01/24/18   Lloyd Huger, MD  ondansetron (ZOFRAN-ODT) 8 MG disintegrating tablet DISSOLVE 1 TABLET ON TONGUE EVERY 6 TO 8 HOURS AS NEEDED FOR NAUSEA OR VOMITING 09/14/17   [provider]  Oxycodone HCl 10 MG TABS Take 1 tablet (10 mg total) by mouth every 4 (four) hours as needed. 04/11/18   Lloyd Huger, MD  oxyCODONE-acetaminophen (PERCOCET/ROXICET) 5-325 MG tablet Take 1-2 tablets by mouth every 4 (four) hours as needed for severe pain. 01/28/18   Loney Hering, MD  pantoprazole (PROTONIX) 40 MG tablet TAKE 1 TABLET BY MOUTH EVERY DAY 01/02/18   Lloyd Huger, MD  polyethylene glycol [facility]) packet Take 17 g by mouth daily. 01/28/18   Loney Hering, MD  prochlorperazine (COMPAZINE) 10 MG tablet Take 1 tablet (10 mg total) by mouth every 6 (six) hours as needed for nausea or vomiting. 01/24/18   Lloyd Huger, MD      PHYSICAL EXAMINATION:   VITAL SIGNS: Blood pressure 126/61, pulse 98, temperature 97.8 F (36.6 C),  temperature source Oral, SpO2 100 %.  GENERAL:  57 y.o.-year-old patient lying in the bed with no acute distress.  EYES: Pupils equal, round, reactive to light and accommodation. No scleral icterus. Extraocular muscles intact.  HEENT: Head atraumatic, normocephalic. Oropharynx and nasopharynx clear.  NECK:  Supple, no jugular venous distention. No thyroid enlargement, no tenderness.  LUNGS: Normal breath sounds bilaterally, no wheezing, rales,rhonchi or crepitation. No use of accessory muscles of respiration.  CARDIOVASCULAR: S1, S2 normal. No murmurs, rubs, or gallops.  ABDOMEN: Diffuse abdominal tenderness without rebound/guarding  EXTREMITIES: No pedal edema, cyanosis, or clubbing.  NEUROLOGIC: Cranial nerves II through XII are intact. MAES. Gait not checked.  PSYCHIATRIC: The patient is alert and oriented x 3.  SKIN: No obvious rash, lesion, or ulcer.   LABORATORY PANEL:   CBC Recent Labs   Lab 04/16/18 0838 04/19/18 1340  WBC 5.7 1.1*  HGB 12.8 12.2  HCT 40.5 38.8  PLT 193 126*  MCV 81.5 81.7  MCH 25.8* 25.7*  MCHC 31.6 31.4  RDW 16.0* 16.6*  LYMPHSABS 1.4 0.3*  MONOABS 0.8 0.1  EOSABS 0.1 0.0  BASOSABS 0.0 0.0   ------------------------------------------------------------------------------------------------------------------  Chemistries  Recent Labs  Lab 04/16/18 0838 04/19/18 1340  NA 134* 135  K 3.0* 3.7  CL 95* 98  CO2 27 25  GLUCOSE 113* 166*  BUN 7 14  CREATININE 0.72 0.67  CALCIUM 9.1 8.8*  MG 1.7 2.0  AST 19 19  ALT 13 18  ALKPHOS 103 93  BILITOT 0.8 1.3*   ------------------------------------------------------------------------------------------------------------------ estimated creatinine clearance is 67.9 mL/min (by C-G formula based on SCr of 0.67 mg/dL). ------------------------------------------------------------------------------------------------------------------ No results for input(s): TSH, T4TOTAL, T3FREE, THYROIDAB in the last 72 hours.  Invalid input(s): FREET3   Coagulation profile No results for input(s): INR, PROTIME in the last 168 hours. ------------------------------------------------------------------------------------------------------------------- No results for input(s): DDIMER in the last 72 hours. -------------------------------------------------------------------------------------------------------------------  Cardiac Enzymes No results for input(s): CKMB, TROPONINI, MYOGLOBIN in the last 168 hours.  Invalid input(s): CK ------------------------------------------------------------------------------------------------------------------ Invalid input(s): POCBNP  ---------------------------------------------------------------------------------------------------------------  Urinalysis    Component Value Date/Time   COLORURINE YELLOW (A) 01/11/2016 1525   APPEARANCEUR Cloudy (A) 09/28/2017 1415   LABSPEC  1.028 01/11/2016 1525   PHURINE 5.0 01/11/2016 1525   GLUCOSEU Negative 09/28/2017 1415   HGBUR NEGATIVE 01/11/2016 1525   BILIRUBINUR Negative 09/28/2017 1415   KETONESUR 1+ (A) 01/11/2016 1525   PROTEINUR Negative 09/28/2017 1415   PROTEINUR 30 (A) 01/11/2016 1525   NITRITE Negative 09/28/2017 1415   NITRITE NEGATIVE 01/11/2016 1525   LEUKOCYTESUR 1+ (A) 09/28/2017 1415     RADIOLOGY: No results found.  EKG: Orders placed or performed during the hospital encounter of 01/11/16  . ED EKG  . ED EKG  . EKG 12-Lead  . EKG 12-Lead  . EKG    IMPRESSION AND PLAN: *Acute on chronic worsening peritoneal carcinomatosis Terminal condition noted, currently on palliative chemotherapy, accepted as a direct admission from Dr. Finnegan/oncology-see outpatient note, patient and the patient's husband considering continuing palliative chemo versus hospice with comfort care going forward Admit to regular nursing for bed, adult pain protocol, increase fentanyl patch to 50 mcg every 72 hours, IV Dilaudid PRN breakthrough pain, IV fluids for rehydration, palliative care consulted  *Acute neutropenic fever empiric IV cefepime, check blood cultures, check urinalysis, blood work from earlier today unimpressive with exception of white count of 1.1  *Acute on chronic cancer pain Adult pain protocol   All the records are reviewed and case discussed  with ED provider. Management plans discussed with the patient, family and they are in agreement.  CODE STATUS:dnr Advance Directive Documentation     Most Recent Value  Type of Advance Directive  Healthcare Power of Attorney, Living will  Pre-existing out of facility DNR order (yellow form or pink MOST form)  -  "MOST" Form in Place?  -       TOTAL TIME TAKING CARE OF THIS PATIENT: 45 minutes.    Avel Peace Salary M.D on 04/19/2018   Between 7am to 6pm - Pager - [phone]  After 6pm go to [email or web address] - password [email or web address]  Office  [phone]  CC: Primary care physician; Valerie Roys, DO   Note: This dictation was prepared with Dragon dictation along with smaller phrase technology. Any transcriptional errors that result from this process are unintentional.

## 2018-04-19 NOTE — Progress Notes (Signed)
Family Meeting Note  Advance Directive:yes  Today a meeting took place with the Patient.  Patient is able to participate   The following clinical team members were present during this meeting:MD  The following were discussed:Patient's diagnosis: Terminal condition from cancer, Patient's progosis: Unable to determine and Goals for treatment: DNR  Additional follow-up to be provided: prn  Time spent during discussion:20 minutes  Gorden Harms, MD

## 2018-04-19 NOTE — Progress Notes (Signed)
[facility]  [phone] [phone]) [phone]  ID: [id number] LEISNER OB: 1961-04-28  MR#: [id number]  LNL#:[id number]  Patient Care Team: Valerie Roys, DO as PCP - General (Family Medicine) Clent Jacks, RN as Registered Nurse Garnetta Buddy, MD as Consulting Physician (Internal Medicine)  CHIEF COMPLAINT: Peritoneal carcinomatosis, metastatic adenocarcinoma of likely lower GI primary.  INTERVAL HISTORY: Patient returns to clinic today as an add-on 3 days after receiving her most recent chemotherapy with irinotecan and Panitumumab.  She is having intractable nausea and vomiting and cannot tolerate any PO intake including water.  She also has intractable abdominal pain.  She feels constipated and does not remember her last bowel movement.  She has a low-grade fever.  Her performance status has significantly declined. She has no neurologic complaints.  She denies any chest pain or shortness of breath.  She has no urinary complaints.  Patient feels generally terrible, but offers no further specific complaints.  REVIEW OF SYSTEMS:   Review of Systems  Constitutional: Positive for malaise/fatigue and weight loss. Negative for fever.  Respiratory: Negative.  Negative for cough and shortness of breath.   Cardiovascular: Negative.  Negative for chest pain and leg swelling.  Gastrointestinal: Positive for abdominal pain, constipation, nausea and vomiting. Negative for blood in stool, diarrhea and melena.  Genitourinary: Negative.  Negative for dysuria.  Musculoskeletal: Negative.  Negative for back pain.  Skin: Negative for rash.  Neurological: Positive for weakness. Negative for sensory change, focal weakness and headaches.  Psychiatric/Behavioral: The patient is nervous/anxious.     As per HPI. Otherwise, a complete review of systems is negative.  PAST MEDICAL HISTORY: Past Medical History:  Diagnosis Date  . Acid reflux   . Allergic rhinitis   .  Anxiety   . Arthritis    left knee  . Cervical spondylosis   . CTS (carpal tunnel syndrome)    Right  . Diverticulosis   . HSV-2 (herpes simplex virus 2) infection   . Hyperlipidemia   . Ovarian failure   . Peritoneal carcinomatosis ([place]) 01/2016   chemo tx's.   . Rosacea   . Wears contact lenses     PAST SURGICAL HISTORY: Past Surgical History:  Procedure Laterality Date  . ANTERIOR CRUCIATE LIGAMENT REPAIR Left   . CESAREAN SECTION    . COLONOSCOPY WITH PROPOFOL N/A 01/29/2016   Procedure: COLONOSCOPY WITH PROPOFOL;  Surgeon: Lucilla Lame, MD;  Location: [facility];  Service: Endoscopy;  Laterality: N/A;  . COLONOSCOPY WITH PROPOFOL N/A 01/01/2018   Procedure: COLONOSCOPY WITH PROPOFOL;  Surgeon: Lucilla Lame, MD;  Location: [facility];  Service: Endoscopy;  Laterality: N/A;  . ESOPHAGOGASTRODUODENOSCOPY (EGD) WITH PROPOFOL N/A 01/29/2016   Procedure: ESOPHAGOGASTRODUODENOSCOPY (EGD) WITH PROPOFOL;  Surgeon: Lucilla Lame, MD;  Location: [facility];  Service: Endoscopy;  Laterality: N/A;  . ESOPHAGOGASTRODUODENOSCOPY (EGD) WITH PROPOFOL N/A 01/01/2018   Procedure: ESOPHAGOGASTRODUODENOSCOPY (EGD) WITH PROPOFOL;  Surgeon: Lucilla Lame, MD;  Location: [facility];  Service: Endoscopy;  Laterality: N/A;  . PERIPHERAL VASCULAR CATHETERIZATION N/A 02/10/2016   Procedure: Glori Luis Cath Insertion;  Surgeon: Algernon Huxley, MD;  Location: [facility];  Service: Cardiovascular;  Laterality: N/A;    FAMILY HISTORY: Unknown.  Patient is adopted.     ADVANCED DIRECTIVES:    HEALTH MAINTENANCE: Social History   Tobacco Use  . Smoking status: Former Research scientist (life sciences)  . Smokeless tobacco: Never Used  . Tobacco comment: Quit in her 36's  Substance Use  Topics  . Alcohol use: Yes    Alcohol/week: 0.0 standard drinks    Comment: 1 drink/mo  . Drug use: No     Colonoscopy:  PAP:  Bone density:  Lipid panel:  No Known Allergies  Current Outpatient Medications   Medication Sig Dispense Refill  . acyclovir (ZOVIRAX) 800 MG tablet TAKE 1 TABLET BY MOUTH EVERY DAY 90 tablet 1  . Cholecalciferol 1000 units capsule Take 1,000 Units by mouth daily.     . clindamycin (CLINDAGEL) 1 % gel Apply topically 2 (two) times daily. 30 g 1  . diphenhydrAMINE (BENADRYL) 25 MG tablet Take 25 mg by mouth every 6 (six) hours as needed.    Marland Kitchen escitalopram (LEXAPRO) 10 MG tablet TAKE 1 TABLET BY MOUTH EVERY DAY 90 tablet 1  . fentaNYL (DURAGESIC - DOSED MCG/HR) 25 MCG/HR patch Place 1 patch (25 mcg total) onto the skin every 3 (three) days. 5 patch 0  . fluticasone (FLONASE) 50 MCG/ACT nasal spray Place into both nostrils daily.    Marland Kitchen gabapentin (NEURONTIN) 300 MG capsule Take 300 mg by mouth at bedtime.    Marland Kitchen HYDROcodone-acetaminophen (NORCO/VICODIN) 5-325 MG tablet TAKE 1 TABLET BY MOUTH EVERY 4 TO 6 HOURS AS NEEDED FOR PAIN WITH FOOD  0  . lidocaine-prilocaine (EMLA) cream Apply 1 application topically as needed. 30 g 3  . loperamide (IMODIUM) 2 MG capsule Take 2 capsules (4 mg total) by mouth as needed for diarrhea or loose stools. 60 capsule 2  . meloxicam (MOBIC) 15 MG tablet Mobic 15 mg tablet  Take 1 tablet every day by oral route with meals.    . methocarbamol (ROBAXIN) 500 MG tablet Take 500 mg by mouth 2 (two) times daily.  0  . metoCLOPramide (REGLAN) 10 MG tablet Take 1 tablet (10 mg total) by mouth 3 (three) times daily before meals. 30 tablet 1  . naproxen (NAPROSYN) 500 MG tablet Take 1 tablet (500 mg total) by mouth 2 (two) times daily with a meal. 90 tablet 1  . ondansetron (ZOFRAN) 8 MG tablet Take 1 tablet (8 mg total) by mouth every 8 (eight) hours as needed for nausea or vomiting. 30 tablet 2  . ondansetron (ZOFRAN-ODT) 8 MG disintegrating tablet DISSOLVE 1 TABLET ON TONGUE EVERY 6 TO 8 HOURS AS NEEDED FOR NAUSEA OR VOMITING  0  . Oxycodone HCl 10 MG TABS Take 1 tablet (10 mg total) by mouth every 4 (four) hours as needed. 60 tablet 0  .  oxyCODONE-acetaminophen (PERCOCET/ROXICET) 5-325 MG tablet Take 1-2 tablets by mouth every 4 (four) hours as needed for severe pain. 12 tablet 0  . pantoprazole (PROTONIX) 40 MG tablet TAKE 1 TABLET BY MOUTH EVERY DAY 30 tablet 5  . polyethylene glycol (MIRALAX) packet Take 17 g by mouth daily. 14 each 0  . prochlorperazine (COMPAZINE) 10 MG tablet Take 1 tablet (10 mg total) by mouth every 6 (six) hours as needed for nausea or vomiting. 30 tablet 2   No current facility-administered medications for this visit.    Facility-Administered Medications Ordered in Other Visits  Medication Dose Route Frequency Provider Last Rate Last Dose  . 0.9 %  sodium chloride infusion   Intravenous Once Lloyd Huger, MD      . heparin lock flush 100 unit/mL  500 Units Intravenous Once Lloyd Huger, MD      . heparin lock flush 100 unit/mL  500 Units Intravenous Once Lloyd Huger, MD      .  morphine 2 MG/ML injection 1 mg  1 mg Intravenous Once Lloyd Huger, MD      . sodium chloride flush (NS) 0.9 % injection 10 mL  10 mL Intravenous PRN Lloyd Huger, MD   10 mL at 08/16/16 0925    OBJECTIVE: Vitals:   04/19/18 1356  BP: 126/79  Pulse: (!) 105  Resp: 20  Temp: 100.1 F (37.8 C)     There is no height or weight on file to calculate BMI.    ECOG FS:4 - Bedbound  General: Ill-appearing, moderate distress secondary to pain.   Eyes: Pink conjunctiva, anicteric sclera. HEENT: Normocephalic, moist mucous membranes, clear oropharnyx. Lungs: Clear to auscultation bilaterally. Heart: Regular rate and rhythm. No rubs, murmurs, or gallops. Abdomen: Diffuse tenderness. Musculoskeletal: No edema, cyanosis, or clubbing. Neuro: Alert, answering all questions appropriately. Cranial nerves grossly intact. Skin: No rashes or petechiae noted. Psych: Normal affect.  LAB RESULTS:  Lab Results  Component Value Date   NA 135 04/19/2018   K 3.7 04/19/2018   CL 98 04/19/2018   CO2  25 04/19/2018   GLUCOSE 166 (H) 04/19/2018   BUN 14 04/19/2018   CREATININE 0.67 04/19/2018   CALCIUM 8.8 (L) 04/19/2018   PROT 6.6 04/19/2018   ALBUMIN 3.4 (L) 04/19/2018   AST 19 04/19/2018   ALT 18 04/19/2018   ALKPHOS 93 04/19/2018   BILITOT 1.3 (H) 04/19/2018   GFRNONAA >60 04/19/2018   GFRAA >60 04/19/2018    Lab Results  Component Value Date   WBC 1.1 (LL) 04/19/2018   NEUTROABS 0.7 (L) 04/19/2018   HGB 12.2 04/19/2018   HCT 38.8 04/19/2018   MCV 81.7 04/19/2018   PLT 126 (L) 04/19/2018     STUDIES: US Paracentesis  Result Date: 03/27/2018 INDICATION: Patient with history of peritoneal carcinomatosis, abdominal distention, recurrent ascites. Request is made for therapeutic paracentesis. EXAM: ULTRASOUND GUIDED tHERAPEUTIC PARACENTESIS MEDICATIONS: 10 mL 1% lidocaine COMPLICATIONS: None immediate. PROCEDURE: Informed written consent was obtained from the patient after a discussion of the risks, benefits and alternatives to treatment. A timeout was performed prior to the initiation of the procedure. Initial ultrasound scanning demonstrates a moderate amount of ascites within the left lateral abdomen. The left lateral abdomen was prepped and draped in the usual sterile fashion. 1% lidocaine was used for local anesthesia. Following this, a 19 gauge, 7-cm, Yueh catheter was introduced. An ultrasound image was saved for documentation purposes. The paracentesis was performed. The catheter was removed and a dressing was applied. The patient tolerated the procedure well without immediate post procedural complication. FINDINGS: A total of approximately 2.9 liters of dark, amber fluid was removed. IMPRESSION: Successful ultrasound-guided therapeutic paracentesis yielding 2.9 liters of peritoneal fluid. Read by: Brynda Greathouse PA-C Electronically Signed   By: Marybelle Killings M.D.   On: 03/27/2018 09:19   Dg Abd 2 Views  Result Date: 04/12/2018 CLINICAL DATA:  Abdominal pain with vomiting  and diarrhea. History of peritoneal carcinomatosis. EXAM: ABDOMEN - 2 VIEW COMPARISON:  CT abdomen pelvis dated January 27, 2018. FINDINGS: Focal loop of dilated, air-filled small bowel in the left abdomen. No air-fluid levels. Air and stool are seen within the colon. There is no evidence of free air. No radio-opaque calculi or other significant radiographic abnormality is seen. IMPRESSION: 1. Mildly dilated loop of small bowel in the left abdomen, nonspecific. This could reflect enteritis or partial obstruction. Electronically Signed   By: Titus Dubin M.D.   On: 04/12/2018 16:51  ASSESSMENT: Peritoneal carcinomatosis, metastatic adenocarcinoma of likely lower GI primary.  PLAN:    1. Peritoneal carcinomatosis: Given this and her worsening symptoms, she likely has progressive disease.  Rather than enrolling on clinical trial at [facility], patient initiated palliative chemotherapy using Irinotecan and Panitumumab only.  Her last treatment was on April 16, 2018.  Her next scheduled treatment is in 2 weeks on October 28th 2019, but patient may be more appropriate for hospice at this time.  Plan to admit patient to the hospital with palliative care consult. 2.  Pain: Patient currently on fentanyl patch and oxycodone.  She is unable to tolerate any p.o. medication, therefore she will require IV narcotics possibly by infusion pump. 3.  Abdominal pain: Given patient's extensive disease, there is concern for obstruction and patient will have a KUB later today.  4.  Neutropenia: Secondary to chemotherapy.  Patient has a low-grade fever of 100.1, therefore we will prophylactically initiate IV antibiotics. 5.  Hypokalemia: Resolved. 6.  Disposition: Admit to hospital today.  As above patient's next treatment is planned for approximately 2 weeks, but discharged home with hospice would also be appropriate if desired by the patient.     Lloyd Huger, MD 04/19/18 3:03 PM

## 2018-04-19 NOTE — Progress Notes (Signed)
[facility]  Telephone:([phone] [phone]   Name: Valerie Horton Date: 04/19/2018 MRN: [id number]  DOB: [birth year]  Patient Care Team: Valerie Roys, DO as PCP - General (Family Medicine) Clent Jacks, RN as Registered Nurse Garnetta Buddy, MD as Consulting Physician (Internal Medicine)    [place]: Palliative Care consult requested for this 57 y.o. female for goals of medical treatment in patient with multiple medical problems including stage IV peritoneal carcinomatosis initially diagnosed in July 2017. She is status post 12 cycles of FOLFOX (completed January 2018) with subsequent disease progression.  She was treated with 18 cycles of FOLFIRI beginning January 2018 and ending October 2019.  She restarted FOLFIRI July 2019 but had poor tolerance.  She is now being treated with irinotecan and Panitumumab.  Unfortunately patient has had evidence of progressive disease with malignant ascites requiring recent therapeutic paracentesis, poor oral intake, and subsequent weight loss.  Palliative care has been asked to help with symptom management, patient/family support, and to help address treatment goals.   SOCIAL HISTORY:   Patient lives at home with her husband and adult daughter.  She has two other adult children who do not live at home.  Patient formally worked as a Copywriter, advertising.  ADVANCE DIRECTIVES:  Patient's husband is her healthcare power of attorney.  Patient has completed a living will.  CODE STATUS: DNR  PAST MEDICAL HISTORY: Past Medical History:  Diagnosis Date  . Acid reflux   . Allergic rhinitis   . Anxiety   . Arthritis    left knee  . Cervical spondylosis   . CTS (carpal tunnel syndrome)    Right  . Diverticulosis   . HSV-2 (herpes simplex virus 2) infection   . Hyperlipidemia   . Ovarian failure   . Peritoneal carcinomatosis ([place]) 01/2016   chemo tx's.   . Rosacea   .  Wears contact lenses     PAST SURGICAL HISTORY:  Past Surgical History:  Procedure Laterality Date  . ANTERIOR CRUCIATE LIGAMENT REPAIR Left   . CESAREAN SECTION    . COLONOSCOPY WITH PROPOFOL N/A 01/29/2016   Procedure: COLONOSCOPY WITH PROPOFOL;  Surgeon: Lucilla Lame, MD;  Location: [facility];  Service: Endoscopy;  Laterality: N/A;  . COLONOSCOPY WITH PROPOFOL N/A 01/01/2018   Procedure: COLONOSCOPY WITH PROPOFOL;  Surgeon: Lucilla Lame, MD;  Location: [facility];  Service: Endoscopy;  Laterality: N/A;  . ESOPHAGOGASTRODUODENOSCOPY (EGD) WITH PROPOFOL N/A 01/29/2016   Procedure: ESOPHAGOGASTRODUODENOSCOPY (EGD) WITH PROPOFOL;  Surgeon: Lucilla Lame, MD;  Location: [facility];  Service: Endoscopy;  Laterality: N/A;  . ESOPHAGOGASTRODUODENOSCOPY (EGD) WITH PROPOFOL N/A 01/01/2018   Procedure: ESOPHAGOGASTRODUODENOSCOPY (EGD) WITH PROPOFOL;  Surgeon: Lucilla Lame, MD;  Location: [facility];  Service: Endoscopy;  Laterality: N/A;  . PERIPHERAL VASCULAR CATHETERIZATION N/A 02/10/2016   Procedure: Glori Luis Cath Insertion;  Surgeon: Algernon Huxley, MD;  Location: [facility];  Service: Cardiovascular;  Laterality: N/A;    HEMATOLOGY/ONCOLOGY HISTORY:  Oncology History   Patient with initial diagnosis in July 2017 where she presented to the emergency room for abdominal pain, bloating and fullness.  She was found to have ascites and a paracentesis was performed.  Ca 125 was elevated.  Omental biopsy was performed along with testing of the fluid from recent paracentesis.  Pathology revealed lower GI primary.  She was started on FOLFOX for 12 cycles.  Compleated 11 cycles on 07/21/2016.  Progression  of disease was noted and therapy was switched. Began FOLFIRI in January 2018.  Received 18 cycles completing in October 2018.  She required ultrasound-guided paracentesis more frequient.  She was not a surgical candidate. Foundation one testing did not reveal any actionable  mutations.  Restarted FOLFIRI chemotherapy in July 2019 with poor tolerance.      Peritoneal carcinomatosis ([place])   01/20/2016 Initial Diagnosis    Peritoneal carcinomatosis ([place])    03/30/2018 -  Chemotherapy    The patient had palonosetron (ALOXI) injection 0.25 mg, 0.25 mg, Intravenous,  Once, 2 of 6 cycles Administration: 0.25 mg (04/02/2018), 0.25 mg (04/16/2018) irinotecan (CAMPTOSAR) 300 mg in dextrose 5 % 500 mL chemo infusion, 180 mg/m2 = 300 mg, Intravenous,  Once, 2 of 6 cycles Administration: 300 mg (04/02/2018), 300 mg (04/16/2018) panitumumab (VECTIBIX) 400 mg in sodium chloride 0.9 % 100 mL chemo infusion, 6 mg/kg = 400 mg, Intravenous,  Once, 2 of 6 cycles Administration: 400 mg (04/02/2018), 400 mg (04/16/2018)  for chemotherapy treatment.      ALLERGIES:  has No Known Allergies.  MEDICATIONS:  Current Outpatient Medications  Medication Sig Dispense Refill  . acyclovir (ZOVIRAX) 800 MG tablet TAKE 1 TABLET BY MOUTH EVERY DAY 90 tablet 1  . Cholecalciferol 1000 units capsule Take 1,000 Units by mouth daily.     . clindamycin (CLINDAGEL) 1 % gel Apply topically 2 (two) times daily. 30 g 1  . diphenhydrAMINE (BENADRYL) 25 MG tablet Take 25 mg by mouth every 6 (six) hours as needed.    Marland Kitchen escitalopram (LEXAPRO) 10 MG tablet TAKE 1 TABLET BY MOUTH EVERY DAY 90 tablet 1  . fentaNYL (DURAGESIC - DOSED MCG/HR) 25 MCG/HR patch Place 1 patch (25 mcg total) onto the skin every 3 (three) days. 5 patch 0  . fluticasone (FLONASE) 50 MCG/ACT nasal spray Place into both nostrils daily.    Marland Kitchen gabapentin (NEURONTIN) 300 MG capsule Take 300 mg by mouth at bedtime.    Marland Kitchen HYDROcodone-acetaminophen (NORCO/VICODIN) 5-325 MG tablet TAKE 1 TABLET BY MOUTH EVERY 4 TO 6 HOURS AS NEEDED FOR PAIN WITH FOOD  0  . lidocaine-prilocaine (EMLA) cream Apply 1 application topically as needed. 30 g 3  . loperamide (IMODIUM) 2 MG capsule Take 2 capsules (4 mg total) by mouth as needed for diarrhea or loose  stools. 60 capsule 2  . meloxicam (MOBIC) 15 MG tablet Mobic 15 mg tablet  Take 1 tablet every day by oral route with meals.    . methocarbamol (ROBAXIN) 500 MG tablet Take 500 mg by mouth 2 (two) times daily.  0  . metoCLOPramide (REGLAN) 10 MG tablet Take 1 tablet (10 mg total) by mouth 3 (three) times daily before meals. 30 tablet 1  . naproxen (NAPROSYN) 500 MG tablet Take 1 tablet (500 mg total) by mouth 2 (two) times daily with a meal. 90 tablet 1  . ondansetron (ZOFRAN) 8 MG tablet Take 1 tablet (8 mg total) by mouth every 8 (eight) hours as needed for nausea or vomiting. 30 tablet 2  . ondansetron (ZOFRAN-ODT) 8 MG disintegrating tablet DISSOLVE 1 TABLET ON TONGUE EVERY 6 TO 8 HOURS AS NEEDED FOR NAUSEA OR VOMITING  0  . Oxycodone HCl 10 MG TABS Take 1 tablet (10 mg total) by mouth every 4 (four) hours as needed. 60 tablet 0  . oxyCODONE-acetaminophen (PERCOCET/ROXICET) 5-325 MG tablet Take 1-2 tablets by mouth every 4 (four) hours as needed for severe pain. 12 tablet 0  . pantoprazole (  PROTONIX) 40 MG tablet TAKE 1 TABLET BY MOUTH EVERY DAY 30 tablet 5  . polyethylene glycol (MIRALAX) packet Take 17 g by mouth daily. 14 each 0  . prochlorperazine (COMPAZINE) 10 MG tablet Take 1 tablet (10 mg total) by mouth every 6 (six) hours as needed for nausea or vomiting. 30 tablet 2   No current facility-administered medications for this visit.    Facility-Administered Medications Ordered in Other Visits  Medication Dose Route Frequency Provider Last Rate Last Dose  . 0.9 %  sodium chloride infusion   Intravenous Once Lloyd Huger, MD      . heparin lock flush 100 unit/mL  500 Units Intravenous Once Lloyd Huger, MD      . heparin lock flush 100 unit/mL  500 Units Intravenous Once Lloyd Huger, MD      . morphine 2 MG/ML injection 1 mg  1 mg Intravenous Once Lloyd Huger, MD      . sodium chloride flush (NS) 0.9 % injection 10 mL  10 mL Intravenous PRN Lloyd Huger, MD   10 mL at 08/16/16 0925    VITAL SIGNS: There were no vitals taken for this visit. There were no vitals filed for this visit.  Estimated body mass index is 24.8 kg/m as calculated from the following:   Height as of 04/09/18: 5\' 3"  (1.6 m).   Weight as of 04/16/18: 140 lb (63.5 kg).  LABS: CBC:    Component Value Date/Time   WBC 1.1 (LL) 04/19/2018 1340   HGB 12.2 04/19/2018 1340   HGB 12.0 09/28/2017 1417   HCT 38.8 04/19/2018 1340   HCT 35.3 09/28/2017 1417   PLT 126 (L) 04/19/2018 1340   PLT 309 09/28/2017 1417   MCV 81.7 04/19/2018 1340   MCV 84 09/28/2017 1417   NEUTROABS 0.7 (L) 04/19/2018 1340   NEUTROABS 3.9 09/28/2017 1417   LYMPHSABS 0.3 (L) 04/19/2018 1340   LYMPHSABS 1.7 09/28/2017 1417   MONOABS 0.1 04/19/2018 1340   EOSABS 0.0 04/19/2018 1340   EOSABS 0.0 09/28/2017 1417   BASOSABS 0.0 04/19/2018 1340   BASOSABS 0.0 09/28/2017 1417   Comprehensive Metabolic Panel:    Component Value Date/Time   NA 135 04/19/2018 1340   NA 139 09/28/2017 1417   K 3.7 04/19/2018 1340   CL 98 04/19/2018 1340   CO2 25 04/19/2018 1340   BUN 14 04/19/2018 1340   BUN 17 09/28/2017 1417   CREATININE 0.67 04/19/2018 1340   GLUCOSE 166 (H) 04/19/2018 1340   CALCIUM 8.8 (L) 04/19/2018 1340   AST 19 04/19/2018 1340   ALT 18 04/19/2018 1340   ALKPHOS 93 04/19/2018 1340   BILITOT 1.3 (H) 04/19/2018 1340   BILITOT <0.2 09/28/2017 1417   PROT 6.6 04/19/2018 1340   PROT 6.7 09/28/2017 1417   ALBUMIN 3.4 (L) 04/19/2018 1340   ALBUMIN 3.8 09/28/2017 1417    RADIOGRAPHIC STUDIES: US Paracentesis  Result Date: 03/27/2018 INDICATION: Patient with history of peritoneal carcinomatosis, abdominal distention, recurrent ascites. Request is made for therapeutic paracentesis. EXAM: ULTRASOUND GUIDED tHERAPEUTIC PARACENTESIS MEDICATIONS: 10 mL 1% lidocaine COMPLICATIONS: None immediate. PROCEDURE: Informed written consent was obtained from the patient after a discussion of  the risks, benefits and alternatives to treatment. A timeout was performed prior to the initiation of the procedure. Initial ultrasound scanning demonstrates a moderate amount of ascites within the left lateral abdomen. The left lateral abdomen was prepped and draped in the usual sterile fashion. 1% lidocaine  was used for local anesthesia. Following this, a 19 gauge, 7-cm, Yueh catheter was introduced. An ultrasound image was saved for documentation purposes. The paracentesis was performed. The catheter was removed and a dressing was applied. The patient tolerated the procedure well without immediate post procedural complication. FINDINGS: A total of approximately 2.9 liters of dark, amber fluid was removed. IMPRESSION: Successful ultrasound-guided therapeutic paracentesis yielding 2.9 liters of peritoneal fluid. Read by: Brynda Greathouse PA-C Electronically Signed   By: Marybelle Killings M.D.   On: 03/27/2018 09:19   Dg Abd 2 Views  Result Date: 04/12/2018 CLINICAL DATA:  Abdominal pain with vomiting and diarrhea. History of peritoneal carcinomatosis. EXAM: ABDOMEN - 2 VIEW COMPARISON:  CT abdomen pelvis dated January 27, 2018. FINDINGS: Focal loop of dilated, air-filled small bowel in the left abdomen. No air-fluid levels. Air and stool are seen within the colon. There is no evidence of free air. No radio-opaque calculi or other significant radiographic abnormality is seen. IMPRESSION: 1. Mildly dilated loop of small bowel in the left abdomen, nonspecific. This could reflect enteritis or partial obstruction. Electronically Signed   By: Titus Dubin M.D.   On: 04/12/2018 16:51    PERFORMANCE STATUS (ECOG) : 3 - Symptomatic, >50% confined to bed  Review of Systems As noted above. Otherwise, a complete review of systems is negative.  Physical Exam full exam deferred General: thin, frail appearing, sitting in chair Lungs: unlabored Musculoskeletal: No edema Neuro: Alert, answering all questions  appropriately. Cranial nerves grossly intact.  IMPRESSION: Patient presented today to the clinic with intractable nausea and vomiting over the past 48 hours, no bowel movement in past 5 days, persistent abdominal pain, and low-grade fever (T-max 100.1).  She was found to be neutropenic.  Case discussed with Dr. Grayland Ormond.  Patient is being admitted to the hospital for evaluation and management of symptoms.  Will plan to follow-up inpatient regarding symptom management and goals.  PLAN: 1.  Pending admission to the hospital for intractable nausea/vomiting and neutropenic fever 2.  Recommend reimaging abdomen to rule out obstruction 3.  Aggressive pain management and antiemetics as tolerated 4.  May need NGT for decompression 5.  Inpatient palliative care consult   Patient expressed understanding and was in agreement with this plan. She also understands that She can call clinic at any time with any questions, concerns, or complaints.    Time Total: 15 minutes  Visit consisted of counseling and education dealing with the complex and emotionally intense issues of symptom management and palliative care in the setting of serious and potentially life-threatening illness.Greater than 50%  of this time was spent counseling and coordinating care related to the above assessment and plan.  Signed by: Altha Harm, Daisy, NP-C, [place] (Work Cell)

## 2018-04-20 ENCOUNTER — Inpatient Hospital Stay: Payer: BLUE CROSS/BLUE SHIELD

## 2018-04-20 LAB — CBC
HCT: 31.3 % — ABNORMAL LOW (ref 36.0–46.0)
Hemoglobin: 10 g/dL — ABNORMAL LOW (ref 12.0–15.0)
MCH: 26.4 pg (ref 26.0–34.0)
MCHC: 31.9 g/dL (ref 30.0–36.0)
MCV: 82.6 fL (ref 80.0–100.0)
Platelets: 116 10*3/uL — ABNORMAL LOW (ref 150–400)
RBC: 3.79 MIL/uL — ABNORMAL LOW (ref 3.87–5.11)
RDW: 16.5 % — AB (ref 11.5–15.5)
WBC: 1.1 10*3/uL — AB (ref 4.0–10.5)
nRBC: 0 % (ref 0.0–0.2)

## 2018-04-20 MED ORDER — VANCOMYCIN HCL IN DEXTROSE 750-5 MG/150ML-% IV SOLN
750.0000 mg | Freq: Two times a day (BID) | INTRAVENOUS | Status: DC
  Administered 2018-04-20 – 2018-04-22 (×4): 750 mg via INTRAVENOUS
  Filled 2018-04-20 (×6): qty 150

## 2018-04-20 MED ORDER — OCTREOTIDE ACETATE 100 MCG/ML IJ SOLN
100.0000 ug | Freq: Every day | INTRAMUSCULAR | Status: DC
  Administered 2018-04-22 – 2018-04-24 (×3): 100 ug via INTRAVENOUS
  Filled 2018-04-20 (×9): qty 1

## 2018-04-20 MED ORDER — METOCLOPRAMIDE HCL 5 MG/ML IJ SOLN
10.0000 mg | Freq: Four times a day (QID) | INTRAMUSCULAR | Status: DC
  Filled 2018-04-20: qty 2

## 2018-04-20 MED ORDER — DEXAMETHASONE SODIUM PHOSPHATE 4 MG/ML IJ SOLN
4.0000 mg | Freq: Two times a day (BID) | INTRAMUSCULAR | Status: DC
  Administered 2018-04-20 – 2018-04-24 (×7): 4 mg via INTRAVENOUS
  Filled 2018-04-20 (×8): qty 1

## 2018-04-20 NOTE — Progress Notes (Signed)
I met with patient, husband, and daughter. We discussed her current medical problems at length including options for palliation of symptoms. We discussed options for workup, consultation with surgery, decompression with NGT vs venting G tube, and hospice care. Patient and family recognize that she is likely approaching end of life. Patient says her goal is to achieve symptom relief and comfort. Patient says she is not interested in future oncologic workup or treatment. They are not interested in surgical consultation but would consider a venting G tube for palliation. Therefore, I would recommend consultation with IR. Patient and family would like to pursue hospice at home.   Case discussed with supervising MD.  Plan: Continue aggressive antiemetics Will add octreotide, metoclopramide, and dexamethasone for comfort Patient does not want NGT presently but would consider if symptoms worsen over the weekend Patient would like to consult with IR for venting G tube.  Hospice referral

## 2018-04-20 NOTE — Progress Notes (Signed)
Pharmacy Antibiotic Note  Valerie Horton is a 57 y.o. female admitted on 04/19/2018 with febrile neutropenia.  Pharmacy has been consulted for Vancomycin dosing. Patient is already ordered Cefepime 2g IV q8h  Plan: Vancomycin 750mg  IV every 12 hours.  Goal trough 15-20 mcg/mL. Will check a trough prior to 5th dose.     Temp (24hrs), Avg:98.7 F (37.1 C), Min:97.8 F (36.6 C), Max:100.1 F (37.8 C)  Recent Labs  Lab 04/16/18 0838 04/19/18 1340 04/20/18 0552  WBC 5.7 1.1* 1.1*  CREATININE 0.72 0.67  --     Estimated Creatinine Clearance: 67.9 mL/min (by C-G formula based on SCr of 0.67 mg/dL).    No Known Allergies  Antimicrobials this admission: Cefepeime 10/17 >>  Vanc 10/18 >>   Microbiology results: 10/17 BCx:    Thank you for allowing pharmacy to be a part of this patient's care.  Paulina Fusi, PharmD, BCPS 04/20/2018 12:13 PM

## 2018-04-20 NOTE — Progress Notes (Signed)
Patient ID: Valerie Horton, female   DOB: [birth year], 59 y.o.   MRN: [id number]  [facility] PROGRESS NOTE  Valerie Horton KGY:[id number] DOB: [birth year] DOA: 04/19/2018 PCP: Park Liter P, DO  HPI/Subjective: Patient having abdominal pain nausea vomiting.  Has not had a bowel movement for a while.  Also had fever.  Patient states that she did not want to have a CAT scan.  Objective: Vitals:   04/20/18 0857 04/20/18 1349  BP: (!) 100/48 (!) 105/55  Pulse: 95 (!) 104  Resp:  20  Temp: 98.9 F (37.2 C) 98.8 F (37.1 C)  SpO2: 100% 95%    There were no vitals filed for this visit.  ROS: Review of Systems  Constitutional: Negative for chills and fever.  Eyes: Negative for blurred vision.  Respiratory: Negative for cough and shortness of breath.   Cardiovascular: Negative for chest pain.  Gastrointestinal: Positive for abdominal pain, constipation, nausea and vomiting. Negative for diarrhea.  Genitourinary: Negative for dysuria.  Musculoskeletal: Negative for joint pain.  Neurological: Negative for dizziness and headaches.   Exam: Physical Exam  HENT:  Nose: No mucosal edema.  Mouth/Throat: No oropharyngeal exudate or posterior oropharyngeal edema.  Eyes: Pupils are equal, round, and reactive to light. Conjunctivae, EOM and lids are normal.  Neck: No JVD present. Carotid bruit is not present. No edema present. No thyroid mass and no thyromegaly present.  Cardiovascular: S1 normal and S2 normal. Exam reveals no gallop.  No murmur heard. Pulses:      Dorsalis pedis pulses are 2+ on the right side, and 2+ on the left side.  Respiratory: No respiratory distress. She has decreased breath sounds in the right lower field and the left lower field. She has no wheezes. She has no rhonchi. She has no rales.  GI: Soft. She exhibits distension. Bowel sounds are decreased. There is tenderness.  Musculoskeletal:       Right ankle: She exhibits swelling.       Left ankle: She  exhibits swelling.  Lymphadenopathy:    She has no cervical adenopathy.  Neurological: She is alert. No cranial nerve deficit.  Skin: Skin is warm. No rash noted. Nails show no clubbing.  Psychiatric: She has a normal mood and affect.      Data Reviewed: Basic Metabolic Panel: Recent Labs  Lab 04/16/18 0838 04/19/18 1340  NA 134* 135  K 3.0* 3.7  CL 95* 98  CO2 27 25  GLUCOSE 113* 166*  BUN 7 14  CREATININE 0.72 0.67  CALCIUM 9.1 8.8*  MG 1.7 2.0   Liver Function Tests: Recent Labs  Lab 04/16/18 0838 04/19/18 1340  AST 19 19  ALT 13 18  ALKPHOS 103 93  BILITOT 0.8 1.3*  PROT 6.8 6.6  ALBUMIN 3.6 3.4*   CBC: Recent Labs  Lab 04/16/18 0838 04/19/18 1340 04/20/18 0552  WBC 5.7 1.1* 1.1*  NEUTROABS 3.3 0.7*  --   HGB 12.8 12.2 10.0*  HCT 40.5 38.8 31.3*  MCV 81.5 81.7 82.6  PLT 193 126* 116*    Recent Results (from the past 240 hour(s))  CULTURE, BLOOD (ROUTINE X 2) w Reflex to ID Panel     Status: None (Preliminary result)   Collection Time: 04/19/18  7:00 PM  Result Value Ref Range Status   Specimen Description BLOOD LEFT ANTECUBITAL  Final   Special Requests   Final    BOTTLES DRAWN AEROBIC AND ANAEROBIC Blood Culture results may not be optimal due to an  excessive volume of blood received in culture bottles   Culture   Final    NO GROWTH < 12 HOURS Performed at [facility], [address] [place], [place] [address]    Report Status PENDING  Incomplete  CULTURE, BLOOD (ROUTINE X 2) w Reflex to ID Panel     Status: None (Preliminary result)   Collection Time: 04/19/18  7:07 PM  Result Value Ref Range Status   Specimen Description BLOOD BLOOD RIGHT ARM  Final   Special Requests   Final    BOTTLES DRAWN AEROBIC AND ANAEROBIC Blood Culture results may not be optimal due to an excessive volume of blood received in culture bottles   Culture   Final    NO GROWTH < 12 HOURS Performed at [facility], [address] [place],  [place] [address]    Report Status PENDING  Incomplete     Studies: Dg Abd 2 Views  Result Date: 04/20/2018 CLINICAL DATA:  Stage IV peritoneal carcinomatosis diagnosed July 2017. Intractable nausea and vomiting with neutropenic fever. No bowel movement 4 days. EXAM: ABDOMEN - 2 VIEW COMPARISON:  04/12/2018 FINDINGS: Bowel gas pattern demonstrates a few air-filled dilated small bowel loops worse compared to the previous exam measuring up to 4.8 cm in diameter. There are a few scattered air-fluid levels. No evidence of free peritoneal air. Paucity of colonic air is present. No air seen distally over the rectum. Remainder of the exam is unchanged. IMPRESSION: Progression of air-filled dilated small bowel loops with less colonic air compatible with some degree of small bowel obstruction. Electronically Signed   By: Marin Olp M.D.   On: 04/20/2018 13:29    Scheduled Meds: . acyclovir  800 mg Oral QPM  . enoxaparin (LOVENOX) injection  40 mg Subcutaneous Q24H  . escitalopram  10 mg Oral QPM  . fentaNYL  50 mcg Transdermal Q72H  . meloxicam  15 mg Oral Daily   Continuous Infusions: . ceFEPime (MAXIPIME) IV 2 g (04/20/18 1032)  . dextrose 5 % and 0.45% NaCl 100 mL/hr at 04/20/18 1459  . famotidine (PEPCID) IV 20 mg (04/20/18 0814)  . vancomycin 750 mg (04/20/18 1500)    Assessment/Plan:  1. Peritoneal carcinomatosis with signs of small bowel obstruction on abdominal flat and upright x-ray.  Intractable nausea vomiting.  Patient deferred CT scan of the abdomen and NG tube at this time.  Appreciate palliative care consultation.  Likely will be a candidate for hospice.  Not a good surgical candidate.  Make n.p.o. and sips with meds and ice chips for now. 2. Pain.  Continue fentanyl patch and PRN IV medications. 3. Neutropenic fever on cefepime and vancomycin follow-up cultures 4. Anxiety depression on Lexapro  Code Status:     Code Status Orders  (From admission, onward)         Start      Ordered   04/19/18 1811  Do not attempt resuscitation (DNR)  Continuous    Question Answer Comment  In the event of cardiac or respiratory ARREST Do not call a "code blue"   In the event of cardiac or respiratory ARREST Do not perform Intubation, CPR, defibrillation or ACLS   In the event of cardiac or respiratory ARREST Use medication by any route, position, wound care, and other measures to relive pain and suffering. May use oxygen, suction and manual treatment of airway obstruction as needed for comfort.   Comments Nurse may pronounce      04/19/18 1810  Code Status History    This patient has a current code status but no historical code status.    Advance Directive Documentation     Most Recent Value  Type of Advance Directive  Healthcare Power of Attorney, Living will  Pre-existing out of facility DNR order (yellow form or pink MOST form)  -  "MOST" Form in Place?  -     Family Communication: Palliative care to speak with family Disposition Plan: Would be a candidate for hospice either at home or facility.  Consultants:  Oncology  Palliative care  Antibiotics:  Cefepime  Vancomycin  Time spent: 28 minutes  [place]

## 2018-04-20 NOTE — Progress Notes (Signed)
[facility]  [phone] [phone]   Name: Valerie Horton Date: 04/20/2018 MRN: [id number]  DOB: September 16, 1960  Patient Care Team: Valerie Roys, DO as PCP - General (Family Medicine) Clent Jacks, RN as Registered Nurse Garnetta Buddy, MD as Consulting Physician (Internal Medicine)    REASON FOR CONSULTATION: Palliative Care consult requested for this57 y.o.femalefor goals of medical treatment in patient with multiple medical problems including stage IV peritoneal carcinomatosis initially diagnosed in July 2017. She is status post 12 cycles of FOLFOX (completed January 2018) with subsequent disease progression. She was treated with 18 cycles of FOLFIRI beginning January 2018 and ending October 2019. She restarted FOLFIRI July 2019 but had poor tolerance. She is now being treated with irinotecan and Panitumumab. Unfortunately patient has had evidence of progressive disease with malignant ascites requiring recurrent therapeutic paracentesis, poor oral intake, and subsequent weight loss. Patient was admitted to the hospital on 04/19/18 due to intractable nausea and vomiting and neutropenic fever.  Palliative care has been asked to help address symptom management and clarify goals of care.  SOCIAL HISTORY:   Patient lives at home with her husband and adult daughter.  She has two other adult children who do not live at home.  Patient formally worked as a Copywriter, advertising.  ADVANCE DIRECTIVES:  Patient's husband is her healthcare power of attorney.  Patient has completed a living will.  CODE STATUS: DNR  PAST MEDICAL HISTORY: Past Medical History:  Diagnosis Date  . Acid reflux   . Allergic rhinitis   . Anxiety   . Arthritis    left knee  . Cervical spondylosis   . CTS (carpal tunnel syndrome)    Right  . Diverticulosis   . HSV-2 (herpes simplex virus 2) infection   . Hyperlipidemia   . Ovarian  failure   . Peritoneal carcinomatosis ([place]) 01/2016   chemo tx's.   . Rosacea   . Wears contact lenses     PAST SURGICAL HISTORY:  Past Surgical History:  Procedure Laterality Date  . ANTERIOR CRUCIATE LIGAMENT REPAIR Left   . CESAREAN SECTION    . COLONOSCOPY WITH PROPOFOL N/A 01/29/2016   Procedure: COLONOSCOPY WITH PROPOFOL;  Surgeon: Lucilla Lame, MD;  Location: [facility];  Service: Endoscopy;  Laterality: N/A;  . COLONOSCOPY WITH PROPOFOL N/A 01/01/2018   Procedure: COLONOSCOPY WITH PROPOFOL;  Surgeon: Lucilla Lame, MD;  Location: [facility];  Service: Endoscopy;  Laterality: N/A;  . ESOPHAGOGASTRODUODENOSCOPY (EGD) WITH PROPOFOL N/A 01/29/2016   Procedure: ESOPHAGOGASTRODUODENOSCOPY (EGD) WITH PROPOFOL;  Surgeon: Lucilla Lame, MD;  Location: [facility];  Service: Endoscopy;  Laterality: N/A;  . ESOPHAGOGASTRODUODENOSCOPY (EGD) WITH PROPOFOL N/A 01/01/2018   Procedure: ESOPHAGOGASTRODUODENOSCOPY (EGD) WITH PROPOFOL;  Surgeon: Lucilla Lame, MD;  Location: [facility];  Service: Endoscopy;  Laterality: N/A;  . PERIPHERAL VASCULAR CATHETERIZATION N/A 02/10/2016   Procedure: Glori Luis Cath Insertion;  Surgeon: Algernon Huxley, MD;  Location: [facility];  Service: Cardiovascular;  Laterality: N/A;    HEMATOLOGY/ONCOLOGY HISTORY:  Oncology History   Patient with initial diagnosis in July 2017 where she presented to the emergency room for abdominal pain, bloating and fullness.  She was found to have ascites and a paracentesis was performed.  Ca 125 was elevated.  Omental biopsy was performed along with testing of the fluid from recent paracentesis.  Pathology revealed lower GI primary.  She was started on FOLFOX for 12 cycles.  Compleated  11 cycles on 07/21/2016.  Progression of disease was noted and therapy was switched. Began FOLFIRI in January 2018.  Received 18 cycles completing in October 2018.  She required ultrasound-guided paracentesis more frequient.  She  was not a surgical candidate. Foundation one testing did not reveal any actionable mutations.  Restarted FOLFIRI chemotherapy in July 2019 with poor tolerance.      Peritoneal carcinomatosis ([facility])   01/20/2016 Initial Diagnosis    Peritoneal carcinomatosis ([facility])    03/30/2018 -  Chemotherapy    The patient had palonosetron (ALOXI) injection 0.25 mg, 0.25 mg, Intravenous,  Once, 2 of 6 cycles Administration: 0.25 mg (04/02/2018), 0.25 mg (04/16/2018) irinotecan (CAMPTOSAR) 300 mg in dextrose 5 % 500 mL chemo infusion, 180 mg/m2 = 300 mg, Intravenous,  Once, 2 of 6 cycles Administration: 300 mg (04/02/2018), 300 mg (04/16/2018) panitumumab (VECTIBIX) 400 mg in sodium chloride 0.9 % 100 mL chemo infusion, 6 mg/kg = 400 mg, Intravenous,  Once, 2 of 6 cycles Administration: 400 mg (04/02/2018), 400 mg (04/16/2018)  for chemotherapy treatment.      ALLERGIES:  has No Known Allergies.  MEDICATIONS:  Current Facility-Administered Medications  Medication Dose Route Frequency Provider Last Rate Last Dose  . acyclovir (ZOVIRAX) 200 MG capsule 800 mg  800 mg Oral QPM Salary, Montell D, MD   800 mg at 04/19/18 2033  . ceFEPIme (MAXIPIME) 2 g in sodium chloride 0.9 % 100 mL IVPB  2 g Intravenous Q8H Maccia, Melissa D, RPH 200 mL/hr at 04/20/18 1032 2 g at 04/20/18 1032  . dextrose 5 %-0.45 % sodium chloride infusion   Intravenous Continuous Salary, Montell D, MD 100 mL/hr at 04/20/18 1459    . diphenhydrAMINE (BENADRYL) capsule 25 mg  25 mg Oral Q6H PRN Salary, Montell D, MD      . enoxaparin (LOVENOX) injection 40 mg  40 mg Subcutaneous Q24H Salary, Montell D, MD   40 mg at 04/19/18 2055  . escitalopram (LEXAPRO) tablet 10 mg  10 mg Oral QPM Salary, Montell D, MD   10 mg at 04/19/18 2033  . famotidine (PEPCID) IVPB 20 mg premix  20 mg Intravenous Q12H Salary, Holly Bodily D, MD 100 mL/hr at 04/20/18 0814 20 mg at 04/20/18 0814  . fentaNYL (DURAGESIC - dosed mcg/hr) patch 50 mcg  50 mcg Transdermal Q72H  Salary, Holly Bodily D, MD   50 mcg at 04/19/18 2322  . HYDROcodone-acetaminophen (NORCO/VICODIN) 5-325 MG per tablet 1-2 tablet  1-2 tablet Oral Q4H PRN Salary, Montell D, MD      . HYDROmorphone (DILAUDID) injection 1 mg  1 mg Intravenous Q2H PRN Salary, Montell D, MD   1 mg at 04/19/18 2324  . loperamide (IMODIUM) capsule 4 mg  4 mg Oral PRN Salary, Montell D, MD      . meloxicam (MOBIC) tablet 15 mg  15 mg Oral Daily Salary, Montell D, MD      . oxyCODONE (Oxy IR/ROXICODONE) immediate release tablet 10 mg  10 mg Oral Q4H PRN Salary, Holly Bodily D, MD   10 mg at 04/20/18 0826  . prochlorperazine (COMPAZINE) injection 10 mg  10 mg Intravenous Q4H PRN Zakariyya Helfman, Kirt Boys, NP   10 mg at 04/20/18 0826  . promethazine (PHENERGAN) tablet 12.5 mg  12.5 mg Oral Q6H PRN Salary, Montell D, MD   12.5 mg at 04/20/18 0215  . vancomycin (VANCOCIN) IVPB 750 mg/150 ml premix  750 mg Intravenous Q12H Loletha Grayer, MD 150 mL/hr at 04/20/18 1500 750 mg at 04/20/18  1500   Facility-Administered Medications Ordered in Other Encounters  Medication Dose Route Frequency Provider Last Rate Last Dose  . 0.9 %  sodium chloride infusion   Intravenous Once Lloyd Huger, MD      . heparin lock flush 100 unit/mL  500 Units Intravenous Once Lloyd Huger, MD      . heparin lock flush 100 unit/mL  500 Units Intravenous Once Lloyd Huger, MD      . sodium chloride flush (NS) 0.9 % injection 10 mL  10 mL Intravenous PRN Lloyd Huger, MD   10 mL at 08/16/16 0925    VITAL SIGNS: BP (!) 105/55 (BP Location: Right Arm)   Pulse (!) 104   Temp 98.8 F (37.1 C) (Oral)   Resp 20   SpO2 95%  There were no vitals filed for this visit.  Estimated body mass index is 24.8 kg/m as calculated from the following:   Height as of 04/09/18: 5\' 3"  (1.6 m).   Weight as of 04/16/18: 140 lb (63.5 kg).  LABS: CBC:    Component Value Date/Time   WBC 1.1 (LL) 04/20/2018 0552   HGB 10.0 (L) 04/20/2018 0552   HGB 12.0  09/28/2017 1417   HCT 31.3 (L) 04/20/2018 0552   HCT 35.3 09/28/2017 1417   PLT 116 (L) 04/20/2018 0552   PLT 309 09/28/2017 1417   MCV 82.6 04/20/2018 0552   MCV 84 09/28/2017 1417   NEUTROABS 0.7 (L) 04/19/2018 1340   NEUTROABS 3.9 09/28/2017 1417   LYMPHSABS 0.3 (L) 04/19/2018 1340   LYMPHSABS 1.7 09/28/2017 1417   MONOABS 0.1 04/19/2018 1340   EOSABS 0.0 04/19/2018 1340   EOSABS 0.0 09/28/2017 1417   BASOSABS 0.0 04/19/2018 1340   BASOSABS 0.0 09/28/2017 1417   Comprehensive Metabolic Panel:    Component Value Date/Time   NA 135 04/19/2018 1340   NA 139 09/28/2017 1417   K 3.7 04/19/2018 1340   CL 98 04/19/2018 1340   CO2 25 04/19/2018 1340   BUN 14 04/19/2018 1340   BUN 17 09/28/2017 1417   CREATININE 0.67 04/19/2018 1340   GLUCOSE 166 (H) 04/19/2018 1340   CALCIUM 8.8 (L) 04/19/2018 1340   AST 19 04/19/2018 1340   ALT 18 04/19/2018 1340   ALKPHOS 93 04/19/2018 1340   BILITOT 1.3 (H) 04/19/2018 1340   BILITOT <0.2 09/28/2017 1417   PROT 6.6 04/19/2018 1340   PROT 6.7 09/28/2017 1417   ALBUMIN 3.4 (L) 04/19/2018 1340   ALBUMIN 3.8 09/28/2017 1417    RADIOGRAPHIC STUDIES: US Paracentesis  Result Date: 03/27/2018 INDICATION: Patient with history of peritoneal carcinomatosis, abdominal distention, recurrent ascites. Request is made for therapeutic paracentesis. EXAM: ULTRASOUND GUIDED tHERAPEUTIC PARACENTESIS MEDICATIONS: 10 mL 1% lidocaine COMPLICATIONS: None immediate. PROCEDURE: Informed written consent was obtained from the patient after a discussion of the risks, benefits and alternatives to treatment. A timeout was performed prior to the initiation of the procedure. Initial ultrasound scanning demonstrates a moderate amount of ascites within the left lateral abdomen. The left lateral abdomen was prepped and draped in the usual sterile fashion. 1% lidocaine was used for local anesthesia. Following this, a 19 gauge, 7-cm, Yueh catheter was introduced. An ultrasound  image was saved for documentation purposes. The paracentesis was performed. The catheter was removed and a dressing was applied. The patient tolerated the procedure well without immediate post procedural complication. FINDINGS: A total of approximately 2.9 liters of dark, amber fluid was removed. IMPRESSION: Successful ultrasound-guided therapeutic  paracentesis yielding 2.9 liters of peritoneal fluid. Read by: Brynda Greathouse PA-C Electronically Signed   By: Marybelle Killings M.D.   On: 03/27/2018 09:19   Dg Abd 2 Views  Result Date: 04/20/2018 CLINICAL DATA:  Stage IV peritoneal carcinomatosis diagnosed July 2017. Intractable nausea and vomiting with neutropenic fever. No bowel movement 4 days. EXAM: ABDOMEN - 2 VIEW COMPARISON:  04/12/2018 FINDINGS: Bowel gas pattern demonstrates a few air-filled dilated small bowel loops worse compared to the previous exam measuring up to 4.8 cm in diameter. There are a few scattered air-fluid levels. No evidence of free peritoneal air. Paucity of colonic air is present. No air seen distally over the rectum. Remainder of the exam is unchanged. IMPRESSION: Progression of air-filled dilated small bowel loops with less colonic air compatible with some degree of small bowel obstruction. Electronically Signed   By: Marin Olp M.D.   On: 04/20/2018 13:29   Dg Abd 2 Views  Result Date: 04/12/2018 CLINICAL DATA:  Abdominal pain with vomiting and diarrhea. History of peritoneal carcinomatosis. EXAM: ABDOMEN - 2 VIEW COMPARISON:  CT abdomen pelvis dated January 27, 2018. FINDINGS: Focal loop of dilated, air-filled small bowel in the left abdomen. No air-fluid levels. Air and stool are seen within the colon. There is no evidence of free air. No radio-opaque calculi or other significant radiographic abnormality is seen. IMPRESSION: 1. Mildly dilated loop of small bowel in the left abdomen, nonspecific. This could reflect enteritis or partial obstruction. Electronically Signed   By:  Titus Dubin M.D.   On: 04/12/2018 16:51    PERFORMANCE STATUS (ECOG) : 3 - Symptomatic, >50% confined to bed  Review of Systems As noted above. Otherwise, a complete review of systems is negative.  Physical Exam full exam deferred General: thin, frail appearing, sitting in chair Lungs: unlabored, CTA ant fields Heart: RRR ABD: slightly distended, hypoactive BS Musculoskeletal: No edema Neuro: Alert, answering all questions appropriately. Cranial nerves grossly intact.  IMPRESSION: KUB reveals progression of air-filled dilated small bowel loops compared to prior imaging one week ago.  Imaging now consistent with some degree of small bowel obstruction.  Obstruction is almost certainly malignant in etiology.  Case discussed with Drs. B[place] and [employer].  I doubt that a surgical consult would yield much benefit.  CT scan could be considered but would only likely confirm that obstruction is in the setting of progressive peritoneal carcinomatosis.  I spoke with patient and discussed options of conservative management versus gastric decompression, the latter of which could be achieved immediately with insertion of an NG tube.  We also discussed ongoing palliation of symptoms using a venting G-tube.    Patient asked me to prognosticate regarding her life expectancy.  She says she recognizes that she is approaching end-of-life.  We discussed options for continued treatment in the outpatient setting but patient says she likely would defer ongoing treatment given her recent decline.  Alternatively, we discussed a focus on symptom management, quality of life, and hospice.  Patient says she would likely want to pursue hospice care.  However, patient first wants to speak with her husband prior to making any decisions.  I will return later to meet with both patient and husband to clarify plan.  If patient opts for conservative management, will need ongoing scheduled antiemetics.  Consideration could  also be given for trial of octreotide.  PLAN: 1.  Continue aggressive antiemetics 2.  Consider gastric decompression 3.  We will follow-up with patient and husband regarding goals  with consideration of hospice care   Time Total: 35 minutes  Visit consisted of counseling and education dealing with the complex and emotionally intense issues of symptom management and palliative care in the setting of serious and potentially life-threatening illness.Greater than 50%  of this time was spent counseling and coordinating care related to the above assessment and plan.  Signed by: Altha Harm, [place], NP-C, [facility] (Work Cell)

## 2018-04-21 ENCOUNTER — Inpatient Hospital Stay: Payer: BLUE CROSS/BLUE SHIELD

## 2018-04-21 ENCOUNTER — Encounter: Payer: Self-pay | Admitting: Radiology

## 2018-04-21 LAB — HIV ANTIBODY (ROUTINE TESTING W REFLEX): HIV Screen 4th Generation wRfx: NONREACTIVE

## 2018-04-21 MED ORDER — ONDANSETRON HCL 4 MG/2ML IJ SOLN
4.0000 mg | Freq: Four times a day (QID) | INTRAMUSCULAR | Status: AC
  Administered 2018-04-21 – 2018-04-23 (×9): 4 mg via INTRAVENOUS
  Filled 2018-04-21 (×9): qty 2

## 2018-04-21 MED ORDER — IOPAMIDOL (ISOVUE-300) INJECTION 61%
100.0000 mL | Freq: Once | INTRAVENOUS | Status: AC | PRN
  Administered 2018-04-21: 14:00:00 100 mL via INTRAVENOUS

## 2018-04-21 NOTE — Progress Notes (Signed)
Patient ID: Valerie Horton, female   DOB: 11/29/1960, 57 y.o.   MRN: [id number]  [facility] PROGRESS NOTE  Valerie Horton GEX:[id number] DOB: [birth year] DOA: 04/19/2018 PCP: Park Liter P, DO  HPI/Subjective: Patient continues to be very nauseous anytime she tries to eat anything.   Objective: Vitals:   04/21/18 0943 04/21/18 1303  BP: (!) 112/58 (!) 108/56  Pulse: 94 98  Resp: 16 20  Temp: 98.6 F (37 C) 98.1 F (36.7 C)  SpO2: 97% 97%    There were no vitals filed for this visit.  ROS: Review of Systems  Constitutional: Negative for chills and fever.  Eyes: Negative for blurred vision.  Respiratory: Negative for cough and shortness of breath.   Cardiovascular: Negative for chest pain.  Gastrointestinal: Positive for abdominal pain, constipation, nausea and vomiting. Negative for diarrhea.  Genitourinary: Negative for dysuria.  Musculoskeletal: Negative for joint pain.  Neurological: Negative for dizziness and headaches.   Exam: Physical Exam  HENT:  Nose: No mucosal edema.  Mouth/Throat: No oropharyngeal exudate or posterior oropharyngeal edema.  Eyes: Pupils are equal, round, and reactive to light. Conjunctivae, EOM and lids are normal.  Neck: No JVD present. Carotid bruit is not present. No edema present. No thyroid mass and no thyromegaly present.  Cardiovascular: S1 normal and S2 normal. Exam reveals no gallop.  No murmur heard. Pulses:      Dorsalis pedis pulses are 2+ on the right side, and 2+ on the left side.  Respiratory: No respiratory distress. She has decreased breath sounds in the right lower field and the left lower field. She has no wheezes. She has no rhonchi. She has no rales.  GI: Soft. She exhibits distension. Bowel sounds are decreased. There is tenderness.  Musculoskeletal:       Right ankle: She exhibits swelling.       Left ankle: She exhibits swelling.  Lymphadenopathy:    She has no cervical adenopathy.  Neurological: She is  alert. No cranial nerve deficit.  Skin: Skin is warm. No rash noted. Nails show no clubbing.  Psychiatric: She has a normal mood and affect.      Data Reviewed: Basic Metabolic Panel: Recent Labs  Lab 04/16/18 0838 04/19/18 1340  NA 134* 135  K 3.0* 3.7  CL 95* 98  CO2 27 25  GLUCOSE 113* 166*  BUN 7 14  CREATININE 0.72 0.67  CALCIUM 9.1 8.8*  MG 1.7 2.0   Liver Function Tests: Recent Labs  Lab 04/16/18 0838 04/19/18 1340  AST 19 19  ALT 13 18  ALKPHOS 103 93  BILITOT 0.8 1.3*  PROT 6.8 6.6  ALBUMIN 3.6 3.4*   CBC: Recent Labs  Lab 04/16/18 0838 04/19/18 1340 04/20/18 0552  WBC 5.7 1.1* 1.1*  NEUTROABS 3.3 0.7*  --   HGB 12.8 12.2 10.0*  HCT 40.5 38.8 31.3*  MCV 81.5 81.7 82.6  PLT 193 126* 116*    Recent Results (from the past 240 hour(s))  CULTURE, BLOOD (ROUTINE X 2) w Reflex to ID Panel     Status: None (Preliminary result)   Collection Time: 04/19/18  7:00 PM  Result Value Ref Range Status   Specimen Description BLOOD LEFT ANTECUBITAL  Final   Special Requests   Final    BOTTLES DRAWN AEROBIC AND ANAEROBIC Blood Culture results may not be optimal due to an excessive volume of blood received in culture bottles   Culture   Final    NO GROWTH 2 DAYS  Performed at [facility], [address] [employer], [place] [address]    Report Status PENDING  Incomplete  CULTURE, BLOOD (ROUTINE X 2) w Reflex to ID Panel     Status: None (Preliminary result)   Collection Time: 04/19/18  7:07 PM  Result Value Ref Range Status   Specimen Description BLOOD BLOOD RIGHT ARM  Final   Special Requests   Final    BOTTLES DRAWN AEROBIC AND ANAEROBIC Blood Culture results may not be optimal due to an excessive volume of blood received in culture bottles   Culture   Final    NO GROWTH 2 DAYS Performed at [facility], [address], [place], [place] [address]    Report Status PENDING  Incomplete     Studies: Ct Abdomen Pelvis W Contrast  Result  Date: 04/21/2018 CLINICAL DATA:  Metastatic colon cancer, ascites, peritoneal carcinomatosis, obstruction symptoms EXAM: CT ABDOMEN AND PELVIS WITH CONTRAST TECHNIQUE: Multidetector CT imaging of the abdomen and pelvis was performed using the standard protocol following bolus administration of intravenous contrast. CONTRAST:  119mL ISOVUE-300 IOPAMIDOL (ISOVUE-300) INJECTION 61% COMPARISON:  01/27/2018 FINDINGS: Lower chest: Inferior right middle lobe and bibasilar atelectasis. Trace right pleural effusion. Normal heart size. No pericardial effusion. No hiatal hernia. Hepatobiliary: No focal hepatic abnormality. Hepatic portal veins are patent. No biliary obstruction. Moderate gallbladder distention. Common bile duct nondilated. Pancreas: Unremarkable. No pancreatic ductal dilatation or surrounding inflammatory changes. Spleen: Normal in size without focal abnormality. Adrenals/Urinary Tract: Adrenal glands are unremarkable. Kidneys are normal, without renal calculi, focal lesion, or hydronephrosis. Bladder is unremarkable. Stomach/Bowel: Increased distention of several mid and distal small bowel loops with air-fluid levels since the prior study compatible with developing small bowel obstruction. No free air evident. Moderate volume of abdominopelvic ascites. Progression of diffuse omental and peritoneal carcinomatosis with increased infiltration and nodularity in the left abdomen and central mesentery. No developing focal abscess, hemorrhage or hematoma. Distal rectal wall thickening as before consistent with the known rectal lesion. Known cecal lesion is not well appreciated by CT. Vascular/Lymphatic: Minor aortoiliac atherosclerosis. No occlusive process, aneurysm, dissection, or acute vascular finding. Mesenteric renal vasculature all remain patent. No veno-occlusive process. No adenopathy. Reproductive: Uterus and adnexa normal in size. Pelvic ascites noted. Other: No inguinal or abdominal wall hernia.  Musculoskeletal: No acute osseous finding. No abnormal osseous lesion. IMPRESSION: Slight progression of peritoneal carcinomatosis in the left abdomen. Moderate abdominopelvic ascites. Increased mid and distal small bowel fluid distension with air-fluid levels palpable with developing obstruction pattern. Known cecal mass not well demonstrated on today's exam. Rectal wall thickening noted compatible with a known rectal lesion previously biopsied. No free air evident or perforation. Negative for focal fluid collection or abscess. Electronically Signed   By: Jerilynn Mages.  Shick M.D.   On: 04/21/2018 15:04   Dg Abd 2 Views  Result Date: 04/20/2018 CLINICAL DATA:  Stage IV peritoneal carcinomatosis diagnosed July 2017. Intractable nausea and vomiting with neutropenic fever. No bowel movement 4 days. EXAM: ABDOMEN - 2 VIEW COMPARISON:  04/12/2018 FINDINGS: Bowel gas pattern demonstrates a few air-filled dilated small bowel loops worse compared to the previous exam measuring up to 4.8 cm in diameter. There are a few scattered air-fluid levels. No evidence of free peritoneal air. Paucity of colonic air is present. No air seen distally over the rectum. Remainder of the exam is unchanged. IMPRESSION: Progression of air-filled dilated small bowel loops with less colonic air compatible with some degree of small bowel obstruction. Electronically Signed  By: Marin Olp M.D.   On: 04/20/2018 13:29    Scheduled Meds: . acyclovir  800 mg Oral QPM  . dexamethasone  4 mg Intravenous Q12H  . enoxaparin (LOVENOX) injection  40 mg Subcutaneous Q24H  . escitalopram  10 mg Oral QPM  . fentaNYL  50 mcg Transdermal Q72H  . meloxicam  15 mg Oral Daily  . octreotide  100 mcg Intravenous Daily  . ondansetron (ZOFRAN) IV  4 mg Intravenous Q6H   Continuous Infusions: . ceFEPime (MAXIPIME) IV 2 g (04/21/18 0302)  . dextrose 5 % and 0.45% NaCl 100 mL/hr at 04/21/18 0534  . famotidine (PEPCID) IV 20 mg (04/21/18 0949)  . vancomycin  750 mg (04/21/18 1511)    Assessment/Plan:  1. Peritoneal carcinomatosis with signs of small bowel obstruction patient was agreeable to the CT scan of the abdomen which shows partial small bowel and large bowel obstruction we will try NG tube to suctioning 2. Abdominal pain.  Continue fentanyl patch and PRN IV medications.  Patient's cancer has progressed prognosis very poor 3. Neutropenic fever continue cefepime and vancomycin follow-up cultures 4. Anxiety depression on Lexapro  Code Status:     Code Status Orders  (From admission, onward)         Start     Ordered   04/19/18 1811  Do not attempt resuscitation (DNR)  Continuous    Question Answer Comment  In the event of cardiac or respiratory ARREST Do not call a "code blue"   In the event of cardiac or respiratory ARREST Do not perform Intubation, CPR, defibrillation or ACLS   In the event of cardiac or respiratory ARREST Use medication by any route, position, wound care, and other measures to relive pain and suffering. May use oxygen, suction and manual treatment of airway obstruction as needed for comfort.   Comments Nurse may pronounce      04/19/18 1810        Code Status History    This patient has a current code status but no historical code status.    Advance Directive Documentation     Most Recent Value  Type of Advance Directive  Healthcare Power of Attorney, Living will  Pre-existing out of facility DNR order (yellow form or pink MOST form)  -  "MOST" Form in Place?  -     Family Communication: Palliative care to speak with family Disposition Plan: Would be a candidate for hospice either at home or facility.  Consultants:  Oncology  Palliative care  Antibiotics:  Cefepime  Vancomycin  Time spent: 28 minutes  [place]

## 2018-04-21 NOTE — Progress Notes (Signed)
NG tube placed per order, chest x ray ordered for placement. Patient in no acute distress.

## 2018-04-21 NOTE — Plan of Care (Signed)

## 2018-04-22 DIAGNOSIS — C482 Malignant neoplasm of peritoneum, unspecified: Secondary | ICD-10-CM

## 2018-04-22 DIAGNOSIS — R112 Nausea with vomiting, unspecified: Secondary | ICD-10-CM

## 2018-04-22 LAB — BASIC METABOLIC PANEL
Anion gap: 7 (ref 5–15)
BUN: 6 mg/dL (ref 6–20)
CHLORIDE: 98 mmol/L (ref 98–111)
CO2: 29 mmol/L (ref 22–32)
Calcium: 7.7 mg/dL — ABNORMAL LOW (ref 8.9–10.3)
Creatinine, Ser: 0.44 mg/dL (ref 0.44–1.00)
GFR calc Af Amer: >60 mL/min (ref >60)
GFR calc non Af Amer: >60 mL/min (ref >60)
GLUCOSE: 138 mg/dL — AB (ref 70–99)
Potassium: 3.3 mmol/L — ABNORMAL LOW (ref 3.5–5.1)
SODIUM: 134 mmol/L — AB (ref 135–145)

## 2018-04-22 LAB — CBC
HEMATOCRIT: 28.9 % — AB (ref 36.0–46.0)
HEMOGLOBIN: 9.2 g/dL — AB (ref 12.0–15.0)
MCH: 25.7 pg — AB (ref 26.0–34.0)
MCHC: 31.8 g/dL (ref 30.0–36.0)
MCV: 80.7 fL (ref 80.0–100.0)
Platelets: 71 10*3/uL — ABNORMAL LOW (ref 150–400)
RBC: 3.58 MIL/uL — ABNORMAL LOW (ref 3.87–5.11)
RDW: 15.9 % — ABNORMAL HIGH (ref 11.5–15.5)
WBC: 1 10*3/uL — CL (ref 4.0–10.5)
nRBC: 0 % (ref 0.0–0.2)

## 2018-04-22 LAB — VANCOMYCIN, TROUGH: Vancomycin Tr: 4 ug/mL — ABNORMAL LOW (ref 15–20)

## 2018-04-22 MED ORDER — POTASSIUM CHLORIDE CRYS ER 20 MEQ PO TBCR
40.0000 meq | EXTENDED_RELEASE_TABLET | Freq: Once | ORAL | Status: AC
  Administered 2018-04-22: 40 meq via ORAL
  Filled 2018-04-22: qty 2

## 2018-04-22 MED ORDER — SIMETHICONE 80 MG PO CHEW
80.0000 mg | CHEWABLE_TABLET | Freq: Four times a day (QID) | ORAL | Status: DC
  Administered 2018-04-22 – 2018-04-25 (×12): 80 mg via ORAL
  Filled 2018-04-22 (×16): qty 1

## 2018-04-22 MED ORDER — BISACODYL 10 MG RE SUPP
10.0000 mg | Freq: Once | RECTAL | Status: AC
  Administered 2018-04-22: 10 mg via RECTAL
  Filled 2018-04-22: qty 1

## 2018-04-22 MED ORDER — RAMELTEON 8 MG PO TABS
8.0000 mg | ORAL_TABLET | Freq: Every day | ORAL | Status: DC
  Administered 2018-04-22 – 2018-04-25 (×4): 8 mg via ORAL
  Filled 2018-04-22 (×6): qty 1

## 2018-04-22 NOTE — Plan of Care (Signed)

## 2018-04-22 NOTE — Progress Notes (Signed)
Hematology/Oncology Consult note [facility]  Telephone:([phone] [phone]  Patient Care Team: Valerie Roys, DO as PCP - General (Family Medicine) Clent Jacks, RN as Registered Nurse Garnetta Buddy, MD as Consulting Physician (Internal Medicine)   Name of the patient: Valerie Horton  [id number]  1961-06-03   Date of visit: 04/22/2018  Interval history-NG tube is out and patient feels better.  She has not had any further nausea or vomiting.  She has had some small bowel movements.  She does report some weight abdominal discomfort.  Reports that the fentanyl patch is helping but desires as needed pain medicines as well.  States that the sleep medication that she got last night helped her sleep better.  Pain scale- 4  Review of systems- Review of Systems  Constitutional: Positive for malaise/fatigue. Negative for chills, fever and weight loss.  HENT: Negative for congestion, ear discharge and nosebleeds.   Eyes: Negative for blurred vision.  Respiratory: Negative for cough, hemoptysis, sputum production, shortness of breath and wheezing.   Cardiovascular: Negative for chest pain, palpitations, orthopnea and claudication.  Gastrointestinal: Positive for abdominal pain and nausea. Negative for blood in stool, constipation, diarrhea, heartburn, melena and vomiting.  Genitourinary: Negative for dysuria, flank pain, frequency, hematuria and urgency.  Musculoskeletal: Negative for back pain, joint pain and myalgias.  Skin: Negative for rash.  Neurological: Negative for dizziness, tingling, focal weakness, seizures, weakness and headaches.  Endo/Heme/Allergies: Does not bruise/bleed easily.  Psychiatric/Behavioral: Negative for depression and suicidal ideas. The patient does not have insomnia.      No Known Allergies   Past Medical History:  Diagnosis Date  . Acid reflux   . Allergic rhinitis   . Anxiety   . Arthritis    left knee    . Cervical spondylosis   . CTS (carpal tunnel syndrome)    Right  . Diverticulosis   . HSV-2 (herpes simplex virus 2) infection   . Hyperlipidemia   . Ovarian failure   . Peritoneal carcinomatosis ([place]) 01/2016   chemo tx's.   . Rosacea   . Wears contact lenses      Past Surgical History:  Procedure Laterality Date  . ANTERIOR CRUCIATE LIGAMENT REPAIR Left   . CESAREAN SECTION    . COLONOSCOPY WITH PROPOFOL N/A 01/29/2016   Procedure: COLONOSCOPY WITH PROPOFOL;  Surgeon: Lucilla Lame, MD;  Location: [place];  Service: Endoscopy;  Laterality: N/A;  . COLONOSCOPY WITH PROPOFOL N/A 01/01/2018   Procedure: COLONOSCOPY WITH PROPOFOL;  Surgeon: Lucilla Lame, MD;  Location: [place];  Service: Endoscopy;  Laterality: N/A;  . ESOPHAGOGASTRODUODENOSCOPY (EGD) WITH PROPOFOL N/A 01/29/2016   Procedure: ESOPHAGOGASTRODUODENOSCOPY (EGD) WITH PROPOFOL;  Surgeon: Lucilla Lame, MD;  Location: [place];  Service: Endoscopy;  Laterality: N/A;  . ESOPHAGOGASTRODUODENOSCOPY (EGD) WITH PROPOFOL N/A 01/01/2018   Procedure: ESOPHAGOGASTRODUODENOSCOPY (EGD) WITH PROPOFOL;  Surgeon: Lucilla Lame, MD;  Location: [place];  Service: Endoscopy;  Laterality: N/A;  . PERIPHERAL VASCULAR CATHETERIZATION N/A 02/10/2016   Procedure: Glori Luis Cath Insertion;  Surgeon: Algernon Huxley, MD;  Location: [place] [facility];  Service: Cardiovascular;  Laterality: N/A;    Social History   Socioeconomic History  . Marital status: Married    Spouse name: Not on file  . Number of children: 3  . Years of education: Not on file  . Highest education level: Not on file  Occupational History  . Occupation: Agricultural engineer  Social Needs  .  Financial resource strain: Not on file  . Food insecurity:    Worry: Not on file    Inability: Not on file  . Transportation needs:    Medical: Not on file    Non-medical: Not on file  Tobacco Use  . Smoking status: Former Research scientist (life sciences)  . Smokeless tobacco:  Never Used  . Tobacco comment: Quit in her 65's  Substance and Sexual Activity  . Alcohol use: Yes    Alcohol/week: 0.0 standard drinks    Comment: 1 drink/mo  . Drug use: No  . Sexual activity: Yes  Lifestyle  . Physical activity:    Days per week: Not on file    Minutes per session: Not on file  . Stress: Not on file  Relationships  . Social connections:    Talks on phone: Not on file    Gets together: Not on file    Attends religious service: Not on file    Active member of club or organization: Not on file    Attends meetings of clubs or organizations: Not on file    Relationship status: Not on file  . Intimate partner violence:    Fear of current or ex partner: Not on file    Emotionally abused: Not on file    Physically abused: Not on file    Forced sexual activity: Not on file  Other Topics Concern  . Not on file  Social History Narrative  . Not on file    Family History  Adopted: Yes     Current Facility-Administered Medications:  .  acyclovir (ZOVIRAX) 200 MG capsule 800 mg, 800 mg, Oral, QPM, Salary, Montell D, MD, 800 mg at 04/19/18 2033 .  ceFEPIme (MAXIPIME) 2 g in sodium chloride 0.9 % 100 mL IVPB, 2 g, Intravenous, Q8H, Maccia, Melissa D, RPH, Last Rate: 200 mL/hr at 04/22/18 1817, 2 g at 04/22/18 1817 .  dexamethasone (DECADRON) injection 4 mg, 4 mg, Intravenous, Q12H, Borders, Joshua R, NP, 4 mg at 04/22/18 1115 .  dextrose 5 %-0.45 % sodium chloride infusion, , Intravenous, Continuous, Salary, Montell D, MD, Last Rate: 100 mL/hr at 04/22/18 0812 .  diphenhydrAMINE (BENADRYL) capsule 25 mg, 25 mg, Oral, Q6H PRN, Salary, Montell D, MD .  enoxaparin (LOVENOX) injection 40 mg, 40 mg, Subcutaneous, Q24H, Salary, Montell D, MD, 40 mg at 04/21/18 2053 .  escitalopram (LEXAPRO) tablet 10 mg, 10 mg, Oral, QPM, Salary, Montell D, MD, 10 mg at 04/22/18 1818 .  famotidine (PEPCID) IVPB 20 mg premix, 20 mg, Intravenous, Q12H, Salary, Montell D, MD, Last Rate: 100  mL/hr at 04/22/18 1336, 20 mg at 04/22/18 1336 .  fentaNYL (DURAGESIC - dosed mcg/hr) patch 50 mcg, 50 mcg, Transdermal, Q72H, Salary, Montell D, MD, 50 mcg at 04/22/18 1821 .  HYDROcodone-acetaminophen (NORCO/VICODIN) 5-325 MG per tablet 1-2 tablet, 1-2 tablet, Oral, Q4H PRN, Salary, Montell D, MD, 1 tablet at 04/22/18 1818 .  HYDROmorphone (DILAUDID) injection 1 mg, 1 mg, Intravenous, Q2H PRN, Salary, Montell D, MD, 1 mg at 04/19/18 2324 .  loperamide (IMODIUM) capsule 4 mg, 4 mg, Oral, PRN, Salary, Montell D, MD .  meloxicam (MOBIC) tablet 15 mg, 15 mg, Oral, Daily, Salary, Montell D, MD .  octreotide (SANDOSTATIN) injection 100 mcg, 100 mcg, Intravenous, Daily, Borders, Vonna Kotyk R, NP .  ondansetron (ZOFRAN) injection 4 mg, 4 mg, Intravenous, Q6H, Dustin Flock, MD, 4 mg at 04/22/18 1330 .  oxyCODONE (Oxy IR/ROXICODONE) immediate release tablet 10 mg, 10 mg, Oral, Q4H PRN,  Loney Hering D, MD, 10 mg at 04/20/18 0826 .  prochlorperazine (COMPAZINE) injection 10 mg, 10 mg, Intravenous, Q4H PRN, Borders, Kirt Boys, NP, 10 mg at 04/20/18 0826 .  promethazine (PHENERGAN) tablet 12.5 mg, 12.5 mg, Oral, Q6H PRN, Salary, Montell D, MD, 12.5 mg at 04/20/18 2257 .  ramelteon (ROZEREM) tablet 8 mg, 8 mg, Oral, QHS, Lance Coon, MD, 8 mg at 04/22/18 0048 .  simethicone (MYLICON) chewable tablet 80 mg, 80 mg, Oral, QID, Dustin Flock, MD, 80 mg at 04/22/18 1818  Facility-Administered Medications Ordered in Other Encounters:  .  0.9 %  sodium chloride infusion, , Intravenous, Once, Finnegan, Kathlene November, MD .  heparin lock flush 100 unit/mL, 500 Units, Intravenous, Once, Finnegan, Kathlene November, MD .  heparin lock flush 100 unit/mL, 500 Units, Intravenous, Once, Finnegan, Kathlene November, MD .  sodium chloride flush (NS) 0.9 % injection 10 mL, 10 mL, Intravenous, PRN, Lloyd Huger, MD, 10 mL at 08/16/16 0925  Physical exam:  Vitals:   04/21/18 2021 04/22/18 0515 04/22/18 1113 04/22/18 1356  BP: (!)  115/53 (!) 108/57 (!) 105/59 (!) 100/53  Pulse: 92 87  79  Resp: 17 18 16 20   Temp: 98.2 F (36.8 C) 99.1 F (37.3 C) 98.9 F (37.2 C) 98.1 F (36.7 C)  TempSrc: Oral Oral Oral Oral  SpO2: 95% 94% 98% 100%   Physical Exam  Constitutional: She is oriented to person, place, and time. She appears well-developed and well-nourished.  HENT:  Head: Normocephalic and atraumatic.  Eyes: Pupils are equal, round, and reactive to light. EOM are normal.  Neck: Normal range of motion.  Cardiovascular: Normal rate, regular rhythm and normal heart sounds.  Pulmonary/Chest: Effort normal and breath sounds normal.  Abdominal: Soft. Bowel sounds are normal. She exhibits no distension. There is no tenderness.  Neurological: She is alert and oriented to person, place, and time.  Skin: Skin is warm and dry.     CMP Latest Ref Rng & Units 04/22/2018  Glucose 70 - 99 mg/dL 138(H)  BUN 6 - 20 mg/dL 6  Creatinine 0.44 - 1.00 mg/dL 0.44  Sodium 135 - 145 mmol/L 134(L)  Potassium 3.5 - 5.1 mmol/L 3.3(L)  Chloride 98 - 111 mmol/L 98  CO2 22 - 32 mmol/L 29  Calcium 8.9 - 10.3 mg/dL 7.7(L)  Total Protein 6.5 - 8.1 g/dL -  Total Bilirubin 0.3 - 1.2 mg/dL -  Alkaline Phos 38 - 126 U/L -  AST 15 - 41 U/L -  ALT 0 - 44 U/L -   CBC Latest Ref Rng & Units 04/22/2018  WBC 4.0 - 10.5 K/uL 1.0(LL)  Hemoglobin 12.0 - 15.0 g/dL 9.2(L)  Hematocrit 36.0 - 46.0 % 28.9(L)  Platelets 150 - 400 K/uL 71(L)    @IMAGES @  Dg Abd 1 View  Result Date: 04/21/2018 CLINICAL DATA:  NG tube placement EXAM: ABDOMEN - 1 VIEW COMPARISON:  04/20/2017 FINDINGS: NG tube tip is in the mid to distal stomach. Mild gaseous distention of central small bowel loops, stable. IMPRESSION: NG tube tip in the mid to distal stomach. Electronically Signed   By: Rolm Baptise M.D.   On: 04/21/2018 20:18   Ct Abdomen Pelvis W Contrast  Result Date: 04/21/2018 CLINICAL DATA:  Metastatic colon cancer, ascites, peritoneal carcinomatosis,  obstruction symptoms EXAM: CT ABDOMEN AND PELVIS WITH CONTRAST TECHNIQUE: Multidetector CT imaging of the abdomen and pelvis was performed using the standard protocol following bolus administration of intravenous contrast. CONTRAST:  161mL ISOVUE-300  IOPAMIDOL (ISOVUE-300) INJECTION 61% COMPARISON:  01/27/2018 FINDINGS: Lower chest: Inferior right middle lobe and bibasilar atelectasis. Trace right pleural effusion. Normal heart size. No pericardial effusion. No hiatal hernia. Hepatobiliary: No focal hepatic abnormality. Hepatic portal veins are patent. No biliary obstruction. Moderate gallbladder distention. Common bile duct nondilated. Pancreas: Unremarkable. No pancreatic ductal dilatation or surrounding inflammatory changes. Spleen: Normal in size without focal abnormality. Adrenals/Urinary Tract: Adrenal glands are unremarkable. Kidneys are normal, without renal calculi, focal lesion, or hydronephrosis. Bladder is unremarkable. Stomach/Bowel: Increased distention of several mid and distal small bowel loops with air-fluid levels since the prior study compatible with developing small bowel obstruction. No free air evident. Moderate volume of abdominopelvic ascites. Progression of diffuse omental and peritoneal carcinomatosis with increased infiltration and nodularity in the left abdomen and central mesentery. No developing focal abscess, hemorrhage or hematoma. Distal rectal wall thickening as before consistent with the known rectal lesion. Known cecal lesion is not well appreciated by CT. Vascular/Lymphatic: Minor aortoiliac atherosclerosis. No occlusive process, aneurysm, dissection, or acute vascular finding. Mesenteric renal vasculature all remain patent. No veno-occlusive process. No adenopathy. Reproductive: Uterus and adnexa normal in size. Pelvic ascites noted. Other: No inguinal or abdominal wall hernia. Musculoskeletal: No acute osseous finding. No abnormal osseous lesion. IMPRESSION: Slight progression  of peritoneal carcinomatosis in the left abdomen. Moderate abdominopelvic ascites. Increased mid and distal small bowel fluid distension with air-fluid levels palpable with developing obstruction pattern. Known cecal mass not well demonstrated on today's exam. Rectal wall thickening noted compatible with a known rectal lesion previously biopsied. No free air evident or perforation. Negative for focal fluid collection or abscess. Electronically Signed   By: Jerilynn Mages.  Shick M.D.   On: 04/21/2018 15:04   US Paracentesis  Result Date: 03/27/2018 INDICATION: Patient with history of peritoneal carcinomatosis, abdominal distention, recurrent ascites. Request is made for therapeutic paracentesis. EXAM: ULTRASOUND GUIDED tHERAPEUTIC PARACENTESIS MEDICATIONS: 10 mL 1% lidocaine COMPLICATIONS: None immediate. PROCEDURE: Informed written consent was obtained from the patient after a discussion of the risks, benefits and alternatives to treatment. A timeout was performed prior to the initiation of the procedure. Initial ultrasound scanning demonstrates a moderate amount of ascites within the left lateral abdomen. The left lateral abdomen was prepped and draped in the usual sterile fashion. 1% lidocaine was used for local anesthesia. Following this, a 19 gauge, 7-cm, Yueh catheter was introduced. An ultrasound image was saved for documentation purposes. The paracentesis was performed. The catheter was removed and a dressing was applied. The patient tolerated the procedure well without immediate post procedural complication. FINDINGS: A total of approximately 2.9 liters of dark, amber fluid was removed. IMPRESSION: Successful ultrasound-guided therapeutic paracentesis yielding 2.9 liters of peritoneal fluid. Read by: Brynda Greathouse PA-C Electronically Signed   By: Marybelle Killings M.D.   On: 03/27/2018 09:19   Dg Abd 2 Views  Result Date: 04/20/2018 CLINICAL DATA:  Stage IV peritoneal carcinomatosis diagnosed July 2017. Intractable  nausea and vomiting with neutropenic fever. No bowel movement 4 days. EXAM: ABDOMEN - 2 VIEW COMPARISON:  04/12/2018 FINDINGS: Bowel gas pattern demonstrates a few air-filled dilated small bowel loops worse compared to the previous exam measuring up to 4.8 cm in diameter. There are a few scattered air-fluid levels. No evidence of free peritoneal air. Paucity of colonic air is present. No air seen distally over the rectum. Remainder of the exam is unchanged. IMPRESSION: Progression of air-filled dilated small bowel loops with less colonic air compatible with some degree of small bowel obstruction.  Electronically Signed   By: Marin Olp M.D.   On: 04/20/2018 13:29   Dg Abd 2 Views  Result Date: 04/12/2018 CLINICAL DATA:  Abdominal pain with vomiting and diarrhea. History of peritoneal carcinomatosis. EXAM: ABDOMEN - 2 VIEW COMPARISON:  CT abdomen pelvis dated January 27, 2018. FINDINGS: Focal loop of dilated, air-filled small bowel in the left abdomen. No air-fluid levels. Air and stool are seen within the colon. There is no evidence of free air. No radio-opaque calculi or other significant radiographic abnormality is seen. IMPRESSION: 1. Mildly dilated loop of small bowel in the left abdomen, nonspecific. This could reflect enteritis or partial obstruction. Electronically Signed   By: Titus Dubin M.D.   On: 04/12/2018 16:51     Assessment and plan- Patient is a 57 y.o. female with peritoneal carcinomatosis from unknown primary likely lower GI last on irinotecan and Panitumumab admitted for malignant small bowel obstruction  Patient is doing better symptomatically after octreotide and Decadron was started.  She reports mild abdominal pain and mild nausea but has been able to keep some food down.  NG tube is out as well.  She would need a hospice informational visit and she is accepting of going home with hospice.  Patient may experience some recurrent episodes of small bowel obstruction in the future  given her peritoneal carcinomatosis.  However there is no immediate reason to putting a venting PEG at this time.  We can clarify this with hospice as well and they would be willing to get this inserted in the future should she experience medically refractory nausea and vomiting in the future requiring placement of an NG tube.   Total face to face encounter time for this patient visit was 30 min. >50% of the time was  spent in counseling and coordination of care.     Visit Diagnosis 1. Peritoneal carcinomatosis ([place])   2. Nausea & vomiting   3. Encounter for imaging study to confirm nasogastric (NG) tube placement      Dr. Randa Evens, MD, MPH [facility] [phone] 04/22/2018 6:30 PM

## 2018-04-22 NOTE — Progress Notes (Signed)
NG tube removed per order, patient tolerated well. 

## 2018-04-22 NOTE — Progress Notes (Signed)
Patient ID: Valerie Horton, female   DOB: 12-30-1960, 57 y.o.   MRN: [id number]  [facility] PROGRESS NOTE  Valerie Horton TKZ:[id number] DOB: [birth year] DOA: 04/19/2018 PCP: Park Liter P, DO  HPI/Subjective: Patient feeling better NG tube with no significant output   Objective: Vitals:   04/22/18 1113 04/22/18 1356  BP: (!) 105/59 (!) 100/53  Pulse:  79  Resp: 16 20  Temp: 98.9 F (37.2 C) 98.1 F (36.7 C)  SpO2: 98% 100%    There were no vitals filed for this visit.  ROS: Review of Systems  Constitutional: Negative for chills and fever.  Eyes: Negative for blurred vision.  Respiratory: Negative for cough and shortness of breath.   Cardiovascular: Negative for chest pain.  Gastrointestinal: Negative for abdominal pain, constipation, diarrhea, nausea and vomiting.  Genitourinary: Negative for dysuria.  Musculoskeletal: Negative for joint pain.  Neurological: Negative for dizziness and headaches.   Exam: Physical Exam  HENT:  Nose: No mucosal edema.  Mouth/Throat: No oropharyngeal exudate or posterior oropharyngeal edema.  Eyes: Pupils are equal, round, and reactive to light. Conjunctivae, EOM and lids are normal.  Neck: No JVD present. Carotid bruit is not present. No edema present. No thyroid mass and no thyromegaly present.  Cardiovascular: S1 normal and S2 normal. Exam reveals no gallop.  No murmur heard. Pulses:      Dorsalis pedis pulses are 2+ on the right side, and 2+ on the left side.  Respiratory: No respiratory distress. She has decreased breath sounds in the right lower field and the left lower field. She has no wheezes. She has no rhonchi. She has no rales.  GI: Soft. She exhibits no distension. Bowel sounds are decreased. There is no tenderness.  Musculoskeletal:       Right ankle: She exhibits swelling.       Left ankle: She exhibits swelling.  Lymphadenopathy:    She has no cervical adenopathy.  Neurological: She is alert. No cranial  nerve deficit.  Skin: Skin is warm. No rash noted. Nails show no clubbing.  Psychiatric: She has a normal mood and affect.      Data Reviewed: Basic Metabolic Panel: Recent Labs  Lab 04/16/18 0838 04/19/18 1340 04/22/18 0320  NA 134* 135 134*  K 3.0* 3.7 3.3*  CL 95* 98 98  CO2 27 25 29   GLUCOSE 113* 166* 138*  BUN 7 14 6   CREATININE 0.72 0.67 0.44  CALCIUM 9.1 8.8* 7.7*  MG 1.7 2.0  --    Liver Function Tests: Recent Labs  Lab 04/16/18 0838 04/19/18 1340  AST 19 19  ALT 13 18  ALKPHOS 103 93  BILITOT 0.8 1.3*  PROT 6.8 6.6  ALBUMIN 3.6 3.4*   CBC: Recent Labs  Lab 04/16/18 0838 04/19/18 1340 04/20/18 0552 04/22/18 0320  WBC 5.7 1.1* 1.1* 1.0*  NEUTROABS 3.3 0.7*  --   --   HGB 12.8 12.2 10.0* 9.2*  HCT 40.5 38.8 31.3* 28.9*  MCV 81.5 81.7 82.6 80.7  PLT 193 126* 116* 71*    Recent Results (from the past 240 hour(s))  CULTURE, BLOOD (ROUTINE X 2) w Reflex to ID Panel     Status: None (Preliminary result)   Collection Time: 04/19/18  7:00 PM  Result Value Ref Range Status   Specimen Description BLOOD LEFT ANTECUBITAL  Final   Special Requests   Final    BOTTLES DRAWN AEROBIC AND ANAEROBIC Blood Culture results may not be optimal due to an excessive  volume of blood received in culture bottles   Culture   Final    NO GROWTH 3 DAYS Performed at [facility], [address] [place], [place] [address]    Report Status PENDING  Incomplete  CULTURE, BLOOD (ROUTINE X 2) w Reflex to ID Panel     Status: None (Preliminary result)   Collection Time: 04/19/18  7:07 PM  Result Value Ref Range Status   Specimen Description BLOOD BLOOD RIGHT ARM  Final   Special Requests   Final    BOTTLES DRAWN AEROBIC AND ANAEROBIC Blood Culture results may not be optimal due to an excessive volume of blood received in culture bottles   Culture   Final    NO GROWTH 3 DAYS Performed at [facility], [address], [place], [place] [address]    Report  Status PENDING  Incomplete     Studies: Dg Abd 1 View  Result Date: 04/21/2018 CLINICAL DATA:  NG tube placement EXAM: ABDOMEN - 1 VIEW COMPARISON:  04/20/2017 FINDINGS: NG tube tip is in the mid to distal stomach. Mild gaseous distention of central small bowel loops, stable. IMPRESSION: NG tube tip in the mid to distal stomach. Electronically Signed   By: Rolm Baptise M.D.   On: 04/21/2018 20:18   Ct Abdomen Pelvis W Contrast  Result Date: 04/21/2018 CLINICAL DATA:  Metastatic colon cancer, ascites, peritoneal carcinomatosis, obstruction symptoms EXAM: CT ABDOMEN AND PELVIS WITH CONTRAST TECHNIQUE: Multidetector CT imaging of the abdomen and pelvis was performed using the standard protocol following bolus administration of intravenous contrast. CONTRAST:  171mL ISOVUE-300 IOPAMIDOL (ISOVUE-300) INJECTION 61% COMPARISON:  01/27/2018 FINDINGS: Lower chest: Inferior right middle lobe and bibasilar atelectasis. Trace right pleural effusion. Normal heart size. No pericardial effusion. No hiatal hernia. Hepatobiliary: No focal hepatic abnormality. Hepatic portal veins are patent. No biliary obstruction. Moderate gallbladder distention. Common bile duct nondilated. Pancreas: Unremarkable. No pancreatic ductal dilatation or surrounding inflammatory changes. Spleen: Normal in size without focal abnormality. Adrenals/Urinary Tract: Adrenal glands are unremarkable. Kidneys are normal, without renal calculi, focal lesion, or hydronephrosis. Bladder is unremarkable. Stomach/Bowel: Increased distention of several mid and distal small bowel loops with air-fluid levels since the prior study compatible with developing small bowel obstruction. No free air evident. Moderate volume of abdominopelvic ascites. Progression of diffuse omental and peritoneal carcinomatosis with increased infiltration and nodularity in the left abdomen and central mesentery. No developing focal abscess, hemorrhage or hematoma. Distal rectal wall  thickening as before consistent with the known rectal lesion. Known cecal lesion is not well appreciated by CT. Vascular/Lymphatic: Minor aortoiliac atherosclerosis. No occlusive process, aneurysm, dissection, or acute vascular finding. Mesenteric renal vasculature all remain patent. No veno-occlusive process. No adenopathy. Reproductive: Uterus and adnexa normal in size. Pelvic ascites noted. Other: No inguinal or abdominal wall hernia. Musculoskeletal: No acute osseous finding. No abnormal osseous lesion. IMPRESSION: Slight progression of peritoneal carcinomatosis in the left abdomen. Moderate abdominopelvic ascites. Increased mid and distal small bowel fluid distension with air-fluid levels palpable with developing obstruction pattern. Known cecal mass not well demonstrated on today's exam. Rectal wall thickening noted compatible with a known rectal lesion previously biopsied. No free air evident or perforation. Negative for focal fluid collection or abscess. Electronically Signed   By: Jerilynn Mages.  Shick M.D.   On: 04/21/2018 15:04    Scheduled Meds: . acyclovir  800 mg Oral QPM  . dexamethasone  4 mg Intravenous Q12H  . enoxaparin (LOVENOX) injection  40 mg Subcutaneous Q24H  .  escitalopram  10 mg Oral QPM  . fentaNYL  50 mcg Transdermal Q72H  . meloxicam  15 mg Oral Daily  . octreotide  100 mcg Intravenous Daily  . ondansetron (ZOFRAN) IV  4 mg Intravenous Q6H  . ramelteon  8 mg Oral QHS  . simethicone  80 mg Oral QID   Continuous Infusions: . ceFEPime (MAXIPIME) IV 2 g (04/22/18 1341)  . dextrose 5 % and 0.45% NaCl 100 mL/hr at 04/22/18 0812  . famotidine (PEPCID) IV 20 mg (04/22/18 1336)    Assessment/Plan:  1. Peritoneal carcinomatosis with partial small bowel obstruction no significant output from the NG tube discontinue NG tube patient is doing better we will start her on a clear liquid diet advance as tolerated 2. Abdominal pain.  Continue fentanyl patch and PRN IV medications.  Patient's  cancer has progressed prognosis very poor 3. Neutropenic fever continue cefepime and vancomycin follow-up cultures 4. Anxiety depression on Lexapro  Code Status:     Code Status Orders  (From admission, onward)         Start     Ordered   04/19/18 1811  Do not attempt resuscitation (DNR)  Continuous    Question Answer Comment  In the event of cardiac or respiratory ARREST Do not call a "code blue"   In the event of cardiac or respiratory ARREST Do not perform Intubation, CPR, defibrillation or ACLS   In the event of cardiac or respiratory ARREST Use medication by any route, position, wound care, and other measures to relive pain and suffering. May use oxygen, suction and manual treatment of airway obstruction as needed for comfort.   Comments Nurse may pronounce      04/19/18 1810        Code Status History    This patient has a current code status but no historical code status.    Advance Directive Documentation     Most Recent Value  Type of Advance Directive  Healthcare Power of Attorney, Living will  Pre-existing out of facility DNR order (yellow form or pink MOST form)  -  "MOST" Form in Place?  -     Family Communication: Palliative care to speak with family Disposition Plan: Would be a candidate for hospice either at home or facility.  Consultants:  Oncology  Palliative care  Antibiotics:  Cefepime  Vancomycin  Time spent: 28 minutes  [place]

## 2018-04-23 ENCOUNTER — Telehealth: Payer: Self-pay | Admitting: *Deleted

## 2018-04-23 ENCOUNTER — Inpatient Hospital Stay: Payer: BLUE CROSS/BLUE SHIELD

## 2018-04-23 ENCOUNTER — Other Ambulatory Visit: Payer: Self-pay | Admitting: Oncology

## 2018-04-23 DIAGNOSIS — R140 Abdominal distension (gaseous): Secondary | ICD-10-CM

## 2018-04-23 LAB — BASIC METABOLIC PANEL
ANION GAP: 4 — AB (ref 5–15)
BUN: 7 mg/dL (ref 6–20)
CALCIUM: 7.4 mg/dL — AB (ref 8.9–10.3)
CO2: 29 mmol/L (ref 22–32)
CREATININE: 0.38 mg/dL — AB (ref 0.44–1.00)
Chloride: 101 mmol/L (ref 98–111)
GLUCOSE: 185 mg/dL — AB (ref 70–99)
Potassium: 3.4 mmol/L — ABNORMAL LOW (ref 3.5–5.1)
Sodium: 134 mmol/L — ABNORMAL LOW (ref 135–145)

## 2018-04-23 LAB — CBC
HCT: 27.9 % — ABNORMAL LOW (ref 36.0–46.0)
Hemoglobin: 9 g/dL — ABNORMAL LOW (ref 12.0–15.0)
MCH: 26 pg (ref 26.0–34.0)
MCHC: 32.3 g/dL (ref 30.0–36.0)
MCV: 80.6 fL (ref 80.0–100.0)
NRBC: 0 % (ref 0.0–0.2)
PLATELETS: 74 10*3/uL — AB (ref 150–400)
RBC: 3.46 MIL/uL — ABNORMAL LOW (ref 3.87–5.11)
RDW: 16.1 % — AB (ref 11.5–15.5)
WBC: 1 10*3/uL — AB (ref 4.0–10.5)

## 2018-04-23 MED ORDER — TBO-FILGRASTIM 300 MCG/0.5ML ~~LOC~~ SOSY
300.0000 ug | PREFILLED_SYRINGE | Freq: Once | SUBCUTANEOUS | Status: AC
  Administered 2018-04-23: 300 ug via SUBCUTANEOUS
  Filled 2018-04-23: qty 0.5

## 2018-04-23 NOTE — Progress Notes (Signed)
Patient ID: Valerie Horton, female   DOB: 06-Mar-1961, 57 y.o.   MRN: [id number]  [facility] PROGRESS NOTE  Valerie Horton Valerie Horton DOB: Dec 27, 1960 Valerie Horton PCP: Park Liter P, DO  HPI/Subjective: Patient continues to be very nauseous.    Objective: Vitals:   04/23/18 1342 04/23/18 1412  BP: (!) 101/58 (!) 104/53  Pulse: 77 78  Resp:    Temp:    SpO2: 98% 100%    There were no vitals filed for this visit.  ROS: Review of Systems  Constitutional: Negative for chills and fever.  Eyes: Negative for blurred vision.  Respiratory: Negative for cough and shortness of breath.   Cardiovascular: Negative for chest pain.  Gastrointestinal: Positive for nausea. Negative for abdominal pain, constipation, diarrhea and vomiting.  Genitourinary: Negative for dysuria.  Musculoskeletal: Negative for joint pain.  Neurological: Negative for dizziness and headaches.   Exam: Physical Exam  HENT:  Nose: No mucosal edema.  Mouth/Throat: No oropharyngeal exudate or posterior oropharyngeal edema.  Eyes: Pupils are equal, round, and reactive to light. Conjunctivae, EOM and lids are normal.  Neck: No JVD present. Carotid bruit is not present. No edema present. No thyroid mass and no thyromegaly present.  Cardiovascular: S1 normal and S2 normal. Exam reveals no gallop.  No murmur heard. Pulses:      Dorsalis pedis pulses are 2+ on the right side, and 2+ on the left side.  Respiratory: No respiratory distress. She has decreased breath sounds in the right lower field and the left lower field. She has no wheezes. She has no rhonchi. She has no rales.  GI: Soft. She exhibits no distension. Bowel sounds are decreased. There is no tenderness.  Musculoskeletal:       Right ankle: She exhibits swelling.       Left ankle: She exhibits swelling.  Lymphadenopathy:    She has no cervical adenopathy.  Neurological: She is alert. No cranial nerve deficit.  Skin: Skin is warm. No rash  noted. Nails show no clubbing.  Psychiatric: She has a normal mood and affect.      Data Reviewed: Basic Metabolic Panel: Recent Labs  Lab 04/19/18 1340 04/22/18 0320 04/23/18 0616  NA 135 134* 134*  K 3.7 3.3* 3.4*  CL 98 98 101  CO2 25 29 29   GLUCOSE 166* 138* 185*  BUN 14 6 7   CREATININE 0.67 0.44 0.38*  CALCIUM 8.8* 7.7* 7.4*  MG 2.0  --   --    Liver Function Tests: Recent Labs  Lab 04/19/18 1340  AST 19  ALT 18  ALKPHOS 93  BILITOT 1.3*  PROT 6.6  ALBUMIN 3.4*   CBC: Recent Labs  Lab 04/19/18 1340 04/20/18 0552 04/22/18 0320 04/23/18 0616  WBC 1.1* 1.1* 1.0* 1.0*  NEUTROABS 0.7*  --   --   --   HGB 12.2 10.0* 9.2* 9.0*  HCT 38.8 31.3* 28.9* 27.9*  MCV 81.7 82.6 80.7 80.6  PLT 126* 116* 71* 74*    Recent Results (from the past 240 hour(s))  CULTURE, BLOOD (ROUTINE X 2) w Reflex to ID Panel     Status: None (Preliminary result)   Collection Time: 04/19/18  7:00 PM  Result Value Ref Range Status   Specimen Description BLOOD LEFT ANTECUBITAL  Final   Special Requests   Final    BOTTLES DRAWN AEROBIC AND ANAEROBIC Blood Culture results may not be optimal due to an excessive volume of blood received in culture bottles   Culture  Final    NO GROWTH 4 DAYS Performed at [facility], [address] [place], [place] [address]    Report Status PENDING  Incomplete  CULTURE, BLOOD (ROUTINE X 2) w Reflex to ID Panel     Status: None (Preliminary result)   Collection Time: 04/19/18  7:07 PM  Result Value Ref Range Status   Specimen Description BLOOD BLOOD RIGHT ARM  Final   Special Requests   Final    BOTTLES DRAWN AEROBIC AND ANAEROBIC Blood Culture results may not be optimal due to an excessive volume of blood received in culture bottles   Culture   Final    NO GROWTH 4 DAYS Performed at [facility], [address] [place], [place] [address]    Report Status PENDING  Incomplete     Studies: Dg Abd 1 View  Result Date:  04/21/2018 CLINICAL DATA:  NG tube placement EXAM: ABDOMEN - 1 VIEW COMPARISON:  04/20/2017 FINDINGS: NG tube tip is in the mid to distal stomach. Mild gaseous distention of central small bowel loops, stable. IMPRESSION: NG tube tip in the mid to distal stomach. Electronically Signed   By: Rolm Baptise M.D.   On: 04/21/2018 20:18   US Paracentesis  Result Date: 04/23/2018 INDICATION: History of metastatic colon cancer with peritoneal carcinomatosis with recurrent symptomatic intra-abdominal ascites. Please perform ultrasound-guided paracentesis for therapeutic purposes. EXAM: ULTRASOUND-GUIDED PARACENTESIS COMPARISON:  Ultrasound-guided paracentesis - 03/27/2018 (yielding 2.9 L of peritoneal fluid); CT abdomen and pelvis-04/21/2018 MEDICATIONS: None. COMPLICATIONS: None immediate. TECHNIQUE: Informed written consent was obtained from the patient after a discussion of the risks, benefits and alternatives to treatment. A timeout was performed prior to the initiation of the procedure. Initial ultrasound scanning demonstrates a small amount of ascites within the right lower abdominal quadrant. The right lower abdomen was prepped and draped in the usual sterile fashion. 1% lidocaine with epinephrine was used for local anesthesia. An ultrasound image was saved for documentation purposed. An 8 Fr Safe-T-Centesis catheter was introduced. The paracentesis was performed. The catheter was removed and a dressing was applied. The patient tolerated the procedure well without immediate post procedural complication. FINDINGS: A total of approximately 1 liters of serous fluid was removed. IMPRESSION: Successful ultrasound-guided paracentesis yielding 1 liter of peritoneal fluid. Electronically Signed   By: Sandi Mariscal M.D.   On: 04/23/2018 14:57    Scheduled Meds: . acyclovir  800 mg Oral QPM  . dexamethasone  4 mg Intravenous Q12H  . enoxaparin (LOVENOX) injection  40 mg Subcutaneous Q24H  . escitalopram  10 mg Oral  QPM  . fentaNYL  50 mcg Transdermal Q72H  . meloxicam  15 mg Oral Daily  . octreotide  100 mcg Intravenous Daily  . ondansetron (ZOFRAN) IV  4 mg Intravenous Q6H  . ramelteon  8 mg Oral QHS  . simethicone  80 mg Oral QID  . Tbo-Filgrastim  300 mcg Subcutaneous Once   Continuous Infusions: . dextrose 5 % and 0.45% NaCl 100 mL/hr at 04/23/18 1106  . famotidine (PEPCID) IV 20 mg (04/23/18 1112)    Assessment/Plan:  1. Peritoneal carcinomatosis with partial small bowel obstruction no significant output from the NG tube, patient continues to be very symptomatic, plan for paracentesis today due to patient unable to take much Horton.o. intake placement of PEG tube was brought up.  Family wants to discuss with palliative care before making any decisions continue supportive care for now due to patient continuing to have significant symptoms 2. Abdominal  pain.  Continue fentanyl patch and PRN IV medications.  Patient's cancer has progressed prognosis very poor 3. Neutropenic fever no further fever no source of infection identified discontinue IV antibiotic, patient will receive Neupogen 4. Anxiety depression on Lexapro  Code Status:     Code Status Orders  (From admission, onward)         Start     Ordered   04/19/18 1811  Do not attempt resuscitation (DNR)  Continuous    Question Answer Comment  In the event of cardiac or respiratory ARREST Do not call a "code blue"   In the event of cardiac or respiratory ARREST Do not perform Intubation, CPR, defibrillation or ACLS   In the event of cardiac or respiratory ARREST Use medication by any route, position, wound care, and other measures to relive pain and suffering. May use oxygen, suction and manual treatment of airway obstruction as needed for comfort.   Comments Nurse may pronounce      04/19/18 1810        Code Status History    This patient has a current code status but no historical code status.    Advance Directive Documentation      Most Recent Value  Type of Advance Directive  Healthcare Power of Attorney, Living will  Pre-existing out of facility DNR order (yellow form or pink MOST form)  -  "MOST" Form in Place?  -     Family Communication: Palliative care to speak with family Disposition Plan: Would be a candidate for hospice either at home or facility.  Consultants:  Oncology  Palliative care  Antibiotics:  Cefepime  Vancomycin  Time spent: 28 minutes  [place]

## 2018-04-23 NOTE — Progress Notes (Signed)
At patient's request, I returned to the room to meet with her and her husband.  They had some questions regarding insertion of the PEG for nutrition.  We discussed this at length.  I do not think that patient would significantly benefit from artificial nutrition via PEG.  Her problem is primarily related to nausea and vomiting from malignant bowel obstruction.  Bolused administration of nutrition into the stomach would only seem likely to exacerbate nausea and vomiting.  Alternatively, patient might benefit from venting PEG for gastric decompression.  We also talked about that option at length.  Patient will be discharging home with hospice and could consider venting PEG in the future if symptoms are not controllable with medications.  Currently, patient seems better symptomatically controlled and she is tolerating liquids.

## 2018-04-23 NOTE — Progress Notes (Signed)
Pt had 1 episode of vomiting and 1 small diarrhea episode. Pt BP 95/62, MD notified. Pt asymptomatic, A&Ox4. Other VS stable. Nurse continues to monitor.

## 2018-04-23 NOTE — Telephone Encounter (Signed)
Patient in hospital and referral has been made for hospice Services. Asking for orders from [facility] to open her to services

## 2018-04-23 NOTE — Progress Notes (Signed)
Hematology/Oncology Consult note [facility]  [phone] [phone]  Patient Care Team: Valerie Roys, DO as PCP - General (Family Medicine) Clent Jacks, RN as Registered Nurse Garnetta Buddy, MD as Consulting Physician (Internal Medicine)   Name of the patient: Valerie Horton  [id number]  10/31/1960   Date of visit: 04/23/2018  Interval history-   ECOG PS- 2 Pain scale- 3 Opioid associated constipation- yes  Review of systems- Review of Systems  Constitutional: Positive for malaise/fatigue.  Gastrointestinal: Positive for nausea.       Abdominal bloating       No Known Allergies   Past Medical History:  Diagnosis Date  . Acid reflux   . Allergic rhinitis   . Anxiety   . Arthritis    left knee  . Cervical spondylosis   . CTS (carpal tunnel syndrome)    Right  . Diverticulosis   . HSV-2 (herpes simplex virus 2) infection   . Hyperlipidemia   . Ovarian failure   . Peritoneal carcinomatosis ([place]) 01/2016   chemo tx's.   . Rosacea   . Wears contact lenses      Past Surgical History:  Procedure Laterality Date  . ANTERIOR CRUCIATE LIGAMENT REPAIR Left   . CESAREAN SECTION    . COLONOSCOPY WITH PROPOFOL N/A 01/29/2016   Procedure: COLONOSCOPY WITH PROPOFOL;  Surgeon: Lucilla Lame, MD;  Location: [place];  Service: Endoscopy;  Laterality: N/A;  . COLONOSCOPY WITH PROPOFOL N/A 01/01/2018   Procedure: COLONOSCOPY WITH PROPOFOL;  Surgeon: Lucilla Lame, MD;  Location: [place];  Service: Endoscopy;  Laterality: N/A;  . ESOPHAGOGASTRODUODENOSCOPY (EGD) WITH PROPOFOL N/A 01/29/2016   Procedure: ESOPHAGOGASTRODUODENOSCOPY (EGD) WITH PROPOFOL;  Surgeon: Lucilla Lame, MD;  Location: [place];  Service: Endoscopy;  Laterality: N/A;  . ESOPHAGOGASTRODUODENOSCOPY (EGD) WITH PROPOFOL N/A 01/01/2018   Procedure: ESOPHAGOGASTRODUODENOSCOPY (EGD) WITH PROPOFOL;  Surgeon: Lucilla Lame, MD;   Location: [place];  Service: Endoscopy;  Laterality: N/A;  . PERIPHERAL VASCULAR CATHETERIZATION N/A 02/10/2016   Procedure: Glori Luis Cath Insertion;  Surgeon: Algernon Huxley, MD;  Location: [place] [facility];  Service: Cardiovascular;  Laterality: N/A;    Social History   Socioeconomic History  . Marital status: Married    Spouse name: Not on file  . Number of children: 3  . Years of education: Not on file  . Highest education level: Not on file  Occupational History  . Occupation: Agricultural engineer  Social Needs  . Financial resource strain: Not on file  . Food insecurity:    Worry: Not on file    Inability: Not on file  . Transportation needs:    Medical: Not on file    Non-medical: Not on file  Tobacco Use  . Smoking status: Former Research scientist (life sciences)  . Smokeless tobacco: Never Used  . Tobacco comment: Quit in her 45's  Substance and Sexual Activity  . Alcohol use: Yes    Alcohol/week: 0.0 standard drinks    Comment: 1 drink/mo  . Drug use: No  . Sexual activity: Yes  Lifestyle  . Physical activity:    Days per week: Not on file    Minutes per session: Not on file  . Stress: Not on file  Relationships  . Social connections:    Talks on phone: Not on file    Gets together: Not on file    Attends religious service: Not on file    Active member of club  or organization: Not on file    Attends meetings of clubs or organizations: Not on file    Relationship status: Not on file  . Intimate partner violence:    Fear of current or ex partner: Not on file    Emotionally abused: Not on file    Physically abused: Not on file    Forced sexual activity: Not on file  Other Topics Concern  . Not on file  Social History Narrative  . Not on file    Family History  Adopted: Yes     Current Facility-Administered Medications:  .  acyclovir (ZOVIRAX) 200 MG capsule 800 mg, 800 mg, Oral, QPM, Salary, Montell D, MD, 800 mg at 04/19/18 2033 .  dexamethasone (DECADRON) injection 4 mg, 4  mg, Intravenous, Q12H, Borders, Joshua R, NP, 4 mg at 04/23/18 1112 .  dextrose 5 %-0.45 % sodium chloride infusion, , Intravenous, Continuous, Salary, Montell D, MD, Last Rate: 100 mL/hr at 04/23/18 1106 .  diphenhydrAMINE (BENADRYL) capsule 25 mg, 25 mg, Oral, Q6H PRN, Salary, Montell D, MD .  enoxaparin (LOVENOX) injection 40 mg, 40 mg, Subcutaneous, Q24H, Salary, Montell D, MD, 40 mg at 04/22/18 2143 .  escitalopram (LEXAPRO) tablet 10 mg, 10 mg, Oral, QPM, Salary, Montell D, MD, 10 mg at 04/22/18 1818 .  famotidine (PEPCID) IVPB 20 mg premix, 20 mg, Intravenous, Q12H, Salary, Montell D, MD, Last Rate: 100 mL/hr at 04/23/18 1112, 20 mg at 04/23/18 1112 .  fentaNYL (DURAGESIC - dosed mcg/hr) patch 50 mcg, 50 mcg, Transdermal, Q72H, Salary, Montell D, MD, 50 mcg at 04/22/18 1821 .  HYDROcodone-acetaminophen (NORCO/VICODIN) 5-325 MG per tablet 1-2 tablet, 1-2 tablet, Oral, Q4H PRN, Salary, Montell D, MD, 1 tablet at 04/22/18 1818 .  HYDROmorphone (DILAUDID) injection 1 mg, 1 mg, Intravenous, Q2H PRN, Salary, Montell D, MD, 1 mg at 04/19/18 2324 .  loperamide (IMODIUM) capsule 4 mg, 4 mg, Oral, PRN, Salary, Montell D, MD .  meloxicam (MOBIC) tablet 15 mg, 15 mg, Oral, Daily, Salary, Montell D, MD .  octreotide (SANDOSTATIN) injection 100 mcg, 100 mcg, Intravenous, Daily, Borders, Kirt Boys, NP, 100 mcg at 04/22/18 2255 .  ondansetron (ZOFRAN) injection 4 mg, 4 mg, Intravenous, Q6H, Dustin Flock, MD, 4 mg at 04/23/18 1542 .  oxyCODONE (Oxy IR/ROXICODONE) immediate release tablet 10 mg, 10 mg, Oral, Q4H PRN, Salary, Montell D, MD, 10 mg at 04/20/18 0826 .  prochlorperazine (COMPAZINE) injection 10 mg, 10 mg, Intravenous, Q4H PRN, Borders, Kirt Boys, NP, 10 mg at 04/20/18 0826 .  promethazine (PHENERGAN) tablet 12.5 mg, 12.5 mg, Oral, Q6H PRN, Salary, Montell D, MD, 12.5 mg at 04/20/18 2257 .  ramelteon (ROZEREM) tablet 8 mg, 8 mg, Oral, QHS, Lance Coon, MD, 8 mg at 04/22/18 2254 .  simethicone  (MYLICON) chewable tablet 80 mg, 80 mg, Oral, QID, Dustin Flock, MD, 80 mg at 04/23/18 1542  Facility-Administered Medications Ordered in Other Encounters:  .  0.9 %  sodium chloride infusion, , Intravenous, Once, Finnegan, Kathlene November, MD .  heparin lock flush 100 unit/mL, 500 Units, Intravenous, Once, Finnegan, Kathlene November, MD .  heparin lock flush 100 unit/mL, 500 Units, Intravenous, Once, Finnegan, Kathlene November, MD .  sodium chloride flush (NS) 0.9 % injection 10 mL, 10 mL, Intravenous, PRN, Lloyd Huger, MD, 10 mL at 08/16/16 0925  Physical exam:  Vitals:   04/23/18 0457 04/23/18 1229 04/23/18 1342 04/23/18 1412  BP: 95/62 (!) 96/57 (!) 101/58 (!) 104/53  Pulse: 73 72 77  78  Resp: 18 18    Temp: 97.8 F (36.6 C) 97.6 F (36.4 C)    TempSrc: Oral Oral    SpO2: 96% 100% 98% 100%   Physical Exam  Constitutional: She is oriented to person, place, and time.  Appears fatigued but no acute distress  HENT:  Head: Normocephalic and atraumatic.  Eyes: Pupils are equal, round, and reactive to light. EOM are normal.  Neck: Normal range of motion.  Cardiovascular: Normal rate, regular rhythm and normal heart sounds.  Pulmonary/Chest: Effort normal and breath sounds normal.  Abdominal: Soft. Bowel sounds are normal.  distended  Musculoskeletal: She exhibits edema.  Neurological: She is alert and oriented to person, place, and time.  Skin: Skin is warm and dry.     CMP Latest Ref Rng & Units 04/23/2018  Glucose 70 - 99 mg/dL 185(H)  BUN 6 - 20 mg/dL 7  Creatinine 0.44 - 1.00 mg/dL 0.38(L)  Sodium 135 - 145 mmol/L 134(L)  Potassium 3.5 - 5.1 mmol/L 3.4(L)  Chloride 98 - 111 mmol/L 101  CO2 22 - 32 mmol/L 29  Calcium 8.9 - 10.3 mg/dL 7.4(L)  Total Protein 6.5 - 8.1 g/dL -  Total Bilirubin 0.3 - 1.2 mg/dL -  Alkaline Phos 38 - 126 U/L -  AST 15 - 41 U/L -  ALT 0 - 44 U/L -   CBC Latest Ref Rng & Units 04/23/2018  WBC 4.0 - 10.5 K/uL 1.0(LL)  Hemoglobin 12.0 - 15.0 g/dL  9.0(L)  Hematocrit 36.0 - 46.0 % 27.9(L)  Platelets 150 - 400 K/uL 74(L)    @IMAGES @  Dg Abd 1 View  Result Date: 04/21/2018 CLINICAL DATA:  NG tube placement EXAM: ABDOMEN - 1 VIEW COMPARISON:  04/20/2017 FINDINGS: NG tube tip is in the mid to distal stomach. Mild gaseous distention of central small bowel loops, stable. IMPRESSION: NG tube tip in the mid to distal stomach. Electronically Signed   By: Rolm Baptise M.D.   On: 04/21/2018 20:18   Ct Abdomen Pelvis W Contrast  Result Date: 04/21/2018 CLINICAL DATA:  Metastatic colon cancer, ascites, peritoneal carcinomatosis, obstruction symptoms EXAM: CT ABDOMEN AND PELVIS WITH CONTRAST TECHNIQUE: Multidetector CT imaging of the abdomen and pelvis was performed using the standard protocol following bolus administration of intravenous contrast. CONTRAST:  111mL ISOVUE-300 IOPAMIDOL (ISOVUE-300) INJECTION 61% COMPARISON:  01/27/2018 FINDINGS: Lower chest: Inferior right middle lobe and bibasilar atelectasis. Trace right pleural effusion. Normal heart size. No pericardial effusion. No hiatal hernia. Hepatobiliary: No focal hepatic abnormality. Hepatic portal veins are patent. No biliary obstruction. Moderate gallbladder distention. Common bile duct nondilated. Pancreas: Unremarkable. No pancreatic ductal dilatation or surrounding inflammatory changes. Spleen: Normal in size without focal abnormality. Adrenals/Urinary Tract: Adrenal glands are unremarkable. Kidneys are normal, without renal calculi, focal lesion, or hydronephrosis. Bladder is unremarkable. Stomach/Bowel: Increased distention of several mid and distal small bowel loops with air-fluid levels since the prior study compatible with developing small bowel obstruction. No free air evident. Moderate volume of abdominopelvic ascites. Progression of diffuse omental and peritoneal carcinomatosis with increased infiltration and nodularity in the left abdomen and central mesentery. No developing focal  abscess, hemorrhage or hematoma. Distal rectal wall thickening as before consistent with the known rectal lesion. Known cecal lesion is not well appreciated by CT. Vascular/Lymphatic: Minor aortoiliac atherosclerosis. No occlusive process, aneurysm, dissection, or acute vascular finding. Mesenteric renal vasculature all remain patent. No veno-occlusive process. No adenopathy. Reproductive: Uterus and adnexa normal in size. Pelvic ascites noted. Other: No  inguinal or abdominal wall hernia. Musculoskeletal: No acute osseous finding. No abnormal osseous lesion. IMPRESSION: Slight progression of peritoneal carcinomatosis in the left abdomen. Moderate abdominopelvic ascites. Increased mid and distal small bowel fluid distension with air-fluid levels palpable with developing obstruction pattern. Known cecal mass not well demonstrated on today's exam. Rectal wall thickening noted compatible with a known rectal lesion previously biopsied. No free air evident or perforation. Negative for focal fluid collection or abscess. Electronically Signed   By: Jerilynn Mages.  Shick M.D.   On: 04/21/2018 15:04   US Paracentesis  Result Date: 04/23/2018 INDICATION: History of metastatic colon cancer with peritoneal carcinomatosis with recurrent symptomatic intra-abdominal ascites. Please perform ultrasound-guided paracentesis for therapeutic purposes. EXAM: ULTRASOUND-GUIDED PARACENTESIS COMPARISON:  Ultrasound-guided paracentesis - 03/27/2018 (yielding 2.9 L of peritoneal fluid); CT abdomen and pelvis-04/21/2018 MEDICATIONS: None. COMPLICATIONS: None immediate. TECHNIQUE: Informed written consent was obtained from the patient after a discussion of the risks, benefits and alternatives to treatment. A timeout was performed prior to the initiation of the procedure. Initial ultrasound scanning demonstrates a small amount of ascites within the right lower abdominal quadrant. The right lower abdomen was prepped and draped in the usual sterile  fashion. 1% lidocaine with epinephrine was used for local anesthesia. An ultrasound image was saved for documentation purposed. An 8 Fr Safe-T-Centesis catheter was introduced. The paracentesis was performed. The catheter was removed and a dressing was applied. The patient tolerated the procedure well without immediate post procedural complication. FINDINGS: A total of approximately 1 liters of serous fluid was removed. IMPRESSION: Successful ultrasound-guided paracentesis yielding 1 liter of peritoneal fluid. Electronically Signed   By: Sandi Mariscal M.D.   On: 04/23/2018 14:57   US Paracentesis  Result Date: 03/27/2018 INDICATION: Patient with history of peritoneal carcinomatosis, abdominal distention, recurrent ascites. Request is made for therapeutic paracentesis. EXAM: ULTRASOUND GUIDED tHERAPEUTIC PARACENTESIS MEDICATIONS: 10 mL 1% lidocaine COMPLICATIONS: None immediate. PROCEDURE: Informed written consent was obtained from the patient after a discussion of the risks, benefits and alternatives to treatment. A timeout was performed prior to the initiation of the procedure. Initial ultrasound scanning demonstrates a moderate amount of ascites within the left lateral abdomen. The left lateral abdomen was prepped and draped in the usual sterile fashion. 1% lidocaine was used for local anesthesia. Following this, a 19 gauge, 7-cm, Yueh catheter was introduced. An ultrasound image was saved for documentation purposes. The paracentesis was performed. The catheter was removed and a dressing was applied. The patient tolerated the procedure well without immediate post procedural complication. FINDINGS: A total of approximately 2.9 liters of dark, amber fluid was removed. IMPRESSION: Successful ultrasound-guided therapeutic paracentesis yielding 2.9 liters of peritoneal fluid. Read by: Brynda Greathouse PA-C Electronically Signed   By: Marybelle Killings M.D.   On: 03/27/2018 09:19   Dg Abd 2 Views  Result Date:  04/20/2018 CLINICAL DATA:  Stage IV peritoneal carcinomatosis diagnosed July 2017. Intractable nausea and vomiting with neutropenic fever. No bowel movement 4 days. EXAM: ABDOMEN - 2 VIEW COMPARISON:  04/12/2018 FINDINGS: Bowel gas pattern demonstrates a few air-filled dilated small bowel loops worse compared to the previous exam measuring up to 4.8 cm in diameter. There are a few scattered air-fluid levels. No evidence of free peritoneal air. Paucity of colonic air is present. No air seen distally over the rectum. Remainder of the exam is unchanged. IMPRESSION: Progression of air-filled dilated small bowel loops with less colonic air compatible with some degree of small bowel obstruction. Electronically Signed   By: Quillian Quince  Derrel Nip M.D.   On: 04/20/2018 13:29   Dg Abd 2 Views  Result Date: 04/12/2018 CLINICAL DATA:  Abdominal pain with vomiting and diarrhea. History of peritoneal carcinomatosis. EXAM: ABDOMEN - 2 VIEW COMPARISON:  CT abdomen pelvis dated January 27, 2018. FINDINGS: Focal loop of dilated, air-filled small bowel in the left abdomen. No air-fluid levels. Air and stool are seen within the colon. There is no evidence of free air. No radio-opaque calculi or other significant radiographic abnormality is seen. IMPRESSION: 1. Mildly dilated loop of small bowel in the left abdomen, nonspecific. This could reflect enteritis or partial obstruction. Electronically Signed   By: Titus Dubin M.D.   On: 04/12/2018 16:51     Assessment and plan- Patient is a 57 y.o. female  with peritoneal carcinomatosis from unknown primary likely lower GI last on irinotecan and Panitumumab admitted for malignant bowel obstruction  1. Patient had ice cream and coffee today and was able to keep it down. Her nausea vomiting that she had was from bowel obstruction and not due to chemotherapy. It has somewhat resolved with conservative measures. If may recur again the future at which time venting PEG may need to be  considered. PEG tube is not for her nutrition which would not help at the end of her life. She could be potentially discharged with home hospice and peg if need be needs to be figured out in the context of hospice but does not have to be done inpatient especially in the setting of neutropenia  2. Chemo induced neutropenia- she has not been febrile. No need to give neupogen if no invasive procedures are planned. It will likely resolve on its own in 1 week or so. Antibiotics may be discontinued upon discharge   Visit Diagnosis 1. Peritoneal carcinomatosis ([place])   2. Nausea & vomiting   3. Encounter for imaging study to confirm nasogastric (NG) tube placement   4. Abdominal distension      Dr. Randa Evens, MD, MPH [facility] [phone] 04/23/2018 4:45 PM

## 2018-04-23 NOTE — Progress Notes (Signed)
[place]  [phone] [phone]   Name: Valerie Horton Date: 04/23/2018 MRN: [id number]  DOB: 10/20/1960  Patient Care Team: Valerie Roys, DO as PCP - General (Family Medicine) Clent Jacks, RN as Registered Nurse Garnetta Buddy, MD as Consulting Physician (Internal Medicine)    REASON FOR CONSULTATION: Palliative Care consult requested for this57 y.o.femalefor goals of medical treatment in patient with multiple medical problems including stage IV peritoneal carcinomatosis initially diagnosed in July 2017. She is status post 12 cycles of FOLFOX (completed January 2018) with subsequent disease progression. She was treated with 18 cycles of FOLFIRI beginning January 2018 and ending October 2019. She restarted FOLFIRI July 2019 but had poor tolerance. She is now being treated with irinotecan and Panitumumab. Unfortunately patient has had evidence of progressive disease with malignant ascites requiring recurrent therapeutic paracentesis, poor oral intake, and subsequent weight loss. Patient was admitted to the hospital on 04/19/18 due to intractable nausea and vomiting and neutropenic fever.  Palliative care has been asked to help address symptom management and clarify goals of care.  CODE STATUS: DNR  PAST MEDICAL HISTORY: Past Medical History:  Diagnosis Date  . Acid reflux   . Allergic rhinitis   . Anxiety   . Arthritis    left knee  . Cervical spondylosis   . CTS (carpal tunnel syndrome)    Right  . Diverticulosis   . HSV-2 (herpes simplex virus 2) infection   . Hyperlipidemia   . Ovarian failure   . Peritoneal carcinomatosis ([place]) 01/2016   chemo tx's.   . Rosacea   . Wears contact lenses     PAST SURGICAL HISTORY:  Past Surgical History:  Procedure Laterality Date  . ANTERIOR CRUCIATE LIGAMENT REPAIR Left   . CESAREAN SECTION    . COLONOSCOPY WITH PROPOFOL N/A 01/29/2016   Procedure: COLONOSCOPY WITH PROPOFOL;  Surgeon: Lucilla Lame, MD;  Location: [place] [facility];  Service: Endoscopy;  Laterality: N/A;  . COLONOSCOPY WITH PROPOFOL N/A 01/01/2018   Procedure: COLONOSCOPY WITH PROPOFOL;  Surgeon: Lucilla Lame, MD;  Location: [facility];  Service: Endoscopy;  Laterality: N/A;  . ESOPHAGOGASTRODUODENOSCOPY (EGD) WITH PROPOFOL N/A 01/29/2016   Procedure: ESOPHAGOGASTRODUODENOSCOPY (EGD) WITH PROPOFOL;  Surgeon: Lucilla Lame, MD;  Location: [facility];  Service: Endoscopy;  Laterality: N/A;  . ESOPHAGOGASTRODUODENOSCOPY (EGD) WITH PROPOFOL N/A 01/01/2018   Procedure: ESOPHAGOGASTRODUODENOSCOPY (EGD) WITH PROPOFOL;  Surgeon: Lucilla Lame, MD;  Location: [facility];  Service: Endoscopy;  Laterality: N/A;  . PERIPHERAL VASCULAR CATHETERIZATION N/A 02/10/2016   Procedure: Glori Luis Cath Insertion;  Surgeon: Algernon Huxley, MD;  Location: [facility];  Service: Cardiovascular;  Laterality: N/A;    HEMATOLOGY/ONCOLOGY HISTORY:  Oncology History   Patient with initial diagnosis in July 2017 where she presented to the emergency room for abdominal pain, bloating and fullness.  She was found to have ascites and a paracentesis was performed.  Ca 125 was elevated.  Omental biopsy was performed along with testing of the fluid from recent paracentesis.  Pathology revealed lower GI primary.  She was started on FOLFOX for 12 cycles.  Compleated 11 cycles on 07/21/2016.  Progression of disease was noted and therapy was switched. Began FOLFIRI in January 2018.  Received 18 cycles completing in October 2018.  She required ultrasound-guided paracentesis more frequient.  She was not a surgical candidate. Foundation one testing did not reveal any actionable mutations.  Restarted FOLFIRI chemotherapy in July  2019 with poor tolerance.      Peritoneal carcinomatosis ([place])   01/20/2016 Initial Diagnosis    Peritoneal carcinomatosis ([place])    03/30/2018 - 04/29/2018  Chemotherapy    The patient had palonosetron (ALOXI) injection 0.25 mg, 0.25 mg, Intravenous,  Once, 2 of 6 cycles Administration: 0.25 mg (04/02/2018), 0.25 mg (04/16/2018) irinotecan (CAMPTOSAR) 300 mg in dextrose 5 % 500 mL chemo infusion, 180 mg/m2 = 300 mg, Intravenous,  Once, 2 of 6 cycles Administration: 300 mg (04/02/2018), 300 mg (04/16/2018) panitumumab (VECTIBIX) 400 mg in sodium chloride 0.9 % 100 mL chemo infusion, 6 mg/kg = 400 mg, Intravenous,  Once, 2 of 6 cycles Administration: 400 mg (04/02/2018), 400 mg (04/16/2018)  for chemotherapy treatment.      ALLERGIES:  has No Known Allergies.  MEDICATIONS:  Current Facility-Administered Medications  Medication Dose Route Frequency Provider Last Rate Last Dose  . acyclovir (ZOVIRAX) 200 MG capsule 800 mg  800 mg Oral QPM Salary, Montell D, MD   800 mg at 04/19/18 2033  . dexamethasone (DECADRON) injection 4 mg  4 mg Intravenous Q12H Hatsuko Bizzarro, Kirt Boys, NP   4 mg at 04/23/18 1112  . dextrose 5 %-0.45 % sodium chloride infusion   Intravenous Continuous Salary, Montell D, MD 100 mL/hr at 04/23/18 1106    . diphenhydrAMINE (BENADRYL) capsule 25 mg  25 mg Oral Q6H PRN Salary, Montell D, MD      . enoxaparin (LOVENOX) injection 40 mg  40 mg Subcutaneous Q24H Salary, Montell D, MD   40 mg at 04/22/18 2143  . escitalopram (LEXAPRO) tablet 10 mg  10 mg Oral QPM Salary, Montell D, MD   10 mg at 04/22/18 1818  . famotidine (PEPCID) IVPB 20 mg premix  20 mg Intravenous Q12H Salary, Montell D, MD 100 mL/hr at 04/23/18 1112 20 mg at 04/23/18 1112  . fentaNYL (DURAGESIC - dosed mcg/hr) patch 50 mcg  50 mcg Transdermal Q72H Salary, Montell D, MD   50 mcg at 04/22/18 1821  . HYDROcodone-acetaminophen (NORCO/VICODIN) 5-325 MG per tablet 1-2 tablet  1-2 tablet Oral Q4H PRN Salary, Avel Peace, MD   1 tablet at 04/22/18 1818  . HYDROmorphone (DILAUDID) injection 1 mg  1 mg Intravenous Q2H PRN Salary, Montell D, MD   1 mg at 04/19/18 2324  . loperamide  (IMODIUM) capsule 4 mg  4 mg Oral PRN Salary, Montell D, MD      . meloxicam (MOBIC) tablet 15 mg  15 mg Oral Daily Salary, Montell D, MD      . octreotide (SANDOSTATIN) injection 100 mcg  100 mcg Intravenous Daily Zsazsa Bahena, Kirt Boys, NP   100 mcg at 04/22/18 2255  . ondansetron (ZOFRAN) injection 4 mg  4 mg Intravenous Q6H Dustin Flock, MD   4 mg at 04/23/18 1126  . oxyCODONE (Oxy IR/ROXICODONE) immediate release tablet 10 mg  10 mg Oral Q4H PRN Salary, Holly Bodily D, MD   10 mg at 04/20/18 0826  . prochlorperazine (COMPAZINE) injection 10 mg  10 mg Intravenous Q4H PRN Ricketta Colantonio, Kirt Boys, NP   10 mg at 04/20/18 0826  . promethazine (PHENERGAN) tablet 12.5 mg  12.5 mg Oral Q6H PRN Salary, Montell D, MD   12.5 mg at 04/20/18 2257  . ramelteon (ROZEREM) tablet 8 mg  8 mg Oral Corwin Levins, MD   8 mg at 04/22/18 2254  . simethicone (MYLICON) chewable tablet 80 mg  80 mg Oral QID Dustin Flock, MD   80 mg at 04/23/18  1112  . Tbo-Filgrastim (GRANIX) injection 300 mcg  300 mcg Subcutaneous Once Dustin Flock, MD       Facility-Administered Medications Ordered in Other Encounters  Medication Dose Route Frequency Provider Last Rate Last Dose  . 0.9 %  sodium chloride infusion   Intravenous Once Lloyd Huger, MD      . heparin lock flush 100 unit/mL  500 Units Intravenous Once Lloyd Huger, MD      . heparin lock flush 100 unit/mL  500 Units Intravenous Once Lloyd Huger, MD      . sodium chloride flush (NS) 0.9 % injection 10 mL  10 mL Intravenous PRN Lloyd Huger, MD   10 mL at 08/16/16 0925    VITAL SIGNS: BP 95/62 (BP Location: Right Arm)   Pulse 73   Temp 97.8 F (36.6 C) (Oral)   Resp 18   SpO2 96%  There were no vitals filed for this visit.  Estimated body mass index is 24.8 kg/m as calculated from the following:   Height as of 04/09/18: 5\' 3"  (1.6 m).   Weight as of 04/16/18: 140 lb (63.5 kg).  LABS: CBC:    Component Value Date/Time   WBC 1.0 (LL)  04/23/2018 0616   HGB 9.0 (L) 04/23/2018 0616   HGB 12.0 09/28/2017 1417   HCT 27.9 (L) 04/23/2018 0616   HCT 35.3 09/28/2017 1417   PLT 74 (L) 04/23/2018 0616   PLT 309 09/28/2017 1417   MCV 80.6 04/23/2018 0616   MCV 84 09/28/2017 1417   NEUTROABS 0.7 (L) 04/19/2018 1340   NEUTROABS 3.9 09/28/2017 1417   LYMPHSABS 0.3 (L) 04/19/2018 1340   LYMPHSABS 1.7 09/28/2017 1417   MONOABS 0.1 04/19/2018 1340   EOSABS 0.0 04/19/2018 1340   EOSABS 0.0 09/28/2017 1417   BASOSABS 0.0 04/19/2018 1340   BASOSABS 0.0 09/28/2017 1417   Comprehensive Metabolic Panel:    Component Value Date/Time   NA 134 (L) 04/23/2018 0616   NA 139 09/28/2017 1417   K 3.4 (L) 04/23/2018 0616   CL 101 04/23/2018 0616   CO2 29 04/23/2018 0616   BUN 7 04/23/2018 0616   BUN 17 09/28/2017 1417   CREATININE 0.38 (L) 04/23/2018 0616   GLUCOSE 185 (H) 04/23/2018 0616   CALCIUM 7.4 (L) 04/23/2018 0616   AST 19 04/19/2018 1340   ALT 18 04/19/2018 1340   ALKPHOS 93 04/19/2018 1340   BILITOT 1.3 (H) 04/19/2018 1340   BILITOT <0.2 09/28/2017 1417   PROT 6.6 04/19/2018 1340   PROT 6.7 09/28/2017 1417   ALBUMIN 3.4 (L) 04/19/2018 1340   ALBUMIN 3.8 09/28/2017 1417    RADIOGRAPHIC STUDIES: Dg Abd 1 View  Result Date: 04/21/2018 CLINICAL DATA:  NG tube placement EXAM: ABDOMEN - 1 VIEW COMPARISON:  04/20/2017 FINDINGS: NG tube tip is in the mid to distal stomach. Mild gaseous distention of central small bowel loops, stable. IMPRESSION: NG tube tip in the mid to distal stomach. Electronically Signed   By: Rolm Baptise M.D.   On: 04/21/2018 20:18   Ct Abdomen Pelvis W Contrast  Result Date: 04/21/2018 CLINICAL DATA:  Metastatic colon cancer, ascites, peritoneal carcinomatosis, obstruction symptoms EXAM: CT ABDOMEN AND PELVIS WITH CONTRAST TECHNIQUE: Multidetector CT imaging of the abdomen and pelvis was performed using the standard protocol following bolus administration of intravenous contrast. CONTRAST:  133mL  ISOVUE-300 IOPAMIDOL (ISOVUE-300) INJECTION 61% COMPARISON:  01/27/2018 FINDINGS: Lower chest: Inferior right middle lobe and bibasilar atelectasis. Trace right pleural  effusion. Normal heart size. No pericardial effusion. No hiatal hernia. Hepatobiliary: No focal hepatic abnormality. Hepatic portal veins are patent. No biliary obstruction. Moderate gallbladder distention. Common bile duct nondilated. Pancreas: Unremarkable. No pancreatic ductal dilatation or surrounding inflammatory changes. Spleen: Normal in size without focal abnormality. Adrenals/Urinary Tract: Adrenal glands are unremarkable. Kidneys are normal, without renal calculi, focal lesion, or hydronephrosis. Bladder is unremarkable. Stomach/Bowel: Increased distention of several mid and distal small bowel loops with air-fluid levels since the prior study compatible with developing small bowel obstruction. No free air evident. Moderate volume of abdominopelvic ascites. Progression of diffuse omental and peritoneal carcinomatosis with increased infiltration and nodularity in the left abdomen and central mesentery. No developing focal abscess, hemorrhage or hematoma. Distal rectal wall thickening as before consistent with the known rectal lesion. Known cecal lesion is not well appreciated by CT. Vascular/Lymphatic: Minor aortoiliac atherosclerosis. No occlusive process, aneurysm, dissection, or acute vascular finding. Mesenteric renal vasculature all remain patent. No veno-occlusive process. No adenopathy. Reproductive: Uterus and adnexa normal in size. Pelvic ascites noted. Other: No inguinal or abdominal wall hernia. Musculoskeletal: No acute osseous finding. No abnormal osseous lesion. IMPRESSION: Slight progression of peritoneal carcinomatosis in the left abdomen. Moderate abdominopelvic ascites. Increased mid and distal small bowel fluid distension with air-fluid levels palpable with developing obstruction pattern. Known cecal mass not well  demonstrated on today's exam. Rectal wall thickening noted compatible with a known rectal lesion previously biopsied. No free air evident or perforation. Negative for focal fluid collection or abscess. Electronically Signed   By: Jerilynn Mages.  Shick M.D.   On: 04/21/2018 15:04   US Paracentesis  Result Date: 03/27/2018 INDICATION: Patient with history of peritoneal carcinomatosis, abdominal distention, recurrent ascites. Request is made for therapeutic paracentesis. EXAM: ULTRASOUND GUIDED tHERAPEUTIC PARACENTESIS MEDICATIONS: 10 mL 1% lidocaine COMPLICATIONS: None immediate. PROCEDURE: Informed written consent was obtained from the patient after a discussion of the risks, benefits and alternatives to treatment. A timeout was performed prior to the initiation of the procedure. Initial ultrasound scanning demonstrates a moderate amount of ascites within the left lateral abdomen. The left lateral abdomen was prepped and draped in the usual sterile fashion. 1% lidocaine was used for local anesthesia. Following this, a 19 gauge, 7-cm, Yueh catheter was introduced. An ultrasound image was saved for documentation purposes. The paracentesis was performed. The catheter was removed and a dressing was applied. The patient tolerated the procedure well without immediate post procedural complication. FINDINGS: A total of approximately 2.9 liters of dark, amber fluid was removed. IMPRESSION: Successful ultrasound-guided therapeutic paracentesis yielding 2.9 liters of peritoneal fluid. Read by: Brynda Greathouse PA-C Electronically Signed   By: Marybelle Killings M.D.   On: 03/27/2018 09:19   Dg Abd 2 Views  Result Date: 04/20/2018 CLINICAL DATA:  Stage IV peritoneal carcinomatosis diagnosed July 2017. Intractable nausea and vomiting with neutropenic fever. No bowel movement 4 days. EXAM: ABDOMEN - 2 VIEW COMPARISON:  04/12/2018 FINDINGS: Bowel gas pattern demonstrates a few air-filled dilated small bowel loops worse compared to the  previous exam measuring up to 4.8 cm in diameter. There are a few scattered air-fluid levels. No evidence of free peritoneal air. Paucity of colonic air is present. No air seen distally over the rectum. Remainder of the exam is unchanged. IMPRESSION: Progression of air-filled dilated small bowel loops with less colonic air compatible with some degree of small bowel obstruction. Electronically Signed   By: Marin Olp M.D.   On: 04/20/2018 13:29   Dg Abd 2 Views  Result Date: 04/12/2018 CLINICAL DATA:  Abdominal pain with vomiting and diarrhea. History of peritoneal carcinomatosis. EXAM: ABDOMEN - 2 VIEW COMPARISON:  CT abdomen pelvis dated January 27, 2018. FINDINGS: Focal loop of dilated, air-filled small bowel in the left abdomen. No air-fluid levels. Air and stool are seen within the colon. There is no evidence of free air. No radio-opaque calculi or other significant radiographic abnormality is seen. IMPRESSION: 1. Mildly dilated loop of small bowel in the left abdomen, nonspecific. This could reflect enteritis or partial obstruction. Electronically Signed   By: Titus Dubin M.D.   On: 04/12/2018 16:51    PERFORMANCE STATUS (ECOG) : 3 - Symptomatic, >50% confined to bed  Review of Systems As noted above. Otherwise, a complete review of systems is negative.  Physical Exam full exam deferred General: thin, frail appearing, sitting in chair Lungs: unlabored, CTA ant fields Heart: RRR ABD: slightly distended, hypoactive BS Musculoskeletal: No edema Neuro: Alert, answering all questions appropriately. Cranial nerves grossly intact.  IMPRESSION: Weekend notes reviewed.  Abdominal CT revealed slight progression of peritoneal carcinomatosis, moderate ascites, and developing obstruction pattern.  NGT was placed over the weekend for gastric decompression but has subsequently been removed.  Does not appear that patient had significant residual volume.  Symptomatically, patient appears to have  improved nausea and vomiting on octreotide, dexamethasone, and metoclopramide.  Today, she is tolerating liquids and seems to be in brighter spirits.  I spoke with patient and husband today.  Both still desire for her to return home under hospice care.  We discussed risk of progressive obstruction.  Patient might benefit at some point in the future from a venting PEG.   Case discussed with care management who will offer hospice.  Case discussed with attending.  PLAN: 1.  Continue aggressive antiemetics 2.  Care management to offer hospice choice   Time Total: 25 minutes  Visit consisted of counseling and education dealing with the complex and emotionally intense issues of symptom management and palliative care in the setting of serious and potentially life-threatening illness.Greater than 50%  of this time was spent counseling and coordinating care related to the above assessment and plan.  Signed by: Altha Harm, [place], NP-C, [place] (Work Cell)

## 2018-04-23 NOTE — Procedures (Signed)
Pre Procedural Dx: Peritoneal carcinomatosis with recurrent symptomatic Ascites Post Procedural Dx: Same  Successful US guided paracentesis yielding 1 L of serous ascitic fluid.  EBL: None  Complications: None immediate  Ronny Bacon, MD Pager #: [phone]

## 2018-04-23 NOTE — Care Management Note (Signed)
Case Management Note  Patient Details  Name: Valerie Horton MRN: [id number] Date of Birth: [birth year]  Subjective/Objective:      Admitted to [facility] with the diagnosis of peritoneal carcinoma. Lives with husband, Legrand Como [phone]) Seen [facility] for primary care services about 8 months ago. Prescriptions are filled at [facility]. No home Health. No skilled facility. No medical equipment in the home. Takes care of all basic activities of daily living herself, can drive if needed. No falls. Fair appetite.  Received referral for Hospice services in the home.              Action/Plan: Discussed different Hospice agencies in the home,. [facility]. Flo Shanks RN representative updated  Expected Discharge Date:                  Expected Discharge Plan:     In-House Referral:    yes Discharge planning Services   yes  Post Acute Care Choice:   yes Choice offered to:   Ms Nonnie Done  DME Arranged:    DME Agency:     [facility] Arranged:   yes [place] Agency:   [facility]  Status of Service:     If discussed at [employer] of Stay Meetings, dates discussed:    Additional Comments:  Shelbie Ammons, RN MSN CCM Care Management [phone] 04/23/2018, 12:25 PM

## 2018-04-23 NOTE — Progress Notes (Signed)
New referral for [facility] at home received from [facility].Patient information faxed to referral. Plan to follow up with patient/family on 10/22. Per CMRN no immediate DME needs for discharge. Flo Shanks RN, BSN, [place] [facility], hospital Liaison [phone]

## 2018-04-24 LAB — CULTURE, BLOOD (ROUTINE X 2)
Culture: NO GROWTH
Culture: NO GROWTH

## 2018-04-24 LAB — BASIC METABOLIC PANEL
Anion gap: 8 (ref 5–15)
BUN: 6 mg/dL (ref 6–20)
CHLORIDE: 102 mmol/L (ref 98–111)
CO2: 28 mmol/L (ref 22–32)
CREATININE: 0.53 mg/dL (ref 0.44–1.00)
Calcium: 7.6 mg/dL — ABNORMAL LOW (ref 8.9–10.3)
GFR calc Af Amer: >60 mL/min (ref >60)
GFR calc non Af Amer: >60 mL/min (ref >60)
Glucose, Bld: 140 mg/dL — ABNORMAL HIGH (ref 70–99)
Potassium: 3.2 mmol/L — ABNORMAL LOW (ref 3.5–5.1)
SODIUM: 138 mmol/L (ref 135–145)

## 2018-04-24 LAB — GLUCOSE, CAPILLARY
GLUCOSE-CAPILLARY: 110 mg/dL — AB (ref 70–99)
GLUCOSE-CAPILLARY: 131 mg/dL — AB (ref 70–99)
Glucose-Capillary: 153 mg/dL — ABNORMAL HIGH (ref 70–99)
Glucose-Capillary: 139 mg/dL — ABNORMAL HIGH (ref 70–99)
Glucose-Capillary: 105 mg/dL — ABNORMAL HIGH (ref 70–99)

## 2018-04-24 MED ORDER — FAMOTIDINE 20 MG PO TABS
20.0000 mg | ORAL_TABLET | Freq: Two times a day (BID) | ORAL | Status: DC
  Administered 2018-04-25 (×2): 20 mg via ORAL
  Filled 2018-04-24 (×2): qty 1

## 2018-04-24 MED ORDER — ACETAMINOPHEN 325 MG PO TABS: 650.0000 mg | ORAL_TABLET | Freq: Four times a day (QID) | ORAL | Status: DC | PRN

## 2018-04-24 MED ORDER — DEXAMETHASONE 4 MG PO TABS
4.0000 mg | ORAL_TABLET | Freq: Two times a day (BID) | ORAL | Status: DC
  Administered 2018-04-24 – 2018-04-25 (×2): 4 mg via ORAL
  Filled 2018-04-24 (×2): qty 1

## 2018-04-24 MED ORDER — POTASSIUM CHLORIDE CRYS ER 20 MEQ PO TBCR
40.0000 meq | EXTENDED_RELEASE_TABLET | Freq: Once | ORAL | Status: AC
  Administered 2018-04-24: 40 meq via ORAL
  Filled 2018-04-24: qty 2

## 2018-04-24 MED ORDER — POTASSIUM CHLORIDE 20 MEQ/15ML (10%) PO SOLN
40.0000 meq | Freq: Once | ORAL | Status: DC
  Filled 2018-04-24: qty 30

## 2018-04-24 NOTE — Telephone Encounter (Signed)
Done, thanks

## 2018-04-24 NOTE — Progress Notes (Signed)
Visit made to new referral for [facility] services at home. Patient is  57 year old woman with stage IV peritoneal carcinomatosis diagnoses in 2017. She has had continued disease progression despite continued treatment.  She has also required recurrent therapeutic paracentesis for malignant ascites. She was admitted to [facility] on 10/17 with intractable nausea/vomiting and neutropenic fever. Patient has required IV octreotide, famotidine and decadron for symptom management. Palliative medicine was consulted for symptom management and goals of care. Palliative NP Merrily Pew Borders has been following patient and have met with she and her husband, they have chosen to return home with the support of hospice services.  Writer met in the room with Valerie Horton and her husband Valerie Horton to initiate education regarding hospice services, philosophy and team approach to care with good understanding voiced. No DME needed at discharge. Hospice information and contact number given to [facility]. She does hope to discharge home today, will dc by car. Signed DNR in place in [patient's chart. Will continue to follow through discharge. Thank you for the opportunity to be involved in the care of this patient. Flo Shanks RN, BSN, [place] and [facility] [place], hospital Liaison [phone]

## 2018-04-24 NOTE — Progress Notes (Signed)
Patient ID: Valerie Horton, female   DOB: 05-13-1961, 57 y.o.   MRN: [id number]  [facility] PROGRESS NOTE  Valerie Horton DZH:[id number] DOB: April 22, 1961 DOA: 04/19/2018 PCP: Valerie Roys, DO  HPI/Subjective: Patient states that she is feeling better nausea and vomiting improved   Objective: Vitals:   04/24/18 0355 04/24/18 1335  BP: (!) 102/55 95/61  Pulse: 71 65  Resp: 14   Temp: 97.6 F (36.4 C) 98 F (36.7 C)  SpO2: 98% 100%    There were no vitals filed for this visit.  ROS: Review of Systems  Constitutional: Negative for chills and fever.  Eyes: Negative for blurred vision.  Respiratory: Negative for cough and shortness of breath.   Cardiovascular: Negative for chest pain.  Gastrointestinal: Positive for nausea. Negative for abdominal pain, constipation, diarrhea and vomiting.  Genitourinary: Negative for dysuria.  Musculoskeletal: Negative for joint pain.  Neurological: Negative for dizziness and headaches.   Exam: Physical Exam  HENT:  Nose: No mucosal edema.  Mouth/Throat: No oropharyngeal exudate or posterior oropharyngeal edema.  Eyes: Pupils are equal, round, and reactive to light. Conjunctivae, EOM and lids are normal.  Neck: No JVD present. Carotid bruit is not present. No edema present. No thyroid mass and no thyromegaly present.  Cardiovascular: S1 normal and S2 normal. Exam reveals no gallop.  No murmur heard. Pulses:      Dorsalis pedis pulses are 2+ on the right side, and 2+ on the left side.  Respiratory: No respiratory distress. She has decreased breath sounds in the right lower field and the left lower field. She has no wheezes. She has no rhonchi. She has no rales.  GI: Soft. She exhibits no distension. Bowel sounds are decreased. There is no tenderness.  Musculoskeletal:       Right ankle: She exhibits swelling.       Left ankle: She exhibits swelling.  Lymphadenopathy:    She has no cervical adenopathy.  Neurological: She is  alert. No cranial nerve deficit.  Skin: Skin is warm. No rash noted. Nails show no clubbing.  Psychiatric: She has a normal mood and affect.      Data Reviewed: Basic Metabolic Panel: Recent Labs  Lab 04/19/18 1340 04/22/18 0320 04/23/18 0616 04/24/18 0446  NA 135 134* 134* 138  K 3.7 3.3* 3.4* 3.2*  CL 98 98 101 102  CO2 25 29 29 28   GLUCOSE 166* 138* 185* 140*  BUN 14 6 7 6   CREATININE 0.67 0.44 0.38* 0.53  CALCIUM 8.8* 7.7* 7.4* 7.6*  MG 2.0  --   --   --    Liver Function Tests: Recent Labs  Lab 04/19/18 1340  AST 19  ALT 18  ALKPHOS 93  BILITOT 1.3*  PROT 6.6  ALBUMIN 3.4*   CBC: Recent Labs  Lab 04/19/18 1340 04/20/18 0552 04/22/18 0320 04/23/18 0616  WBC 1.1* 1.1* 1.0* 1.0*  NEUTROABS 0.7*  --   --   --   HGB 12.2 10.0* 9.2* 9.0*  HCT 38.8 31.3* 28.9* 27.9*  MCV 81.7 82.6 80.7 80.6  PLT 126* 116* 71* 74*    Recent Results (from the past 240 hour(s))  CULTURE, BLOOD (ROUTINE X 2) w Reflex to ID Panel     Status: None   Collection Time: 04/19/18  7:00 PM  Result Value Ref Range Status   Specimen Description BLOOD LEFT ANTECUBITAL  Final   Special Requests   Final    BOTTLES DRAWN AEROBIC AND ANAEROBIC Blood  Culture results may not be optimal due to an excessive volume of blood received in culture bottles   Culture   Final    NO GROWTH 5 DAYS Performed at [facility], [address] [place], [place] [address]    Report Status 04/24/2018 FINAL  Final  CULTURE, BLOOD (ROUTINE X 2) w Reflex to ID Panel     Status: None   Collection Time: 04/19/18  7:07 PM  Result Value Ref Range Status   Specimen Description BLOOD BLOOD RIGHT ARM  Final   Special Requests   Final    BOTTLES DRAWN AEROBIC AND ANAEROBIC Blood Culture results may not be optimal due to an excessive volume of blood received in culture bottles   Culture   Final    NO GROWTH 5 DAYS Performed at [facility], [address] [place], [place] [address]    Report  Status 04/24/2018 FINAL  Final     Studies: US Paracentesis  Result Date: 04/23/2018 INDICATION: History of metastatic colon cancer with peritoneal carcinomatosis with recurrent symptomatic intra-abdominal ascites. Please perform ultrasound-guided paracentesis for therapeutic purposes. EXAM: ULTRASOUND-GUIDED PARACENTESIS COMPARISON:  Ultrasound-guided paracentesis - 03/27/2018 (yielding 2.9 L of peritoneal fluid); CT abdomen and pelvis-04/21/2018 MEDICATIONS: None. COMPLICATIONS: None immediate. TECHNIQUE: Informed written consent was obtained from the patient after a discussion of the risks, benefits and alternatives to treatment. A timeout was performed prior to the initiation of the procedure. Initial ultrasound scanning demonstrates a small amount of ascites within the right lower abdominal quadrant. The right lower abdomen was prepped and draped in the usual sterile fashion. 1% lidocaine with epinephrine was used for local anesthesia. An ultrasound image was saved for documentation purposed. An 8 Fr Safe-T-Centesis catheter was introduced. The paracentesis was performed. The catheter was removed and a dressing was applied. The patient tolerated the procedure well without immediate post procedural complication. FINDINGS: A total of approximately 1 liters of serous fluid was removed. IMPRESSION: Successful ultrasound-guided paracentesis yielding 1 liter of peritoneal fluid. Electronically Signed   By: Sandi Mariscal M.D.   On: 04/23/2018 14:57    Scheduled Meds: . acyclovir  800 mg Oral QPM  . dexamethasone  4 mg Oral Q12H  . escitalopram  10 mg Oral QPM  . famotidine  20 mg Oral BID  . fentaNYL  50 mcg Transdermal Q72H  . meloxicam  15 mg Oral Daily  . octreotide  100 mcg Intravenous Daily  . ramelteon  8 mg Oral QHS  . simethicone  80 mg Oral QID   Continuous Infusions:   Assessment/Plan:  1. Peritoneal carcinomatosis with partial small bowel obstruction now resolved will discontinue IV  medications we will try to advance her diet to see if she tolerates then we can discharge her home 2. Abdominal pain.  Continue fentanyl patch changed to oral.  Patient's cancer has progressed prognosis very poor 3. Neutropenic fever no further fever no source of infection identified discontinue IV antibiotic, that is post Neupogen 4. Anxiety depression on Lexapro  Code Status:     Code Status Orders  (From admission, onward)         Start     Ordered   04/19/18 1811  Do not attempt resuscitation (DNR)  Continuous    Question Answer Comment  In the event of cardiac or respiratory ARREST Do not call a "code blue"   In the event of cardiac or respiratory ARREST Do not perform Intubation, CPR, defibrillation or ACLS   In the  event of cardiac or respiratory ARREST Use medication by any route, position, wound care, and other measures to relive pain and suffering. May use oxygen, suction and manual treatment of airway obstruction as needed for comfort.   Comments Nurse may pronounce      04/19/18 1810        Code Status History    This patient has a current code status but no historical code status.    Advance Directive Documentation     Most Recent Value  Type of Advance Directive  Healthcare Power of Attorney, Living will  Pre-existing out of facility DNR order (yellow form or pink MOST form)  -  "MOST" Form in Place?  -     Family Communication: Palliative care to speak with family Disposition Plan: Would be a candidate for hospice either at home or facility.  Consultants:  Oncology  Palliative care  Antibiotics:  Cefepime  Vancomycin  Time spent: 28 minutes  [place]

## 2018-04-25 LAB — BASIC METABOLIC PANEL
Anion gap: 7 (ref 5–15)
BUN: 9 mg/dL (ref 6–20)
CO2: 28 mmol/L (ref 22–32)
Calcium: 7.8 mg/dL — ABNORMAL LOW (ref 8.9–10.3)
Chloride: 101 mmol/L (ref 98–111)
Creatinine, Ser: 0.39 mg/dL — ABNORMAL LOW (ref 0.44–1.00)
GFR calc Af Amer: >60 mL/min (ref >60)
GLUCOSE: 142 mg/dL — AB (ref 70–99)
POTASSIUM: 3.2 mmol/L — AB (ref 3.5–5.1)
Sodium: 136 mmol/L (ref 135–145)

## 2018-04-25 MED ORDER — RAMELTEON 8 MG PO TABS: 8.0000 mg | ORAL_TABLET | Freq: Every day | ORAL | 0 refills | Status: DC

## 2018-04-25 MED ORDER — DEXAMETHASONE 4 MG PO TABS: 4.0000 mg | ORAL_TABLET | Freq: Two times a day (BID) | ORAL | 0 refills | Status: AC

## 2018-04-25 MED ORDER — SIMETHICONE 80 MG PO CHEW: 80.0000 mg | CHEWABLE_TABLET | Freq: Four times a day (QID) | ORAL | 0 refills | Status: AC

## 2018-04-25 MED ORDER — MELOXICAM 15 MG PO TABS: ORAL_TABLET | ORAL | Status: AC

## 2018-04-25 MED ORDER — HEPARIN SOD (PORK) LOCK FLUSH 100 UNIT/ML IV SOLN
500.0000 [IU] | Freq: Once | INTRAVENOUS | Status: AC
  Administered 2018-04-25: 500 [IU] via INTRAVENOUS
  Filled 2018-04-25: qty 5

## 2018-04-25 NOTE — Progress Notes (Signed)
Follow up visit made to new referral for [facility] services at home. Patient seen sitting up ion bed, alert. Plan is for discharge home via car today. Signed DNR in place to accompany patient at discharge. No DME needs. Updated notes faxed to referral.  Flo Shanks RN, BSN, [facility] [place], hospital liaison [phone]

## 2018-04-25 NOTE — Progress Notes (Signed)
[facility]  [phone] [phone]   Name: Valerie Horton Date: 04/25/2018 MRN: [id number]  DOB: 1961/01/03  Patient Care Team: Valerie Roys, DO as PCP - General (Family Medicine) Clent Jacks, RN as Registered Nurse Garnetta Buddy, MD as Consulting Physician (Internal Medicine)    REASON FOR CONSULTATION: Palliative Care consult requested for this57 y.o.femalefor goals of medical treatment in patient with multiple medical problems including stage IV peritoneal carcinomatosis initially diagnosed in July 2017. She is status post 12 cycles of FOLFOX (completed January 2018) with subsequent disease progression. She was treated with 18 cycles of FOLFIRI beginning January 2018 and ending October 2019. She restarted FOLFIRI July 2019 but had poor tolerance. She is now being treated with irinotecan and Panitumumab. Unfortunately patient has had evidence of progressive disease with malignant ascites requiring recurrent therapeutic paracentesis, poor oral intake, and subsequent weight loss. Patient was admitted to the hospital on 04/19/18 due to intractable nausea and vomiting and neutropenic fever.  Palliative care has been asked to help address symptom management and clarify goals of care.  CODE STATUS: DNR  PAST MEDICAL HISTORY: Past Medical History:  Diagnosis Date  . Acid reflux   . Allergic rhinitis   . Anxiety   . Arthritis    left knee  . Cervical spondylosis   . CTS (carpal tunnel syndrome)    Right  . Diverticulosis   . HSV-2 (herpes simplex virus 2) infection   . Hyperlipidemia   . Ovarian failure   . Peritoneal carcinomatosis ([place]) 01/2016   chemo tx's.   . Rosacea   . Wears contact lenses     PAST SURGICAL HISTORY:  Past Surgical History:  Procedure Laterality Date  . ANTERIOR CRUCIATE LIGAMENT REPAIR Left   . CESAREAN SECTION    . COLONOSCOPY WITH PROPOFOL N/A 01/29/2016   Procedure: COLONOSCOPY WITH PROPOFOL;  Surgeon: Lucilla Lame, MD;  Location: [facility];  Service: Endoscopy;  Laterality: N/A;  . COLONOSCOPY WITH PROPOFOL N/A 01/01/2018   Procedure: COLONOSCOPY WITH PROPOFOL;  Surgeon: Lucilla Lame, MD;  Location: [facility];  Service: Endoscopy;  Laterality: N/A;  . ESOPHAGOGASTRODUODENOSCOPY (EGD) WITH PROPOFOL N/A 01/29/2016   Procedure: ESOPHAGOGASTRODUODENOSCOPY (EGD) WITH PROPOFOL;  Surgeon: Lucilla Lame, MD;  Location: [facility];  Service: Endoscopy;  Laterality: N/A;  . ESOPHAGOGASTRODUODENOSCOPY (EGD) WITH PROPOFOL N/A 01/01/2018   Procedure: ESOPHAGOGASTRODUODENOSCOPY (EGD) WITH PROPOFOL;  Surgeon: Lucilla Lame, MD;  Location: [facility];  Service: Endoscopy;  Laterality: N/A;  . PERIPHERAL VASCULAR CATHETERIZATION N/A 02/10/2016   Procedure: Glori Luis Cath Insertion;  Surgeon: Algernon Huxley, MD;  Location: [facility];  Service: Cardiovascular;  Laterality: N/A;    HEMATOLOGY/ONCOLOGY HISTORY:  Oncology History   Patient with initial diagnosis in July 2017 where she presented to the emergency room for abdominal pain, bloating and fullness.  She was found to have ascites and a paracentesis was performed.  Ca 125 was elevated.  Omental biopsy was performed along with testing of the fluid from recent paracentesis.  Pathology revealed lower GI primary.  She was started on FOLFOX for 12 cycles.  Compleated 11 cycles on 07/21/2016.  Progression of disease was noted and therapy was switched. Began FOLFIRI in January 2018.  Received 18 cycles completing in October 2018.  She required ultrasound-guided paracentesis more frequient.  She was not a surgical candidate. Foundation one testing did not reveal any actionable mutations.  Restarted FOLFIRI chemotherapy in July  2019 with poor tolerance.      Peritoneal carcinomatosis ([place])   01/20/2016 Initial Diagnosis    Peritoneal carcinomatosis ([place])    03/30/2018 - 04/29/2018  Chemotherapy    The patient had palonosetron (ALOXI) injection 0.25 mg, 0.25 mg, Intravenous,  Once, 2 of 6 cycles Administration: 0.25 mg (04/02/2018), 0.25 mg (04/16/2018) irinotecan (CAMPTOSAR) 300 mg in dextrose 5 % 500 mL chemo infusion, 180 mg/m2 = 300 mg, Intravenous,  Once, 2 of 6 cycles Administration: 300 mg (04/02/2018), 300 mg (04/16/2018) panitumumab (VECTIBIX) 400 mg in sodium chloride 0.9 % 100 mL chemo infusion, 6 mg/kg = 400 mg, Intravenous,  Once, 2 of 6 cycles Administration: 400 mg (04/02/2018), 400 mg (04/16/2018)  for chemotherapy treatment.      ALLERGIES:  has No Known Allergies.  MEDICATIONS:  Current Facility-Administered Medications  Medication Dose Route Frequency Provider Last Rate Last Dose  . acetaminophen (TYLENOL) tablet 650 mg  650 mg Oral Q6H PRN Dustin Flock, MD      . acyclovir (ZOVIRAX) 200 MG capsule 800 mg  800 mg Oral QPM Salary, Montell D, MD   800 mg at 04/19/18 2033  . dexamethasone (DECADRON) tablet 4 mg  4 mg Oral Q12H Dustin Flock, MD   4 mg at 04/25/18 0933  . diphenhydrAMINE (BENADRYL) capsule 25 mg  25 mg Oral Q6H PRN Salary, Montell D, MD      . escitalopram (LEXAPRO) tablet 10 mg  10 mg Oral QPM Salary, Montell D, MD   10 mg at 04/24/18 1742  . famotidine (PEPCID) tablet 20 mg  20 mg Oral BID Dustin Flock, MD   20 mg at 04/25/18 0935  . fentaNYL (DURAGESIC - dosed mcg/hr) patch 50 mcg  50 mcg Transdermal Q72H Salary, Montell D, MD   50 mcg at 04/22/18 1821  . HYDROcodone-acetaminophen (NORCO/VICODIN) 5-325 MG per tablet 1-2 tablet  1-2 tablet Oral Q4H PRN Salary, Avel Peace, MD   2 tablet at 04/24/18 1847  . loperamide (IMODIUM) capsule 4 mg  4 mg Oral PRN Salary, Avel Peace, MD   4 mg at 04/25/18 0442  . meloxicam (MOBIC) tablet 15 mg  15 mg Oral Daily Salary, Montell D, MD      . octreotide (SANDOSTATIN) injection 100 mcg  100 mcg Intravenous Daily Philicia Heyne, Kirt Boys, NP   100 mcg at 04/24/18 1811  . oxyCODONE (Oxy IR/ROXICODONE)  immediate release tablet 10 mg  10 mg Oral Q4H PRN Salary, Holly Bodily D, MD   10 mg at 04/20/18 0826  . prochlorperazine (COMPAZINE) injection 10 mg  10 mg Intravenous Q4H PRN Natiya Seelinger, Kirt Boys, NP   10 mg at 04/20/18 0826  . promethazine (PHENERGAN) tablet 12.5 mg  12.5 mg Oral Q6H PRN Salary, Montell D, MD   12.5 mg at 04/20/18 2257  . ramelteon (ROZEREM) tablet 8 mg  8 mg Oral Corwin Levins, MD   8 mg at 04/25/18 0011  . simethicone (MYLICON) chewable tablet 80 mg  80 mg Oral QID Dustin Flock, MD   80 mg at 04/25/18 6962   Facility-Administered Medications Ordered in Other Encounters  Medication Dose Route Frequency Provider Last Rate Last Dose  . 0.9 %  sodium chloride infusion   Intravenous Once Lloyd Huger, MD      . heparin lock flush 100 unit/mL  500 Units Intravenous Once Lloyd Huger, MD      . heparin lock flush 100 unit/mL  500 Units Intravenous Once Lloyd Huger, MD      .  sodium chloride flush (NS) 0.9 % injection 10 mL  10 mL Intravenous PRN Lloyd Huger, MD   10 mL at 08/16/16 0925    VITAL SIGNS: BP (!) 98/49 (BP Location: Left Arm)   Pulse 86   Temp 98.1 F (36.7 C) (Oral)   Resp 13   SpO2 100%  There were no vitals filed for this visit.  Estimated body mass index is 24.8 kg/m as calculated from the following:   Height as of 04/09/18: 5\' 3"  (1.6 m).   Weight as of 04/16/18: 140 lb (63.5 kg).  LABS: CBC:    Component Value Date/Time   WBC 1.0 (LL) 04/23/2018 0616   HGB 9.0 (L) 04/23/2018 0616   HGB 12.0 09/28/2017 1417   HCT 27.9 (L) 04/23/2018 0616   HCT 35.3 09/28/2017 1417   PLT 74 (L) 04/23/2018 0616   PLT 309 09/28/2017 1417   MCV 80.6 04/23/2018 0616   MCV 84 09/28/2017 1417   NEUTROABS 0.7 (L) 04/19/2018 1340   NEUTROABS 3.9 09/28/2017 1417   LYMPHSABS 0.3 (L) 04/19/2018 1340   LYMPHSABS 1.7 09/28/2017 1417   MONOABS 0.1 04/19/2018 1340   EOSABS 0.0 04/19/2018 1340   EOSABS 0.0 09/28/2017 1417   BASOSABS 0.0  04/19/2018 1340   BASOSABS 0.0 09/28/2017 1417   Comprehensive Metabolic Panel:    Component Value Date/Time   NA 136 04/25/2018 0954   NA 139 09/28/2017 1417   K 3.2 (L) 04/25/2018 0954   CL 101 04/25/2018 0954   CO2 28 04/25/2018 0954   BUN 9 04/25/2018 0954   BUN 17 09/28/2017 1417   CREATININE 0.39 (L) 04/25/2018 0954   GLUCOSE 142 (H) 04/25/2018 0954   CALCIUM 7.8 (L) 04/25/2018 0954   AST 19 04/19/2018 1340   ALT 18 04/19/2018 1340   ALKPHOS 93 04/19/2018 1340   BILITOT 1.3 (H) 04/19/2018 1340   BILITOT <0.2 09/28/2017 1417   PROT 6.6 04/19/2018 1340   PROT 6.7 09/28/2017 1417   ALBUMIN 3.4 (L) 04/19/2018 1340   ALBUMIN 3.8 09/28/2017 1417    RADIOGRAPHIC STUDIES: Dg Abd 1 View  Result Date: 04/21/2018 CLINICAL DATA:  NG tube placement EXAM: ABDOMEN - 1 VIEW COMPARISON:  04/20/2017 FINDINGS: NG tube tip is in the mid to distal stomach. Mild gaseous distention of central small bowel loops, stable. IMPRESSION: NG tube tip in the mid to distal stomach. Electronically Signed   By: Rolm Baptise M.D.   On: 04/21/2018 20:18   Ct Abdomen Pelvis W Contrast  Result Date: 04/21/2018 CLINICAL DATA:  Metastatic colon cancer, ascites, peritoneal carcinomatosis, obstruction symptoms EXAM: CT ABDOMEN AND PELVIS WITH CONTRAST TECHNIQUE: Multidetector CT imaging of the abdomen and pelvis was performed using the standard protocol following bolus administration of intravenous contrast. CONTRAST:  176mL ISOVUE-300 IOPAMIDOL (ISOVUE-300) INJECTION 61% COMPARISON:  01/27/2018 FINDINGS: Lower chest: Inferior right middle lobe and bibasilar atelectasis. Trace right pleural effusion. Normal heart size. No pericardial effusion. No hiatal hernia. Hepatobiliary: No focal hepatic abnormality. Hepatic portal veins are patent. No biliary obstruction. Moderate gallbladder distention. Common bile duct nondilated. Pancreas: Unremarkable. No pancreatic ductal dilatation or surrounding inflammatory changes.  Spleen: Normal in size without focal abnormality. Adrenals/Urinary Tract: Adrenal glands are unremarkable. Kidneys are normal, without renal calculi, focal lesion, or hydronephrosis. Bladder is unremarkable. Stomach/Bowel: Increased distention of several mid and distal small bowel loops with air-fluid levels since the prior study compatible with developing small bowel obstruction. No free air evident. Moderate volume of abdominopelvic ascites. Progression  of diffuse omental and peritoneal carcinomatosis with increased infiltration and nodularity in the left abdomen and central mesentery. No developing focal abscess, hemorrhage or hematoma. Distal rectal wall thickening as before consistent with the known rectal lesion. Known cecal lesion is not well appreciated by CT. Vascular/Lymphatic: Minor aortoiliac atherosclerosis. No occlusive process, aneurysm, dissection, or acute vascular finding. Mesenteric renal vasculature all remain patent. No veno-occlusive process. No adenopathy. Reproductive: Uterus and adnexa normal in size. Pelvic ascites noted. Other: No inguinal or abdominal wall hernia. Musculoskeletal: No acute osseous finding. No abnormal osseous lesion. IMPRESSION: Slight progression of peritoneal carcinomatosis in the left abdomen. Moderate abdominopelvic ascites. Increased mid and distal small bowel fluid distension with air-fluid levels palpable with developing obstruction pattern. Known cecal mass not well demonstrated on today's exam. Rectal wall thickening noted compatible with a known rectal lesion previously biopsied. No free air evident or perforation. Negative for focal fluid collection or abscess. Electronically Signed   By: Jerilynn Mages.  Shick M.D.   On: 04/21/2018 15:04   US Paracentesis  Result Date: 04/23/2018 INDICATION: History of metastatic colon cancer with peritoneal carcinomatosis with recurrent symptomatic intra-abdominal ascites. Please perform ultrasound-guided paracentesis for therapeutic  purposes. EXAM: ULTRASOUND-GUIDED PARACENTESIS COMPARISON:  Ultrasound-guided paracentesis - 03/27/2018 (yielding 2.9 L of peritoneal fluid); CT abdomen and pelvis-04/21/2018 MEDICATIONS: None. COMPLICATIONS: None immediate. TECHNIQUE: Informed written consent was obtained from the patient after a discussion of the risks, benefits and alternatives to treatment. A timeout was performed prior to the initiation of the procedure. Initial ultrasound scanning demonstrates a small amount of ascites within the right lower abdominal quadrant. The right lower abdomen was prepped and draped in the usual sterile fashion. 1% lidocaine with epinephrine was used for local anesthesia. An ultrasound image was saved for documentation purposed. An 8 Fr Safe-T-Centesis catheter was introduced. The paracentesis was performed. The catheter was removed and a dressing was applied. The patient tolerated the procedure well without immediate post procedural complication. FINDINGS: A total of approximately 1 liters of serous fluid was removed. IMPRESSION: Successful ultrasound-guided paracentesis yielding 1 liter of peritoneal fluid. Electronically Signed   By: Sandi Mariscal M.D.   On: 04/23/2018 14:57   US Paracentesis  Result Date: 03/27/2018 INDICATION: Patient with history of peritoneal carcinomatosis, abdominal distention, recurrent ascites. Request is made for therapeutic paracentesis. EXAM: ULTRASOUND GUIDED tHERAPEUTIC PARACENTESIS MEDICATIONS: 10 mL 1% lidocaine COMPLICATIONS: None immediate. PROCEDURE: Informed written consent was obtained from the patient after a discussion of the risks, benefits and alternatives to treatment. A timeout was performed prior to the initiation of the procedure. Initial ultrasound scanning demonstrates a moderate amount of ascites within the left lateral abdomen. The left lateral abdomen was prepped and draped in the usual sterile fashion. 1% lidocaine was used for local anesthesia. Following this, a  19 gauge, 7-cm, Yueh catheter was introduced. An ultrasound image was saved for documentation purposes. The paracentesis was performed. The catheter was removed and a dressing was applied. The patient tolerated the procedure well without immediate post procedural complication. FINDINGS: A total of approximately 2.9 liters of dark, amber fluid was removed. IMPRESSION: Successful ultrasound-guided therapeutic paracentesis yielding 2.9 liters of peritoneal fluid. Read by: Brynda Greathouse PA-C Electronically Signed   By: Marybelle Killings M.D.   On: 03/27/2018 09:19   Dg Abd 2 Views  Result Date: 04/20/2018 CLINICAL DATA:  Stage IV peritoneal carcinomatosis diagnosed July 2017. Intractable nausea and vomiting with neutropenic fever. No bowel movement 4 days. EXAM: ABDOMEN - 2 VIEW COMPARISON:  04/12/2018 FINDINGS:  Bowel gas pattern demonstrates a few air-filled dilated small bowel loops worse compared to the previous exam measuring up to 4.8 cm in diameter. There are a few scattered air-fluid levels. No evidence of free peritoneal air. Paucity of colonic air is present. No air seen distally over the rectum. Remainder of the exam is unchanged. IMPRESSION: Progression of air-filled dilated small bowel loops with less colonic air compatible with some degree of small bowel obstruction. Electronically Signed   By: Marin Olp M.D.   On: 04/20/2018 13:29   Dg Abd 2 Views  Result Date: 04/12/2018 CLINICAL DATA:  Abdominal pain with vomiting and diarrhea. History of peritoneal carcinomatosis. EXAM: ABDOMEN - 2 VIEW COMPARISON:  CT abdomen pelvis dated January 27, 2018. FINDINGS: Focal loop of dilated, air-filled small bowel in the left abdomen. No air-fluid levels. Air and stool are seen within the colon. There is no evidence of free air. No radio-opaque calculi or other significant radiographic abnormality is seen. IMPRESSION: 1. Mildly dilated loop of small bowel in the left abdomen, nonspecific. This could reflect  enteritis or partial obstruction. Electronically Signed   By: Titus Dubin M.D.   On: 04/12/2018 16:51    PERFORMANCE STATUS (ECOG) : 3 - Symptomatic, >50% confined to bed  Review of Systems As noted above. Otherwise, a complete review of systems is negative.  Physical Exam full exam deferred General: thin, frail appearing, sitting in chair Lungs: unlabored, CTA ant fields Heart: RRR ABD: slightly distended, hypoactive BS Musculoskeletal: No edema Neuro: Alert, answering all questions appropriately. Cranial nerves grossly intact.  IMPRESSION: Patient is significantly improved from when she was admitted to the hospital.  She is tolerating oral intake without recent nausea or vomiting.  Unfortunately, patient has had some diarrhea over the last 24 hours.  Etiology of diarrhea likely secondary to medication effect.  Patient had received Dulcolax suppository and metoclopramide, both of which have now been stopped.  Patient is anxious to discharge home.  Hospice care has been arranged in the home.  Case discussed with attending.  Patient is at risk of recurrent obstruction, which may cause future intractable nausea and vomiting.  She might need to resume the metoclopramide.  Would recommend continuing dexamethasone.  Unfortunately, octreotide will not be considered as part of her hospice formulary.  She also has Compazine and ondansetron in the home, both of which can be taken scheduled if needed.  For diarrhea, patient has taken Imodium as needed.  Patient requests a medication at time of discharge for management of insomnia.  She was given ramelton with good effect.   PLAN: 1.  Plan for discharge home with hospice care when medically ready   Time Total: 15 minutes  Visit consisted of counseling and education dealing with the complex and emotionally intense issues of symptom management and palliative care in the setting of serious and potentially life-threatening illness.Greater than 50%   of this time was spent counseling and coordinating care related to the above assessment and plan.  Signed by: Altha Harm, [place], NP-C, [place] (Work Cell)

## 2018-04-25 NOTE — Progress Notes (Signed)
Pt c/o diarrhea on shift, Imodium administered 3 times, MD willis /Diamond made aware. No new orders obtained.   Pt refuses labs for the moment.

## 2018-04-25 NOTE — Discharge Summary (Signed)
[place] at [facility], 57 y.o., DOB 11/23/1960, MRN [id number]. Admission date: 04/19/2018 Discharge Date 04/25/2018 Primary MD Valerie Roys, DO Admitting Physician Fritzi Mandes, MD  Admission Diagnosis  neutropenica fever nv pain  Discharge Diagnosis   Active Problems:   Peritoneal carcinomatosis [facility]) with partial small bowel obstruction Abdominal pain due to progression of peritoneal carcinomatosis   Nausea & vomiting   Abdominal distension   Neutropenic fever Anxiety/depression Ascites status post drainage       Hospital Course Patient is a 57 year old with history of peritoneal carcinomatosis who presented with abdominal pain and bowel distention.  Patient also had fever on admission.  She was treated with supportive care.  Patient had a CT scan which showed ascites which was drained.  Patient was seen by oncology and referred her to home hospice services.  Prognosis is very poor. She is feeling little better.           Consults  hematology/oncology  Significant Tests:  See full reports for all details     Dg Abd 1 View  Result Date: 04/21/2018 CLINICAL DATA:  NG tube placement EXAM: ABDOMEN - 1 VIEW COMPARISON:  04/20/2017 FINDINGS: NG tube tip is in the mid to distal stomach. Mild gaseous distention of central small bowel loops, stable. IMPRESSION: NG tube tip in the mid to distal stomach. Electronically Signed   By: Rolm Baptise M.D.   On: 04/21/2018 20:18   Ct Abdomen Pelvis W Contrast  Result Date: 04/21/2018 CLINICAL DATA:  Metastatic colon cancer, ascites, peritoneal carcinomatosis, obstruction symptoms EXAM: CT ABDOMEN AND PELVIS WITH CONTRAST TECHNIQUE: Multidetector CT imaging of the abdomen and pelvis was performed using the standard protocol following bolus administration of intravenous contrast. CONTRAST:  138mL ISOVUE-300 IOPAMIDOL (ISOVUE-300) INJECTION 61% COMPARISON:  01/27/2018 FINDINGS: Lower  chest: Inferior right middle lobe and bibasilar atelectasis. Trace right pleural effusion. Normal heart size. No pericardial effusion. No hiatal hernia. Hepatobiliary: No focal hepatic abnormality. Hepatic portal veins are patent. No biliary obstruction. Moderate gallbladder distention. Common bile duct nondilated. Pancreas: Unremarkable. No pancreatic ductal dilatation or surrounding inflammatory changes. Spleen: Normal in size without focal abnormality. Adrenals/Urinary Tract: Adrenal glands are unremarkable. Kidneys are normal, without renal calculi, focal lesion, or hydronephrosis. Bladder is unremarkable. Stomach/Bowel: Increased distention of several mid and distal small bowel loops with air-fluid levels since the prior study compatible with developing small bowel obstruction. No free air evident. Moderate volume of abdominopelvic ascites. Progression of diffuse omental and peritoneal carcinomatosis with increased infiltration and nodularity in the left abdomen and central mesentery. No developing focal abscess, hemorrhage or hematoma. Distal rectal wall thickening as before consistent with the known rectal lesion. Known cecal lesion is not well appreciated by CT. Vascular/Lymphatic: Minor aortoiliac atherosclerosis. No occlusive process, aneurysm, dissection, or acute vascular finding. Mesenteric renal vasculature all remain patent. No veno-occlusive process. No adenopathy. Reproductive: Uterus and adnexa normal in size. Pelvic ascites noted. Other: No inguinal or abdominal wall hernia. Musculoskeletal: No acute osseous finding. No abnormal osseous lesion. IMPRESSION: Slight progression of peritoneal carcinomatosis in the left abdomen. Moderate abdominopelvic ascites. Increased mid and distal small bowel fluid distension with air-fluid levels palpable with developing obstruction pattern. Known cecal mass not well demonstrated on today's exam. Rectal wall thickening noted compatible with a known rectal lesion  previously biopsied. No free air evident or perforation. Negative for focal fluid collection or abscess. Electronically Signed   By: Jerilynn Mages.  Shick M.D.   On:  04/21/2018 15:04   US Paracentesis  Result Date: 04/23/2018 INDICATION: History of metastatic colon cancer with peritoneal carcinomatosis with recurrent symptomatic intra-abdominal ascites. Please perform ultrasound-guided paracentesis for therapeutic purposes. EXAM: ULTRASOUND-GUIDED PARACENTESIS COMPARISON:  Ultrasound-guided paracentesis - 03/27/2018 (yielding 2.9 L of peritoneal fluid); CT abdomen and pelvis-04/21/2018 MEDICATIONS: None. COMPLICATIONS: None immediate. TECHNIQUE: Informed written consent was obtained from the patient after a discussion of the risks, benefits and alternatives to treatment. A timeout was performed prior to the initiation of the procedure. Initial ultrasound scanning demonstrates a small amount of ascites within the right lower abdominal quadrant. The right lower abdomen was prepped and draped in the usual sterile fashion. 1% lidocaine with epinephrine was used for local anesthesia. An ultrasound image was saved for documentation purposed. An 8 Fr Safe-T-Centesis catheter was introduced. The paracentesis was performed. The catheter was removed and a dressing was applied. The patient tolerated the procedure well without immediate post procedural complication. FINDINGS: A total of approximately 1 liters of serous fluid was removed. IMPRESSION: Successful ultrasound-guided paracentesis yielding 1 liter of peritoneal fluid. Electronically Signed   By: Sandi Mariscal M.D.   On: 04/23/2018 14:57   US Paracentesis  Result Date: 03/27/2018 INDICATION: Patient with history of peritoneal carcinomatosis, abdominal distention, recurrent ascites. Request is made for therapeutic paracentesis. EXAM: ULTRASOUND GUIDED tHERAPEUTIC PARACENTESIS MEDICATIONS: 10 mL 1% lidocaine COMPLICATIONS: None immediate. PROCEDURE: Informed written consent  was obtained from the patient after a discussion of the risks, benefits and alternatives to treatment. A timeout was performed prior to the initiation of the procedure. Initial ultrasound scanning demonstrates a moderate amount of ascites within the left lateral abdomen. The left lateral abdomen was prepped and draped in the usual sterile fashion. 1% lidocaine was used for local anesthesia. Following this, a 19 gauge, 7-cm, Yueh catheter was introduced. An ultrasound image was saved for documentation purposes. The paracentesis was performed. The catheter was removed and a dressing was applied. The patient tolerated the procedure well without immediate post procedural complication. FINDINGS: A total of approximately 2.9 liters of dark, amber fluid was removed. IMPRESSION: Successful ultrasound-guided therapeutic paracentesis yielding 2.9 liters of peritoneal fluid. Read by: Brynda Greathouse PA-C Electronically Signed   By: Marybelle Killings M.D.   On: 03/27/2018 09:19   Dg Abd 2 Views  Result Date: 04/20/2018 CLINICAL DATA:  Stage IV peritoneal carcinomatosis diagnosed July 2017. Intractable nausea and vomiting with neutropenic fever. No bowel movement 4 days. EXAM: ABDOMEN - 2 VIEW COMPARISON:  04/12/2018 FINDINGS: Bowel gas pattern demonstrates a few air-filled dilated small bowel loops worse compared to the previous exam measuring up to 4.8 cm in diameter. There are a few scattered air-fluid levels. No evidence of free peritoneal air. Paucity of colonic air is present. No air seen distally over the rectum. Remainder of the exam is unchanged. IMPRESSION: Progression of air-filled dilated small bowel loops with less colonic air compatible with some degree of small bowel obstruction. Electronically Signed   By: Marin Olp M.D.   On: 04/20/2018 13:29   Dg Abd 2 Views  Result Date: 04/12/2018 CLINICAL DATA:  Abdominal pain with vomiting and diarrhea. History of peritoneal carcinomatosis. EXAM: ABDOMEN - 2 VIEW  COMPARISON:  CT abdomen pelvis dated January 27, 2018. FINDINGS: Focal loop of dilated, air-filled small bowel in the left abdomen. No air-fluid levels. Air and stool are seen within the colon. There is no evidence of free air. No radio-opaque calculi or other significant radiographic abnormality is seen. IMPRESSION: 1. Mildly dilated  loop of small bowel in the left abdomen, nonspecific. This could reflect enteritis or partial obstruction. Electronically Signed   By: Titus Dubin M.D.   On: 04/12/2018 16:51       Today   Subjective:   Valerie Horton patient's nausea is improved Objective:   Blood pressure (!) 98/49, pulse 86, temperature 98.1 F (36.7 C), temperature source Oral, resp. rate 13, SpO2 100 %.  . No intake or output data in the 24 hours ending 04/25/18 1459  Exam VITAL SIGNS: Blood pressure (!) 98/49, pulse 86, temperature 98.1 F (36.7 C), temperature source Oral, resp. rate 13, SpO2 100 %.  GENERAL:  57 y.o.-year-old patient lying in the bed with no acute distress.  EYES: Pupils equal, round, reactive to light and accommodation. No scleral icterus. Extraocular muscles intact.  HEENT: Head atraumatic, normocephalic. Oropharynx and nasopharynx clear.  NECK:  Supple, no jugular venous distention. No thyroid enlargement, no tenderness.  LUNGS: Normal breath sounds bilaterally, no wheezing, rales,rhonchi or crepitation. No use of accessory muscles of respiration.  CARDIOVASCULAR: S1, S2 normal. No murmurs, rubs, or gallops.  ABDOMEN: Soft, nontender, nondistended. Bowel sounds present. No organomegaly or mass.  EXTREMITIES: No pedal edema, cyanosis, or clubbing.  NEUROLOGIC: Cranial nerves II through XII are intact. Muscle strength 5/5 in all extremities. Sensation intact. Gait not checked.  PSYCHIATRIC: The patient is alert and oriented x 3.  SKIN: No obvious rash, lesion, or ulcer.   Data Review     CBC w Diff:  Lab Results  Component Value Date   WBC 1.0 (LL)  04/23/2018   HGB 9.0 (L) 04/23/2018   HGB 12.0 09/28/2017   HCT 27.9 (L) 04/23/2018   HCT 35.3 09/28/2017   PLT 74 (L) 04/23/2018   PLT 309 09/28/2017   LYMPHOPCT 29 04/19/2018   MONOPCT 9 04/19/2018   EOSPCT 0 04/19/2018   BASOPCT 1 04/19/2018   CMP:  Lab Results  Component Value Date   NA 136 04/25/2018   NA 139 09/28/2017   K 3.2 (L) 04/25/2018   CL 101 04/25/2018   CO2 28 04/25/2018   BUN 9 04/25/2018   BUN 17 09/28/2017   CREATININE 0.39 (L) 04/25/2018   PROT 6.6 04/19/2018   PROT 6.7 09/28/2017   ALBUMIN 3.4 (L) 04/19/2018   ALBUMIN 3.8 09/28/2017   BILITOT 1.3 (H) 04/19/2018   BILITOT <0.2 09/28/2017   ALKPHOS 93 04/19/2018   AST 19 04/19/2018   ALT 18 04/19/2018  .  Micro Results Recent Results (from the past 240 hour(s))  CULTURE, BLOOD (ROUTINE X 2) w Reflex to ID Panel     Status: None   Collection Time: 04/19/18  7:00 PM  Result Value Ref Range Status   Specimen Description BLOOD LEFT ANTECUBITAL  Final   Special Requests   Final    BOTTLES DRAWN AEROBIC AND ANAEROBIC Blood Culture results may not be optimal due to an excessive volume of blood received in culture bottles   Culture   Final    NO GROWTH 5 DAYS Performed at [facility], [address] [place], [place] [address]    Report Status 04/24/2018 FINAL  Final  CULTURE, BLOOD (ROUTINE X 2) w Reflex to ID Panel     Status: None   Collection Time: 04/19/18  7:07 PM  Result Value Ref Range Status   Specimen Description BLOOD BLOOD RIGHT ARM  Final   Special Requests   Final    BOTTLES DRAWN AEROBIC AND ANAEROBIC Blood  Culture results may not be optimal due to an excessive volume of blood received in culture bottles   Culture   Final    NO GROWTH 5 DAYS Performed at [facility], [address] [place], [place] [address]    Report Status 04/24/2018 FINAL  Final        Code Status Orders  (From admission, onward)         Start     Ordered   04/19/18 1811  Do not  attempt resuscitation (DNR)  Continuous    Question Answer Comment  In the event of cardiac or respiratory ARREST Do not call a "code blue"   In the event of cardiac or respiratory ARREST Do not perform Intubation, CPR, defibrillation or ACLS   In the event of cardiac or respiratory ARREST Use medication by any route, position, wound care, and other measures to relive pain and suffering. May use oxygen, suction and manual treatment of airway obstruction as needed for comfort.   Comments Nurse may pronounce      04/19/18 1810        Code Status History    This patient has a current code status but no historical code status.    Advance Directive Documentation     Most Recent Value  Type of Advance Directive  Healthcare Power of Attorney, Living will  Pre-existing out of facility DNR order (yellow form or pink MOST form)  -  "MOST" Form in Place?  -          Follow-up Information    Johnson, Megan P, DO. Go on 05/03/2018.   Specialty:  Family Medicine Why:  @ 2:30pm Contact information: [place] [address] [phone]           Discharge Medications   Allergies as of 04/25/2018   No Known Allergies     Medication List    STOP taking these medications   metoCLOPramide 10 MG tablet Commonly known as:  REGLAN   naproxen 500 MG tablet Commonly known as:  NAPROSYN   oxyCODONE-acetaminophen 5-325 MG tablet Commonly known as:  PERCOCET/ROXICET   polyethylene glycol packet Commonly known as:  MIRALAX / GLYCOLAX     TAKE these medications   acyclovir 800 MG tablet Commonly known as:  ZOVIRAX TAKE 1 TABLET BY MOUTH EVERY DAY What changed:    when to take this  additional instructions   clindamycin 1 % gel Commonly known as:  CLINDAGEL Apply topically 2 (two) times daily.   dexamethasone 4 MG tablet Commonly known as:  DECADRON Take 1 tablet (4 mg total) by mouth every 12 (twelve) hours.   escitalopram 10 MG tablet Commonly known as:   LEXAPRO TAKE 1 TABLET BY MOUTH EVERY DAY What changed:  when to take this   fentaNYL 25 MCG/HR patch Commonly known as:  [place] - dosed mcg/hr Place 1 patch (25 mcg total) onto the skin every 3 (three) days.   lidocaine-prilocaine cream Commonly known as:  EMLA Apply 1 application topically as needed.   loperamide 2 MG capsule Commonly known as:  IMODIUM Take 2 capsules (4 mg total) by mouth as needed for diarrhea or loose stools.   meloxicam 15 MG tablet Commonly known as:  MOBIC Mobic 15 mg tablet  Take 1 tablet every day by oral route with meals.   ondansetron 8 MG tablet Commonly known as:  ZOFRAN Take 1 tablet (8 mg total) by mouth every 8 (eight) hours as needed  for nausea or vomiting.   Oxycodone HCl 10 MG Tabs Take 1 tablet (10 mg total) by mouth every 4 (four) hours as needed.   pantoprazole 40 MG tablet Commonly known as:  PROTONIX TAKE 1 TABLET BY MOUTH EVERY DAY What changed:  when to take this   prochlorperazine 10 MG tablet Commonly known as:  COMPAZINE Take 1 tablet (10 mg total) by mouth every 6 (six) hours as needed for nausea or vomiting.   ramelteon 8 MG tablet Commonly known as:  ROZEREM Take 1 tablet (8 mg total) by mouth at bedtime.   simethicone 80 MG chewable tablet Commonly known as:  MYLICON Chew 1 tablet (80 mg total) by mouth 4 (four) times daily.          Total Time in preparing paper work, data evaluation and todays exam - 45 minutes  Dustin Flock M.D on 04/25/2018 at 2:59 PM [facility]  [phone]

## 2018-04-26 ENCOUNTER — Other Ambulatory Visit: Payer: Self-pay | Admitting: *Deleted

## 2018-04-26 ENCOUNTER — Encounter: Payer: Self-pay | Admitting: Oncology

## 2018-04-26 ENCOUNTER — Encounter: Payer: Self-pay | Admitting: Family Medicine

## 2018-04-26 MED ORDER — OXYCODONE HCL 10 MG PO TABS: 10.0000 mg | ORAL_TABLET | ORAL | 0 refills | Status: DC | PRN

## 2018-04-26 MED ORDER — RAMELTEON 8 MG PO TABS: 8.0000 mg | ORAL_TABLET | Freq: Every day | ORAL | 0 refills | Status: AC

## 2018-04-26 MED ORDER — FENTANYL 50 MCG/HR TD PT72: 50.0000 ug | MEDICATED_PATCH | TRANSDERMAL | 0 refills | Status: DC

## 2018-04-26 NOTE — Telephone Encounter (Signed)
Appointment is cancelled.

## 2018-04-27 ENCOUNTER — Telehealth: Payer: Self-pay | Admitting: Hospice and Palliative Medicine

## 2018-04-27 NOTE — Telephone Encounter (Signed)
I spoke with patient and husband by phone. Patient confirms she was admitted under hospice care. She says she had an episode of vomiting last night but has not had any nausea or vomiting since. Pain is reportedly well controlled. She has continued to have episodes of diarrhea and is taking Imodium as needed. No fevers/chills.

## 2018-04-30 ENCOUNTER — Inpatient Hospital Stay: Payer: BLUE CROSS/BLUE SHIELD | Admitting: Hospice and Palliative Medicine

## 2018-04-30 ENCOUNTER — Inpatient Hospital Stay: Payer: BLUE CROSS/BLUE SHIELD

## 2018-04-30 ENCOUNTER — Inpatient Hospital Stay: Payer: BLUE CROSS/BLUE SHIELD | Admitting: Oncology

## 2018-05-03 ENCOUNTER — Inpatient Hospital Stay: Payer: BLUE CROSS/BLUE SHIELD | Admitting: Family Medicine

## 2018-05-04 ENCOUNTER — Encounter

## 2018-05-08 ENCOUNTER — Encounter: Payer: Self-pay | Admitting: *Deleted

## 2018-05-08 ENCOUNTER — Other Ambulatory Visit: Payer: Self-pay | Admitting: *Deleted

## 2018-05-08 ENCOUNTER — Telehealth: Payer: Self-pay | Admitting: Hospice and Palliative Medicine

## 2018-05-08 ENCOUNTER — Encounter: Payer: Self-pay | Admitting: Oncology

## 2018-05-08 MED ORDER — METRONIDAZOLE-CLEANSER 0.75 % CREAM EX KIT: 1.0000 "application " | PACK | Freq: Two times a day (BID) | CUTANEOUS | 0 refills | Status: AC

## 2018-05-08 MED ORDER — CYCLOBENZAPRINE HCL 5 MG PO TABS: 5.0000 mg | ORAL_TABLET | Freq: Every day | ORAL | 0 refills | Status: AC

## 2018-05-08 NOTE — Telephone Encounter (Signed)
I spoke with patient by phone.  She reports generally doing well in regards to pain and nausea.  However, she has had progressive weakness.  Patient now having difficulty standing and ambulating.  She is being followed at home by hospice.  She may benefit from a hospital bed.  Patient has had chronic TMJ previously managed with an occlusal splint.  However she can no longer wear it due to exacerbation of nausea as it triggers her gag reflex.  Will start patient on Flexeril 5 mg (1 to 2 tablets) nightly.  Patient is already taking steroids twice daily.  She probably would not tolerate NSAIDs given nausea.  If patient does not respond well to the cyclobenzaprine we could alternatively try clonazepam or amitriptyline.  Patient also is continuing to have facial rash and no longer wants to use the clindamycin gel given its greasy feeling.  Etiology of rash is unclear.  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  Will switch clindamycin gel to metronidazole cream 0.75%.

## 2018-05-10 ENCOUNTER — Encounter: Payer: Self-pay | Admitting: Oncology

## 2018-05-11 ENCOUNTER — Other Ambulatory Visit: Payer: Self-pay | Admitting: Hospice and Palliative Medicine

## 2018-05-11 ENCOUNTER — Telehealth: Payer: Self-pay | Admitting: Hospice and Palliative Medicine

## 2018-05-11 ENCOUNTER — Telehealth: Payer: Self-pay | Admitting: *Deleted

## 2018-05-11 MED ORDER — AMOXICILLIN-POT CLAVULANATE 875-125 MG PO TABS: 1.0000 | ORAL_TABLET | Freq: Two times a day (BID) | ORAL | 0 refills | Status: AC

## 2018-05-11 NOTE — Telephone Encounter (Signed)
I spoke with the patient this AM by phone. I am happy to talk with the nurse. Do you have a number?

## 2018-05-11 NOTE — Telephone Encounter (Signed)
Thank you Josh.

## 2018-05-11 NOTE — Progress Notes (Signed)
Will start Augmentin 875mg  BID x 10 days

## 2018-05-11 NOTE — Telephone Encounter (Signed)
Received message from patient that her jaw pain has persisted.  I called and spoke with patient about this issue.  She says that she has a long history of TMJ pain.  She has been unable to use her dental splint due to gagging.  She feels the right side is worse than the left.  She denies any oral abscesses or dental pain.  She denies fever or chills.  She denies known trauma but did fall the other day and is not entirely sure if she could have "jarred" her jaw.  She says her jaw is tender to touch externally.  She is using Flexeril 5 mg nightly.  However, this has not yet been efficacious.  Discussed increasing it to 2 tablets at bedtime.  Patient can also take it up to 3 times daily if needed.  We also discussed acetaminophen/ibuprofen combo.  Patient continues to take dexamethasone.  Patient can also use ice as needed.  Patient to call if pain or symptoms worsen or do not improve.  I also encouraged her to have the hospice nurse look at her jaw and call if needed.

## 2018-05-11 NOTE — Telephone Encounter (Signed)
Josh, the number is at the bottom of the note [phone], Vivien Rota

## 2018-05-11 NOTE — Telephone Encounter (Addendum)
Hospice nurse Vivien Rota called to report that patient is having pain, swelling  And possible infection at her TMJ site, this was discussed with J Borders at her last appointment. She is not running fever and is using Flexeril and Tylenol for the pain. Requesting an antibiotic be ordered. Please advise

## 2018-05-18 ENCOUNTER — Telehealth: Payer: Self-pay | Admitting: *Deleted

## 2018-05-18 NOTE — Telephone Encounter (Signed)
Prn yes.

## 2018-05-18 NOTE — Telephone Encounter (Signed)
Hospice nurse Vivien Rota called to report that patient has edema in her lower extremities and wants to know if she can start her on Lasix Please advise

## 2018-05-18 NOTE — Telephone Encounter (Signed)
Valerie Horton informed of physician response and repeated back to me

## 2018-05-21 ENCOUNTER — Encounter: Payer: Self-pay | Admitting: Oncology

## 2018-05-22 ENCOUNTER — Encounter: Payer: Self-pay | Admitting: *Deleted

## 2018-05-23 ENCOUNTER — Other Ambulatory Visit: Payer: Self-pay | Admitting: *Deleted

## 2018-05-23 MED ORDER — FENTANYL 50 MCG/HR TD PT72: 50.0000 ug | MEDICATED_PATCH | TRANSDERMAL | 0 refills | Status: DC

## 2018-05-25 ENCOUNTER — Other Ambulatory Visit: Payer: Self-pay | Admitting: Oncology

## 2018-05-28 ENCOUNTER — Encounter: Payer: Self-pay | Admitting: Oncology

## 2018-05-30 ENCOUNTER — Other Ambulatory Visit: Payer: Self-pay | Admitting: Hospice and Palliative Medicine

## 2018-05-30 MED ORDER — FENTANYL 75 MCG/HR TD PT72: 75.0000 ug | MEDICATED_PATCH | TRANSDERMAL | 0 refills | Status: DC

## 2018-05-30 MED ORDER — OXYCODONE HCL 10 MG PO TABS: 10.0000 mg | ORAL_TABLET | ORAL | 0 refills | Status: DC | PRN

## 2018-05-30 NOTE — Progress Notes (Signed)
Message received from hospice nurse requesting increased fentanyl dosing due to abdominal pain. I spoke with nurse - Vivien Rota by phone [phone]) Patient is taking oxycodone 10mg  q4h without any reported adverse effects. She has been stable on dose of fentanyl for some weeks. Will increase fentanyl to 11mcg q72h and patient may take oxycodone 1-2 tablets q4h prn. She may also need repeat paracentesis. However, nurse did not feel abd distension was markedly worse. Patient is having BMs.

## 2018-06-04 ENCOUNTER — Telehealth: Payer: Self-pay | Admitting: *Deleted

## 2018-06-04 ENCOUNTER — Telehealth: Payer: Self-pay | Admitting: Hospice and Palliative Medicine

## 2018-06-04 NOTE — Telephone Encounter (Signed)
Spoke to [employer], NP this morning regarding need for paracentensis this week. Vivien Rota, Hospice RN notified of patients appointment for paracentesis tomorrow. Patient to arrive at 1:00 for 1:30 procedure tomorrow, 12/3. Vivien Rota will notify patient of appointment.

## 2018-06-04 NOTE — Telephone Encounter (Signed)
I received a call from patient's hospice nurse, Vivien Rota, at [phone]. She says that patient has had worsening abd distension over the weekend and was asking about a paracentesis. Will pursue scheduling of a therapeutic paracentesis.

## 2018-06-05 ENCOUNTER — Ambulatory Visit
Admission: RE | Admit: 2018-06-05 | Discharge: 2018-06-05 | Disposition: A | Discharge status: Home / Self Care | Payer: BLUE CROSS/BLUE SHIELD | Source: Ambulatory Visit | Attending: Family Medicine | Admitting: Family Medicine

## 2018-06-05 DIAGNOSIS — R188 Other ascites: Secondary | ICD-10-CM | POA: Insufficient documentation

## 2018-06-13 ENCOUNTER — Other Ambulatory Visit: Payer: Self-pay | Admitting: *Deleted

## 2018-06-13 MED ORDER — FENTANYL 75 MCG/HR TD PT72: 75.0000 ug | MEDICATED_PATCH | TRANSDERMAL | 0 refills | Status: DC

## 2018-06-13 MED ORDER — OXYCODONE HCL 10 MG PO TABS: 10.0000 mg | ORAL_TABLET | ORAL | 0 refills | Status: AC | PRN

## 2018-06-18 ENCOUNTER — Inpatient Hospital Stay: Payer: BLUE CROSS/BLUE SHIELD | Admitting: Nurse Practitioner

## 2018-06-22 ENCOUNTER — Other Ambulatory Visit: Payer: Self-pay | Admitting: *Deleted

## 2018-06-22 MED ORDER — FENTANYL 75 MCG/HR TD PT72: 75.0000 ug | MEDICATED_PATCH | TRANSDERMAL | 0 refills | Status: AC

## 2018-06-28 ENCOUNTER — Other Ambulatory Visit: Payer: Self-pay | Admitting: *Deleted

## 2018-06-28 NOTE — Telephone Encounter (Signed)
Hospice nurse called asking for refill of Fentanyl 75, this was filled 12/20 for # 5 patches so the patient should still have 3 patches left. I put a call into [facility] to clarify.

## 2018-06-28 NOTE — Telephone Encounter (Signed)
I called [facility] and was told that the prescription from 12/20 is sitting in the waiting bin to be picked up

## 2018-06-28 NOTE — Telephone Encounter (Signed)
Okay. Thank you.

## 2018-07-01 ENCOUNTER — Encounter: Payer: Self-pay | Admitting: Oncology

## 2018-07-04 DEATH — deceased

## 2018-07-09 ENCOUNTER — Encounter: Payer: Self-pay | Admitting: Oncology
